# Patient Record
Sex: Male | Born: 1950 | Race: White | Hispanic: No | Marital: Married | Smoking: Former smoker
Health system: Southern US, Community
[De-identification: ages and names within clinical notes are randomized; demographics above are authoritative.]

## PROBLEM LIST (undated history)

## (undated) DIAGNOSIS — I219 Acute myocardial infarction, unspecified: Secondary | ICD-10-CM

## (undated) DIAGNOSIS — J40 Bronchitis, not specified as acute or chronic: Secondary | ICD-10-CM

## (undated) DIAGNOSIS — G8929 Other chronic pain: Secondary | ICD-10-CM

## (undated) DIAGNOSIS — D369 Benign neoplasm, unspecified site: Secondary | ICD-10-CM

## (undated) DIAGNOSIS — N201 Calculus of ureter: Secondary | ICD-10-CM

## (undated) DIAGNOSIS — M479 Spondylosis, unspecified: Secondary | ICD-10-CM

## (undated) DIAGNOSIS — M549 Dorsalgia, unspecified: Secondary | ICD-10-CM

## (undated) DIAGNOSIS — IMO0001 Reserved for inherently not codable concepts without codable children: Secondary | ICD-10-CM

## (undated) DIAGNOSIS — G459 Transient cerebral ischemic attack, unspecified: Secondary | ICD-10-CM

## (undated) DIAGNOSIS — K648 Other hemorrhoids: Secondary | ICD-10-CM

## (undated) DIAGNOSIS — I10 Essential (primary) hypertension: Secondary | ICD-10-CM

## (undated) DIAGNOSIS — I2510 Atherosclerotic heart disease of native coronary artery without angina pectoris: Secondary | ICD-10-CM

## (undated) DIAGNOSIS — K439 Ventral hernia without obstruction or gangrene: Secondary | ICD-10-CM

## (undated) DIAGNOSIS — E785 Hyperlipidemia, unspecified: Secondary | ICD-10-CM

## (undated) DIAGNOSIS — M8580 Other specified disorders of bone density and structure, unspecified site: Secondary | ICD-10-CM

## (undated) DIAGNOSIS — K219 Gastro-esophageal reflux disease without esophagitis: Secondary | ICD-10-CM

## (undated) HISTORY — DX: Hyperlipidemia, unspecified: E78.5

## (undated) HISTORY — PX: BACK SURGERY: SHX140

## (undated) HISTORY — DX: Other specified disorders of bone density and structure, unspecified site: M85.80

## (undated) HISTORY — DX: Essential (primary) hypertension: I10

## (undated) HISTORY — DX: Benign neoplasm, unspecified site: D36.9

## (undated) HISTORY — DX: Other hemorrhoids: K64.8

## (undated) HISTORY — DX: Gastro-esophageal reflux disease without esophagitis: K21.9

---

## 1998-08-17 ENCOUNTER — Ambulatory Visit (HOSPITAL_COMMUNITY): Admission: RE | Admit: 1998-08-17 | Discharge: 1998-08-17 | Payer: Self-pay | Admitting: *Deleted

## 2000-07-30 ENCOUNTER — Encounter: Payer: Self-pay | Admitting: Neurosurgery

## 2000-07-31 ENCOUNTER — Inpatient Hospital Stay (HOSPITAL_COMMUNITY): Admission: AD | Admit: 2000-07-31 | Discharge: 2000-08-01 | Payer: Self-pay | Admitting: Neurosurgery

## 2001-05-19 ENCOUNTER — Encounter: Payer: Self-pay | Admitting: Internal Medicine

## 2001-05-19 ENCOUNTER — Encounter: Admission: RE | Admit: 2001-05-19 | Discharge: 2001-05-19 | Payer: Self-pay | Admitting: Internal Medicine

## 2001-06-17 ENCOUNTER — Encounter: Payer: Self-pay | Admitting: Internal Medicine

## 2001-06-17 ENCOUNTER — Encounter: Admission: RE | Admit: 2001-06-17 | Discharge: 2001-06-17 | Payer: Self-pay | Admitting: Internal Medicine

## 2002-10-07 ENCOUNTER — Encounter: Payer: Self-pay | Admitting: Internal Medicine

## 2002-10-07 ENCOUNTER — Encounter: Admission: RE | Admit: 2002-10-07 | Discharge: 2002-10-07 | Payer: Self-pay | Admitting: Internal Medicine

## 2003-12-09 ENCOUNTER — Ambulatory Visit (HOSPITAL_COMMUNITY): Admission: RE | Admit: 2003-12-09 | Discharge: 2003-12-09 | Payer: Self-pay | Admitting: *Deleted

## 2003-12-09 ENCOUNTER — Encounter (INDEPENDENT_AMBULATORY_CARE_PROVIDER_SITE_OTHER): Payer: Self-pay | Admitting: *Deleted

## 2003-12-30 ENCOUNTER — Ambulatory Visit (HOSPITAL_COMMUNITY): Admission: RE | Admit: 2003-12-30 | Discharge: 2003-12-30 | Payer: Self-pay | Admitting: General Surgery

## 2004-01-19 ENCOUNTER — Emergency Department (HOSPITAL_COMMUNITY): Admission: EM | Admit: 2004-01-19 | Discharge: 2004-01-19 | Payer: Self-pay | Admitting: Emergency Medicine

## 2006-05-10 ENCOUNTER — Ambulatory Visit: Payer: Self-pay | Admitting: Cardiovascular Disease

## 2006-05-21 DIAGNOSIS — G459 Transient cerebral ischemic attack, unspecified: Secondary | ICD-10-CM

## 2006-05-21 HISTORY — DX: Transient cerebral ischemic attack, unspecified: G45.9

## 2006-07-08 ENCOUNTER — Ambulatory Visit: Payer: Self-pay | Admitting: Cardiovascular Disease

## 2006-09-19 ENCOUNTER — Ambulatory Visit: Payer: Self-pay | Admitting: Cardiovascular Disease

## 2007-02-01 ENCOUNTER — Encounter: Admission: RE | Admit: 2007-02-01 | Discharge: 2007-02-01 | Payer: Self-pay | Admitting: Internal Medicine

## 2007-05-01 ENCOUNTER — Observation Stay (HOSPITAL_COMMUNITY): Admission: EM | Admit: 2007-05-01 | Discharge: 2007-05-02 | Payer: Self-pay | Admitting: Emergency Medicine

## 2007-05-01 ENCOUNTER — Ambulatory Visit: Payer: Self-pay | Admitting: Vascular Surgery

## 2007-05-01 ENCOUNTER — Encounter (INDEPENDENT_AMBULATORY_CARE_PROVIDER_SITE_OTHER): Payer: Self-pay | Admitting: Internal Medicine

## 2007-07-16 ENCOUNTER — Ambulatory Visit: Payer: Self-pay | Admitting: Cardiovascular Disease

## 2007-07-24 ENCOUNTER — Ambulatory Visit: Payer: Self-pay | Admitting: Cardiovascular Disease

## 2007-07-24 ENCOUNTER — Ambulatory Visit (HOSPITAL_COMMUNITY): Admission: RE | Admit: 2007-07-24 | Discharge: 2007-07-24 | Payer: Self-pay | Admitting: Cardiovascular Disease

## 2008-07-13 ENCOUNTER — Inpatient Hospital Stay (HOSPITAL_COMMUNITY): Admission: RE | Admit: 2008-07-13 | Discharge: 2008-07-16 | Payer: Self-pay | Admitting: Neurosurgery

## 2008-07-29 ENCOUNTER — Ambulatory Visit: Payer: Self-pay | Admitting: Internal Medicine

## 2008-11-05 ENCOUNTER — Ambulatory Visit: Payer: Self-pay | Admitting: Internal Medicine

## 2008-11-30 DIAGNOSIS — E785 Hyperlipidemia, unspecified: Secondary | ICD-10-CM | POA: Insufficient documentation

## 2008-11-30 DIAGNOSIS — M1990 Unspecified osteoarthritis, unspecified site: Secondary | ICD-10-CM | POA: Insufficient documentation

## 2008-11-30 DIAGNOSIS — I10 Essential (primary) hypertension: Secondary | ICD-10-CM | POA: Insufficient documentation

## 2008-11-30 DIAGNOSIS — E7800 Pure hypercholesterolemia, unspecified: Secondary | ICD-10-CM | POA: Insufficient documentation

## 2008-12-01 ENCOUNTER — Ambulatory Visit: Payer: Self-pay | Admitting: Cardiovascular Disease

## 2008-12-02 ENCOUNTER — Encounter: Payer: Self-pay | Admitting: Cardiovascular Disease

## 2008-12-03 ENCOUNTER — Ambulatory Visit: Payer: Self-pay | Admitting: Internal Medicine

## 2009-02-07 ENCOUNTER — Telehealth: Payer: Self-pay | Admitting: Cardiovascular Disease

## 2009-03-04 ENCOUNTER — Encounter: Admission: RE | Admit: 2009-03-04 | Discharge: 2009-03-04 | Payer: Self-pay | Admitting: Internal Medicine

## 2009-06-14 ENCOUNTER — Ambulatory Visit: Payer: Self-pay | Admitting: Internal Medicine

## 2009-10-06 ENCOUNTER — Encounter (INDEPENDENT_AMBULATORY_CARE_PROVIDER_SITE_OTHER): Payer: Self-pay | Admitting: *Deleted

## 2010-01-11 ENCOUNTER — Ambulatory Visit: Payer: Self-pay | Admitting: Cardiovascular Disease

## 2010-01-11 DIAGNOSIS — R41840 Attention and concentration deficit: Secondary | ICD-10-CM | POA: Insufficient documentation

## 2010-06-18 LAB — CONVERTED CEMR LAB
ALT: 32 units/L (ref 0–53)
Albumin: 4.3 g/dL (ref 3.5–5.2)
Basophils Absolute: 0 10*3/uL (ref 0.0–0.1)
Basophils Relative: 0.5 % (ref 0.0–3.0)
Bilirubin, Direct: 0.2 mg/dL (ref 0.0–0.3)
CO2: 28 meq/L (ref 19–32)
Calcium: 9.5 mg/dL (ref 8.4–10.5)
Eosinophils Relative: 2.8 % (ref 0.0–5.0)
HCT: 40 % (ref 39.0–52.0)
HDL: 38.4 mg/dL — ABNORMAL LOW (ref >39.00)
LDL Cholesterol: 69 mg/dL (ref 0–99)
MCHC: 34 g/dL (ref 30.0–36.0)
Monocytes Relative: 12.1 % — ABNORMAL HIGH (ref 3.0–12.0)
Neutrophils Relative %: 48 % (ref 43.0–77.0)
PSA: 0.51 ng/mL (ref 0.10–4.00)
RBC: 4.07 M/uL — ABNORMAL LOW (ref 4.22–5.81)
Sodium: 140 meq/L (ref 135–145)
Total CHOL/HDL Ratio: 4
WBC: 6.2 10*3/uL (ref 4.5–10.5)

## 2010-06-20 NOTE — Letter (Signed)
Summary: Appointment - Reminder 2  [employer], Main Office  [address]   [place], [place] [address]   Phone: [phone]  Fax: [phone]     Oct 06, 2009 MRN: [id number]   Johnny [name] [address] [place], [place]  [address]   Dear [name],  Our records indicate that it is time to schedule a follow-up appointment with Dr. Eden Emms. It is very important that we reach you to schedule this appointment. We look forward to participating in your health care needs. Please contact us at the number listed above at your earliest convenience to schedule your appointment.  If you are unable to make an appointment at this time, give [place] a call so we can update our records.     Sincerely,  Migdalia Dk [facility] Scheduling Team

## 2010-06-20 NOTE — Assessment & Plan Note (Signed)
Summary: per check out/sf  Medications Added CELEXA 20 MG TABS (CITALOPRAM HYDROBROMIDE) 1 tab by mouth once daily TESTIM 1 % GEL (TESTOSTERONE) as directed MULTIVITAMINS   TABS (MULTIPLE VITAMIN) 1 tab by mouth once daily B-COMPLEX/B-12  LIQD (B COMPLEX VITAMINS) daliy ACTONEL 150 MG TABS (RISEDRONATE SODIUM) monthly      Allergies Added: NKDA  Primary Provider:  Sharlet Salina MD  CC:  check up.  History of Present Illness: Johnny [name] seen today in followup for hypertension and hypercholesterolemia.  Previous question of a TIA.  I did a transesophageal echocardiogram on him in March of 2009 there was no PFO.  He septally saw Dr. 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  The patient followed up with Dr. Channing Mutters and had his lower back surgery.  Seems to have helped him.  He has been compliant with his blood pressure and cholesterol pills.  He has not had any significant chest pain PND or orthopnea.  There is no documented coronary artery disease.  His weight has been stable.  His primary care physician is Dr. Lenord [name].  He requested lab work today since he is fasting.  We discussed a low carb diet.  He seems to think he has ADD.  We discussed Ritalin and I advised him to be formerly tested by a psychologist and if he needs meds a nonstimulant alternative would be better.  I congratulated him on his sons graduation from Medical laboratory scientific officer ac[employer].  Current Problems (verified): 1)  Degenerative Joint Disease  (ICD-715.90) 2)  Dyslipidemia  (ICD-272.4) 3)  Tia  (ICD-435.9) 4)  Hypercholesterolemia  (ICD-272.0) 5)  Hypertension  (ICD-401.9)  Current Medications (verified): 1)  Crestor 10 Mg Tabs (Rosuvastatin Calcium) .... Take One Tablet By Mouth Daily. 2)  Hyzaar 100-12.5 Mg Tabs (Losartan Potassium-Hctz) .Marland Kitchen.. 1 Tab By Mouth Once Daily 3)  Neurontin 600 Mg Tabs (Gabapentin) .... As Needed 4)  Aspirin Ec 325 Mg Tbec (Aspirin) .... Take One Tablet By Mouth Daily 5)  Percocet  10-325 Mg Tabs (Oxycodone-Acetaminophen) .... As Needed 6)  Flexeril 10 Mg Tabs (Cyclobenzaprine Hcl) .... As Needed 7)  Celexa 20 Mg Tabs (Citalopram Hydrobromide) .Marland Kitchen.. 1 Tab By Mouth Once Daily 8)  Testim 1 % Gel (Testosterone) .... As Directed 9)  Multivitamins   Tabs (Multiple Vitamin) .Marland Kitchen.. 1 Tab By Mouth Once Daily 10)  B-Complex/b-12  Liqd (B Complex Vitamins) .... Daliy 11)  Actonel 150 Mg Tabs (Risedronate Sodium) .... Monthly  Allergies (verified): No Known Drug Allergies  Past History:  Past Medical History: Last updated: 11/30/2008 Current Problems:  DEGENERATIVE JOINT DISEASE (ICD-715.90) DYSLIPIDEMIA (ICD-272.4) TIA (ICD-435.9) HYPERCHOLESTEROLEMIA (ICD-272.0) HYPERTENSION (ICD-401.9)  Past Surgical History: Last updated: 11/30/2008 fusion oflumbar spine   Family History: Last updated: 12/01/2008 non-contriibutory  Social History: Last updated: 11/30/2008  He lives with his wife and two children, a son and a   daughter.  He used to smoke but quit more than 30 years ago.  He drinks   alcohol occasionally.  He retired from the [employer] two weeks   ago.  Patient denies any history of recent traveling.   Review of Systems       Denies fever, malais, weight loss, blurry vision, decreased visual acuity, cough, sputum, SOB, hemoptysis, pleuritic pain, palpitaitons, heartburn, abdominal pain, melena, lower extremity edema, claudication, or rash.   Vital Signs:  Patient profile:   60 year old male Height:      72 inches Weight:  229 pounds BMI:     31.17 Pulse rate:   67 / minute Resp:     14 per minute BP sitting:   126 / 80  (left arm)  Vitals Entered By: Kem Parkinson (January 11, 2010 8:50 AM)  Physical Exam  General:  Affect appropriate Healthy:  appears stated age HEENT: normal Neck supple with no adenopathy JVP normal no bruits no thyromegaly Lungs clear with no wheezing and good diaphragmatic motion Heart:  S1/S2 no murmur,rub,  gallop or click PMI normal Abdomen: benighn, BS positve, no tenderness, no AAA no bruit.  No HSM or HJR Distal pulses intact with no bruits No edema Neuro non-focal Skin warm and dry    Impression & Recommendations:  Problem # 1:  DYSLIPIDEMIA (ICD-272.4)  Continue statin labs today His updated medication list for this problem includes:    Crestor 10 Mg Tabs (Rosuvastatin calcium) .Marland Kitchen... Take one tablet by mouth daily.  Orders: TLB-Lipid Panel (80061-LIPID)  His updated medication list for this problem includes:    Crestor 10 Mg Tabs (Rosuvastatin calcium) .Marland Kitchen... Take one tablet by mouth daily.  Problem # 2:  TIA (ICD-435.9) No recurrence ? complex migraine.  Continue ASA  Problem # 3:  HYPERTENSION (ICD-401.9)  Well controlled continue low sodium diet His updated medication list for this problem includes:    Hyzaar 100-12.5 Mg Tabs (Losartan potassium-hctz) .Marland Kitchen... 1 tab by mouth once daily    Aspirin Ec 325 Mg Tbec (Aspirin) .Marland Kitchen... Take one tablet by mouth daily  Orders: TLB-BMP (Basic Metabolic Panel-BMET) (80048-METABOL) TLB-CBC Platelet - w/Differential (85025-CBCD) TLB-Hepatic/Liver Function Pnl (80076-HEPATIC) TLB-A1C / Hgb A1C (Glycohemoglobin) (83036-A1C) TLB-PSA (Prostate Specific Antigen) (84153-PSA)  His updated medication list for this problem includes:    Hyzaar 100-12.5 Mg Tabs (Losartan potassium-hctz) .Marland Kitchen... 1 tab by mouth once daily    Aspirin Ec 325 Mg Tbec (Aspirin) .Marland Kitchen... Take one tablet by mouth daily  Problem # 4:  ATTENTION OR CONCENTRATION DEFICIT (ICD-799.51) Advised him to see his wife's psychologist. (She has MS)  Would not advise ritalin given Rx for BP  Patient Instructions: 1)  Your physician recommends that you schedule a follow-up appointment in: one year 2)  Your physician recommends that you return for lab work in:CBC,CMET,LFT'S,LIPIDS,PSA,HgbA1c   EKG Report  Procedure date:  01/11/2010  Findings:      NSR 67 Normal ECG

## 2010-09-05 LAB — DIFFERENTIAL
Basophils Absolute: 0.1 10*3/uL (ref 0.0–0.1)
Eosinophils Absolute: 0.1 10*3/uL (ref 0.0–0.7)
Lymphs Abs: 2.4 10*3/uL (ref 0.7–4.0)
Neutrophils Relative %: 55 % (ref 43–77)

## 2010-09-05 LAB — COMPREHENSIVE METABOLIC PANEL
ALT: 36 U/L (ref 0–53)
AST: 26 U/L (ref 0–37)
BUN: 9 mg/dL (ref 6–23)
Calcium: 10.4 mg/dL (ref 8.4–10.5)
Chloride: 107 mEq/L (ref 96–112)
GFR calc non Af Amer: >60 mL/min (ref >60)
Potassium: 4.2 mEq/L (ref 3.5–5.1)
Total Bilirubin: 1 mg/dL (ref 0.3–1.2)

## 2010-09-05 LAB — TYPE AND SCREEN
ABO/RH(D): B POS
Antibody Screen: NEGATIVE

## 2010-09-05 LAB — URINALYSIS, ROUTINE W REFLEX MICROSCOPIC
Glucose, UA: NEGATIVE mg/dL
Nitrite: NEGATIVE
Specific Gravity, Urine: 1.016 (ref 1.005–1.030)
pH: 7 (ref 5.0–8.0)

## 2010-09-05 LAB — PROTIME-INR
INR: 0.9 (ref 0.00–1.49)
Prothrombin Time: 12.6 seconds (ref 11.6–15.2)

## 2010-09-05 LAB — ABO/RH: ABO/RH(D): B POS

## 2010-09-05 LAB — CBC
HCT: 46 % (ref 39.0–52.0)
RBC: 4.8 MIL/uL (ref 4.22–5.81)
RDW: 13.1 % (ref 11.5–15.5)
WBC: 7.4 10*3/uL (ref 4.0–10.5)

## 2010-10-03 NOTE — Discharge Summary (Signed)
Johnny [name], Johnny [name]              ACCOUNT NO.:  [id number]   MEDICAL RECORD NO.:  [id number]          PATIENT TYPE:  INP   LOCATION:  3030                         FACILITY:  [facility]   PHYSICIAN:  Isidor Holts, M.D.  DATE OF BIRTH:  12-06-1950   DATE OF ADMISSION:  04/30/2007  DATE OF DISCHARGE:  05/02/2007                               DISCHARGE SUMMARY   PRIMARY CARE PHYSICIAN:  Luanna Cole. Lenord Fellers, MD.   DISCHARGE DIAGNOSES:  1. Transient ischemic attack.  2. Hypertension.  3. Dyslipidemia.  4. Degenerative joint disease, lumbar spine.   DISCHARGE MEDICATIONS:  1. Crestor 20 mg p.o. daily.  2. Aspirin 325 mg p.o. daily (Was on 81 mg p.o. daily).  3. Hyzaar (100/12.5) one p.o. daily.  4. Ibuprofen 800 mg p.o. p.r.n. q.6h.  5. Diazepam 5 to 10 mg p.o. p.r.n. q.h.s.   PROCEDURES:  1. Head CT scan dated April 30, 2007:  This showed no evidence of      acute intracranial abnormality.  There was mild generalized      cerebral and cerebellar atrophy.  2. Brain MRI dated May 01, 2007:  This showed no acute      intracranial abnormality.  3. Brain/neck MRA dated May 01, 2007:  This showed no variant MRA      circle of Willis, minimal atherosclerotic irregularity, the      proximal left internal carotid artery.  This had significant      stenosis.  Otherwise, unremarkable MRA of neck.  4. Carotid/vertebral duplex scan dated May 01, 2007:  This showed      no evidence of significant plaque bilaterally, high bifurcation,      vertebral arteries low antegrade bilaterally, no significant right      ICD stenosis noted, no significant left ICD stenosis noted.  5. Two-D echocardiogram dated May 01, 2007:  This showed overall      normal left ventricular systolic function, EF 55% to 60%, no left      ventricular regional wall motion abnormalities, doppler parameters      were consistent with normal left ventricular relaxation, aortic      valve was mildly increased,  there was no echocardiographic evidence      for intracardiac source of embolism.  6. EEG dated May 02, 2007:  This was a normal EEG in the waking      state. No evidence of ictal or interictal discharges were recorded.   CONSULTATIONS:  None.   ADMISSION HISTORY:  As in the H&P notes of April 30, 2007, dictated  by Dr. Ebony Cargo.  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  He was admitted for further evaluation, investigation, and  management.   CLINICAL COURSE:  1. TIA.  The patient presents with transient focal neurologic      phenomena as outlined above.  At the time of evaluation in the  emergency department, symptoms had resolved.  The patient was      admitted for TIA/CVA workup, which included head CT scan, brain      MRI/MRA, bilateral carotid/vertebral artery duplex scans, two-D      echocardiogram, lipid profile, homocystine levels.  ESR was normal      at 3 mm per hour.  For details of findings, refer to procedure list      above; however, essentially his tests were all negative.  The      patient did experience a further episode of transient dysphasia on      emerging from the MRI Department, however, in the past 24 hours      following this, no further episodes were recorded.  Pre-admission,      he was on 81 mg daily of Aspirin.  This has been increased to 325      mg daily.  EEG was done on May 02, 2007, although his symptoms      were inconsistent with seizure disorder.  EEG was negative.   1. Dyslipidemia.  The patient's lipid profile was as follows:  Total      cholesterol 156, triglycerides 162, HDL 36, LDL 88, i.e., excellent      lipid profile.  The patient has been reassured accordingly and has      been  recommended to continue his current dose of Statin.      Incidentally, the TSH was normal at 0.637.   1. Hypertension.  The patient remained normotensive throughout the      course of his hospitalization.  As a matter of fact, on May 02, 2007, BP was 128/73 mmHg.   1. DJD, lumbar spine.  The patient has reportedly been off Aspirin for      a few days, because of scheduled epidural injection, which was to      be done on May 01, 2007, by Dr. Trey Sailors.  Unfortunately, this      procedure had to be postponed secondary to current hospitalization      and the need for patient to be back on Aspirin.  Back pain,      however, did not prove problematic, during the course of this      hospitalization.  We anticipate that the patient will follow up      routinely with Dr. Channing Mutters, following discharge.   DISPOSITION:  The patient on May 02, 2007, considered clinically  stable for discharge and has been discharged accordingly.   DIET:  A heart healthy diet.   ACTIVITY:  As tolerated.   FOLLOWUP INSTRUCTIONS:  The patient is to recommended to follow up with  Dr. Luanna Cole. Baxley, primary MD, within one week of discharge.  He has  been instructed to call for an appointment.      Isidor Holts, M.D.  Electronically Signed     CO/MEDQ  D:  05/02/2007  T:  05/03/2007  Job:  [id number]   cc:   Luanna Cole. Lenord Fellers, M.D.  Johnny [name], M.D.

## 2010-10-03 NOTE — Assessment & Plan Note (Signed)
Johnny [name]                            CARDIOLOGY OFFICE NOTE   Johnny [name], Johnny [name]                     MRN:          [id number]  DATE:07/16/2007                            DOB:          [birth year]    Johnny [name] returns today for follow-up of hypertension and chest pain.  In  the past has also had hypercholesterolemia.   The patient has been through quite a lot since I last saw him in May.  He initially had disk problems in his back that he has seen Dr. Channing [name] for.  He had some steroid injections for this with some improvement.  About 10  days after second injection,  he had a fairly significant TIA like  symptoms.  Read through all his hospital notes from [facility].  He had  scintillations in his left eye.  There is a question of a complex  migraine.  He went to [facility] to see his daughter, who just had  a baby, and subsequently was transferred to [facility].  He had an extensive  workup and was seen by neurology.  His CT and MRA were essentially  unremarkable.  He had normal carotids.  He did not have a TEE or  transcranial Dopplers that I could see.  His blood work was normal with  a normal sed rate and he has a normal EEG   In talking to the patient, he had no other problems.  He seems to have  resolved back to normal.  There was no difficulty moving his arms and  legs.  His speech may have been off a little bit.  Total duration of the  episode was less than an hour.   He had been off his aspirin for 10 days prior to it.  He was discharged  without medication changes.  His last LDL cholesterol was 73 with diet  therapy.  He was on blood pressure medicine and was not sent home on  Aggrenox or Plavix.   His review of systems is remarkable for no recurrent TIA or CVAs and  improvement in his lower back pain.  Otherwise negative.   He is on Hyzaar 100/12.5 a day, aspirin a day, multivitamins.   EXAM:  Remarkable for an overweight but  healthy-appearing middle-aged  white male in no distress.  Weight is 235, blood pressure 135/84, pulse 89 regular, afebrile,  respiratory rate 14.  HEENT:  Unremarkable.  Carotids are without bruit, no lymphadenopathy, thyromegaly JVP  elevation.  Pupils were equal, round, reactive to light and accommodation.  There is  no drift no nystagmus.  Neck is supple.  No lymphadenopathy, thyromegaly JVP elevation.  LUNGS:  Clear diaphragmatic motion.  No wheezing.  S1-S2 normal heart sounds PMI normal.  ABDOMEN:  Benign.  Bowel sounds positive.  No bruit, no AAA.  No  hepatosplenomegaly or hepatojugular reflux.  Distal pulse intact, no edema.  NEURO:  Nonfocal.  SKIN:  Warm and dry.   EKG is normal sinus rhythm with isolated Q wave in lead III.   IMPRESSION:  1. Question TIA versus complex migraine the patient should have  a TE      to rule out PFO right-to-left shunt.  We will arrange this as an      outpatient.  Apparently Dr. Pearlean [name] did not think Aggrenox was in      order.  The patient did not have any palpitations or irregular      heartbeats noticed during the time of this and did not do not think      that paroxysmal atrial fibrillation had a role here.  Do not see      any indication for Coumadin.  2. Hypertension currently well controlled.  Continue low-salt diet.      Hyzaar current dose seems to well-controlled.  3. Previous hypercholesterolemia seems improved with diet therapy.  No      indication for statin drug.  May be worthwhile to check a C-      reactive protein and particle sizes with lipomet in the future to      make sure he does not need lipid therapy in terms of a possible      vascular disease with a recent TIA.  Further recommendations based      on results of his TEE.  4. Next back problems.  Follow-up with Dr. Channing [name].  Temporal relationship      between steroids actions and eye findings.  May need to be careful      with this in regards to future treatment.      Johnny Pick. Eden Emms, MD, [facility]  Electronically Signed    PCN/MedQ  DD: 07/16/2007  DT: 07/17/2007  Job #: [id number]

## 2010-10-03 NOTE — Op Note (Signed)
NAME:  Johnny [name], Johnny [name]              ACCOUNT NO.:  [id number]   MEDICAL RECORD NO.:  [id number]          PATIENT TYPE:  AMB   LOCATION:  ENDO                         FACILITY:  [facility]   PHYSICIAN:  Peter C. Eden Emms, MD, FACCDATE OF BIRTH:  1950/10/05   DATE OF PROCEDURE:  07/24/2007  DATE OF DISCHARGE:                               OPERATIVE REPORT   TRANSESOPHAGEAL ECHO:   INDICATION:  TIA, question complex migraine.   The patient received 100 mcg of fentanyl and 8 mg of Versed.  Using  digital technique, an Omniplane probe was advanced in the distal  esophagus without incident.  Transgastric imaging revealed normal LV  function.  The EF was 70%.  There was no regional wall motion  abnormality, no apical thrombus.  Mitral, aortic and tricuspid valves  are structurally normal.  There was trivial mitral insufficiency.  There  was no ASD or VSD.  Left atrium and right-sided cardiac chambers were  normal.  There was no left atrial appendage clot.  Bubble study was  negative for right-to-left shunt.  There was no left atrial appendage  thrombus and the aorta showed no significant debris.   IMPRESSION:  1. No source of embolus.  2. Negative bubble study.  3. No left atrial appendage thrombus.  4. No atrial septal defect or ventricular septal defect.  5. Normal left ventricular function, ejection fraction 70%.  6. Normal cardiac valves except for trivial mitral regurgitation.  7. No aortic debris.   The patient tolerated the procedure well.      [name], MD, [facility]  Electronically Signed     PCN/MEDQ  D:  07/24/2007  T:  07/24/2007  Job:  [id number]   cc:   Pramod P. Pearlean Brownie, MD

## 2010-10-03 NOTE — Discharge Summary (Signed)
NAMETRAJAN, GROVE NO.:  [id number]   MEDICAL RECORD NO.:  [id number]          PATIENT TYPE:  INP   LOCATION:  3033                         FACILITY:  [facility]   PHYSICIAN:  Payton Doughty, M.D.      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   PROCEDURES:  L4-L5 laminectomy, diskectomy, partial lumbar interbody  fusion, nonsegmental pedicle screws and posterolateral arthrodesis.   DICTATING DOCTOR:  Payton Doughty, MD., Neurosurgery.   COMPLICATIONS:  None.   DISCHARGE STATUS:  Alive and well.   BODY OF THE TEXT:  This is a 60 year old right-handed white gentleman  whose history and physical is recounted in the chart, had L4-L5 disk  disease for sometime and increasing pain, positive discography.  He was  admitted for fusion.   MEDICAL HISTORY:  Benign.  He has hypertension and hyperlipidemia.   ALLERGIES:  None.   PHYSICAL EXAMINATION:  GENERAL:  Intact.  NEUROLOGIC:  Intact.   He was admitted after ascertaining normal laboratory values and  underwent L4-L5 fusion.  Postoperatively, he has done well.  Foley was  removed on second postop day.  PCA was stopped on third postop day and  is being discharged home now.  He participated in physical therapy, to  good avail and is up and about.  His incisions are dry and well healing.  He is being discharged home in the care of his family.  Followup will in  the [facility] in about 10 days for suture removal.  He is being  discharged home on ciprofloxacin and Percocet.           ______________________________  Payton Doughty, M.D.     MWR/MEDQ  D:  07/16/2008  T:  07/17/2008  Job:  [id number]

## 2010-10-03 NOTE — Op Note (Signed)
NAMESTEPHON, Johnny [name] NO.:  [id number]   MEDICAL RECORD NO.:  [id number]          PATIENT TYPE:  INP   LOCATION:  3033                         FACILITY:  [facility]   PHYSICIAN:  Payton Doughty, M.D.      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   POSTOPERATIVE DIAGNOSIS:  Spondylosis at L4-5.   OPERATIVE PROCEDURES:  L4-5 laminectomy, diskectomy, posterior lumbar  antibody fusion with PEEK cages, nonsegmental pedicle screw fixation at  L4-5, and posterolateral arthrodesis L4-5.   SURGEON:  Payton Doughty, MD, Neurosurgery.   ANESTHESIA:  General endotracheal.   PREPARATION:  Prepped and draped with alcohol wipe.   COMPLICATIONS:  None.   NURSE ASSISTANT:  Kasik.   DOCTOR ASSISTANT:  Stefani Dama, MD.   BODY OF TEXT:  This is a 60 year old gentleman with probable recurrent  herniated disk in the lateral position at L4-5 on the right side.  Taken  to the operating room, smoothly anesthetized and intubated, placed prone  on the operating table following shave, prep, and drape in the usual  sterile fashion.  Skin was infiltrated with 1% lidocaine with 1:400,000  epinephrine and the skin was incised from the bottom of L3 to the top of  L5 and the lamina of L4 and the transverse process of L4 and L5 were  exposed bilaterally in subperiosteal plane.  Intraoperative x-ray  confirmed correctness of level.  The pars reticularis remaining lamina  inferior facet of L4 and superior facet of L5 was removed using the high-  speed drill and [place].  This allowed decompression of lateral  recesses, removal of ligamentum flavum, decompression of the neural  foramen, and removal of the disk at L4-5.  On the right side, he was  found to have a herniated disk in the foraminal position compressing the  right L4 root.  Following diskectomy and  complete decompression of the  nerve roots, PEEK cages 12-mm tall were packed with bone graft harvested  from the facet joints and after end-plate preparation were tapped into  place.  Pedicle screws were then placed using the standard landmarks.  Intraoperative x-ray showed good position of cages and pedicle screws  that were connected with the rod and the locking cap was applied and  tightened.  The transverse process of L4 and L5 were decorticated  with a high-speed drill and packed with BMP on the extender matrix.  Final x-ray showed good placement of all hardware.  Successive layers of  0 Vicryl, 2-0 Vicryl, 3-0 nylon were used to close.  Betadine and Telfa  dressing was applied, made occlusive with OpSite, and the patient  returned to the recovery room in good condition.           ______________________________  Payton Doughty, M.D.     MWR/MEDQ  D:  07/13/2008  T:  07/14/2008  Job:  [id number]

## 2010-10-03 NOTE — H&P (Signed)
NAME:  Johnny [name], Johnny [name]              ACCOUNT NO.:  [id number]   MEDICAL RECORD NO.:  [id number]          PATIENT TYPE:  EMS   LOCATION:  MAJO                         FACILITY:  [facility]   PHYSICIAN:  Hind I Eda Paschal, MD      DATE OF BIRTH:  June 05, 1950   DATE OF ADMISSION:  04/30/2007  DATE OF DISCHARGE:                              HISTORY & PHYSICAL   PRIMARY CARE PHYSICIAN:  Luanna Cole. Baxley, M.D.   CHIEF COMPLAINT:  Slurred speech for 45 minutes and headache.   HISTORY OF PRESENT ILLNESS:  This is a 60 year old gentleman with a  history of hypertension, hypercholesterolemia.  Patient is compliant  with his medications.  The patient admitted today with a history of  slurred speech.  History mainly taken from the patient.  According to  the patient, he was driving his Zenaida Niece from [employer] to [place].  At that time, he felt some zigzag line, mainly on his left eye,  associated with lightheadedness, which is strange for the patient.  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  At  that time, he was talking to his wife.  His wife noticed some slurred  speech.  The patient could not comprehend what the wife was asking.  Patient had to lie down on a sofa.  At that time, EMS arrived.  Patient's vital signs were completely normal.  According to the patient,  those symptoms lasted about 45 to one hour.   At this time, patient denies any slurred speech, and he was  comprehendible but still complaining of headache, mainly at both temples  and at the occipital area.  Patient denies any blurring of vision.  Denies any numbness or weakness of his extremities.  Although the  patient has noticed some numbness of his left little finger, which  lasted about a half hour, which is completely resolved at this time.  The patient denies any fever.  He denies any cough.  He denies any neck  stiffness.  He denies any seizure activity.  He denies any drooping of  saliva or food  bucketing on his cheek.  Denies any urine incontinence.  The patient denies any shortness of breath or chest pain.  He denies any  abdominal pain.  The patient had an episode of nausea after receiving  Dilaudid with one episode of vomiting of clear liquid fluid.   PAST MEDICAL HISTORY:  1. Hypertension.  2. Hyperlipidemia.  3. History of back surgery.  Patient is supposed to undergo a steroid      injection tomorrow, Dr. Trey Sailors, for herniated disk at L4-5.  For      that, aspirin was being stopped.   CURRENT MEDICATIONS:  1. Hyzaar.  2. Crestor 20 mg p.o. nightly.  3. Demerol p.r.n.  4. Aspirin, which was stopped by Dr. Channing Mutters for evaluation of a steroid      injection on his back.   ALLERGIES:  No known drug allergies.   PAST SURGICAL HISTORY:  1. Status post laser destruction of anal condylomata.  2. L4-5 laminectomy and discectomy.  3. Status post hand surgery.   FAMILY HISTORY:  Mother died of heart attack at age 69.  Father died  with COPD at age 21.   SOCIAL HISTORY:  He lives with his wife and two children, a son and a  daughter.  He used to smoke but quit more than 30 years ago.  He drinks  alcohol occasionally.  He retired from the [employer] two weeks  ago.  Patient denies any history of recent traveling.   PHYSICAL EXAMINATION:  VITAL SIGNS:  Temperature 98.3, blood pressure  174/91, pulse rate 80, respiratory rate 18, saturating 98% on room air.  HEENT:  Looks suitable for his age.  Not in respiratory distress or  shortness of breath.  During conversation, the patient had an episode of  nausea and one episode of vomiting clear liquid fluid.  His pupils are  equal and reactive to light and accommodation.  Extraocular muscle  movement within normal range.  Mucosa is moist.  NECK:  No lymphadenopathy.  No thyromegaly.  I could not appreciate any  carotid bruit.  No JVD.  HEART:  S1 and S2 with no added sound.  No murmurs or gallops.  LUNGS:  Normal fascicular  breathing.  Equal air entry.  ABDOMEN:  Distended.  Bowel sounds positive.  Nontender.  No  organomegaly.  EXTREMITIES:  Peripheral pulses are intact.  No edema.  No cyanosis.  There is a birthmark at the left lower extremities.  BACK:  No area of tenderness.  Surgical scar well healed at the  lumbosacral area.  CNS:  Cranial nerves from II-XII seem intact.  Power is 5/5 on both  upper and lower extremities.  Sensation is intact.  Reflexes are brisk  bilaterally.  Cerebellar sign was negative.  Gait was normal.   LABS:  CT of the head was negative for any acute intracranial pulses.   WBC 10, hemoglobin 15.2, hematocrit 45.  Sodium 138, potassium 4.1,  chloride 107, glucose 93, BUN 12, creatinine 1.2.   ASSESSMENT/PLAN:  This is a 60 year old male with a history of  hypertension, hyperlipidemia, which has seemed well controlled, admitted  to the hospital today with a chief complaint of an episode of zigzag  lines on his left eyes, associated with slurred speech and a period of  unable to comprehend people.  Differential diagnosis:  Transient  ischemic attack versus temporal arteritis.  The patient will be admitted  to neurology floor, get ESR, C-reactive protein, MRI of the brain, MRA  of brain and neck, carotid duplex, aspirin to be restarted, and will  consider consulting neurology in the morning.  Will continue with  Crestor for hyperlipidemia and get lipid profile.  Also will get  homocysteine level and stroke panel.  For blood pressure, will hold  Hyzaar today.  Most probably the patient has an episode of nausea and  vomiting, which seems complications of pain medicines received in the  emergency room.  DVT and GI prophylaxis.      Hind Bosie Helper, MD  Electronically Signed     HIE/MEDQ  D:  05/01/2007  T:  05/01/2007  Job:  [id number]

## 2010-10-03 NOTE — H&P (Signed)
Johnny [name], AMER NO.:  [id number]   MEDICAL RECORD NO.:  [id number]          PATIENT TYPE:  INP   LOCATION:  3033                         FACILITY:  [facility]   PHYSICIAN:  Payton Doughty, M.D.      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   BODY TEXT:  A very nice 60 year old right-handed white gentleman in 2002  had a L4-L5 disk, did well starting about 2 years ago had pain in his  right groin.  He had disks at L4-5.  He got some epidural steroid and  got a fair amount better.  Returned to my office and received another  steroid injection, but he still has pain in his back and down his right  leg.  He has been trying to return to work with this and is now admitted  for an L4-5 fusion.   MEDICAL HISTORY:  Very benign.  He has been treated for hypertension and  hyperlipidemia.   MEDICATIONS:  Hyzaar, Crestor, valium, Neurontin, and Flexeril.   ALLERGIES:  None.   SURGICAL HISTORY:  Diskectomy.   SOCIAL HISTORY:  He does not smoke, drinks on a rare social basis, and  is a IT sales professional.   FAMILY HISTORY:  Mother died at 80 with an MI and father died at 22 with  COPD.   REVIEW OF SYSTEMS:  Remarkable for hypertension, hypercholesterolemia,  leg pain, leg weakness, difficulty in urination, ingestion, back pain,  leg pain, arthritis, and neck pain.   PHYSICAL EXAMINATION:  HEENT:  Within normal limits.  NECK:  He has good range of motion of his neck.  CHEST:  Clear.  CARDIAC:  Regular rate and rhythm.  ABDOMEN:  Nontender.  No hepatosplenomegaly.  EXTREMITIES:  Without clubbing or cyanosis.  Feet exam is deferred.  Peripheral pulses are good.  NEUROLOGIC:  He is awake, alert, and oriented.  Cranial nerves are  intact.  Motor exam shows 5/5 strength throughout the upper and lower  extremities with give-away  weakness, hip flexors on the right side.  Sensory dysesthesias described in the right L4 distribution.  Reflexes  are 2 at the knees, 2 at the ankles.  Straight leg raise is negative.   MRI shows foraminal disk lateral to where his old disk was and  __________ from the disk at L5-1.   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   PLAN:  A 4-5 fusion.  Risks and benefits have been discussed with him,  and he wishes to proceed.           ______________________________  Payton Doughty, M.D.     MWR/MEDQ  D:  07/13/2008  T:  07/14/2008  Job:  [id number]

## 2010-10-03 NOTE — Procedures (Signed)
EEG NUMBER:  [id number].   HISTORY:  This is a 60 year old patient who was admitted with a TIA type  of event with slurred speech, visual disturbance.  The patient is being  evaluated for the above event.  This is a routine EEG.  No skull defects  were noted.  Medications include Phenergan, Lovenox, aspirin, Protonix,  Lipitor, Dilaudid, Tylenol and oxycodone.   EEG CLASSIFICATION:  Normal awake.   DESCRIPTION OF RECORDING:  Background rhythms of this recording consists  of fairly well modulated medium amplitude alpha rhythm of 9 Hz that is  reactive to eye being closed.  As the record progresses, the patient  appears to remain in a waking state throughout the recording.  Photic  stimulation is performed resulting in a bilateral and symmetric photic  drive response.  Hyperventilation is also performed and there is minimal  buildup of background rhythm activity with hyperventilation.  At no time  during the recording there did appear to be evidence of spike, spike  wave discharges or evidence of focal slowing.  EKG monitor shows no  evidence of cardiac rhythm abnormalities and a heart rate of 78.   IMPRESSION:  This is a normal EEG recording in a waking state.  No  evidence of ictal or interictal discharges were seen.      Marlan Palau, M.D.  Electronically Signed     YNW:GNFA  D:  05/02/2007 17:49:06  T:  05/04/2007 14:12:34  Job #:  [id number]

## 2010-10-06 NOTE — H&P (Signed)
[facility]  Patient:    Johnny [name], Johnny [name]                    MRN: [id number] Adm. Date:  07/30/00 Attending:  Payton Doughty, M.D.                         History and Physical  ADMISSION DIAGNOSIS: Herniated disk on the right side at L4-5.  HISTORY OF PRESENT ILLNESS: This patient is a 60 year old right-handed white male whose back has been troubling him since lifting a lady who had had a stroke on June 25, 2000.  Starting last week he began experiencing a lot of pain in his right groin going down his right leg.  Anti-inflammatories and parenteral pain medications did not help him much.  An MRI was obtained and showed a large herniated disk at L4-5 eccentric to the right side.  His bladder has not been effected.  PAST SURGICAL HISTORY: None.  CURRENT MEDICATIONS:  1. Norvasc 10 mg q.d. for hypertension.  2. Lipitor 10 mg q.d. for cholesterol.  3. Hydrochlorothiazide 12.5 mg q.d. for fluid retention.  4. Flexeril 10 mg t.i.d.  5. Demerol and Phenergan q.i.d. for leg pain.  ALLERGIES: No known drug allergies.  SOCIAL HISTORY: The patient does not smoke or drink.  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  FAMILY HISTORY: Mother deceased at the age of 44 of myocardial infarction. Father deceased at the age of 13 with COPD.  REVIEW OF SYSTEMS: Remarkable for sinus problems, sinus headaches, hypertension, hypercholesterolemia, back pain, and leg pain.  PHYSICAL EXAMINATION:  HEENT: Within normal limits.  NECK: Good range of motion.  CHEST: Clear.  CARDIAC: Regular rate and rhythm.  ABDOMEN: Nontender, no hepatosplenomegaly.  EXTREMITIES: Without clubbing or cyanosis.  Peripheral pulses good.  GU: Examination deferred.  NEUROLOGIC: He is awake and alert and oriented.  Cranial nerves intact.  Motor examination shows 5/5 strength throughout the upper and lower extremities save for dorsiflexion on the right and extensors on  the right.  Sensory deficit at the right L4-5 distribution.  Deep tendon reflexes are 2 at the knees, 2 at the ankles.  Toes downgoing bilaterally.  Straight leg raising positive on the right side, reverse straight leg raising not positive.  LABORATORY DATA: MRI brought with the patient shows a large herniated disk at L4-5 with superior migrated fragment.  CLINICAL IMPRESSION: Right L4-5 radiculopathy secondary to large herniated disk.  PLAN: The plan is for lumbar laminectomy and diskectomy.  The risks and benefits of this approach have been discussed with him and he wishes to proceed. DD:  07/30/00 TD:  07/31/00 Job: [id number]

## 2010-10-06 NOTE — Assessment & Plan Note (Signed)
[name]                            CARDIOLOGY OFFICE NOTE   [name], [name]                     MRN:          [id number]  DATE:07/08/2006                            DOB:          [birth year]    Johnny [name] returns today for followup.  I am seeing him for  hypercholesterolemia and hypertension.   The patient has had intolerances to Lipitor and Caduet.  The Lipitor  caused myalgias.  Caduet has caused left lower extremity, most likely  from the Norvasc component.   We did a treadmill test on the patient the last time he was here.  He  had a hypertensive response but no evidence of ischemia.   He is currently taking Hyzaar 100/12.5.  He seems to have reasonable  blood pressures at home with systolics under 140 and diastolics under  85.   His review of systems is remarkable for less myalgias off Lipitor.  He  has had not had any significant headaches, PND, orthopnea, or chest  pain.   The patient asked me some questions about Actonel.  I told him he really  did not fit the demographic pattern for someone who should have low bone  density.  I encouraged him to possibly have the study repeated.  He is a  very stocky middle-aged male who actually works out with weights and  just does not seem he should have low bone density.   His medications include an aspirin a day and Hyzaar 100/12.5.   On exam, blood pressure today is 125/85.  HEENT is normal.  There are no  carotid bruits, no lymphadenopathy, no thyromegaly.  Lungs are clear.  There is an S1, S2 with normal heart sounds.  Abdomen is benign.  Lower  extremities with intact pulses, no edema.   IMPRESSION:  Hypertension, reasonably controlled on Hyzaar 100/12.5.  The patient has had resolution of his lower extremity edema off the  Norvasc.   We will continue his Hyzaar for the time being.   In regards to his cholesterol, we will start him on Crestor 5 mg a day  and see if his myalgias  return.  If they do not, we can try to escalate  the dose to 10 mg.   He will continue a baby aspirin a day for stroke prophylaxis and  vascular disease.   Once we get his medicines squared away I will probably get him back on a  treadmill to see what his blood pressure response to exercise is.   He will talk to Dr. Lenord Fellers again but it would seem highly unusual for a  patient of this stature to need Actonel for bone density.   I will see him back in 3 months unless he has any problems with his  Crestor.     [name], MD, [facility]  Electronically Signed    PCN/MedQ  DD: 07/08/2006  DT: 07/08/2006  Job #: [id number]

## 2010-10-06 NOTE — Op Note (Signed)
NAME:  ISIAC, BREIGHNER                        ACCOUNT NO.:  [id number]   MEDICAL RECORD NO.:  [id number]                   PATIENT TYPE:  AMB   LOCATION:  DAY                                  FACILITY:  [facility]   PHYSICIAN:  Timothy E. Earlene Plater, M.D.              DATE OF BIRTH:  12-15-1950   DATE OF PROCEDURE:  12/30/2003  DATE OF DISCHARGE:                                 OPERATIVE REPORT   PREOPERATIVE DIAGNOSIS:  Anal condylomata.   POSTOPERATIVE DIAGNOSIS:  Anal condylomata.   PROCEDURE:  CO2 laser destruction of anal condylomata.   SURGEON:  Kendrick Ranch, M.D.   ANESTHESIA:  General.   INDICATIONS FOR PROCEDURE:  Mr. Meulemans is otherwise healthy and has no risk  factors but has developed persistent anal condylomata and after careful  discussion, he wishes to proceed with CO2 laser destruction.  He was  identified and the permit signed.   He was taken to the operating room, placed supine, LMA anesthesia provided.  He was placed in lithotomy.  The perianal area was draped off in a sterile  field.  Careful inspection revealed multiple condylomata about the perianal  skin into the anoderm.  Using magnification and various wattage settings on  the CO2 laser from 10 to 2, each of these condylomata were individually  destroyed.  Careful reappraisal of the area assured that all were treated,  including the intra-anal lesions.  Anoscopy was negative.  The procedure was  complete.  Dibucaine ointment and dry sterile dressing applied.   He will be given written and verbal instructions including Percocet for pain  and followed in the office.                                               Timothy E. Earlene Plater, M.D.    TED/MEDQ  D:  12/30/2003  T:  12/30/2003  Job:  [id number]   cc:   Luanna Cole. Lenord Fellers, M.D.  [address], [place]  [place]  [place] [address]  Fax: [phone]

## 2010-10-06 NOTE — Assessment & Plan Note (Signed)
[facility]                            CARDIOLOGY OFFICE NOTE   THUNDER, BRIDGEWATER                     MRN:          [id number]  DATE:05/10/2006                            DOB:          [birth year]    Mr. Pentecost is a 60 year old patient seen as a new consult.  I actually  saw the patient way back in 2001.  He is a TEFL teacher.  He is  currently at station #[address] off [address]  He has a history of  hyperlipidemia and hypertension.  The patient has been having chest pain  and palpitations.  He had particularly bad episode about two weeks ago.  His chest pain is somewhat atypical.  He tends to get it while lifting  weights.  It seems to be associated with some reflux and GERD.  He takes  some over-the-counter Pepcid but he is not on a regular proton pump  inhibitor.  The patient tends to walk a mile and a half during his shift  and does not get a lot of chest pain.  In regards to his palpitations,  they seem benign.  He does not have any rapid heart beats.  He has never  had syncope.  He does have some mild lower extremity edema on review of  systems.  He was supposed to have a diuretic but he has not been taking  it.  There is a possibility that his Norvasc is contributing to this.   The patient is a nonsmoker, non-diabetic.   I do not have a recent lipid profile on him.   He has not had previous surgeries.   HE DENIES ANY SIGNIFICANT ALLERGIES.   MEDICATIONS:  1. Caduet 40/10.  2. An aspirin a day.  3. He is supposed to be on some diuretic which he has not been taking.   FAMILY HISTORY:  Remarkable for coronary disease in his father.   The patient has 4 children, ranging in age from, I believe, 40 to 57.  This is his second marriage.  He describes normal stress from everyday  life but does not think that he has been having panic attacks.   He does lift weights and walk on a regular basis.  He does not drink  excessively and he  does not smoke.   His last LDL cholesterol, from April 22, 2006, was faxed to [place] during  my dictation.  It was 74.  His LFTs were normal.  His TSH was 0.335  which is mildly suppressed.   PHYSICAL EXAMINATION:  GENERAL:  The patient does not have stigmata of  hyperthyroidism.  HEENT:  Normal.  NECK:  There are no carotid bruits.  There is no JVP elevation.  LUNGS:  Clear.  HEART:  There is an S1 S2 with normal heart sounds.  On the left lateral  decubitus position, there is a faint murmur heard which does not sound  pathologic.  ABDOMEN:  Benign.  There is no hepatosplenomegaly.  EXTREMITIES:  Distal pulses are intact with no edema.  He does talk  about having edema at the end  of the day.   His baseline EKG is normal.   IMPRESSION:  1. Chest pain and palpitations which sound benign.  2. Multiple coronary risk factors.   In regards to his hypertension, he does have complaints of lower  extremity edema.  It may be worthwhile to stop his Norvasc or Caduet and  place him on an ARB or ACE inhibitor.  I will leave this up to Dr.  Lenord Fellers.   Similarly, he complains of some elbow and shoulder pains.  Apparently he  was on a previous Statin which had to be stopped for myalgias.  It may  be worthwhile to stop his Lipitor and see if these pains in his elbows  get worse.  I suspect it has more to do with arthritis at his age with  free weight lifting.  However, my own bias on this patient would be to  have him on Cozaar at 100 mg a day if stopping the Norvasc does not help  his lower extremity edema, this can be switched to Hyzaar a single a  pill.  If his myalgias get better off the Lipitor, he can be switched to  Crestor 5 mg a day.   In regards to his palpitations, we will do a treadmill test on him  today.  I do not think he needs a nuclear study so long as his treadmill  test is normal.  Further recommendations will be based on this.  It was  actually nice to see the patient after  about 6 years.  He is extremely  pleasant and affable fellow.     [name], MD, [facility]  Electronically Signed    PCN/MedQ  DD: 05/10/2006  DT: 05/11/2006  Job #: [id number]   cc:   Luanna Cole. Lenord Fellers, M.D.

## 2010-10-06 NOTE — Assessment & Plan Note (Signed)
[facility]                            CARDIOLOGY OFFICE NOTE   DAKIN, MADANI                     MRN:          [id number]  DATE:09/19/2006                            DOB:          [birth year]    Johnny [name] returns today for followup.  I have been following him for  hypertension, hypercholesterolemia.   ALLERGIES:  The patient has been intolerant to LIPITOR and CADUET in the  past.   He has been doing fairly well on Crestor.   He does not have any significant myalgias or lower extremity edema.   He has been compliant with his meds.  He has been trying to take a low  salt diet.   His activity level is still only moderate.   The patient has Neurontin 300 b.i.d.   REVIEW OF SYSTEMS:  The patient has been having some neck pain.  He  seems to think his wife's Neurontin helps.  There has been no neuropathy  down the arms.  He seems to have had no neck injury.  He has tried  Flexeril in the past, but this has made him too sleepy.   CURRENT MEDICATIONS:  1. An aspirin a day.  2. Crestor 10 a day.  3. Hyzaar 100/12.5.   PHYSICAL EXAMINATION:  VITAL SIGNS:  Blood pressure 125/78 in each arm,  pulse 70 and regular, respirations 12.  The patient's weight is still up  but stable at 240.  GENERAL:  He looks healthy.  HEENT:  Normal.  NECK:  Has no JVP distention.  No thyromegaly.  LUNGS:  He is breathing comfortably with no intercostal retractions.  Auscultation is clear.  HEART:  His heart sounds are normal with an S1, S2.  PMI is normal and  not displaced or enlarged.  There are no carotid bruits.  GI:  Remarkable for no significant masses, no renal bruits.  There is no  hepatosplenomegaly, no hepatojugular reflux.  MUSCULAR:  Normal.  He has no significant weakness.  Distal pulses are  intact with no edema.  SKIN:  Warm and dry.  NEUROLOGICAL:  Nonfocal.   IMPRESSION:  The patient is doing fairly well.  His blood pressure is  well-controlled on Hyzaar.  He had a question regarding ED, and I told  him the Hyzaar should not be causing this.  He is not on any beta  blockers.  He will consult with the urologist if this becomes an issue.  In regards to his cholesterol he needs to have repeat liver and liver  checked on Crestor.  Fortunately he does not seem to be having any edema  or myalgias with it.  So long as his lipid and liver profile are good,  we will keep him on the current dose.   In regards to his neck pain he was given a prescription for Neurontin.  I explained to him it usually takes awhile to work, and  most people  take it three times a day.  However, he seems to have benefit in terms  of ease of night time pain with a single 300 mg dose.  He had been using  his wife's.  I gave him a p.r.n. prescription for 300 b.i.d.   I will see him back in six months unless his LFT's are abnormal.     Theron Arista C. Eden Emms, MD, [facility]  Electronically Signed    PCN/MedQ  DD: 09/19/2006  DT: 09/19/2006  Job #: [id number]

## 2010-10-06 NOTE — Op Note (Signed)
[place]  Patient:    Johnny [name], Johnny [name]                     MRN: [id number] Proc. Date: 07/30/00 Adm. Date:  [id number] Attending:  Emeterio Reeve                           Operative Report  PREOPERATIVE DIAGNOSIS:  Herniated disc L4-5 on the right.  POSTOPERATIVE DIAGNOSIS:  Herniated disc L4-5 on the right.  OPERATION:  L4-5 laminectomy and diskectomy.  SURGEON:  Payton Doughty, M.D.  ASSISTANT:  Mena Goes. Franky Macho, M.D.  ANESTHESIA:  General endotracheal.  PREP:  Sterile Betadine prep and scrubbed with alcohol wipe.  COMPLICATIONS:  None.  INDICATIONS:  This a 60 year old right handed white gentleman with severe right radicular pain secondary to herniated disc at L4-5 and free fragment.  DESCRIPTION OF PROCEDURE:  He taken to the operating room, anesthetized and intubated and placed prone on the operating room table.  Following shave, prep and drape in the usual sterile, the skin was infiltrated with 1% Lidocaine with 1:400,000 epinephrine.  Skin was incised from L3 down to L4.  The lamina isolated initially on x-rays proved to be L3.  Attention was then turned to next inferior lamina which was L4.  The self retaining McCullough retractor was placed.  Hemisemilaminectomy of L4 was carried out to the top of the ligamentum flavum which was removed in a retrograde fashion.  The dura was identified and lateral dissection revealed the lateral aspect of the dural sac and this was retracted medially.  In the anterior epidural space just underneath the posterior longitudinal ligament was a large fragment of disc that was grasped and removed without difficulty.  The annulus of the L4-5 disc was carefully inspected and a defect was not demonstrated.  Therefore the disc space was not explored.  The anterior epidural space was carefully explored and found to be completely devoid of more disc fragments.  The wound was irrigated.  Hemostasis was  assured and the laminectomy defect filled with Depo-Medrol soaked fat.  The fascia was reapproximated with 0 Vicryl in interrupted fashion.  Subcutaneous tissue was reapproximated with 0 Vicryl in interrupted fashion.  Subcuticular tissues were reapproximated with 3-0 Vicryl in interrupted fashion.  The skin was closed with 3-0 Nylon in a running lock fashion.  Betadine Telfa dressing was applied and made occlusive with Op-Site. The patient had returned to the recovery room in good condition. DD:  07/30/00 TD:  07/31/00 Job: [id number]

## 2010-10-06 NOTE — Procedures (Signed)
[facility]                              EXERCISE TREADMILL   EDWARDS, MCKELVIE                     MRN:          [id number]  DATE:05/10/2006                            DOB:          Feb 08, 1951    INDICATION:  Chest pain, palpitations, multiple coronary risk factors.   The patient exercised 12 minutes on a standard Bruce protocol. Exercise  was stopped due to dyspnea and fatigue. Maximum heart rate was 169. Peak  blood pressure was 184/89. Resting EKG was normal with stressors. No  significant arrhythmias and no ischemia.   IMPRESSION:  Negative exercise stress-test to good work load.   Note should be made that the patient had been on Caduet for blood  pressure and hyperlipidemia. He has been having some lower extremity  edema and myalgias. We will stop his Caduet. I think this is  particularly important since he tends towards high blood pressure and  has been taking his Caduet at night due to the Statin drug. Will give  him samples of Cozaar 50 mg a day.  He will follow up with Dr. Lenord Fellers.  As long as his blood pressure is controlled well she can then add a  substitute Statin drug if it is mild and he is improved.   The patient had a good stress-test with low likelihood of coronary  disease.     [name], MD, [facility]  Electronically Signed    PCN/MedQ  DD: 05/10/2006  DT: 05/11/2006  Job #: [id number]

## 2010-12-23 ENCOUNTER — Other Ambulatory Visit: Payer: Self-pay | Admitting: Internal Medicine

## 2011-01-03 ENCOUNTER — Encounter: Payer: Self-pay | Admitting: Internal Medicine

## 2011-01-05 ENCOUNTER — Encounter: Payer: Self-pay | Admitting: Internal Medicine

## 2011-01-05 ENCOUNTER — Ambulatory Visit (INDEPENDENT_AMBULATORY_CARE_PROVIDER_SITE_OTHER): Payer: 59 | Admitting: Internal Medicine

## 2011-01-05 VITALS — BP 110/86 | HR 80 | Temp 99.0°F | Ht 72.0 in | Wt 232.0 lb

## 2011-01-05 DIAGNOSIS — M549 Dorsalgia, unspecified: Secondary | ICD-10-CM

## 2011-01-05 DIAGNOSIS — G8929 Other chronic pain: Secondary | ICD-10-CM

## 2011-01-05 DIAGNOSIS — Z8659 Personal history of other mental and behavioral disorders: Secondary | ICD-10-CM

## 2011-01-07 ENCOUNTER — Encounter: Payer: Self-pay | Admitting: Internal Medicine

## 2011-01-07 NOTE — Progress Notes (Signed)
  Subjective:    Patient ID: Johnny [name], male    DOB: 10-19-1950, 60 y.o.   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 Patient was to come off of Celexa. Has been taking half tablet daily now for several days. His wife has a history of multiple sclerosis and this is somewhat stressful but he says he is dealing with this okay. Has a history of chronic back and neck pain for which he sees Dr. Trey Sailors who prescribes oxycodone/APAP for him. He takes one to 2 a day and sometimes up to 4 a day. I have warned him about taking 4 of these a day which I think is too much.    Review of Systems     Objective:   Physical Exam thought process and judgment are within normal limits.        Assessment & Plan:  History of depression-improved plan explained to patient to taper Celexa by taking half tablet for a total of 2 weeks and then half tablet every other day for 2 weeks before stopping it entirely. Needs to have physical exam in the near future with fasting lab work. Advised patient not to take more than 2 oxycodone/APAP tabs per day

## 2011-01-11 ENCOUNTER — Other Ambulatory Visit: Payer: 59 | Admitting: Internal Medicine

## 2011-01-11 DIAGNOSIS — E785 Hyperlipidemia, unspecified: Secondary | ICD-10-CM

## 2011-01-11 DIAGNOSIS — Z0000 Encounter for general adult medical examination without abnormal findings: Secondary | ICD-10-CM

## 2011-01-11 LAB — PSA: PSA: 1.14 ng/mL (ref <=4.00)

## 2011-01-11 LAB — CBC WITH DIFFERENTIAL/PLATELET
Basophils Absolute: 0 10*3/uL (ref 0.0–0.1)
Basophils Relative: 1 % (ref 0–1)
Eosinophils Absolute: 0.1 10*3/uL (ref 0.0–0.7)
Eosinophils Relative: 2 % (ref 0–5)
HCT: 45.9 % (ref 39.0–52.0)
Hemoglobin: 15.6 g/dL (ref 13.0–17.0)
Lymphocytes Relative: 36 % (ref 12–46)
Lymphs Abs: 3.2 10*3/uL (ref 0.7–4.0)
MCH: 32.6 pg (ref 26.0–34.0)
MCHC: 34 g/dL (ref 30.0–36.0)
MCV: 96 fL (ref 78.0–100.0)
Monocytes Absolute: 0.6 10*3/uL (ref 0.1–1.0)
Monocytes Relative: 7 % (ref 3–12)
Neutro Abs: 4.8 10*3/uL (ref 1.7–7.7)
Neutrophils Relative %: 55 % (ref 43–77)
Platelets: 230 10*3/uL (ref 150–400)
RBC: 4.78 MIL/uL (ref 4.22–5.81)
RDW: 13.1 % (ref 11.5–15.5)
WBC: 8.8 10*3/uL (ref 4.0–10.5)

## 2011-01-11 LAB — COMPREHENSIVE METABOLIC PANEL
Alkaline Phosphatase: 41 U/L (ref 39–117)
BUN: 13 mg/dL (ref 6–23)
Glucose, Bld: 79 mg/dL (ref 70–99)
Sodium: 135 mEq/L (ref 135–145)
Total Bilirubin: 0.8 mg/dL (ref 0.3–1.2)
Total Protein: 7.1 g/dL (ref 6.0–8.3)

## 2011-01-11 LAB — LIPID PANEL
HDL: 42 mg/dL (ref >39)
LDL Cholesterol: 58 mg/dL (ref 0–99)
Triglycerides: 323 mg/dL — ABNORMAL HIGH (ref <150)
VLDL: 65 mg/dL — ABNORMAL HIGH (ref 0–40)

## 2011-01-15 ENCOUNTER — Encounter: Payer: Self-pay | Admitting: Internal Medicine

## 2011-01-18 ENCOUNTER — Ambulatory Visit: Payer: 59 | Admitting: Internal Medicine

## 2011-01-25 ENCOUNTER — Ambulatory Visit: Payer: 59 | Admitting: Internal Medicine

## 2011-01-26 ENCOUNTER — Other Ambulatory Visit: Payer: Self-pay

## 2011-01-26 MED ORDER — FENOFIBRATE 145 MG PO TABS: 145.0000 mg | ORAL_TABLET | Freq: Every day | ORAL | Status: DC

## 2011-02-01 ENCOUNTER — Ambulatory Visit (INDEPENDENT_AMBULATORY_CARE_PROVIDER_SITE_OTHER): Payer: 59 | Admitting: Internal Medicine

## 2011-02-01 ENCOUNTER — Encounter: Payer: Self-pay | Admitting: Internal Medicine

## 2011-02-01 VITALS — BP 110/62 | Temp 97.9°F | Wt 232.0 lb

## 2011-02-01 DIAGNOSIS — F329 Major depressive disorder, single episode, unspecified: Secondary | ICD-10-CM

## 2011-02-01 DIAGNOSIS — F32A Depression, unspecified: Secondary | ICD-10-CM | POA: Insufficient documentation

## 2011-02-01 NOTE — Progress Notes (Signed)
  Subjective:    Patient ID: Johnny [name], male    DOB: 1951-04-28, 60 y.o.   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 History of anxiety depression treated with Celexa 20 mg daily. At last visit, he told me he had tapered himself off of Celexa. Now says he feels like he was much better when he was taking Celexa. He also took one of his sons attention deficit disorder medication and got a lot of chores done around the house and felt like he focused better wants to be treated for attention deficit disorder. Explained to him he has a history of hypertension and I am not comfortable treating him with attention deficit disorder medication 60 y.o. 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 I am happy to restart Celexa 20 mg daily. With regard to hyperlipidemia he is now back on TriCor and we plan to followup in 3-6 months with office visit and lipid panel.    Review of Systems     Objective:   Physical Exam brief psychiatric exam: No suicidal ideations. Some stress dealing with wife who has multiple sclerosis. Has had some issues with his daughter with behavior problems.        Assessment & Plan:  Depression  Hypertension  Hyperlipidemia  Chronic neck and back pain  Osteopenia  Refill Celexa generic 20 mg with when necessary 1 year refill

## 2011-02-26 LAB — I-STAT 8, (EC8 V) (CONVERTED LAB)
BUN: 12
Bicarbonate: 25 — ABNORMAL HIGH
Chloride: 107
Glucose, Bld: 93
HCT: 47
Hemoglobin: 16
Operator id: 234501
Potassium: 4.1
Sodium: 138
TCO2: 26
pCO2, Ven: 41.4 — ABNORMAL LOW
pH, Ven: 7.389 — ABNORMAL HIGH

## 2011-02-26 LAB — LIPID PANEL
HDL: 36 — ABNORMAL LOW
Total CHOL/HDL Ratio: 4.3
Triglycerides: 162 — ABNORMAL HIGH
VLDL: 32

## 2011-02-26 LAB — COMPREHENSIVE METABOLIC PANEL
BUN: 9
Calcium: 9.6
Glucose, Bld: 104 — ABNORMAL HIGH
Total Protein: 6.5

## 2011-02-26 LAB — POCT I-STAT CREATININE: Creatinine, Ser: 1.2

## 2011-02-26 LAB — CBC
HCT: 41.4
Hemoglobin: 13.9
MCHC: 33.7
Platelets: 265
Platelets: 299
RDW: 12.9
RDW: 13.3

## 2011-02-26 LAB — HOMOCYSTEINE: Homocysteine: 12.7

## 2011-02-26 LAB — URINE CULTURE

## 2011-02-26 LAB — SEDIMENTATION RATE: Sed Rate: 3

## 2011-02-26 LAB — URINALYSIS, ROUTINE W REFLEX MICROSCOPIC
Glucose, UA: NEGATIVE
Protein, ur: NEGATIVE
pH: 7.5

## 2011-02-26 LAB — CK TOTAL AND CKMB (NOT AT ARMC)
CK, MB: 1
Relative Index: 0.9
Relative Index: INVALID
Total CK: 57

## 2011-02-26 LAB — HIGH SENSITIVITY CRP: CRP, High Sensitivity: 0.3

## 2011-02-26 LAB — HEMOGLOBIN A1C
Hgb A1c MFr Bld: 5.6
Mean Plasma Glucose: 122

## 2011-02-26 LAB — PROTIME-INR: Prothrombin Time: 12.4

## 2011-03-21 ENCOUNTER — Other Ambulatory Visit: Payer: Self-pay | Admitting: Cardiovascular Disease

## 2011-03-31 ENCOUNTER — Other Ambulatory Visit: Payer: Self-pay | Admitting: Internal Medicine

## 2011-04-02 ENCOUNTER — Other Ambulatory Visit: Payer: Self-pay

## 2011-04-02 MED ORDER — FENOFIBRATE 145 MG PO TABS: 145.0000 mg | ORAL_TABLET | Freq: Every day | ORAL | Status: DC

## 2011-05-27 ENCOUNTER — Other Ambulatory Visit: Payer: Self-pay | Admitting: Cardiovascular Disease

## 2011-05-30 ENCOUNTER — Encounter: Payer: Self-pay | Admitting: Cardiovascular Disease

## 2011-05-30 ENCOUNTER — Ambulatory Visit: Payer: 59 | Admitting: Cardiovascular Disease

## 2011-05-30 ENCOUNTER — Ambulatory Visit (INDEPENDENT_AMBULATORY_CARE_PROVIDER_SITE_OTHER): Payer: 59 | Admitting: Cardiovascular Disease

## 2011-05-30 VITALS — BP 116/60 | HR 74 | Ht 72.0 in | Wt 238.0 lb

## 2011-05-30 DIAGNOSIS — R41840 Attention and concentration deficit: Secondary | ICD-10-CM

## 2011-05-30 DIAGNOSIS — I10 Essential (primary) hypertension: Secondary | ICD-10-CM

## 2011-05-30 DIAGNOSIS — G459 Transient cerebral ischemic attack, unspecified: Secondary | ICD-10-CM

## 2011-05-30 NOTE — Assessment & Plan Note (Signed)
Cholesterol is at goal.  Continue current dose of statin and diet Rx.  No myalgias or side effects.  F/U  LFT's in 6 months. Lab Results  Component Value Date   LDLCALC 58 01/11/2011

## 2011-05-30 NOTE — Assessment & Plan Note (Signed)
nonrecurrent No PFO on TEE  ASA

## 2011-05-30 NOTE — Assessment & Plan Note (Signed)
Told him not to take his families Ritalin.  F/U [facility] to discuss need for Rx or Blase Mess

## 2011-05-30 NOTE — Progress Notes (Signed)
Johnny [name] seen today in followup for hypertension and hypercholesterolemia. Previous question of a TIA. I did a transesophageal echocardiogram on him in March of 2009 there was no PFO. He septally saw Dr. 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 The patient followed up with Dr. Channing Mutters and had his lower back surgery. Seems to have helped him. He has been compliant with his blood pressure and cholesterol pills. He has not had any significant chest pain PND or orthopnea. There is no documented coronary artery disease. His weight has been stable. His primary care physician is Dr. Lenord Fellers. He requested lab work today since he is fasting. We discussed a low carb diet. He seems to think he has ADD. We discussed Ritalin and I advised him to be formerly tested by a psychologist and if he needs meds a nonstimulant alternative would be better.  I congratulated him on his sons graduation from Medical laboratory scientific officer acadamy.  Stopped tricor due to nausea discussed low fat diet  Dont think intermitant gabepentin is a great pain reliever.  Asked him to discuss this with Dr Channing Mutters  ROS: Denies fever, malais, weight loss, blurry vision, decreased visual acuity, cough, sputum, SOB, hemoptysis, pleuritic pain, palpitaitons, heartburn, abdominal pain, melena, lower extremity edema, claudication, or rash.  All other systems reviewed and negative  General: Affect appropriate Healthy:  appears stated age HEENT: normal Neck supple with no adenopathy JVP normal no bruits no thyromegaly Lungs clear with no wheezing and good diaphragmatic motion Heart:  S1/S2 no murmur,rub, gallop or click PMI normal Abdomen: benighn, BS positve, no tenderness, no AAA no bruit.  No HSM or HJR Distal pulses intact with no bruits No edema Neuro non-focal Skin warm and dry No muscular weakness   Current Outpatient Prescriptions  Medication Sig Dispense Refill  . aspirin 325 MG tablet Take 325 mg by mouth daily.        .  citalopram (CELEXA) 20 MG tablet TAKE 1 TABLET BY MOUTH EVERY DAY  30 tablet  0  . CRESTOR 10 MG tablet TAKE 1 TABLET DAILY  90 tablet  0  . gabapentin (NEURONTIN) 600 MG tablet Take 600 mg by mouth as needed.       Marland Kitchen losartan-hydrochlorothiazide (HYZAAR) 100-12.5 MG per tablet TAKE 1 TABLET ONCE A DAY  90 tablet  0  . omeprazole (PRILOSEC) 20 MG capsule Take 20 mg by mouth daily.        Marland Kitchen oxyCODONE-acetaminophen (PERCOCET) 10-325 MG per tablet Take 1 tablet by mouth every 4 (four) hours as needed.        . testosterone (ANDRODERM) 2.5 MG/24HR Place 1 patch onto the skin daily.          Allergies  Hydrocodone  Electrocardiogram:  NSR rate 74  Normal ECG  Assessment and Plan

## 2011-05-30 NOTE — Assessment & Plan Note (Signed)
Well controlled.  Continue current medications and low sodium Dash type diet.    

## 2011-05-30 NOTE — Patient Instructions (Signed)
Your physician wants you to follow-up in: YEAR WITH DR NISHAN  You will receive a reminder letter in the mail two months in advance. If you don't receive a letter, please call our office to schedule the follow-up appointment.  Your physician recommends that you continue on your current medications as directed. Please refer to the Current Medication list given to you today. 

## 2011-07-03 ENCOUNTER — Other Ambulatory Visit: Payer: 59 | Admitting: Internal Medicine

## 2011-07-03 DIAGNOSIS — Z79899 Other long term (current) drug therapy: Secondary | ICD-10-CM

## 2011-07-03 DIAGNOSIS — E785 Hyperlipidemia, unspecified: Secondary | ICD-10-CM

## 2011-07-03 LAB — HEPATIC FUNCTION PANEL
ALT: 25 U/L (ref 0–53)
AST: 25 U/L (ref 0–37)
Albumin: 4.5 g/dL (ref 3.5–5.2)
Alkaline Phosphatase: 46 U/L (ref 39–117)
Total Protein: 7 g/dL (ref 6.0–8.3)

## 2011-07-03 LAB — LIPID PANEL
HDL: 37 mg/dL — ABNORMAL LOW (ref >39)
LDL Cholesterol: 78 mg/dL (ref 0–99)
Triglycerides: 270 mg/dL — ABNORMAL HIGH (ref <150)

## 2011-07-05 ENCOUNTER — Ambulatory Visit: Payer: 59 | Admitting: Internal Medicine

## 2011-07-09 ENCOUNTER — Ambulatory Visit (INDEPENDENT_AMBULATORY_CARE_PROVIDER_SITE_OTHER): Payer: 59 | Admitting: Internal Medicine

## 2011-07-09 ENCOUNTER — Encounter: Payer: Self-pay | Admitting: Internal Medicine

## 2011-07-09 VITALS — BP 126/78 | HR 80 | Temp 97.1°F | Wt 240.0 lb

## 2011-07-09 DIAGNOSIS — E785 Hyperlipidemia, unspecified: Secondary | ICD-10-CM

## 2011-07-09 DIAGNOSIS — I10 Essential (primary) hypertension: Secondary | ICD-10-CM

## 2011-07-09 DIAGNOSIS — J069 Acute upper respiratory infection, unspecified: Secondary | ICD-10-CM

## 2011-07-30 ENCOUNTER — Other Ambulatory Visit: Payer: Self-pay | Admitting: Cardiovascular Disease

## 2011-07-30 MED ORDER — LOSARTAN POTASSIUM-HCTZ 100-12.5 MG PO TABS: 1.0000 | ORAL_TABLET | Freq: Every day | ORAL | Status: DC

## 2011-07-30 MED ORDER — ROSUVASTATIN CALCIUM 10 MG PO TABS: 10.0000 mg | ORAL_TABLET | Freq: Every day | ORAL | Status: DC

## 2011-07-30 NOTE — Telephone Encounter (Signed)
Refill   Verified preferred pharmacy as [facility], patient can be reached at hm# [phone] for any additional questions. [phone]

## 2011-08-18 NOTE — Progress Notes (Signed)
  Subjective:    Patient ID: Johnny [name], male    DOB: 1951-04-18, 61 y.o.   MRN: [id number]  HPI 61 year old white male in today for followup on hypertension, hyperlipidemia, also has acute upper expiratory infection. Is followed by Dr. Eden Emms, cardiologist. Recent fasting lipid panel liver functions drawn. Patient has cough and congestion and maxillary sinus tenderness. Slight sore throat. No fever or shaking chills.    Review of Systems     Objective:   Physical Exam HEENT exam: Slightly injected pharynx. TMs slightly full. Neck supple without significant adenopathy. Chest clear. He sounds nasally congested. Cardiac exam regular rate and rhythm.        Assessment & Plan:  Hypertension  Hyperlipidemia  Acute upper respiratory infection  Plan: Zithromax Z-Pak take 2 tablets by mouth day one followed by 1 by mouth days 2 through 5. Patient is to return in 6 months for physical exam.

## 2011-08-19 ENCOUNTER — Encounter: Payer: Self-pay | Admitting: Internal Medicine

## 2011-11-02 ENCOUNTER — Other Ambulatory Visit: Payer: Self-pay | Admitting: Cardiovascular Disease

## 2011-11-08 ENCOUNTER — Other Ambulatory Visit: Payer: Self-pay

## 2011-11-08 MED ORDER — CITALOPRAM HYDROBROMIDE 20 MG PO TABS: 20.0000 mg | ORAL_TABLET | Freq: Every day | ORAL | Status: DC

## 2011-12-27 ENCOUNTER — Ambulatory Visit (INDEPENDENT_AMBULATORY_CARE_PROVIDER_SITE_OTHER): Payer: 59 | Admitting: Internal Medicine

## 2011-12-27 VITALS — BP 148/88 | HR 80 | Temp 98.2°F | Ht 72.0 in | Wt 244.0 lb

## 2011-12-27 DIAGNOSIS — Z0000 Encounter for general adult medical examination without abnormal findings: Secondary | ICD-10-CM

## 2011-12-27 LAB — POCT URINALYSIS DIPSTICK
Bilirubin, UA: NEGATIVE
Ketones, UA: NEGATIVE
Leukocytes, UA: NEGATIVE
Nitrite, UA: NEGATIVE
Protein, UA: NEGATIVE
pH, UA: 6

## 2011-12-28 ENCOUNTER — Telehealth: Payer: Self-pay | Admitting: Cardiovascular Disease

## 2011-12-28 ENCOUNTER — Ambulatory Visit (INDEPENDENT_AMBULATORY_CARE_PROVIDER_SITE_OTHER): Payer: 59 | Admitting: Internal Medicine

## 2011-12-28 VITALS — BP 152/94

## 2011-12-28 DIAGNOSIS — I10 Essential (primary) hypertension: Secondary | ICD-10-CM

## 2011-12-28 MED ORDER — AMLODIPINE BESYLATE 5 MG PO TABS: 5.0000 mg | ORAL_TABLET | Freq: Every day | ORAL | Status: DC

## 2011-12-28 NOTE — Telephone Encounter (Signed)
Patient states has been having high BP reading for the last 5 to 6 days. BP today 156/90 to 152/102. Patient has been taken Hyzaar 100-12.5 Mg once a daily. Patient thinks  this medication stop working.

## 2011-12-28 NOTE — Telephone Encounter (Signed)
New msg Pt said hyzaar is not working. His BP 156/90 to 152/102 Please call

## 2011-12-28 NOTE — Telephone Encounter (Signed)
Pt was seen by  PCP today and his BP was 140/90 in their office Johnny Newcomer PA recommended for pt to start Norvasc 5 mg one tablet by mouth daily in addition to Hyzaar medication. [facility] , patient aware.

## 2011-12-31 ENCOUNTER — Ambulatory Visit (INDEPENDENT_AMBULATORY_CARE_PROVIDER_SITE_OTHER): Payer: 59 | Admitting: Internal Medicine

## 2011-12-31 ENCOUNTER — Encounter: Payer: Self-pay | Admitting: Internal Medicine

## 2011-12-31 VITALS — BP 130/80

## 2011-12-31 DIAGNOSIS — I10 Essential (primary) hypertension: Secondary | ICD-10-CM

## 2011-12-31 NOTE — Progress Notes (Signed)
  Subjective:    Patient ID: Johnny [name], male    DOB: July 17, 1950, 62 y.o.   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 He  returned on Friday, August 9 and blood pressure still was not well controlled the past DOT exam. So he was placed on amlodipine 5 mg daily.    Review of Systems     Objective:   Physical Exam chest clear to auscultation, cardiac exam regular rate and rhythm, extremities without edema. Blood pressure taken with large cuff in left arm is 130/80 which is acceptable in good enough the past DOT physical exam.        Assessment & Plan:     Hypertension-controlled now after adding  amlodipine 5 mg daily to current regimen  Plan: DOT form signed and card completed for 2 years it did get. He is to return in a couple of weeks for followup and fasting lab studies.

## 2012-01-01 ENCOUNTER — Ambulatory Visit: Payer: 59 | Admitting: Internal Medicine

## 2012-01-01 ENCOUNTER — Encounter: Payer: Self-pay | Admitting: Internal Medicine

## 2012-01-10 ENCOUNTER — Other Ambulatory Visit: Payer: 59 | Admitting: Internal Medicine

## 2012-01-10 DIAGNOSIS — E785 Hyperlipidemia, unspecified: Secondary | ICD-10-CM

## 2012-01-10 DIAGNOSIS — I10 Essential (primary) hypertension: Secondary | ICD-10-CM

## 2012-01-10 DIAGNOSIS — Z125 Encounter for screening for malignant neoplasm of prostate: Secondary | ICD-10-CM

## 2012-01-10 LAB — COMPREHENSIVE METABOLIC PANEL
ALT: 23 U/L (ref 0–53)
AST: 20 U/L (ref 0–37)
Albumin: 4.4 g/dL (ref 3.5–5.2)
Alkaline Phosphatase: 44 U/L (ref 39–117)
Chloride: 101 mEq/L (ref 96–112)
Potassium: 4.3 mEq/L (ref 3.5–5.3)
Sodium: 137 mEq/L (ref 135–145)
Total Protein: 7.2 g/dL (ref 6.0–8.3)

## 2012-01-10 LAB — PSA: PSA: 1.12 ng/mL (ref <=4.00)

## 2012-01-10 LAB — LIPID PANEL
HDL: 37 mg/dL — ABNORMAL LOW (ref >39)
LDL Cholesterol: 80 mg/dL (ref 0–99)

## 2012-01-10 LAB — CBC WITH DIFFERENTIAL/PLATELET
Basophils Absolute: 0 10*3/uL (ref 0.0–0.1)
Basophils Relative: 0 % (ref 0–1)
Lymphocytes Relative: 28 % (ref 12–46)
MCHC: 35 g/dL (ref 30.0–36.0)
Neutro Abs: 5.8 10*3/uL (ref 1.7–7.7)
Neutrophils Relative %: 60 % (ref 43–77)
RDW: 14.1 % (ref 11.5–15.5)
WBC: 9.7 10*3/uL (ref 4.0–10.5)

## 2012-01-11 ENCOUNTER — Ambulatory Visit (INDEPENDENT_AMBULATORY_CARE_PROVIDER_SITE_OTHER): Payer: 59 | Admitting: Internal Medicine

## 2012-01-11 ENCOUNTER — Encounter: Payer: 59 | Admitting: Internal Medicine

## 2012-01-11 ENCOUNTER — Encounter: Payer: Self-pay | Admitting: Internal Medicine

## 2012-01-11 VITALS — BP 130/80 | HR 88 | Temp 98.4°F | Wt 243.0 lb

## 2012-01-11 DIAGNOSIS — E291 Testicular hypofunction: Secondary | ICD-10-CM

## 2012-01-11 DIAGNOSIS — Z860100 Personal history of colon polyps, unspecified: Secondary | ICD-10-CM

## 2012-01-11 DIAGNOSIS — E785 Hyperlipidemia, unspecified: Secondary | ICD-10-CM

## 2012-01-11 DIAGNOSIS — N529 Male erectile dysfunction, unspecified: Secondary | ICD-10-CM

## 2012-01-11 DIAGNOSIS — M8580 Other specified disorders of bone density and structure, unspecified site: Secondary | ICD-10-CM

## 2012-01-11 DIAGNOSIS — K219 Gastro-esophageal reflux disease without esophagitis: Secondary | ICD-10-CM

## 2012-01-11 DIAGNOSIS — R7309 Other abnormal glucose: Secondary | ICD-10-CM

## 2012-01-11 DIAGNOSIS — R7302 Impaired glucose tolerance (oral): Secondary | ICD-10-CM

## 2012-01-11 DIAGNOSIS — E559 Vitamin D deficiency, unspecified: Secondary | ICD-10-CM

## 2012-01-11 DIAGNOSIS — R7989 Other specified abnormal findings of blood chemistry: Secondary | ICD-10-CM

## 2012-01-11 DIAGNOSIS — M899 Disorder of bone, unspecified: Secondary | ICD-10-CM

## 2012-01-11 DIAGNOSIS — G8929 Other chronic pain: Secondary | ICD-10-CM

## 2012-01-11 DIAGNOSIS — I10 Essential (primary) hypertension: Secondary | ICD-10-CM

## 2012-01-11 DIAGNOSIS — M549 Dorsalgia, unspecified: Secondary | ICD-10-CM

## 2012-01-19 ENCOUNTER — Encounter: Payer: Self-pay | Admitting: Internal Medicine

## 2012-01-19 NOTE — Progress Notes (Signed)
  Subjective:    Patient ID: Johnny [name], male    DOB: Jan 15, 1951, 61 y.o.   MRN: [id number]  HPI Yesterday patient was here for DOT physical exam and form completion. His blood pressure was elevated. He could not remember if he had taken Hyzaar 100/12.5 prior to coming to the office. We had him return today and would not sign the DOT form until he had blood pressure recheck. Blood pressure is still elevated today he did take Hyzaar this morning before coming to the office.    Review of Systems     Objective:   Physical Exam chest clear to auscultation, cardiac exam regular rate and rhythm, extremities without edema         Assessment & Plan:  Hypertension-currently not well controlled  Plan: Start amlodipine 5 mg daily in addition to Hyzaar 100/12.5 mg. Return Monday, August 12 for blood pressure recheck.

## 2012-01-19 NOTE — Patient Instructions (Addendum)
Start amlodipine 5 mg daily in addition to Hyzaar 100/12.5 mg daily return on August 12

## 2012-01-20 DIAGNOSIS — G8929 Other chronic pain: Secondary | ICD-10-CM | POA: Insufficient documentation

## 2012-01-20 DIAGNOSIS — N529 Male erectile dysfunction, unspecified: Secondary | ICD-10-CM | POA: Insufficient documentation

## 2012-01-20 DIAGNOSIS — M8580 Other specified disorders of bone density and structure, unspecified site: Secondary | ICD-10-CM | POA: Insufficient documentation

## 2012-01-20 DIAGNOSIS — R7989 Other specified abnormal findings of blood chemistry: Secondary | ICD-10-CM | POA: Insufficient documentation

## 2012-01-20 DIAGNOSIS — E559 Vitamin D deficiency, unspecified: Secondary | ICD-10-CM | POA: Insufficient documentation

## 2012-01-20 DIAGNOSIS — K219 Gastro-esophageal reflux disease without esophagitis: Secondary | ICD-10-CM | POA: Insufficient documentation

## 2012-01-20 DIAGNOSIS — R7302 Impaired glucose tolerance (oral): Secondary | ICD-10-CM | POA: Insufficient documentation

## 2012-01-20 DIAGNOSIS — Z86010 Personal history of colonic polyps: Secondary | ICD-10-CM | POA: Insufficient documentation

## 2012-01-20 NOTE — Patient Instructions (Addendum)
Try the diet exercise and lose weight. Your triglycerides remain markedly elevated. Make sure you're taking Crestor 20 mg daily. Consider adding TriCor to Crestor. Return in 6 months for fasting lipid panel liver functions hemoglobin A1c with office visit blood pressure check.

## 2012-01-20 NOTE — Progress Notes (Signed)
  Subjective:    Patient ID: Johnny [name], male    DOB: 08/30/1950, 61 y.o.   MRN: [id number]  HPI  61 year old white male had DOT physical earlier in the month of blood pressure was not well-controlled. He was started on amlodipine in addition to his other antihypertensive medication and blood pressure improved within a few days to allow him to qualify for DOT certificate. He is here today for recheck on blood pressure. No side effects with amlodipine. Has had recent lab work and has persistently elevated triglycerides of 276 and were 276 months ago. Current dose of Crestor is listed as 10 mg daily. Would benefit from increasing Crestor to 20 mg daily. Total cholesterol and LDL cholesterol are normal. PSA is normal. C. met is normal. CBC is normal. Fasting creatinine is 1.19. Fasting glucose is normal. History of impaired glucose tolerance.    Review of Systems     Objective:   Physical Exam neck is supple without JVD thyromegaly or carotid bruits. Chest clear to auscultation. Cardiac exam regular rate and rhythm normal S1 and S2. Extremities without edema.        Assessment & Plan:  Hypertension  History of hyperlipidemia-recent lab work reviewed. Suggest increasing Crestor to 20 mg daily  History of impaired glucose tolerance-at next visit need to check hemoglobin A1c. Fasting glucose is normal.  Obesity-needs to diet, exercise and lose weight  History of vitamin D deficiency-to be taking vitamin D 2000 units daily  History of osteopenia  History of adenomatous colon polyps status post colonoscopy 2010

## 2012-02-04 ENCOUNTER — Other Ambulatory Visit: Payer: Self-pay | Admitting: Cardiovascular Disease

## 2012-02-04 MED ORDER — ROSUVASTATIN CALCIUM 10 MG PO TABS: ORAL_TABLET | ORAL | Status: DC

## 2012-02-04 NOTE — Telephone Encounter (Signed)
Pt needs 90 day supply to [facility]

## 2012-03-07 ENCOUNTER — Encounter: Payer: Self-pay | Admitting: Internal Medicine

## 2012-03-07 ENCOUNTER — Ambulatory Visit (INDEPENDENT_AMBULATORY_CARE_PROVIDER_SITE_OTHER): Payer: 59 | Admitting: Internal Medicine

## 2012-03-07 VITALS — BP 110/74 | HR 80 | Temp 98.1°F | Ht 72.0 in | Wt 241.0 lb

## 2012-03-07 DIAGNOSIS — E781 Pure hyperglyceridemia: Secondary | ICD-10-CM

## 2012-03-07 DIAGNOSIS — Z23 Encounter for immunization: Secondary | ICD-10-CM

## 2012-03-07 DIAGNOSIS — I10 Essential (primary) hypertension: Secondary | ICD-10-CM

## 2012-03-07 DIAGNOSIS — Z8739 Personal history of other diseases of the musculoskeletal system and connective tissue: Secondary | ICD-10-CM

## 2012-03-07 MED ORDER — TETANUS-DIPHTH-ACELL PERTUSSIS 5-2.5-18.5 LF-MCG/0.5 IM SUSP: 0.5000 mL | Freq: Once | INTRAMUSCULAR | Status: DC

## 2012-03-07 NOTE — Progress Notes (Signed)
  Subjective:    Patient ID: Johnny [name], male    DOB: [birth year], 61 y.o.   MRN: [id number]  HPI 61 year old White male with hypertension, osteopenia, hyperlipidemia. Remains on Crestor 10 mg daily. Unable to tolerate Lipitor because of myalgias. In today for followup of hypertension. Elevated blood pressure noted on DOT exam which improved after starting amlodipine. Feels well on current regimen. However fasting lipid panel at last visit showed significantly elevated triglycerides on Crestor 10 mg daily. Has not been taking vitamin D supplement as prescribed. Reminded to do so.  History of low testosterone level, erectile dysfunction, GE reflux, depression, attention deficit disorder.    Review of Systems     Objective:   Physical Exam Neck supple no thyromegaly or carotid bruits. Chest: clear to auscultation; Cardiac exam: regular rate and rhythm normal S1 and S2; Extremities without edema        Assessment & Plan:  Hypertension  Hypertriglyceridemia  History of osteopenia  Plan: Increase Crestor 20 mg daily and recheck in 3 months with fasting lipid panel, liver functions, and office visit.

## 2012-03-08 MED ORDER — ROSUVASTATIN CALCIUM 10 MG PO TABS: 20.0000 mg | ORAL_TABLET | Freq: Every day | ORAL | Status: DC

## 2012-03-08 MED ORDER — ROSUVASTATIN CALCIUM 20 MG PO TABS: 20.0000 mg | ORAL_TABLET | Freq: Every day | ORAL | Status: DC

## 2012-03-08 NOTE — Patient Instructions (Addendum)
Increase Crestor to 20 mg daily and return in 3 months

## 2012-03-11 ENCOUNTER — Other Ambulatory Visit: Payer: 59 | Admitting: Internal Medicine

## 2012-03-13 ENCOUNTER — Other Ambulatory Visit: Payer: 59 | Admitting: Internal Medicine

## 2012-03-13 DIAGNOSIS — E785 Hyperlipidemia, unspecified: Secondary | ICD-10-CM

## 2012-03-13 DIAGNOSIS — R7301 Impaired fasting glucose: Secondary | ICD-10-CM

## 2012-03-13 LAB — LIPID PANEL
HDL: 38 mg/dL — ABNORMAL LOW (ref >39)
LDL Cholesterol: 107 mg/dL — ABNORMAL HIGH (ref 0–99)
Triglycerides: 204 mg/dL — ABNORMAL HIGH (ref <150)
VLDL: 41 mg/dL — ABNORMAL HIGH (ref 0–40)

## 2012-05-06 ENCOUNTER — Other Ambulatory Visit: Payer: Self-pay | Admitting: Cardiovascular Disease

## 2012-05-06 NOTE — Telephone Encounter (Signed)
Patient called and requested refilled a script for Hyzaar 100-12.5, [facility]

## 2012-05-08 MED ORDER — LOSARTAN POTASSIUM-HCTZ 100-12.5 MG PO TABS: ORAL_TABLET | ORAL | Status: DC

## 2012-05-08 NOTE — Telephone Encounter (Signed)
Follow-up:    Patient's wife called in needing a refill of his losartan-hydrochlorothiazide (HYZAAR) 100-12.5 MG per tablet [facility] [place] [facility] profile.

## 2012-07-13 NOTE — Patient Instructions (Addendum)
Take Hyzaar in the morning and return for blood pressure check tomorrow

## 2012-07-13 NOTE — Progress Notes (Signed)
Subjective:    Patient ID: Johnny [name], male    DOB: 1951-03-01, 62 y.o.   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 Also needs DOT exam. He has a history of osteopenia, hypertension, hyperlipidemia, internal hemorrhoids, adenomatous colon polyps, low testosterone, GE reflux, vitamin D deficiency and prediabetes. Had anal warts removed in June 2005. Had back surgery by Dr. Channing Mutters February 2010. 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  He also had upper endoscopy done by Dr. school her in June 2010 showing suspected aspirin-induced abnormality in the duodenum, hiatal hernia, and reflux esophagitis. History of adenomatous colon polyps on colonoscopy in July 2005. Repeat colonoscopy June 2010 was normal.  He has seen Dr. Winn Jock in the past for cardiology evaluation. He had a treadmill test in 2007 which was normal.  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 Wife has multiple sclerosis.  He does not smoke. Very occasional alcohol consumption. 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  Family history: Father died at 33 due to complications of COPD. Mother died at age 10 with an MI with history of CVA. Maternal aunt with hypertension. One brother and one sister in good health. 3 children.    Review of Systems  Constitutional: Positive for fatigue.  Eyes: Negative.   Respiratory: Negative.   Cardiovascular:       Hypertension and hyperlipidemia  Gastrointestinal:       History of GE reflux  Endocrine: Negative.   Genitourinary:       History of low testosterone level  Musculoskeletal: Positive for back pain.  Neurological: Negative.   Hematological: Negative.   Psychiatric/Behavioral: Negative.        Objective:   Physical Exam  Vitals reviewed. Constitutional: He is oriented to person, place, and time. He  appears well-developed and well-nourished. No distress.  HENT:  Head: Normocephalic and atraumatic.  Right Ear: External ear normal.  Left Ear: External ear normal.  Mouth/Throat: Oropharynx is clear and moist.  Eyes: Conjunctivae and EOM are normal. Pupils are equal, round, and reactive to light.  Neck: Neck supple. No JVD present. No thyromegaly present.  Cardiovascular: Normal rate, regular rhythm, normal heart sounds and intact distal pulses.   No murmur heard. Pulmonary/Chest: Effort normal and breath sounds normal. No respiratory distress. He has no wheezes. He has no rales. He exhibits no tenderness.  Abdominal: Soft. Bowel sounds are normal. He exhibits no distension and no mass. There is no tenderness. There is no rebound and no guarding.  Genitourinary: Prostate normal.  Musculoskeletal: Normal range of motion. He exhibits no edema.  Lymphadenopathy:    He has no cervical adenopathy.  Neurological: He is alert and oriented to person, place, and time. He has normal reflexes. No cranial nerve deficit. Coordination normal.  Skin: Skin is warm and dry. He is not diaphoretic.  Psychiatric: He has a normal mood and affect. His behavior is normal. Judgment and thought content normal.          Assessment & Plan:  Hypertension-cannot remember if he took Hyzaar this morning. Blood pressure is not acceptable to pass DAT exam  Hyperlipidemia  History of back pain status post lumbar back surgery 2010  Osteopenia  History of adenomatous colon polyps status post colonoscopy 2010  History of low serum testosterone  GE reflux and esophagitis status post endoscopy  2010  History of prediabetes  History of vitamin D deficiency  History of condyloma anal area 2005-removed  Plan: Take Hyzaar in the morning and return for blood pressure check. We may need to adjust blood pressure medication regimen

## 2012-08-22 ENCOUNTER — Encounter (INDEPENDENT_AMBULATORY_CARE_PROVIDER_SITE_OTHER): Payer: Self-pay | Admitting: General Surgery

## 2012-08-22 ENCOUNTER — Ambulatory Visit (INDEPENDENT_AMBULATORY_CARE_PROVIDER_SITE_OTHER): Payer: Commercial Managed Care - PPO | Admitting: General Surgery

## 2012-08-22 VITALS — BP 136/84 | HR 72 | Temp 97.6°F | Resp 16 | Ht 72.0 in | Wt 242.0 lb

## 2012-08-22 DIAGNOSIS — A630 Anogenital (venereal) warts: Secondary | ICD-10-CM

## 2012-08-22 NOTE — Patient Instructions (Signed)
Follow up with Dr. Thomas

## 2012-08-25 ENCOUNTER — Encounter (INDEPENDENT_AMBULATORY_CARE_PROVIDER_SITE_OTHER): Payer: Self-pay | Admitting: General Surgery

## 2012-08-25 NOTE — Progress Notes (Signed)
Subjective:     Patient ID: Johnny [name], male   DOB: [birth year], 62 y.o.   MRN: [id number]  HPI We are asked to see the patient in consultation by Dr. Lenord Fellers to evaluate him for anal condyloma. The patient is a 62 year old white male who we saw several years ago with an area of anal condyloma. Recently he has developed some itching and felt to small bowel near his rectum. He denies any bleeding. He denies any problems with constipation or diarrhea.  Review of Systems  Constitutional: Negative.   HENT: Negative.   Eyes: Negative.   Respiratory: Negative.   Cardiovascular: Negative.   Gastrointestinal: Negative.   Endocrine: Negative.   Genitourinary: Negative.   Musculoskeletal: Negative.   Skin: Negative.   Allergic/Immunologic: Negative.   Neurological: Negative.   Hematological: Negative.   Psychiatric/Behavioral: Negative.        Objective:   Physical Exam  Constitutional: He is oriented to person, place, and time. He appears well-developed and well-nourished.  HENT:  Head: Normocephalic and atraumatic.  Eyes: Conjunctivae and EOM are normal. Pupils are equal, round, and reactive to light.  Neck: Normal range of motion. Neck supple.  Cardiovascular: Normal rate, regular rhythm and normal heart sounds.   Pulmonary/Chest: Effort normal and breath sounds normal.  Abdominal: Soft. Bowel sounds are normal.  Genitourinary:  His perirectal skin overall looks normal. Posteriorly he has one small area of what appears to be condyloma about a centimeter or 2 from the anal verge. This was treated with podophylin and covered with a cotton ball  Musculoskeletal: Normal range of motion.  Neurological: He is alert and oriented to person, place, and time.  Skin: Skin is warm and dry.  Psychiatric: He has a normal mood and affect. His behavior is normal.       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 This was treated  with podophylin.     Plan:     At this point I recommended that he followup with our colorectal specialist Dr. Maisie Fus for possible high-resolution anoscopy. I will plan to see him back on a when necessary basis

## 2012-10-01 ENCOUNTER — Ambulatory Visit (INDEPENDENT_AMBULATORY_CARE_PROVIDER_SITE_OTHER): Payer: 59 | Admitting: General Surgery

## 2012-10-01 ENCOUNTER — Encounter (INDEPENDENT_AMBULATORY_CARE_PROVIDER_SITE_OTHER): Payer: Self-pay | Admitting: General Surgery

## 2012-10-01 VITALS — BP 142/78 | HR 94 | Resp 14 | Ht 72.0 in | Wt 238.4 lb

## 2012-10-01 DIAGNOSIS — A630 Anogenital (venereal) warts: Secondary | ICD-10-CM

## 2012-10-01 NOTE — Progress Notes (Signed)
No chief complaint on file.   HISTORY: Johnny [name] is a 62 y.o. male who presents to the office with anal itching.  Other symptoms include bleeding.  This had been occurring for several months.  He has tried ablation and podophyllin in the past with some success.   Past Medical History  Diagnosis Date  . Hyperlipidemia   . Hypertension   . GERD (gastroesophageal reflux disease)   . Osteopenia   . Internal hemorrhoids   . Adenomatous polyps   . Low testosterone   . Vitamin D deficiency       No past surgical history on file.     S/p condyloma ablation by Dr Zachery Dakins  Current Outpatient Prescriptions  Medication Sig Dispense Refill  . aspirin 325 MG tablet Take 325 mg by mouth daily.        Marland Kitchen CIALIS 5 MG tablet       . citalopram (CELEXA) 20 MG tablet Take 1 tablet (20 mg total) by mouth daily.  90 tablet  3  . cyclobenzaprine (FLEXERIL) 10 MG tablet       . gabapentin (NEURONTIN) 600 MG tablet Take 600 mg by mouth as needed.       Marland Kitchen losartan-hydrochlorothiazide (HYZAAR) 100-12.5 MG per tablet TAKE 1 TABLET DAILY (NEED APPOINTMENT FOR FURTHER REFILLS)  90 tablet  3  . omeprazole (PRILOSEC) 20 MG capsule Take 20 mg by mouth daily.        Marland Kitchen oxyCODONE-acetaminophen (PERCOCET) 10-325 MG per tablet Take 1 tablet by mouth every 4 (four) hours as needed.        . rosuvastatin (CRESTOR) 20 MG tablet Take 1 tablet (20 mg total) by mouth daily.  90 tablet  3  . testosterone (ANDRODERM) 2.5 MG/24HR Place 1 patch onto the skin daily.         No current facility-administered medications for this visit.      Allergies  Allergen Reactions  . Hydrocodone Nausea Only      Family History  Problem Relation Age of Onset  . Heart disease Mother   . Stroke Mother     History   Social History  . Marital Status: Married    Spouse Name: N/A    Number of Children: N/A  . Years of Education: N/A   Social History Main Topics  . Smoking status: Former Smoker -- 10 years    Types:  Cigarettes    Quit date: 01/04/1977  . Smokeless tobacco: Never Used  . Alcohol Use: Yes     Comment: rarely  . Drug Use: No  . Sexually Active: None   Other Topics Concern  . None   Social History Narrative  . None      REVIEW OF SYSTEMS - PERTINENT POSITIVES ONLY: Review of Systems - General ROS: negative for - chills, fever or weight loss Hematological and Lymphatic ROS: negative for - bleeding problems, blood clots or bruising Respiratory ROS: no cough, shortness of breath, or wheezing Cardiovascular ROS: no chest pain or dyspnea on exertion Gastrointestinal ROS: no abdominal pain, change in bowel habits, or black or bloody stools Genito-Urinary ROS: no dysuria, trouble voiding, or hematuria  EXAM: Filed Vitals:   10/01/12 1505  BP: 142/78  Pulse: 94  Resp: 14    General appearance: alert and cooperative Resp: clear to auscultation bilaterally Cardio: regular rate and rhythm GI: soft, non-tender; bowel sounds normal; no masses,  no organomegaly   Procedure: Anoscopy Surgeon: Maisie Fus Diagnosis: anal itching  Assistant:  Morris After the risks and benefits were explained, verbal consent was obtained for above procedure  Anesthesia: none Findings: anal condyloma, post perianal, L lateral mass palpated    ASSESSMENT AND PLAN: Johnny [name] is a 62 y.o. M with anal condyloma.  I think he would benefit from a HRA and laser ablation.  He is agreeable to this.  We discussed risks such as pain, bleeding and recurrence.      Vanita Panda, MD Colon and Rectal Surgery / General Surgery [facility]      Visit Diagnoses: 1. Anal condyloma     Primary Care Physician: Margaree Mackintosh, MD

## 2012-10-01 NOTE — Patient Instructions (Signed)
Anal Warts  What are anal warts? Anal warts (also called "condyloma acuminata") are a condition that affects the area around and inside the anus. They may also affect the skin of the genital area. They first appear as tiny spots or growths, perhaps as small as the head of a pin, and may grow quite large and cover the entire anal area. Usually, they do not cause pain or discomfort to afflicted individuals and patients may be unaware that the warts are present. Some patients will experience symptoms, such as itching, bleeding, mucus discharge and/or a feeling of a lump or mass in the anal area.  What causes anal warts? They are caused by the human papilloma virus (HPV), which is transmitted from person to person by direct contact. HPV is considered a sexually transmitted disease (STD). You do not have to have anal intercourse to develop anal warts. Do anal warts always need to be removed? Yes. If they are not removed, the warts usually grow larger and multiply. Left untreated, the warts may lead to an increased risk of cancer in the affected area. What treatments are available? If warts are very small and are located only on the skin around the anus, they may be treated with a topical medication. They may also be treated by freezing the warts with liquid nitrogen or removed surgically. Surgery typically involves cutting or burning the warts off. While this provides immediate results, it must be performed using either a local anesthetic - such as novocaine - or a general or spinal anesthetic, depending on the number and exact location of warts being treated. It is important that an internal anal examination with an instrument called an anoscope be done by your treating physician to ensure you do not have any inside the anal canal (internal anal warts). Internal anal warts may not be as suitable for treatment by topical medications, and may need to be treated surgically. Additionally, your physician may wish to  examine the entire pelvic region to include the vaginal or penile area to look for other warts that may require treatment. Must I be hospitalized for surgical treatment? Surgical treatment of anal warts is usually performed as outpatient surgery. How much time will I lose from work after surgical treatment? Most people are moderately uncomfortable for a few days after treatment and pain medication may be prescribed. Depending on the extent of the disease, some people return to work the next day, while others may remain out of work for several days to weeks. Will a single treatment cure the problem? When warts are extensive, your surgeon may wish to perform the surgery in stages. Additionally, recurrent warts are common. The virus that causes the warts can live concealed in tissues that appear normal for several months before another wart develops. As new warts develop, they usually can be treated in the physician's office. Sometimes new warts develop so rapidly that office treatment would be quite uncomfortable. In these situations, a second and, occasionally, third outpatient surgical visit may be recommended. How long is treatment usually continued? Follow-up visits are necessary at frequent intervals for several months after all warts appear to be gone, to be certain that no new warts occur. What can be done to avoid getting these warts again? In some cases, warts may recur repeatedly after successful removal, since the virus that causes the warts often persists in a dormant state in body tissues. Discuss with your physician how often you should be evaluated for recurrent warts. Abstain from sexual   contact with individuals who have anal (or genital) warts. Since many individuals may be unaware that they suffer from this condition, sexual abstinence, condom protection or limiting sexual contact to single partner will reduce your potential exposure to the contagious virus that causes these warts. As a  precaution, sexual partners ought to be checked for warts and other sexual transmitted diseases, even if they have no symptoms. What is a colon and rectal surgeon? Colon and rectal surgeons are experts in the surgical and non-surgical treatment of diseases of the colon, rectum and anus. They have completed advanced surgical training in the treatment of these diseases as well as full general surgical training. Board-certified colon and rectal surgeons complete residencies in general surgery and colon and rectal surgery, and pass intensive examinations conducted by the American Board of Surgery and the American Board of Colon and Rectal Surgery. They are well-versed in the treatment of both benign and malignant diseases of the colon, rectum and anus and are able to perform routine screening examinations and surgically treat conditions if indicated to do so. author: Jennifer Lowney, MD, FASCRS, on behalf of the ASCRS Public Relations Committee  2012 American Society of Colon & Rectal Surgeons   

## 2012-11-26 ENCOUNTER — Other Ambulatory Visit: Payer: Self-pay | Admitting: Internal Medicine

## 2013-01-14 ENCOUNTER — Other Ambulatory Visit: Payer: Self-pay | Admitting: Internal Medicine

## 2013-04-19 ENCOUNTER — Other Ambulatory Visit: Payer: Self-pay | Admitting: Cardiovascular Disease

## 2013-07-01 ENCOUNTER — Other Ambulatory Visit: Payer: Self-pay | Admitting: Internal Medicine

## 2013-07-02 NOTE — Telephone Encounter (Signed)
Please check last visit before refilling.

## 2013-07-27 ENCOUNTER — Other Ambulatory Visit: Payer: Self-pay | Admitting: Cardiovascular Disease

## 2013-07-28 ENCOUNTER — Other Ambulatory Visit: Payer: Self-pay

## 2013-07-28 MED ORDER — LOSARTAN POTASSIUM-HCTZ 100-12.5 MG PO TABS: ORAL_TABLET | ORAL | Status: DC

## 2013-08-13 ENCOUNTER — Other Ambulatory Visit: Payer: 59 | Admitting: Internal Medicine

## 2013-08-13 DIAGNOSIS — R7301 Impaired fasting glucose: Secondary | ICD-10-CM

## 2013-08-13 DIAGNOSIS — E785 Hyperlipidemia, unspecified: Secondary | ICD-10-CM

## 2013-08-13 LAB — LIPID PANEL
CHOLESTEROL: 161 mg/dL (ref 0–200)
HDL: 35 mg/dL — ABNORMAL LOW (ref >39)
LDL CALC: 82 mg/dL (ref 0–99)
TRIGLYCERIDES: 220 mg/dL — AB (ref <150)
Total CHOL/HDL Ratio: 4.6 Ratio
VLDL: 44 mg/dL — ABNORMAL HIGH (ref 0–40)

## 2013-08-13 LAB — HEMOGLOBIN A1C
Hgb A1c MFr Bld: 5.6 % (ref <5.7)
MEAN PLASMA GLUCOSE: 114 mg/dL (ref <117)

## 2013-08-18 ENCOUNTER — Ambulatory Visit: Payer: 59 | Admitting: Internal Medicine

## 2013-08-19 ENCOUNTER — Ambulatory Visit: Payer: 59 | Admitting: Cardiovascular Disease

## 2013-08-25 ENCOUNTER — Ambulatory Visit (INDEPENDENT_AMBULATORY_CARE_PROVIDER_SITE_OTHER): Payer: 59 | Admitting: Internal Medicine

## 2013-08-25 ENCOUNTER — Encounter: Payer: Self-pay | Admitting: Internal Medicine

## 2013-08-25 VITALS — BP 136/100 | HR 80 | Ht 72.0 in | Wt 243.0 lb

## 2013-08-25 DIAGNOSIS — M949 Disorder of cartilage, unspecified: Secondary | ICD-10-CM

## 2013-08-25 DIAGNOSIS — Z8639 Personal history of other endocrine, nutritional and metabolic disease: Secondary | ICD-10-CM

## 2013-08-25 DIAGNOSIS — E785 Hyperlipidemia, unspecified: Secondary | ICD-10-CM

## 2013-08-25 DIAGNOSIS — A630 Anogenital (venereal) warts: Secondary | ICD-10-CM

## 2013-08-25 DIAGNOSIS — M8580 Other specified disorders of bone density and structure, unspecified site: Secondary | ICD-10-CM

## 2013-08-25 DIAGNOSIS — Z862 Personal history of diseases of the blood and blood-forming organs and certain disorders involving the immune mechanism: Secondary | ICD-10-CM

## 2013-08-25 DIAGNOSIS — I10 Essential (primary) hypertension: Secondary | ICD-10-CM

## 2013-08-25 DIAGNOSIS — E669 Obesity, unspecified: Secondary | ICD-10-CM

## 2013-08-25 DIAGNOSIS — Z8739 Personal history of other diseases of the musculoskeletal system and connective tissue: Secondary | ICD-10-CM

## 2013-08-25 DIAGNOSIS — Z87898 Personal history of other specified conditions: Secondary | ICD-10-CM

## 2013-08-25 DIAGNOSIS — K219 Gastro-esophageal reflux disease without esophagitis: Secondary | ICD-10-CM

## 2013-08-25 DIAGNOSIS — M899 Disorder of bone, unspecified: Secondary | ICD-10-CM

## 2013-08-26 MED ORDER — ROSUVASTATIN CALCIUM 20 MG PO TABS: 20.0000 mg | ORAL_TABLET | Freq: Every day | ORAL | Status: DC

## 2013-08-27 ENCOUNTER — Other Ambulatory Visit: Payer: Self-pay

## 2013-08-27 MED ORDER — LOSARTAN POTASSIUM-HCTZ 100-12.5 MG PO TABS: ORAL_TABLET | ORAL | Status: DC

## 2013-09-10 ENCOUNTER — Ambulatory Visit (INDEPENDENT_AMBULATORY_CARE_PROVIDER_SITE_OTHER): Payer: 59 | Admitting: Cardiovascular Disease

## 2013-09-10 ENCOUNTER — Encounter: Payer: Self-pay | Admitting: Cardiovascular Disease

## 2013-09-10 VITALS — BP 120/90 | HR 90 | Ht 72.0 in | Wt 242.0 lb

## 2013-09-10 DIAGNOSIS — I10 Essential (primary) hypertension: Secondary | ICD-10-CM

## 2013-09-10 DIAGNOSIS — K219 Gastro-esophageal reflux disease without esophagitis: Secondary | ICD-10-CM

## 2013-09-10 DIAGNOSIS — E7800 Pure hypercholesterolemia, unspecified: Secondary | ICD-10-CM

## 2013-09-10 MED ORDER — LOSARTAN POTASSIUM-HCTZ 100-12.5 MG PO TABS: 1.0000 | ORAL_TABLET | Freq: Every day | ORAL | Status: DC

## 2013-09-10 MED ORDER — ROSUVASTATIN CALCIUM 10 MG PO TABS: 10.0000 mg | ORAL_TABLET | Freq: Every day | ORAL | Status: DC

## 2013-09-10 NOTE — Addendum Note (Signed)
Addended by: Joelyn Oms F on: 09/10/2013 04:17 PM   Modules accepted: Orders

## 2013-09-10 NOTE — Assessment & Plan Note (Signed)
Well controlled.  Continue current medications and low sodium Dash type diet.    

## 2013-09-10 NOTE — Assessment & Plan Note (Signed)
Cholesterol is at goal.  Continue current dose of statin and diet Rx.  No myalgias or side effects.  F/U  LFT's in 6 months. Lab Results  Component Value Date   LDLCALC 82 08/13/2013

## 2013-09-10 NOTE — Patient Instructions (Signed)
Your physician recommends that you continue on your current medications as directed. Please refer to the Current Medication list given to you today.  Your physician wants you to follow-up in: 1 year with Dr. Nishan. You will receive a reminder letter in the mail two months in advance. If you don't receive a letter, please call our office to schedule the follow-up appointment.  

## 2013-09-10 NOTE — Assessment & Plan Note (Signed)
Related to weight and diet  PRN OTC antacid

## 2013-09-10 NOTE — Progress Notes (Signed)
Patient ID: Johnny [name], male   DOB: 1950/11/28, 63 y.o.   MRN: [id number] Corey seen today in followup for hypertension and hypercholesterolemia. Previous question of a TIA. I did a transesophageal echocardiogram on him in March of 2009 there was no PFO. He septally saw Dr. 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 The patient followed up with Dr. Carloyn Manner and had his lower back surgery. Seems to have helped him. He has been compliant with his blood pressure and cholesterol pills. He has not had any significant chest pain PND or orthopnea. There is no documented coronary artery disease. His weight has been stable. His primary care physician is Dr. Renold Genta. She increased his crestor and indicates that cholesterol is good   3/26  LDL was 88   No complications with anal condyloma surgery with Dr Marlou Starks     ROS: Denies fever, malais, weight loss, blurry vision, decreased visual acuity, cough, sputum, SOB, hemoptysis, pleuritic pain, palpitaitons, heartburn, abdominal pain, melena, lower extremity edema, claudication, or rash.  All other systems reviewed and negative  General: Affect appropriate Healthy:  appears stated age 66: normal Neck supple with no adenopathy JVP normal no bruits no thyromegaly Lungs clear with no wheezing and good diaphragmatic motion Heart:  S1/S2 no murmur, no rub, gallop or click PMI normal Abdomen: benighn, BS positve, no tenderness, no AAA no bruit.  No HSM or HJR Distal pulses intact with no bruits No edema Neuro non-focal Skin warm and dry No muscular weakness   Current Outpatient Prescriptions  Medication Sig Dispense Refill  . aspirin 325 MG tablet Take 325 mg by mouth daily.        Marland Kitchen buPROPion (WELLBUTRIN XL) 300 MG 24 hr tablet       . CIALIS 5 MG tablet       . citalopram (CELEXA) 20 MG tablet TAKE 1 TABLET BY MOUTH EVERY DAY  90 tablet  2  . gabapentin (NEURONTIN) 600 MG tablet Take 600 mg by mouth as needed.       Marland Kitchen  losartan-hydrochlorothiazide (HYZAAR) 100-12.5 MG per tablet TAKE 1 TABLET DAILY *(NEED APPOINTMENT FOR FURTHER REFILLS)  30 tablet  0  . omeprazole (PRILOSEC) 20 MG capsule Take 20 mg by mouth daily.        Marland Kitchen oxyCODONE-acetaminophen (PERCOCET) 10-325 MG per tablet Take 1 tablet by mouth every 4 (four) hours as needed.        . rosuvastatin (CRESTOR) 10 MG tablet Take 10 mg by mouth daily.      Marland Kitchen testosterone (ANDRODERM) 2.5 MG/24HR Place 1 patch onto the skin daily.        Marland Kitchen VYVANSE 40 MG capsule        No current facility-administered medications for this visit.    Allergies  Hydrocodone  Electrocardiogram:  SR rate 90 normal   Assessment and Plan

## 2013-09-19 NOTE — Patient Instructions (Signed)
Encouraged diet exercise and weight loss. Return in 6 months for physical exam.

## 2013-09-19 NOTE — Progress Notes (Signed)
   Subjective:    Patient ID: Johnny [name], male    DOB: 07-24-1950, 63 y.o.   MRN: [id number]  HPI Patient has not been seen here since 2013.  In 2014 he was seen by general surgeons for evaluation of anal condyloma. He actually had anoscopy and was found to have condyloma on endoscopy. He did have some treatment with adopted one but that was not entirely effective. Laser surgery has been recommended but patient has not had this done yet.  He has a history of impaired glucose tolerance but recent labs show hemoglobin A1c to be within normal limits. History of hyperlipidemia and hypertension. History of decreased serum testosterone and erectile dysfunction. History of depression. History of osteopenia and vitamin D deficiency. History of low back pain. Recent lab show total and LDL cholesterol levels to be normal but triglycerides are elevated to 20.  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  Review of Systems     Objective:   Physical Exam He is a bit overweight. Skin warm and dry. Nodes none. No JVD thyromegaly or carotid bruits. Chest clear to auscultation. Cardiac exam regular rate and rhythm. Extremities without edema.       Assessment & Plan:  Hyperlipidemia-elevated triglycerides at 220  Hypertension-stable  History of impaired glucose tolerance but now has normal hemoglobin A1c  History of decreased serum testosterone  History of erectile dysfunction  History of depression  Osteopenia  History of vitamin D deficiency  GERD  History of anal condyloma  History of back pain  Plan: Patient needs to have complete physical exam in the next 6 months. Talked with him about diet and exercise. Needs to lower triglyceride level.

## 2013-12-16 ENCOUNTER — Ambulatory Visit (INDEPENDENT_AMBULATORY_CARE_PROVIDER_SITE_OTHER): Payer: Self-pay | Admitting: Emergency Medicine

## 2013-12-16 VITALS — BP 132/82 | HR 83 | Temp 97.7°F | Resp 16 | Ht 71.0 in | Wt 237.6 lb

## 2013-12-16 DIAGNOSIS — Z0289 Encounter for other administrative examinations: Secondary | ICD-10-CM

## 2013-12-16 NOTE — Progress Notes (Signed)
Subjective:  This chart was scribed for Arlyss Queen, MD by Mercy Moore, Medial Scribe. This patient was seen in room 1 and the patient's care was started at 10:16 AM.    Patient ID: Johnny [name], male    DOB: May 24, 1950, 63 y.o.   MRN: [id number]  HPI HPI Comments: Johnny [name] is a 63 y.o. male who presents to the [facility] requesting DOT physical exam. Patient denies current medical complaints. Patient shares history of back surgery, but states that he will not be required to perform heavy lifting with his current job.   Patient Active Problem List   Diagnosis Date Noted  . 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 Osteopenia 01/20/2012  . Hx of adenomatous colonic polyps 01/20/2012  . GE reflux 01/20/2012  . Vitamin d deficiency 01/20/2012  . Low serum testosterone level 01/20/2012  . Erectile dysfunction 01/20/2012  . Impaired glucose tolerance 01/20/2012  . Chronic back pain 01/20/2012  . Depression 02/01/2011  . ATTENTION OR CONCENTRATION DEFICIT 01/11/2010  . HYPERCHOLESTEROLEMIA 11/30/2008  . HYPERTENSION 11/30/2008  . TIA 11/30/2008  . DEGENERATIVE JOINT DISEASE 11/30/2008   Past Medical History  Diagnosis Date  . Hyperlipidemia   . Hypertension   . GERD (gastroesophageal reflux disease)   . Osteopenia   . Internal hemorrhoids   . Adenomatous polyps   . Low testosterone   . Vitamin D deficiency    History reviewed. No pertinent past surgical history. Allergies  Allergen Reactions  . Hydrocodone Nausea Only   Prior to Admission medications   Medication Sig Start Date End Date Taking? Authorizing Provider  aspirin 325 MG tablet Take 325 mg by mouth daily.     Yes Historical Provider, MD  buPROPion (WELLBUTRIN XL) 300 MG 24 hr tablet  09/01/13  Yes Historical Provider, MD  citalopram (CELEXA) 20 MG tablet TAKE 1 TABLET BY MOUTH EVERY DAY 11/26/12  Yes [name], MD  gabapentin (NEURONTIN) 600 MG tablet Take 600 mg by mouth as needed.    Yes  Historical Provider, MD  losartan-hydrochlorothiazide (HYZAAR) 100-12.5 MG per tablet Take 1 tablet by mouth daily. 09/10/13  Yes [name], MD  oxyCODONE-acetaminophen (PERCOCET) 10-325 MG per tablet Take 1 tablet by mouth every 4 (four) hours as needed.     Yes Historical Provider, MD  rosuvastatin (CRESTOR) 10 MG tablet Take 1 tablet (10 mg total) by mouth daily. 09/10/13  Yes [name], MD  testosterone (ANDROGEL) 50 MG/5GM (1%) GEL Place 5 g onto the skin daily.   Yes Historical Provider, MD  VYVANSE 40 MG capsule  08/10/13  Yes Historical Provider, MD  CIALIS 5 MG tablet  12/27/11   Historical Provider, MD  omeprazole (PRILOSEC) 20 MG capsule Take 20 mg by mouth daily.      Historical Provider, MD   History   Social History  . Marital Status: Married    Spouse Name: N/A    Number of Children: N/A  . Years of Education: N/A   Occupational History  . Not on file.   Social History Main Topics  . Smoking status: Former Smoker -- 10 years    Types: Cigarettes    Quit date: 01/04/1977  . Smokeless tobacco: Never Used  . Alcohol Use: Yes     Comment: rarely  . Drug Use: No  . Sexual Activity: Not on file   Other Topics Concern  . Not on file   Social History Narrative  . No  narrative on file      Review of Systems     Objective:   Physical Exam  CONSTITUTIONAL: Well developed/well nourished HEAD: Normocephalic/atraumatic EYES: EOMI/PERRL ENMT: Mucous membranes moist NECK: supple no meningeal signs SPINE: entire spine nontender; scar over lumbar spine CV: S1/S2 noted, no murmurs/rubs/gallops noted LUNGS: Lungs are clear to auscultation bilaterally, no apparent distress ABDOMEN: soft, nontender, no rebound or guarding GU: no cva tenderness NEURO: Pt is awake/alert, moves all extremitiesx4 EXTREMITIES: pulses normal, full ROM SKIN: warm, color normal PSYCH: no abnormalities of mood noted  Filed Vitals:   12/16/13 0953  BP: 132/82  Pulse: 83  Temp: 97.7  F (36.5 C)  TempSrc: Oral  Resp: 16  Height: 5\' 11"  (1.803 m)  Weight: 237 lb 9.6 oz (107.775 kg)  SpO2: 98%        Assessment & Plan:  Patient 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  I personally performed the services described in this documentation, which was scribed in my presence. The recorded information has been reviewed and is accurate.

## 2014-02-26 ENCOUNTER — Other Ambulatory Visit: Payer: 59 | Admitting: Internal Medicine

## 2014-03-04 ENCOUNTER — Other Ambulatory Visit: Payer: 59 | Admitting: Internal Medicine

## 2014-03-05 ENCOUNTER — Encounter: Payer: 59 | Admitting: Internal Medicine

## 2014-04-29 ENCOUNTER — Encounter: Payer: Self-pay | Admitting: Internal Medicine

## 2014-04-29 ENCOUNTER — Other Ambulatory Visit: Payer: 59 | Admitting: Internal Medicine

## 2014-04-30 ENCOUNTER — Encounter: Payer: 59 | Admitting: Internal Medicine

## 2014-04-30 ENCOUNTER — Encounter: Payer: Self-pay | Admitting: Internal Medicine

## 2014-05-17 ENCOUNTER — Encounter: Payer: Self-pay | Admitting: Internal Medicine

## 2014-06-17 ENCOUNTER — Other Ambulatory Visit: Payer: 59 | Admitting: Internal Medicine

## 2014-06-17 DIAGNOSIS — Z125 Encounter for screening for malignant neoplasm of prostate: Secondary | ICD-10-CM

## 2014-06-17 DIAGNOSIS — Z13220 Encounter for screening for lipoid disorders: Secondary | ICD-10-CM

## 2014-06-17 DIAGNOSIS — Z0000 Encounter for general adult medical examination without abnormal findings: Secondary | ICD-10-CM

## 2014-06-17 LAB — CBC WITH DIFFERENTIAL/PLATELET
BASOS PCT: 0 % (ref 0–1)
Basophils Absolute: 0 10*3/uL (ref 0.0–0.1)
Eosinophils Absolute: 0.1 10*3/uL (ref 0.0–0.7)
Eosinophils Relative: 1 % (ref 0–5)
HEMATOCRIT: 44.1 % (ref 39.0–52.0)
Hemoglobin: 15.2 g/dL (ref 13.0–17.0)
LYMPHS ABS: 2.4 10*3/uL (ref 0.7–4.0)
LYMPHS PCT: 29 % (ref 12–46)
MCH: 32 pg (ref 26.0–34.0)
MCHC: 34.5 g/dL (ref 30.0–36.0)
MCV: 92.8 fL (ref 78.0–100.0)
MONO ABS: 0.9 10*3/uL (ref 0.1–1.0)
MONOS PCT: 11 % (ref 3–12)
MPV: 9.2 fL (ref 8.6–12.4)
NEUTROS ABS: 4.8 10*3/uL (ref 1.7–7.7)
Neutrophils Relative %: 59 % (ref 43–77)
Platelets: 242 10*3/uL (ref 150–400)
RBC: 4.75 MIL/uL (ref 4.22–5.81)
RDW: 14.5 % (ref 11.5–15.5)
WBC: 8.2 10*3/uL (ref 4.0–10.5)

## 2014-06-17 LAB — COMPREHENSIVE METABOLIC PANEL
ALBUMIN: 4.3 g/dL (ref 3.5–5.2)
ALT: 26 U/L (ref 0–53)
AST: 19 U/L (ref 0–37)
Alkaline Phosphatase: 44 U/L (ref 39–117)
BUN: 14 mg/dL (ref 6–23)
CO2: 26 mEq/L (ref 19–32)
CREATININE: 1.02 mg/dL (ref 0.50–1.35)
Calcium: 9.9 mg/dL (ref 8.4–10.5)
Chloride: 101 mEq/L (ref 96–112)
GLUCOSE: 86 mg/dL (ref 70–99)
POTASSIUM: 3.9 meq/L (ref 3.5–5.3)
SODIUM: 137 meq/L (ref 135–145)
TOTAL PROTEIN: 6.9 g/dL (ref 6.0–8.3)
Total Bilirubin: 1 mg/dL (ref 0.2–1.2)

## 2014-06-17 LAB — LIPID PANEL
CHOL/HDL RATIO: 3.8 ratio
Cholesterol: 162 mg/dL (ref 0–200)
HDL: 43 mg/dL (ref >39)
LDL CALC: 83 mg/dL (ref 0–99)
Triglycerides: 182 mg/dL — ABNORMAL HIGH (ref <150)
VLDL: 36 mg/dL (ref 0–40)

## 2014-06-18 ENCOUNTER — Ambulatory Visit (INDEPENDENT_AMBULATORY_CARE_PROVIDER_SITE_OTHER): Payer: 59 | Admitting: Internal Medicine

## 2014-06-18 ENCOUNTER — Encounter: Payer: Self-pay | Admitting: Internal Medicine

## 2014-06-18 VITALS — BP 120/72 | HR 95 | Temp 98.3°F | Wt 241.0 lb

## 2014-06-18 DIAGNOSIS — Z8639 Personal history of other endocrine, nutritional and metabolic disease: Secondary | ICD-10-CM

## 2014-06-18 DIAGNOSIS — R7302 Impaired glucose tolerance (oral): Secondary | ICD-10-CM

## 2014-06-18 DIAGNOSIS — M549 Dorsalgia, unspecified: Secondary | ICD-10-CM

## 2014-06-18 DIAGNOSIS — Z0000 Encounter for general adult medical examination without abnormal findings: Secondary | ICD-10-CM

## 2014-06-18 DIAGNOSIS — E785 Hyperlipidemia, unspecified: Secondary | ICD-10-CM

## 2014-06-18 DIAGNOSIS — G8929 Other chronic pain: Secondary | ICD-10-CM

## 2014-06-18 DIAGNOSIS — F988 Other specified behavioral and emotional disorders with onset usually occurring in childhood and adolescence: Secondary | ICD-10-CM

## 2014-06-18 DIAGNOSIS — I10 Essential (primary) hypertension: Secondary | ICD-10-CM

## 2014-06-18 DIAGNOSIS — K648 Other hemorrhoids: Secondary | ICD-10-CM

## 2014-06-18 DIAGNOSIS — Z86010 Personal history of colonic polyps: Secondary | ICD-10-CM

## 2014-06-18 DIAGNOSIS — M8580 Other specified disorders of bone density and structure, unspecified site: Secondary | ICD-10-CM

## 2014-06-18 DIAGNOSIS — R7989 Other specified abnormal findings of blood chemistry: Secondary | ICD-10-CM

## 2014-06-18 DIAGNOSIS — E291 Testicular hypofunction: Secondary | ICD-10-CM

## 2014-06-18 DIAGNOSIS — K219 Gastro-esophageal reflux disease without esophagitis: Secondary | ICD-10-CM

## 2014-06-18 DIAGNOSIS — N529 Male erectile dysfunction, unspecified: Secondary | ICD-10-CM

## 2014-06-18 DIAGNOSIS — E8881 Metabolic syndrome: Secondary | ICD-10-CM

## 2014-06-18 DIAGNOSIS — F909 Attention-deficit hyperactivity disorder, unspecified type: Secondary | ICD-10-CM

## 2014-06-18 DIAGNOSIS — E669 Obesity, unspecified: Secondary | ICD-10-CM

## 2014-06-18 LAB — PSA: PSA: 0.86 ng/mL (ref <=4.00)

## 2014-06-18 LAB — POCT URINALYSIS DIPSTICK
BILIRUBIN UA: NEGATIVE
Glucose, UA: NEGATIVE
KETONES UA: NEGATIVE
LEUKOCYTES UA: NEGATIVE
Nitrite, UA: NEGATIVE
PROTEIN UA: NEGATIVE
RBC UA: NEGATIVE
SPEC GRAV UA: 1.015
Urobilinogen, UA: NEGATIVE
pH, UA: 6

## 2014-06-20 NOTE — Progress Notes (Signed)
   Subjective:    Patient ID: Johnny [name], male    DOB: Dec 10, 1950, 64 y.o.   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 He has a history of osteopenia, hypertension, hyperlipidemia, internal hemorrhoids, adenomatous colon polyps, low testosterone, GE reflux, vitamin D deficiency and prediabetes.  He had anal warts removed in June 2005. He had back surgery by Dr. Carloyn Manner February 2010. 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 He had an upper endoscopy done by Dr. 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 History of adenomatous colon polyps on colonoscopy in July 2005. Repeat study June 2010 was normal.  He has seen Dr. Verda Cumins in the past for cardiology evaluation. Had treadmill test in 2007 which was normal.  Social history: He is married. Wife has multiple sclerosis. He does not smoke. Very occasional alcohol consumption. 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 3 adult children.  Family history: Father died at 25 due to convocation's of COPD. Mother died at age 56 with an MI with history of CVA. Maternal aunt with hypertension. One brother and one sister in good health.    Review of Systems  Constitutional: Negative.   All other systems reviewed and are negative.      Objective:   Physical Exam  Constitutional: He is oriented to person, place, and time. He appears well-developed and well-nourished. No distress.  HENT:  Head: Normocephalic and atraumatic.  Right Ear: External ear normal.  Left Ear: External ear normal.  Mouth/Throat: No oropharyngeal exudate.  Eyes: Conjunctivae and EOM are normal. Pupils are equal, round, and reactive to light. Left eye exhibits no discharge.  Neck: Neck supple. No JVD present. No thyromegaly present.  Cardiovascular: Normal  rate, regular rhythm, normal heart sounds and intact distal pulses.   No murmur heard. Pulmonary/Chest: Effort normal and breath sounds normal. No respiratory distress. He has no wheezes. He has no rales.  Abdominal: Soft. Bowel sounds are normal. He exhibits no distension and no mass. There is no tenderness. There is no rebound and no guarding.  Genitourinary:  Deferred at his request to urologist  Musculoskeletal: Normal range of motion. He exhibits no edema.  Lymphadenopathy:    He has no cervical adenopathy.  Neurological: He is alert and oriented to person, place, and time. He has normal reflexes. No cranial nerve deficit. Coordination normal.  Skin: Skin is warm and dry. No rash noted. He is not diaphoretic.  Psychiatric: He has a normal mood and affect. His behavior is normal. Judgment and thought content normal.  Vitals reviewed.         Assessment & Plan:  Hypertension-stable  Hyperlipidemia-stable. Triglycerides elevated at 182. Needs to diet exercise and lose weight  Obesity-needs to lose at least 20 pounds and last hemoglobin A1c March 2015 was stable  Osteopenia  Metabolic syndrome  History of vitamin D deficiency  GE reflux  History of low testosterone followed by urology  Internal hemorrhoids  Adenomatous colon polyps  History of chronic back pain status post lumbar back surgery 2010. Takes chronic narcotic pain medication  Attention deficit -is taking Vyvanse  Plan: Return in one year or as needed. Continue same medications.

## 2014-06-20 NOTE — Patient Instructions (Signed)
Continue same medications and return in one year. Diet exercise and weight loss encouraged.

## 2014-07-01 ENCOUNTER — Emergency Department (HOSPITAL_COMMUNITY): Payer: 59

## 2014-07-01 ENCOUNTER — Observation Stay (HOSPITAL_COMMUNITY)
Admission: EM | Admit: 2014-07-01 | Discharge: 2014-07-02 | Disposition: A | Discharge status: Home / Self Care | Payer: 59 | Attending: Internal Medicine | Admitting: Internal Medicine

## 2014-07-01 ENCOUNTER — Observation Stay (HOSPITAL_COMMUNITY): Payer: 59

## 2014-07-01 ENCOUNTER — Telehealth: Payer: Self-pay | Admitting: Internal Medicine

## 2014-07-01 ENCOUNTER — Encounter (HOSPITAL_COMMUNITY): Payer: Self-pay | Admitting: Emergency Medicine

## 2014-07-01 DIAGNOSIS — E785 Hyperlipidemia, unspecified: Secondary | ICD-10-CM | POA: Insufficient documentation

## 2014-07-01 DIAGNOSIS — Z87891 Personal history of nicotine dependence: Secondary | ICD-10-CM | POA: Insufficient documentation

## 2014-07-01 DIAGNOSIS — R7302 Impaired glucose tolerance (oral): Secondary | ICD-10-CM

## 2014-07-01 DIAGNOSIS — E559 Vitamin D deficiency, unspecified: Secondary | ICD-10-CM | POA: Diagnosis not present

## 2014-07-01 DIAGNOSIS — E7800 Pure hypercholesterolemia, unspecified: Secondary | ICD-10-CM | POA: Diagnosis present

## 2014-07-01 DIAGNOSIS — Z79899 Other long term (current) drug therapy: Secondary | ICD-10-CM | POA: Insufficient documentation

## 2014-07-01 DIAGNOSIS — G8929 Other chronic pain: Secondary | ICD-10-CM

## 2014-07-01 DIAGNOSIS — F32A Depression, unspecified: Secondary | ICD-10-CM

## 2014-07-01 DIAGNOSIS — M8580 Other specified disorders of bone density and structure, unspecified site: Secondary | ICD-10-CM | POA: Diagnosis not present

## 2014-07-01 DIAGNOSIS — K219 Gastro-esophageal reflux disease without esophagitis: Secondary | ICD-10-CM | POA: Insufficient documentation

## 2014-07-01 DIAGNOSIS — R41840 Attention and concentration deficit: Secondary | ICD-10-CM

## 2014-07-01 DIAGNOSIS — Z8673 Personal history of transient ischemic attack (TIA), and cerebral infarction without residual deficits: Secondary | ICD-10-CM | POA: Diagnosis not present

## 2014-07-01 DIAGNOSIS — F329 Major depressive disorder, single episode, unspecified: Secondary | ICD-10-CM

## 2014-07-01 DIAGNOSIS — Z86010 Personal history of colonic polyps: Secondary | ICD-10-CM

## 2014-07-01 DIAGNOSIS — G459 Transient cerebral ischemic attack, unspecified: Secondary | ICD-10-CM | POA: Diagnosis not present

## 2014-07-01 DIAGNOSIS — Z7982 Long term (current) use of aspirin: Secondary | ICD-10-CM | POA: Diagnosis not present

## 2014-07-01 DIAGNOSIS — H538 Other visual disturbances: Secondary | ICD-10-CM | POA: Diagnosis present

## 2014-07-01 DIAGNOSIS — I10 Essential (primary) hypertension: Secondary | ICD-10-CM | POA: Insufficient documentation

## 2014-07-01 DIAGNOSIS — A630 Anogenital (venereal) warts: Secondary | ICD-10-CM

## 2014-07-01 DIAGNOSIS — N529 Male erectile dysfunction, unspecified: Secondary | ICD-10-CM

## 2014-07-01 DIAGNOSIS — M549 Dorsalgia, unspecified: Secondary | ICD-10-CM

## 2014-07-01 DIAGNOSIS — R7989 Other specified abnormal findings of blood chemistry: Secondary | ICD-10-CM

## 2014-07-01 LAB — CBC
HCT: 43.9 % (ref 39.0–52.0)
HEMOGLOBIN: 15.1 g/dL (ref 13.0–17.0)
MCH: 32.2 pg (ref 26.0–34.0)
MCHC: 34.4 g/dL (ref 30.0–36.0)
MCV: 93.6 fL (ref 78.0–100.0)
PLATELETS: 235 10*3/uL (ref 150–400)
RBC: 4.69 MIL/uL (ref 4.22–5.81)
RDW: 12.8 % (ref 11.5–15.5)
WBC: 7 10*3/uL (ref 4.0–10.5)

## 2014-07-01 LAB — CBG MONITORING, ED: Glucose-Capillary: 88 mg/dL (ref 70–99)

## 2014-07-01 LAB — COMPREHENSIVE METABOLIC PANEL
ALBUMIN: 4.2 g/dL (ref 3.5–5.2)
ALT: 33 U/L (ref 0–53)
ANION GAP: 7 (ref 5–15)
AST: 29 U/L (ref 0–37)
Alkaline Phosphatase: 55 U/L (ref 39–117)
BUN: 13 mg/dL (ref 6–23)
CHLORIDE: 102 mmol/L (ref 96–112)
CO2: 27 mmol/L (ref 19–32)
CREATININE: 1.17 mg/dL (ref 0.50–1.35)
Calcium: 10 mg/dL (ref 8.4–10.5)
GFR calc Af Amer: 76 mL/min — ABNORMAL LOW (ref >90)
GFR, EST NON AFRICAN AMERICAN: 66 mL/min — AB (ref >90)
Glucose, Bld: 127 mg/dL — ABNORMAL HIGH (ref 70–99)
Potassium: 4.1 mmol/L (ref 3.5–5.1)
Sodium: 136 mmol/L (ref 135–145)
Total Bilirubin: 0.8 mg/dL (ref 0.3–1.2)
Total Protein: 6.6 g/dL (ref 6.0–8.3)

## 2014-07-01 LAB — DIFFERENTIAL
Basophils Absolute: 0 10*3/uL (ref 0.0–0.1)
Basophils Relative: 1 % (ref 0–1)
Eosinophils Absolute: 0.1 10*3/uL (ref 0.0–0.7)
Eosinophils Relative: 2 % (ref 0–5)
Lymphocytes Relative: 32 % (ref 12–46)
Lymphs Abs: 2.3 10*3/uL (ref 0.7–4.0)
Monocytes Absolute: 0.6 10*3/uL (ref 0.1–1.0)
Monocytes Relative: 9 % (ref 3–12)
Neutro Abs: 4 10*3/uL (ref 1.7–7.7)
Neutrophils Relative %: 56 % (ref 43–77)

## 2014-07-01 LAB — PROTIME-INR
INR: 0.94 (ref 0.00–1.49)
Prothrombin Time: 12.6 seconds (ref 11.6–15.2)

## 2014-07-01 LAB — I-STAT TROPONIN, ED: TROPONIN I, POC: 0.01 ng/mL (ref 0.00–0.08)

## 2014-07-01 LAB — APTT: aPTT: 31 seconds (ref 24–37)

## 2014-07-01 MED ORDER — LOSARTAN POTASSIUM 50 MG PO TABS: 100.0000 mg | ORAL_TABLET | Freq: Every day | ORAL | Status: DC

## 2014-07-01 MED ORDER — ASPIRIN 325 MG PO TABS
325.0000 mg | ORAL_TABLET | Freq: Every day | ORAL | Status: DC
  Administered 2014-07-02: 325 mg via ORAL
  Filled 2014-07-01: qty 1

## 2014-07-01 MED ORDER — GABAPENTIN 300 MG PO CAPS
300.0000 mg | ORAL_CAPSULE | Freq: Three times a day (TID) | ORAL | Status: DC
  Administered 2014-07-01 – 2014-07-02 (×3): 300 mg via ORAL
  Filled 2014-07-01 (×3): qty 1

## 2014-07-01 MED ORDER — LISDEXAMFETAMINE DIMESYLATE 30 MG PO CAPS
60.0000 mg | ORAL_CAPSULE | Freq: Every day | ORAL | Status: DC
  Administered 2014-07-02: 60 mg via ORAL
  Filled 2014-07-01: qty 2

## 2014-07-01 MED ORDER — ROSUVASTATIN CALCIUM 10 MG PO TABS
10.0000 mg | ORAL_TABLET | Freq: Every day | ORAL | Status: DC
  Administered 2014-07-02: 10 mg via ORAL
  Filled 2014-07-01 (×3): qty 1

## 2014-07-01 MED ORDER — STROKE: EARLY STAGES OF RECOVERY BOOK
Freq: Once | Status: AC
  Administered 2014-07-02: 01:00:00

## 2014-07-01 MED ORDER — LOSARTAN POTASSIUM-HCTZ 100-12.5 MG PO TABS: 1.0000 | ORAL_TABLET | Freq: Every day | ORAL | Status: DC

## 2014-07-01 MED ORDER — CITALOPRAM HYDROBROMIDE 10 MG PO TABS
20.0000 mg | ORAL_TABLET | Freq: Every day | ORAL | Status: DC
  Administered 2014-07-02: 20 mg via ORAL
  Filled 2014-07-01: qty 2

## 2014-07-01 MED ORDER — SODIUM CHLORIDE 0.9 % IV SOLN
INTRAVENOUS | Status: DC
  Administered 2014-07-02: via INTRAVENOUS

## 2014-07-01 MED ORDER — PANTOPRAZOLE SODIUM 40 MG PO TBEC
40.0000 mg | DELAYED_RELEASE_TABLET | Freq: Every day | ORAL | Status: DC
  Administered 2014-07-02: 40 mg via ORAL
  Filled 2014-07-01: qty 1

## 2014-07-01 MED ORDER — HYDROCHLOROTHIAZIDE 12.5 MG PO CAPS
12.5000 mg | ORAL_CAPSULE | Freq: Every day | ORAL | Status: DC
  Administered 2014-07-02: 12.5 mg via ORAL
  Filled 2014-07-01: qty 1

## 2014-07-01 MED ORDER — HEPARIN SODIUM (PORCINE) 5000 UNIT/ML IJ SOLN
5000.0000 [IU] | Freq: Three times a day (TID) | INTRAMUSCULAR | Status: DC
Start: 2014-07-01 — End: 2014-07-02
  Administered 2014-07-01 – 2014-07-02 (×3): 5000 [IU] via SUBCUTANEOUS
  Filled 2014-07-01 (×3): qty 1

## 2014-07-01 MED ORDER — CYCLOBENZAPRINE HCL 10 MG PO TABS: 10.0000 mg | ORAL_TABLET | Freq: Three times a day (TID) | ORAL | Status: DC

## 2014-07-01 NOTE — ED Notes (Signed)
Patient transported to MRI 

## 2014-07-01 NOTE — Telephone Encounter (Signed)
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  Patient then got on the phone.  States he pulled to the side of the road as quickly as he could.  He did not experience any difficulty with speech, no numbness or tingling of face, no trouble with walking.  His wife came and picked him up from work.  They then called the office.    I spoke with Dr. Renold Genta and she advised for patient to go to the ED.    Called patient back and advised him to go to ED and they will called the Neurologist Hospitalist on-call in to see him and r/o CVA or TIA.  Patient verbalized understanding of these instructions.

## 2014-07-01 NOTE — ED Notes (Signed)
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 Pt also reports generalized weakness and and feels like his tought process is slower than normal. No facial droop, no drift. Pt c.o headache.

## 2014-07-01 NOTE — H&P (Signed)
[facility] History and Physical  Johnny [name]:[id number] DOB: 1951-04-15 DOA: 07/01/2014  Referring physician: Comer Locket, PA PCP: [name], MD   Chief Complaint: Blurred Vision  HPI: Johnny [name] is a 64 y.o. male presents with blurred vision.  Patient states that he was driving on the road and he states that he had a sudden blurring of his vision and he also felt like everything around him was spinning. He states that he has developed a headache now and felt that it was coming on earlier. He has a prior history of migraines in the past. He states that he had a TIA in 2008. At that time he felt as though he was going blind in one eye and it was short lived. He states there is no chest pain. He has no LOC noted. He states that he has had no \\fevers  or chills. He has no history of seizures. He states that he has had a couple of episodes of migraines in the past. He does have a history of falling out of a pick up truck long time ago.    Review of Systems:  Constitutional:  No weight loss, night sweats, Fevers, chills, fatigue.  HEENT:  +headaches, Difficulty swallowing,Tooth/dental problems,Sore throat,  No sneezing Cardio-vascular:  No chest pain, Orthopnea, PND, swelling in lower extremities, anasarca, ++dizziness, ++palpitations  GI:  No heartburn, indigestion, abdominal pain, nausea, vomiting, diarrhea, change in bowel habits Resp:  ++shortness of breath with exertion. No excess mucus, no productive cough, No non-productive cough, No coughing up of blood.No change in color of mucus Skin:  no rash or lesions GU:  no dysuria, change in color of urine, no urgency or frequency Musculoskeletal:  No joint pain or swelling. No decreased range of motion Psych:  No change in mood or affect. No depression or anxiety. ++ memory loss.   Past Medical History  Diagnosis Date  . Hyperlipidemia   . Hypertension   . GERD (gastroesophageal reflux disease)   .  Osteopenia   . Internal hemorrhoids   . Adenomatous polyps   . Low testosterone   . Vitamin D deficiency    History reviewed. No pertinent past surgical history. Social History:  reports that he quit smoking about 37 years ago. His smoking use included Cigarettes. He quit after 10 years of use. He has never used smokeless tobacco. He reports that he drinks alcohol. He reports that he does not use illicit drugs.  Allergies  Allergen Reactions  . Hydrocodone Nausea Only    Family History  Problem Relation Age of Onset  . Heart disease Mother   . Stroke Mother      Prior to Admission medications   Medication Sig Start Date End Date Taking? Authorizing Provider  aspirin 325 MG tablet Take 325 mg by mouth daily.     Yes Historical Provider, MD  buPROPion (WELLBUTRIN XL) 300 MG 24 hr tablet  09/01/13  Yes Historical Provider, MD  citalopram (CELEXA) 20 MG tablet TAKE 1 TABLET BY MOUTH EVERY DAY 11/26/12  Yes [name], MD  cyclobenzaprine (FLEXERIL) 10 MG tablet Take 10 mg by mouth every 8 (eight) hours. 05/25/14  Yes Historical Provider, MD  gabapentin (NEURONTIN) 300 MG capsule Take 300 mg by mouth 3 (three) times daily. 05/09/14  Yes Historical Provider, MD  losartan-hydrochlorothiazide (HYZAAR) 100-12.5 MG per tablet Take 1 tablet by mouth daily. 09/10/13  Yes [name], MD  omeprazole (PRILOSEC) 20 MG capsule Take 20 mg by  mouth daily.     Yes Historical Provider, MD  oxyCODONE-acetaminophen (PERCOCET) 10-325 MG per tablet Take 1 tablet by mouth every 4 (four) hours as needed.     Yes Historical Provider, MD  rosuvastatin (CRESTOR) 10 MG tablet Take 1 tablet (10 mg total) by mouth daily. 09/10/13  Yes [name], MD  testosterone (ANDROGEL) 50 MG/5GM (1%) GEL Place 5 g onto the skin daily.   Yes Historical Provider, MD  VYVANSE 60 MG capsule Take 60 mg by mouth daily. 06/09/14  Yes Historical Provider, MD  CIALIS 5 MG tablet  12/27/11   Historical Provider, MD  VYVANSE 40 MG  capsule  08/10/13   Historical Provider, MD   Physical Exam: Filed Vitals:   07/01/14 1930 07/01/14 2000 07/01/14 2014 07/01/14 2030  BP: 127/76 124/92  110/77  Pulse: 73 72  73  Temp:   97.8 F (36.6 C)   TempSrc:      Resp: 14 16  17   Height:      Weight:      SpO2: 93% 94%  96%    Wt Readings from Last 3 Encounters:  07/01/14 107.049 kg (236 lb)  06/18/14 109.317 kg (241 lb)  12/16/13 107.775 kg (237 lb 9.6 oz)    General:  Appears calm and comfortable Eyes: PERRL, normal lids, irises & conjunctiva ENT: grossly normal hearing, lips & tongue Neck: no LAD, masses or thyromegaly Cardiovascular: RRR, no m/r/g. No LE edema. Respiratory: CTA bilaterally, no w/r/r. Normal respiratory effort. Abdomen: soft, ntnd Skin: no rash or induration seen on limited exam Musculoskeletal: grossly normal tone BUE/BLE Psychiatric: grossly normal mood and affect, speech fluent and appropriate Neurologic: grossly non-focal.          Labs on Admission:  Basic Metabolic Panel:  Recent Labs Lab 07/01/14 1659  NA 136  K 4.1  CL 102  CO2 27  GLUCOSE 127*  BUN 13  CREATININE 1.17  CALCIUM 10.0   Liver Function Tests:  Recent Labs Lab 07/01/14 1659  AST 29  ALT 33  ALKPHOS 55  BILITOT 0.8  PROT 6.6  ALBUMIN 4.2   No results for input(s): LIPASE, AMYLASE in the last 168 hours. No results for input(s): AMMONIA in the last 168 hours. CBC:  Recent Labs Lab 07/01/14 1659  WBC 7.0  NEUTROABS 4.0  HGB 15.1  HCT 43.9  MCV 93.6  PLT 235   Cardiac Enzymes: No results for input(s): CKTOTAL, CKMB, CKMBINDEX, TROPONINI in the last 168 hours.  BNP (last 3 results) No results for input(s): BNP in the last 8760 hours.  ProBNP (last 3 results) No results for input(s): PROBNP in the last 8760 hours.  CBG:  Recent Labs Lab 07/01/14 1924  GLUCAP 88    Radiological Exams on Admission: Ct Head (brain) Wo Contrast  07/01/2014   CLINICAL DATA:  Sudden onset of vision  changes lasting approximately 15 min. This happened while driving at 11 a.m. Symptoms gradually went away. Also generalized weakness and slow thought process. Headache.  EXAM: CT HEAD WITHOUT CONTRAST  TECHNIQUE: Contiguous axial images were obtained from the base of the skull through the vertex without intravenous contrast.  COMPARISON:  CT head 04/30/2007.  MRI brain 05/01/2007.  FINDINGS: Mild diffuse cerebral atrophy. Patchy low-attenuation changes in the deep white matter consistent with small vessel ischemia. No mass effect or midline shift. No abnormal extra-axial fluid collections. Gray-white matter junctions are distinct. Basal cisterns are not effaced. No evidence of acute intracranial hemorrhage.  No depressed skull fractures. Visualized paranasal sinuses and mastoid air cells are not opacified.  IMPRESSION: No acute intracranial abnormalities. Mild diffuse atrophy and small vessel ischemic changes.   Electronically Signed   By: Lucienne Capers M.D.   On: 07/01/2014 17:48     Assessment/Plan Active Problems:   HYPERCHOLESTEROLEMIA   Essential hypertension   TIA (transient ischemic attack)   1. TIA -now resolved -will check carotid doppler/echo -check MRI -will get A1C -ASA 325mg   2. HTN -pressure is controlled -monitor pressures  3. Hypercholesterolemia -continue with statins    Code Status: Full Code (must indicate code status--if unknown or must be presumed, indicate so) DVT Prophylaxis:Heparin Family Communication: Wife (indicate person spoken with, if applicable, with phone number if by telephone) Disposition Plan: Home(indicate anticipated LOS)  Time spent: 68min  KHAN,SAADAT A [facility] Pager [phone]

## 2014-07-01 NOTE — ED Provider Notes (Signed)
CSN: [id number]     Arrival date & time 07/01/14  1627 History   First MD Initiated Contact with Patient 07/01/14 1827     Chief Complaint  Patient presents with  . Blurred Vision     (Consider location/radiation/quality/duration/timing/severity/associated sxs/prior Treatment) HPI Johnny [name] is a 64 y.o. male with history of TIA in 2008, hypertension, hyperlipidemia comes in for evaluation of blurred vision. Patient states he was driving in the car this morning and at approximately 11 AM he began to feel lightheaded, experienced bilateral vision loss and then blurred vision. He reports the vision loss was fleeting and at the blurred vision was more a spinning sensation where things appeared tilted or rotating on their side. This episode lasted for approximately 15 minutes.  He reports for the rest of the day anytime he would stand up he would become dizzy. He also reports he felt like his cognitive function was decreased during this episode, but quickly resolved. He denies any difficulties speaking, facial droop. Family in the room reports that he is acting at his baseline and they deny any difficulties with speech earlier in the day. He denies headache, tinnitus, chest pain, shortness of breath, numbness or weakness, syncope. Past Medical History  Diagnosis Date  . Hyperlipidemia   . Hypertension   . GERD (gastroesophageal reflux disease)   . Osteopenia   . Internal hemorrhoids   . Adenomatous polyps   . Low testosterone   . Vitamin D deficiency    History reviewed. No pertinent past surgical history. Family History  Problem Relation Age of Onset  . Heart disease Mother   . Stroke Mother    History  Substance Use Topics  . Smoking status: Former Smoker -- 10 years    Types: Cigarettes    Quit date: 01/04/1977  . Smokeless tobacco: Never Used  . Alcohol Use: Yes     Comment: rarely    Review of Systems  All other systems reviewed and are negative.     Allergies   Hydrocodone  Home Medications   Prior to Admission medications   Medication Sig Start Date End Date Taking? Authorizing Provider  aspirin 325 MG tablet Take 325 mg by mouth daily.      Historical Provider, MD  buPROPion (WELLBUTRIN XL) 300 MG 24 hr tablet  09/01/13   Historical Provider, MD  CIALIS 5 MG tablet  12/27/11   Historical Provider, MD  citalopram (CELEXA) 20 MG tablet TAKE 1 TABLET BY MOUTH EVERY DAY 11/26/12   [name], MD  cyclobenzaprine (FLEXERIL) 10 MG tablet Take 10 mg by mouth every 8 (eight) hours. 05/25/14   Historical Provider, MD  gabapentin (NEURONTIN) 300 MG capsule Take 300 mg by mouth 3 (three) times daily. 05/09/14   Historical Provider, MD  gabapentin (NEURONTIN) 600 MG tablet Take 600 mg by mouth as needed.     Historical Provider, MD  losartan-hydrochlorothiazide (HYZAAR) 100-12.5 MG per tablet Take 1 tablet by mouth daily. 09/10/13   [name], MD  omeprazole (PRILOSEC) 20 MG capsule Take 20 mg by mouth daily.      Historical Provider, MD  oxyCODONE-acetaminophen (PERCOCET) 10-325 MG per tablet Take 1 tablet by mouth every 4 (four) hours as needed.      Historical Provider, MD  rosuvastatin (CRESTOR) 10 MG tablet Take 1 tablet (10 mg total) by mouth daily. 09/10/13   [name], MD  testosterone (ANDROGEL) 50 MG/5GM (1%) GEL Place 5 g onto the skin daily.  Historical Provider, MD  VYVANSE 40 MG capsule  08/10/13   Historical Provider, MD  VYVANSE 60 MG capsule Take 60 mg by mouth daily. 06/09/14   Historical Provider, MD   BP 135/82 mmHg  Pulse 78  Temp(Src) 98.1 F (36.7 C) (Oral)  Resp 20  Ht 6' (1.829 m)  Wt 236 lb (107.049 kg)  BMI 32.00 kg/m2  SpO2 97% Physical Exam  Constitutional: He is oriented to person, place, and time. He appears well-developed and well-nourished.  HENT:  Head: Normocephalic and atraumatic.  Mouth/Throat: Oropharynx is clear and moist.  Eyes: Conjunctivae are normal. Pupils are equal, round, and reactive to light.  Right eye exhibits no discharge. Left eye exhibits no discharge. No scleral icterus.  Neck: Neck supple.  Cardiovascular: Normal rate, regular rhythm and normal heart sounds.   Pulmonary/Chest: Effort normal and breath sounds normal. No respiratory distress. He has no wheezes. He has no rales.  Abdominal: Soft. There is no tenderness.  Musculoskeletal: He exhibits no tenderness.  Neurological: He is alert and oriented to person, place, and time.  Cranial Nerves II-XII grossly intact. Motor and sensation 5/5 in all 4 extremities. Extraocular movements intact without nystatin is. Completes finger to nose and Heel-Shin coordination movements without difficulty. Visual fields appear to be intact.   Skin: Skin is warm and dry. No rash noted.  Psychiatric: He has a normal mood and affect.  Nursing note and vitals reviewed.   ED Course  Procedures (including critical care time) Labs Review Labs Reviewed  COMPREHENSIVE METABOLIC PANEL - Abnormal; Notable for the following:    Glucose, Bld 127 (*)    GFR calc non Af Amer 66 (*)    GFR calc Af Amer 76 (*)    All other components within normal limits  PROTIME-INR  APTT  CBC  DIFFERENTIAL  I-STAT CHEM 8, ED  I-STAT TROPOININ, ED  CBG MONITORING, ED    Imaging Review Ct Head (brain) Wo Contrast  07/01/2014   CLINICAL DATA:  Sudden onset of vision changes lasting approximately 15 min. This happened while driving at 11 a.m. Symptoms gradually went away. Also generalized weakness and slow thought process. Headache.  EXAM: CT HEAD WITHOUT CONTRAST  TECHNIQUE: Contiguous axial images were obtained from the base of the skull through the vertex without intravenous contrast.  COMPARISON:  CT head 04/30/2007.  MRI brain 05/01/2007.  FINDINGS: Mild diffuse cerebral atrophy. Patchy low-attenuation changes in the deep white matter consistent with small vessel ischemia. No mass effect or midline shift. No abnormal extra-axial fluid collections. Gray-white  matter junctions are distinct. Basal cisterns are not effaced. No evidence of acute intracranial hemorrhage. No depressed skull fractures. Visualized paranasal sinuses and mastoid air cells are not opacified.  IMPRESSION: No acute intracranial abnormalities. Mild diffuse atrophy and small vessel ischemic changes.   Electronically Signed   By: Lucienne Capers M.D.   On: 07/01/2014 17:48     EKG Interpretation None     Meds given in ED:  Medications - No data to display  New Prescriptions   No medications on file   Filed Vitals:   07/01/14 1648 07/01/14 1823  BP: 153/95 135/82  Pulse: 81 78  Temp: 98.1 F (36.7 C)   TempSrc: Oral   Resp: 18 20  Height: 6' (1.829 m)   Weight: 236 lb (107.049 kg)   SpO2: 95% 97%    MDM  Vitals stable - WNL -afebrile Pt resting comfortably in ED. reports he is asymptomatic at this  time. PE--no focal neurodeficits. Labwork noncontributory Imaging-CT head shows no acute intracranial pathology. Mild diffuse atrophy with chronic changes. Pending MR brain.  Due to patient's history of TIA 2008, hypertension, hyperlipidemia and symptoms from today, we will consult internal medicine for admission and TIA workup. Prior to admission, I discussed and reviewed this case with attending, Dr. Canary Brim.  Dr. Humphrey Rolls to see in ED.   Final diagnoses:  TIA (transient ischemic attack)       Verl Dicker, PA-C 07/02/14 [place], MD 07/03/14 [phone]

## 2014-07-01 NOTE — ED Notes (Signed)
PA at bedside.

## 2014-07-01 NOTE — ED Notes (Signed)
CBG 88 

## 2014-07-02 ENCOUNTER — Observation Stay (HOSPITAL_COMMUNITY): Payer: 59

## 2014-07-02 DIAGNOSIS — G459 Transient cerebral ischemic attack, unspecified: Secondary | ICD-10-CM

## 2014-07-02 DIAGNOSIS — E785 Hyperlipidemia, unspecified: Secondary | ICD-10-CM | POA: Diagnosis not present

## 2014-07-02 DIAGNOSIS — E780 Pure hypercholesterolemia: Secondary | ICD-10-CM | POA: Diagnosis not present

## 2014-07-02 DIAGNOSIS — I10 Essential (primary) hypertension: Secondary | ICD-10-CM | POA: Diagnosis not present

## 2014-07-02 LAB — RAPID URINE DRUG SCREEN, HOSP PERFORMED
Amphetamines: POSITIVE — AB
BARBITURATES: NOT DETECTED
Benzodiazepines: NOT DETECTED
Cocaine: NOT DETECTED
Opiates: POSITIVE — AB
TETRAHYDROCANNABINOL: NOT DETECTED

## 2014-07-02 LAB — GLUCOSE, CAPILLARY
GLUCOSE-CAPILLARY: 112 mg/dL — AB (ref 70–99)
Glucose-Capillary: 81 mg/dL (ref 70–99)
Glucose-Capillary: 99 mg/dL (ref 70–99)

## 2014-07-02 LAB — LIPID PANEL
CHOL/HDL RATIO: 5.1 ratio
Cholesterol: 185 mg/dL (ref 0–200)
HDL: 36 mg/dL — ABNORMAL LOW (ref >39)
LDL CALC: 75 mg/dL (ref 0–99)
Triglycerides: 372 mg/dL — ABNORMAL HIGH (ref <150)
VLDL: 74 mg/dL — AB (ref 0–40)

## 2014-07-02 MED ORDER — ACETAMINOPHEN 325 MG PO TABS
650.0000 mg | ORAL_TABLET | ORAL | Status: DC | PRN
  Administered 2014-07-02: 650 mg via ORAL
  Filled 2014-07-02: qty 2

## 2014-07-02 MED ORDER — OXYCODONE-ACETAMINOPHEN 5-325 MG PO TABS
2.0000 | ORAL_TABLET | Freq: Once | ORAL | Status: AC
  Administered 2014-07-02: 2 via ORAL
  Filled 2014-07-02: qty 2

## 2014-07-02 MED ORDER — CLOPIDOGREL BISULFATE 75 MG PO TABS: 75.0000 mg | ORAL_TABLET | Freq: Every day | ORAL | Status: DC

## 2014-07-02 MED ORDER — CLOPIDOGREL BISULFATE 75 MG PO TABS
75.0000 mg | ORAL_TABLET | Freq: Every day | ORAL | Status: DC
  Administered 2014-07-02: 75 mg via ORAL
  Filled 2014-07-02: qty 1

## 2014-07-02 NOTE — Progress Notes (Signed)
Patient came in from ED via stretcher. A&Ox4, VSS. Oriented to call bell system & room information. Patient received educational materials, given opportunity to ask questions. Will continue to monitor.

## 2014-07-02 NOTE — Progress Notes (Signed)
UR completed 

## 2014-07-02 NOTE — Evaluation (Signed)
Physical Therapy Evaluation/Discharge Patient Details Name: Johnny [name] MRN: [id number] DOB: 1950-10-27 Today's Date: 07/02/2014   History of Present Illness  64 y.o. male admitted to [facility] on 07/01/14 for sudden onset of dizziness.  Dx with TIA, stroke and cardiac workup in progress. MRI negative.    Clinical Impression  Pt is independent with all mobility. Balance is WNL.  Basic vestibular screen preformed, also WNL.  Pt no longer having symptoms.  PT to sign off.  Pt safe to d/c home with wife with no further acute or f/u PT needs at this time.      Follow Up Recommendations No PT follow up    Equipment Recommendations  None recommended by PT    Recommendations for Other Services   NA    Precautions / Restrictions   none     Mobility  Bed Mobility Overal bed mobility: Independent                Transfers Overall transfer level: Independent                  Ambulation/Gait Ambulation/Gait assistance: Independent Ambulation Distance (Feet): 510 Feet Assistive device: None Gait Pattern/deviations: WFL(Within Functional Limits)   Gait velocity interpretation: at or above normal speed for age/gender    Stairs Stairs: Yes Stairs assistance: Independent Stair Management: No rails Number of Stairs: 4 General stair comments: reciprocal, pt was practically running.  Limited stair # due to IV line   Modified Rankin (Stroke Patients Only) Modified Rankin (Stroke Patients Only) Pre-Morbid Rankin Score: No symptoms Modified Rankin: No symptoms     Balance Overall balance assessment: Independent                               Standardized Balance Assessment Standardized Balance Assessment : Dynamic Gait Index   Dynamic Gait Index Level Surface: Normal Change in Gait Speed: Normal Gait with Horizontal Head Turns: Normal Gait with Vertical Head Turns: Normal Gait and Pivot Turn: Normal Step Over Obstacle: Normal Step Around Obstacles:  Normal Steps: Normal Total Score: 24       Pertinent Vitals/Pain Pain Assessment: 0-10 Pain Score: 4  Pain Location: chronic low back pain Pain Descriptors / Indicators: Aching Pain Intervention(s): Limited activity within patient's tolerance;Monitored during session;Repositioned    Home Living Family/patient expects to be discharged to:: Private residence Living Arrangements: Spouse/significant other Available Help at Discharge: Family;Available PRN/intermittently Type of Home: House Home Access: Stairs to enter Entrance Stairs-Rails: Psychiatric nurse of Steps: 5 Home Layout: Two level   Additional Comments: wears reading glasses     Prior Function Level of Independence: Independent         Comments: pt is retired IT trainer, drives now for [place]: Right    Extremity/Trunk Assessment   Upper Extremity Assessment: Overall WFL for tasks assessed           Lower Extremity Assessment: Overall WFL for tasks assessed      Cervical / Trunk Assessment: Other exceptions  Communication   Communication: No difficulties  Cognition Arousal/Alertness: Awake/alert Behavior During Therapy: WFL for tasks assessed/performed Overall Cognitive Status: Within Functional Limits for tasks assessed                      General Comments General comments (skin integrity, edema, etc.): Basic Vestibular assessment preformed.  All test WNL. Stroke  education completed re: signs and symptoms and importance of coming to ED quickly.           Assessment/Plan    PT Assessment Patent does not need any further PT services  PT Diagnosis Difficulty walking;Abnormality of gait;Other (comment) (dizziness)         PT Goals (Current goals can be found in the Care Plan section) Acute Rehab PT Goals PT Goal Formulation: All assessment and education complete, DC therapy      Functional Assessment Tool Used: assist  level Functional Limitation: Mobility: Walking and moving around Mobility: Walking and Moving Around Current Status [phone]): 0 percent impaired, limited or restricted Mobility: Walking and Moving Around Goal Status [phone]): 0 percent impaired, limited or restricted Mobility: Walking and Moving Around Discharge Status [phone]): 0 percent impaired, limited or restricted    Time: 3810-1751 PT Time Calculation (min) (ACUTE ONLY): 19 min   Charges:   PT Evaluation $Initial PT Evaluation Tier I: 1 Procedure     PT G Codes:   PT G-Codes **NOT FOR INPATIENT CLASS** Functional Assessment Tool Used: assist level Functional Limitation: Mobility: Walking and moving around Mobility: Walking and Moving Around Current Status (W2585): 0 percent impaired, limited or restricted Mobility: Walking and Moving Around Goal Status (I7782): 0 percent impaired, limited or restricted Mobility: Walking and Moving Around Discharge Status (U2353): 0 percent impaired, limited or restricted    Rutilio Yellowhair B. Purdin, [place], DPT [phone]   07/02/2014, 5:12 PM

## 2014-07-02 NOTE — Care Management Note (Signed)
    Page 1 of 1   07/02/2014     11:17:10 AM CARE MANAGEMENT NOTE 07/02/2014  Patient:  Halvorson,Xaine E   Account Number:  [id number]  Date Initiated:  07/02/2014  Documentation initiated by:  ,  Subjective/Objective Assessment:   Patient was admitted for TIA. Lives at home with spouse.     Action/Plan:   Will follow for discharge needs pending PT/OT evals and physician orders.   Anticipated DC Date:  07/02/2014   Anticipated DC Plan:  HOME/SELF CARE         Choice offered to / List presented to:             Status of service:  Completed, signed off Medicare Important Message given?   (If response is "NO", the following Medicare IM given date fields will be blank) Date Medicare IM given:   Medicare IM given by:   Date Additional Medicare IM given:   Additional Medicare IM given by:    Discharge Disposition:    Per UR Regulation:  Reviewed for med. necessity/level of care/duration of stay  If discussed at Long Length of Stay Meetings, dates discussed:    Comments:  07/02/14 1100   RN, MSN, CM- Met with patient, who was listed as self pay.  Patient has insurance. CM copied the insurance card and promptly returned it to patient's wife.  Photocopy was then given to ED admitting and was entered into the systme.  Patient denies any additional needs.   

## 2014-07-02 NOTE — Progress Notes (Signed)
Discharge orders received. Pt for discharge home today. IV d/c'd. Pt given discharge instructions and prescriptions with verbalized understanding. Family in room to assist with discharge. Staff brought pt downstairs via wheelchair.   

## 2014-07-02 NOTE — Progress Notes (Signed)
Patient ID: Johnny [name]  male  EHM:[id number]    DOB: February 28, 1951    DOA: 07/01/2014  PCP: [name], MD  Brief history of present illness  Patient is a 64 year old male with prior history of TIA, hypertension, hyperlipidemia presented with blurred vision. Patient reported that he was driving on the road when he had sudden blurring of his vision, felt everything around him was spinning. He developed headache although he has prior history of migraines. He had a TIA in 2008. He was at that time placed on aspirin 325 mg daily. Patient had no loss of consciousness. Patient was admitted for TIA workup.  Assessment/Plan: Principal Problem:   TIA (transient ischemic attack) symptoms have significantly resolved - MRI of the brain showed no acute intracranial process, no acute ischemia -2-D echo, carotid Dopplers still pending -  Lipid panel showed LDL 75, continue Crestor  - Hemoglobin A1c pending  - Discussed in detail with neurology, Dr. Janann Colonel given patient has prior history of TIA and was on aspirin 325 mg daily PTA recommended upgrade to Plavix 75 mg daily and outpatient follow-up with neurology.   - PTOT evaluation pending   Active Problems:   HYPERCHOLESTEROLEMIA - LDL 75, continue Crestor     Essential hypertension BP stable   DVT Prophylaxis: heparin subcutaneous   Code Status: observation   Family Communication: discussed in detail with patient's wife at the bedside   Disposition: pending 2-D echo, carotid Dopplers and PT eval   Consultants:  neuro  Procedures: MRI brain  Antibiotics  NoNE    Subjective: Patient seen and examined, symptoms have improved, denies any specific complaints headache has resolved  Objective : Weight change:   Intake/Output Summary (Last 24 hours) at 07/02/14 1539 Last data filed at 07/02/14 0500  Gross per 24 hour  Intake      0 ml  Output    175 ml  Net   -175 ml   Blood pressure 123/78, pulse 71, temperature 98.1 F  (36.7 C), temperature source Oral, resp. rate 20, height 6' (1.829 m), weight 107.049 kg (236 lb), SpO2 99 %.  Physical Exam: General: Alert and awake, oriented x3, not in any acute distress. CVS: S1-S2 clear, no murmur rubs or gallops Chest: clear to auscultation bilaterally, no wheezing, rales or rhonchi Abdomen: soft nontender, nondistended, normal bowel sounds  Extremities: no cyanosis, clubbing or edema noted bilaterally Neuro: Cranial nerves II-XII intact, no focal neurological deficits  Lab Results: Basic Metabolic Panel:  Recent Labs Lab 07/01/14 1659  NA 136  K 4.1  CL 102  CO2 27  GLUCOSE 127*  BUN 13  CREATININE 1.17  CALCIUM 10.0   Liver Function Tests:  Recent Labs Lab 07/01/14 1659  AST 29  ALT 33  ALKPHOS 55  BILITOT 0.8  PROT 6.6  ALBUMIN 4.2   No results for input(s): LIPASE, AMYLASE in the last 168 hours. No results for input(s): AMMONIA in the last 168 hours. CBC:  Recent Labs Lab 07/01/14 1659  WBC 7.0  NEUTROABS 4.0  HGB 15.1  HCT 43.9  MCV 93.6  PLT 235   Cardiac Enzymes: No results for input(s): CKTOTAL, CKMB, CKMBINDEX, TROPONINI in the last 168 hours. BNP: Invalid input(s): POCBNP CBG:  Recent Labs Lab 07/01/14 1924 07/02/14 0612 07/02/14 1211  GLUCAP 88 112* 81     Micro Results: No results found for this or any previous visit (from the past 240 hour(s)).  Studies/Results: Ct Head (brain) Wo Contrast  07/01/2014  CLINICAL DATA:  Sudden onset of vision changes lasting approximately 15 min. This happened while driving at 11 a.m. Symptoms gradually went away. Also generalized weakness and slow thought process. Headache.  EXAM: CT HEAD WITHOUT CONTRAST  TECHNIQUE: Contiguous axial images were obtained from the base of the skull through the vertex without intravenous contrast.  COMPARISON:  CT head 04/30/2007.  MRI brain 05/01/2007.  FINDINGS: Mild diffuse cerebral atrophy. Patchy low-attenuation changes in the deep white  matter consistent with small vessel ischemia. No mass effect or midline shift. No abnormal extra-axial fluid collections. Gray-white matter junctions are distinct. Basal cisterns are not effaced. No evidence of acute intracranial hemorrhage. No depressed skull fractures. Visualized paranasal sinuses and mastoid air cells are not opacified.  IMPRESSION: No acute intracranial abnormalities. Mild diffuse atrophy and small vessel ischemic changes.   Electronically Signed   By: Lucienne Capers M.D.   On: 07/01/2014 17:48   Mr Brain Wo Contrast  07/01/2014   CLINICAL DATA:  Acute onset vision changes for 15 minutes while driving this morning. Subsequent weakness and mental slowing, headache. Assess for stroke versus transient ischemic attack. History of hypertension and hyperlipidemia.  EXAM: MRI HEAD WITHOUT CONTRAST  TECHNIQUE: Multiplanar, multiecho pulse sequences of the brain and surrounding structures were obtained without intravenous contrast.  COMPARISON:  CT of the head April 01, 2015 at 1741 hours an MRI of the brain May 01, 2007  FINDINGS: No reduced diffusion to suggest acute ischemia. No susceptibility artifact to suggest hemorrhage.  Ventricles and sulci are normal for patient's age. Patchy supratentorial white matter FLAIR T2 hyperintensities noted. 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 No midline shift, mass effect or mass lesions.  No abnormal extra-axial fluid collections. Normal major intracranial vascular flow voids noted at the skull base.  Ocular globes and orbital contents are unremarkable though not tailored for evaluation. No paranasal sinus air-fluid levels. The mastoid air cells are well aerated. No abnormal sellar expansion. No cerebellar tonsillar ectopia. No suspicious calvarial bone marrow signal.  IMPRESSION: No acute intracranial process, specifically no acute ischemia.  Mild to moderate white matter changes can be seen with chronic small vessel ischemic  disease.   Electronically Signed   By: Elon Alas   On: 07/01/2014 23:22    Medications: Scheduled Meds: . citalopram  20 mg Oral Daily  . clopidogrel  75 mg Oral Daily  . cyclobenzaprine  10 mg Oral 3 times per day  . gabapentin  300 mg Oral TID  . heparin  5,000 Units Subcutaneous 3 times per day  . losartan  100 mg Oral Daily   And  . hydrochlorothiazide  12.5 mg Oral Daily  . lisdexamfetamine  60 mg Oral Daily  . pantoprazole  40 mg Oral Daily  . rosuvastatin  10 mg Oral Daily    time spent 25 minutes   Quintarius Ferns M.D. [facility] 07/02/2014, 3:39 PM Pager: [phone]  If 7PM-7AM, please contact night-coverage [email or web address] Password [email or web address]

## 2014-07-02 NOTE — Progress Notes (Signed)
I agree with chart documentation from [facility] from [place] to [place].

## 2014-07-02 NOTE — Progress Notes (Signed)
2D Echocardiogram Complete.  07/02/2014   Keifer Habib, RDCS  

## 2014-07-02 NOTE — Progress Notes (Signed)
VASCULAR LAB PRELIMINARY  PRELIMINARY  PRELIMINARY  PRELIMINARY  Carotid duplex completed.    Preliminary report:  Bilateral:  1-39% ICA stenosis.  Vertebral artery flow is antegrade.     Johnny [name], RVS 07/02/2014, 3:48 PM

## 2014-07-02 NOTE — Discharge Summary (Signed)
Physician Discharge Summary  Patient ID: GLADSTONE ROSAS MRN: [id number] DOB/AGE: [birth year] 64 y.o.  Admit date: 07/01/2014 Discharge date: 07/02/2014  Primary Care Physician:  [name], MD  Discharge Diagnoses:    . TIA (transient ischemic attack) . Essential hypertension . HYPERCHOLESTEROLEMIA  Consults: Neurology, Dr. Janann Colonel    Recommendations for Outpatient Follow-up:  I Made him an appointment with neurology outpatient, on 311/16 with Dr. Jaynee Eagles given history of recurrent TIAs  New medication Plavix 75 mg daily  Please follow hemoglobin A1c  DIET heart healthy diet    Allergies:   Allergies  Allergen Reactions  . Hydrocodone Nausea Only     Discharge Medications:   Medication List    STOP taking these medications        aspirin 325 MG tablet      TAKE these medications        buPROPion 300 MG 24 hr tablet  Commonly known as:  WELLBUTRIN XL     CIALIS 5 MG tablet  Generic drug:  tadalafil     citalopram 20 MG tablet  Commonly known as:  CELEXA  TAKE 1 TABLET BY MOUTH EVERY DAY     clopidogrel 75 MG tablet  Commonly known as:  PLAVIX  Take 1 tablet (75 mg total) by mouth daily.     cyclobenzaprine 10 MG tablet  Commonly known as:  FLEXERIL  Take 10 mg by mouth every 8 (eight) hours.     gabapentin 300 MG capsule  Commonly known as:  NEURONTIN  Take 300 mg by mouth 3 (three) times daily.     losartan-hydrochlorothiazide 100-12.5 MG per tablet  Commonly known as:  HYZAAR  Take 1 tablet by mouth daily.     omeprazole 20 MG capsule  Commonly known as:  PRILOSEC  Take 20 mg by mouth daily.     oxyCODONE-acetaminophen 10-325 MG per tablet  Commonly known as:  PERCOCET  Take 1 tablet by mouth every 4 (four) hours as needed.     rosuvastatin 10 MG tablet  Commonly known as:  CRESTOR  Take 1 tablet (10 mg total) by mouth daily.     testosterone 50 MG/5GM (1%) Gel  Commonly known as:  ANDROGEL  Place 5 g onto the skin daily.      VYVANSE 40 MG capsule  Generic drug:  lisdexamfetamine     VYVANSE 60 MG capsule  Generic drug:  lisdexamfetamine  Take 60 mg by mouth daily.         Brief H and P: For complete details please refer to admission H and P, but in brief Patient is a 64 year old male with prior history of TIA, hypertension, hyperlipidemia presented with blurred vision. Patient reported that he was driving on the road when he had sudden blurring of his vision, felt everything around him was spinning. He developed headache although he has prior history of migraines. He had a TIA in 2008. He was at that time placed on aspirin 325 mg daily. Patient had no loss of consciousness. Patient was admitted for TIA workup.   Hospital Course:   TIA (transient ischemic attack), transient blurry vision, symptoms have resolved - MRI of the brain showed no acute intracranial process, no acute ischemia - 2-D echo showed EF of 55-60% and normal wall motion, no regional wall motion abnormalities. -- carotid Dopplers showed 1-39% ICA stenosis bilaterally - Lipid panel showed LDL 75, continue Crestor  - Hemoglobin A1c in process, please follow up.  I discussed in  detail with neurology, Dr. Janann Colonel given patient has prior history of TIA and was on aspirin 325 mg daily PTA recommended upgrade to Plavix 75 mg daily and outpatient follow-up with neurology which is arranged.  - Patient was evaluated by PT prior to discharge, back to his baseline   HYPERCHOLESTEROLEMIA - LDL 75, continue Crestor    Essential hypertension BP stable   Day of Discharge BP 133/84 mmHg  Pulse 64  Temp(Src) 98 F (36.7 C) (Oral)  Resp 18  Ht 6' (1.829 m)  Wt 107.049 kg (236 lb)  BMI 32.00 kg/m2  SpO2 99%  Physical Exam: General: Alert and awake oriented x3 not in any acute distress. HEENT: anicteric sclera, pupils reactive to light and accommodation CVS: S1-S2 clear no murmur rubs or gallops Chest: clear to auscultation bilaterally,  no wheezing rales or rhonchi Abdomen: soft nontender, nondistended, normal bowel sounds Extremities: no cyanosis, clubbing or edema noted bilaterally Neuro: Cranial nerves II-XII intact, no focal neurological deficits   The results of significant diagnostics from this hospitalization (including imaging, microbiology, ancillary and laboratory) are listed below for reference.    LAB RESULTS: Basic Metabolic Panel:  Recent Labs Lab 07/01/14 1659  NA 136  K 4.1  CL 102  CO2 27  GLUCOSE 127*  BUN 13  CREATININE 1.17  CALCIUM 10.0   Liver Function Tests:  Recent Labs Lab 07/01/14 1659  AST 29  ALT 33  ALKPHOS 55  BILITOT 0.8  PROT 6.6  ALBUMIN 4.2   No results for input(s): LIPASE, AMYLASE in the last 168 hours. No results for input(s): AMMONIA in the last 168 hours. CBC:  Recent Labs Lab 07/01/14 1659  WBC 7.0  NEUTROABS 4.0  HGB 15.1  HCT 43.9  MCV 93.6  PLT 235   Cardiac Enzymes: No results for input(s): CKTOTAL, CKMB, CKMBINDEX, TROPONINI in the last 168 hours. BNP: Invalid input(s): POCBNP CBG:  Recent Labs Lab 07/02/14 0612 07/02/14 1211  GLUCAP 112* 81    Significant Diagnostic Studies:  Ct Head (brain) Wo Contrast  07/01/2014   CLINICAL DATA:  Sudden onset of vision changes lasting approximately 15 min. This happened while driving at 11 a.m. Symptoms gradually went away. Also generalized weakness and slow thought process. Headache.  EXAM: CT HEAD WITHOUT CONTRAST  TECHNIQUE: Contiguous axial images were obtained from the base of the skull through the vertex without intravenous contrast.  COMPARISON:  CT head 04/30/2007.  MRI brain 05/01/2007.  FINDINGS: Mild diffuse cerebral atrophy. Patchy low-attenuation changes in the deep white matter consistent with small vessel ischemia. No mass effect or midline shift. No abnormal extra-axial fluid collections. Gray-white matter junctions are distinct. Basal cisterns are not effaced. No evidence of acute  intracranial hemorrhage. No depressed skull fractures. Visualized paranasal sinuses and mastoid air cells are not opacified.  IMPRESSION: No acute intracranial abnormalities. Mild diffuse atrophy and small vessel ischemic changes.   Electronically Signed   By: Lucienne Capers M.D.   On: 07/01/2014 17:48   Mr Brain Wo Contrast  07/01/2014   CLINICAL DATA:  Acute onset vision changes for 15 minutes while driving this morning. Subsequent weakness and mental slowing, headache. Assess for stroke versus transient ischemic attack. History of hypertension and hyperlipidemia.  EXAM: MRI HEAD WITHOUT CONTRAST  TECHNIQUE: Multiplanar, multiecho pulse sequences of the brain and surrounding structures were obtained without intravenous contrast.  COMPARISON:  CT of the head April 01, 2015 at 1741 hours an MRI of the brain May 01, 2007  FINDINGS:  No reduced diffusion to suggest acute ischemia. No susceptibility artifact to suggest hemorrhage.  Ventricles and sulci are normal for patient's age. Patchy supratentorial white matter FLAIR T2 hyperintensities noted. 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 No midline shift, mass effect or mass lesions.  No abnormal extra-axial fluid collections. Normal major intracranial vascular flow voids noted at the skull base.  Ocular globes and orbital contents are unremarkable though not tailored for evaluation. No paranasal sinus air-fluid levels. The mastoid air cells are well aerated. No abnormal sellar expansion. No cerebellar tonsillar ectopia. No suspicious calvarial bone marrow signal.  IMPRESSION: No acute intracranial process, specifically no acute ischemia.  Mild to moderate white matter changes can be seen with chronic small vessel ischemic disease.   Electronically Signed   By: Elon Alas   On: 07/01/2014 23:22    2D ECHO: Study Conclusions  - Left ventricle: The cavity size was normal. There was mild concentric hypertrophy. Systolic  function was normal. The estimated ejection fraction was in the range of 55% to 60%. Wall motion was normal; there were no regional wall motion abnormalities. - Pericardium, extracardiac: A trivial pericardial effusion was identified posterior to the heart.  Disposition and Follow-up: Discharge Instructions    Diet Carb Modified    Complete by:  As directed      Discharge instructions    Complete by:  As directed   Please continue Plavix daily, no aspirin. Follow up with neurology as scheduled.     Increase activity slowly    Complete by:  As directed             DISPOSITION: Home   DISCHARGE FOLLOW-UP Follow-up Information    Follow up with [name], MD. Schedule an appointment as soon as possible for a visit in 10 days.   Specialty:  Internal Medicine   Why:  for hospital follow-up   Contact information:   [address] [place] [id number] [phone]       Follow up with Melvenia Beam, MD On 07/30/2014.   Specialty:  Neurology   Why:  for hospital follow-up at 11:00 AM     Contact information:   [place] [address] [phone]        Time spent on Discharge: 35 minutes  Signed:   Doyel Mulkern M.D. [facility] 07/02/2014, 5:06 PM Pager: [phone]

## 2014-07-03 LAB — HEMOGLOBIN A1C
HEMOGLOBIN A1C: 5.7 % — AB (ref 4.8–5.6)
MEAN PLASMA GLUCOSE: 117 mg/dL

## 2014-07-30 ENCOUNTER — Institutional Professional Consult (permissible substitution): Payer: 59 | Admitting: Neurology

## 2014-09-14 ENCOUNTER — Ambulatory Visit (INDEPENDENT_AMBULATORY_CARE_PROVIDER_SITE_OTHER): Payer: 59 | Admitting: Cardiovascular Disease

## 2014-09-14 ENCOUNTER — Encounter: Payer: Self-pay | Admitting: Cardiovascular Disease

## 2014-09-14 VITALS — BP 128/80 | HR 77 | Ht 72.0 in | Wt 242.0 lb

## 2014-09-14 DIAGNOSIS — G459 Transient cerebral ischemic attack, unspecified: Secondary | ICD-10-CM | POA: Diagnosis not present

## 2014-09-14 NOTE — Progress Notes (Signed)
Patient ID: Johnny [name], male   DOB: [birth year], 64 y.o.   MRN: [id number] Johnny [name] seen today in followup for hypertension and hypercholesterolemia. Previous question of a TIA. I did a transesophageal echocardiogram on him in March of 2009 there was no PFO. He septally saw Dr. 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 The patient followed up with Dr. Carloyn Manner and had his lower back surgery. Seems to have helped him. He has been compliant with his blood pressure and cholesterol pills. He has not had any significant chest pain PND or orthopnea. There is no documented coronary artery disease. His weight has been stable. His primary care physician is Dr. Renold Genta. She increased his crestor and indicates that cholesterol is good   06/2014 admitted with ? TIA visual disturbance.   MRI normal  Echo reviewed and normal EF 55-60% trivial pericardial effusion Started on Plavix   No new issues since d/c  ROS: Denies fever, malais, weight loss, blurry vision, decreased visual acuity, cough, sputum, SOB, hemoptysis, pleuritic pain, palpitaitons, heartburn, abdominal pain, melena, lower extremity edema, claudication, or rash.  All other systems reviewed and negative  General: Affect appropriate Healthy:  appears stated age 64: normal Neck supple with no adenopathy JVP normal no bruits no thyromegaly Lungs clear with no wheezing and good diaphragmatic motion Heart:  S1/S2 no murmur, no rub, gallop or click PMI normal Abdomen: benighn, BS positve, no tenderness, no AAA no bruit.  No HSM or HJR Distal pulses intact with no bruits No edema Neuro non-focal Skin warm and dry No muscular weakness   Current Outpatient Prescriptions  Medication Sig Dispense Refill  . aspirin 325 MG tablet Take 325 mg by mouth daily.    Marland Kitchen buPROPion (WELLBUTRIN XL) 300 MG 24 hr tablet Take 300 mg by mouth daily.     Marland Kitchen CIALIS 5 MG tablet Take 5 mg by mouth daily as needed for erectile dysfunction.     .  citalopram (CELEXA) 20 MG tablet TAKE 1 TABLET BY MOUTH EVERY DAY 90 tablet 2  . gabapentin (NEURONTIN) 300 MG capsule Take 300 mg by mouth daily as needed ([place]).   2  . losartan-hydrochlorothiazide (HYZAAR) 100-12.5 MG per tablet Take 1 tablet by mouth daily. 30 tablet 11  . omeprazole (PRILOSEC) 20 MG capsule Take 20 mg by mouth daily.      Marland Kitchen oxyCODONE-acetaminophen (PERCOCET) 10-325 MG per tablet Take 1 tablet by mouth every 4 (four) hours as needed.      . rosuvastatin (CRESTOR) 10 MG tablet Take 1 tablet (10 mg total) by mouth daily. (Patient taking differently: Take 20 mg by mouth daily. ) 30 tablet 11  . testosterone (ANDROGEL) 50 MG/5GM (1%) GEL Place 5 g onto the skin daily.    Marland Kitchen VYVANSE 60 MG capsule Take 60 mg by mouth daily.  0  . clopidogrel (PLAVIX) 75 MG tablet Take 1 tablet (75 mg total) by mouth daily. (Patient not taking: Reported on 09/14/2014) 30 tablet 11   No current facility-administered medications for this visit.    Allergies  Hydrocodone  Electrocardiogram:   09/10/13  SR rate 90 normal  07/02/14  SR Q in lead 3 otherwise normal   Assessment and Plan HTN:  Well controlled.  Continue current medications and low sodium Dash type diet.   Chol:   Cholesterol is at goal.  Continue current dose of statin and diet Rx.  No myalgias or side effects.  F/U  LFT's in  6 months. Lab Results  Component Value Date   LDLCALC 75 07/02/2014  Visual Change:  Sounds more like occular migraine f/u neuro not clear to me that he needs plavix.  MRI was negative Telemetry normal and echo normal doubt embolic event

## 2014-09-14 NOTE — Patient Instructions (Signed)
Medication Instructions:  NO CHANGES  Labwork: NONE  Testing/Procedures: NONE  Follow-Up: Your physician wants you to follow-up in: 6 MONTHS WITH DR NISHAN You will receive a reminder letter in the mail two months in advance. If you don't receive a letter, please call our office to schedule the follow-up appointment.  Any Other Special Instructions Will Be Listed Below (If Applicable).   

## 2014-09-18 ENCOUNTER — Other Ambulatory Visit: Payer: Self-pay | Admitting: Cardiovascular Disease

## 2014-10-21 ENCOUNTER — Ambulatory Visit (INDEPENDENT_AMBULATORY_CARE_PROVIDER_SITE_OTHER): Payer: 59 | Admitting: Family Medicine

## 2014-10-21 VITALS — BP 110/60 | HR 78 | Temp 98.3°F | Resp 20 | Ht 72.0 in | Wt 242.4 lb

## 2014-10-21 DIAGNOSIS — J209 Acute bronchitis, unspecified: Secondary | ICD-10-CM | POA: Diagnosis not present

## 2014-10-21 MED ORDER — AZITHROMYCIN 250 MG PO TABS: ORAL_TABLET | ORAL | Status: DC

## 2014-10-21 MED ORDER — ALBUTEROL SULFATE 108 (90 BASE) MCG/ACT IN AEPB: 2.0000 | INHALATION_SPRAY | RESPIRATORY_TRACT | Status: DC | PRN

## 2014-10-21 MED ORDER — HYDROCOD POLST-CPM POLST ER 10-8 MG/5ML PO SUER: 5.0000 mL | Freq: Two times a day (BID) | ORAL | Status: DC | PRN

## 2014-10-21 NOTE — Progress Notes (Signed)
Subjective:    Patient ID: Johnny [name], male    DOB: 20-Oct-1950, 64 y.o.   MRN: [id number] Chief Complaint  Patient presents with  . Sore Throat    x 2-3 days.  throat feels very dry  . Cough    that is non productive x 2-3 days    HPI Ill now x 3d, triggered by mowing the day prior but then started by [facility], wheeze, cough - felt like dry phlegm was caught in throat. Now has been able to mobilizie phelgm which has been dark yellow.  Having a hard time breathing esp waking him up in early morning, is wheezing and has caused him to panic a little as he has to fight to breath.  His son does have asthmatic bronchitis but pt has never had to have an inhaler before.  No f/c, no sig rhinorrea or sinus congestion.  Not using any otc meds other than asa.  Son has same problem/same symptoms and was seen - put on antibiotics  Father had emphysema but pt is non-smoking   Past Medical History  Diagnosis Date  . Hyperlipidemia   . Hypertension   . GERD (gastroesophageal reflux disease)   . Osteopenia   . Internal hemorrhoids   . Adenomatous polyps   . Low testosterone   . Vitamin D deficiency   . Chronic back pain    Current Outpatient Prescriptions on File Prior to Visit  Medication Sig Dispense Refill  . aspirin 325 MG tablet Take 325 mg by mouth daily.    Marland Kitchen CIALIS 5 MG tablet Take 5 mg by mouth daily as needed for erectile dysfunction.     . citalopram (CELEXA) 20 MG tablet TAKE 1 TABLET BY MOUTH EVERY DAY (Patient taking differently: TAKE 1/2 TABLET BY MOUTH EVERY DAY) 90 tablet 2  . gabapentin (NEURONTIN) 300 MG capsule Take 300 mg by mouth daily as needed ([place]).   2  . losartan-hydrochlorothiazide (HYZAAR) 100-12.5 MG per tablet TAKE 1 TABLET BY MOUTH DAILY. 30 tablet 5  . omeprazole (PRILOSEC) 20 MG capsule Take 20 mg by mouth daily.      Marland Kitchen oxyCODONE-acetaminophen (PERCOCET) 10-325 MG per tablet Take 1 tablet by mouth every 4 (four) hours as needed for pain.     .  rosuvastatin (CRESTOR) 10 MG tablet Take 1 tablet (10 mg total) by mouth daily. (Patient taking differently: Take 20 mg by mouth daily. ) 30 tablet 11  . VYVANSE 60 MG capsule Take 60 mg by mouth daily.  0  . clopidogrel (PLAVIX) 75 MG tablet Take 1 tablet (75 mg total) by mouth daily. (Patient not taking: Reported on 10/21/2014) 30 tablet 11   No current facility-administered medications on file prior to visit.   Allergies  Allergen Reactions  . Morphine And Related Nausea And Vomiting  . Hydrocodone Nausea Only    Review of Systems  Constitutional: Positive for activity change, appetite change and fatigue. Negative for fever, chills, diaphoresis and unexpected weight change.  HENT: Positive for congestion and postnasal drip. Negative for ear pain, mouth sores, rhinorrhea, sinus pressure and sore throat.   Respiratory: Positive for cough, chest tightness, shortness of breath and wheezing.   Cardiovascular: Negative for chest pain.  Gastrointestinal: Negative for nausea, vomiting, abdominal pain, diarrhea and constipation.  Genitourinary: Negative for dysuria.  Musculoskeletal: Negative for myalgias, arthralgias, neck pain and neck stiffness.  Skin: Negative for rash.  Neurological: Positive for headaches. Negative for syncope.  Hematological: Negative  for adenopathy.  Psychiatric/Behavioral: Positive for sleep disturbance. The patient is nervous/anxious.        Objective:  BP 110/60 mmHg  Pulse 78  Temp(Src) 98.3 F (36.8 C) (Oral)  Resp 20  Ht 6' (1.829 m)  Wt 242 lb 6 oz (109.941 kg)  BMI 32.86 kg/m2  SpO2 96%  Physical Exam  Constitutional: He is oriented to person, place, and time. He appears well-developed and well-nourished. No distress.  HENT:  Head: Normocephalic and atraumatic.  Right Ear: Tympanic membrane, external ear and ear canal normal.  Left Ear: Tympanic membrane, external ear and ear canal normal.  Nose: Nose normal.  Mouth/Throat: Oropharynx is clear and  moist and mucous membranes are normal. No oropharyngeal exudate.  Eyes: Conjunctivae are normal. No scleral icterus.  Neck: Normal range of motion. Neck supple. No thyromegaly present.  Cardiovascular: Normal rate, regular rhythm, normal heart sounds and intact distal pulses.   Pulmonary/Chest: Effort normal and breath sounds normal. No respiratory distress.  Musculoskeletal: He exhibits no edema.  Lymphadenopathy:    He has no cervical adenopathy.  Neurological: He is alert and oriented to person, place, and time.  Skin: Skin is warm and dry. He is not diaphoretic. No erythema.  Psychiatric: He has a normal mood and affect. His behavior is normal.          Assessment & Plan:   1. Acute bronchitis, unspecified organism     Meds ordered this encounter  Medications  . azithromycin (ZITHROMAX) 250 MG tablet    Sig: Take 2 tabs PO x 1 dose, then 1 tab PO QD x 4 days    Dispense:  6 tablet    Refill:  0  . Albuterol Sulfate (PROAIR RESPICLICK) 850 (90 BASE) MCG/ACT AEPB    Sig: Inhale 2 puffs into the lungs every 4 (four) hours as needed.    Dispense:  1 each    Refill:  0  . chlorpheniramine-HYDROcodone (TUSSIONEX PENNKINETIC ER) 10-8 MG/5ML SUER    Sig: Take 5 mLs by mouth every 12 (twelve) hours as needed for cough.    Dispense:  120 mL    Refill:  0    Delman Cheadle, MD MPH

## 2014-10-21 NOTE — Patient Instructions (Signed)

## 2014-10-26 ENCOUNTER — Encounter (HOSPITAL_COMMUNITY): Payer: Self-pay | Admitting: Emergency Medicine

## 2014-10-26 ENCOUNTER — Emergency Department (HOSPITAL_COMMUNITY)
Admission: EM | Admit: 2014-10-26 | Discharge: 2014-10-26 | Disposition: A | Discharge status: Home / Self Care | Payer: 59 | Attending: Emergency Medicine | Admitting: Emergency Medicine

## 2014-10-26 ENCOUNTER — Emergency Department (HOSPITAL_COMMUNITY): Payer: 59

## 2014-10-26 DIAGNOSIS — E785 Hyperlipidemia, unspecified: Secondary | ICD-10-CM | POA: Diagnosis not present

## 2014-10-26 DIAGNOSIS — G8929 Other chronic pain: Secondary | ICD-10-CM | POA: Insufficient documentation

## 2014-10-26 DIAGNOSIS — Z87891 Personal history of nicotine dependence: Secondary | ICD-10-CM | POA: Diagnosis not present

## 2014-10-26 DIAGNOSIS — Z7982 Long term (current) use of aspirin: Secondary | ICD-10-CM | POA: Insufficient documentation

## 2014-10-26 DIAGNOSIS — N23 Unspecified renal colic: Secondary | ICD-10-CM | POA: Insufficient documentation

## 2014-10-26 DIAGNOSIS — Z8739 Personal history of other diseases of the musculoskeletal system and connective tissue: Secondary | ICD-10-CM | POA: Insufficient documentation

## 2014-10-26 DIAGNOSIS — Z79899 Other long term (current) drug therapy: Secondary | ICD-10-CM | POA: Diagnosis not present

## 2014-10-26 DIAGNOSIS — Z86018 Personal history of other benign neoplasm: Secondary | ICD-10-CM | POA: Insufficient documentation

## 2014-10-26 DIAGNOSIS — K219 Gastro-esophageal reflux disease without esophagitis: Secondary | ICD-10-CM | POA: Insufficient documentation

## 2014-10-26 DIAGNOSIS — I10 Essential (primary) hypertension: Secondary | ICD-10-CM | POA: Diagnosis not present

## 2014-10-26 DIAGNOSIS — R109 Unspecified abdominal pain: Secondary | ICD-10-CM | POA: Diagnosis present

## 2014-10-26 DIAGNOSIS — Z87438 Personal history of other diseases of male genital organs: Secondary | ICD-10-CM | POA: Insufficient documentation

## 2014-10-26 HISTORY — DX: Dorsalgia, unspecified: M54.9

## 2014-10-26 HISTORY — DX: Other chronic pain: G89.29

## 2014-10-26 LAB — URINE MICROSCOPIC-ADD ON

## 2014-10-26 LAB — URINALYSIS, ROUTINE W REFLEX MICROSCOPIC
BILIRUBIN URINE: NEGATIVE
Glucose, UA: NEGATIVE mg/dL
KETONES UR: NEGATIVE mg/dL
LEUKOCYTES UA: NEGATIVE
NITRITE: NEGATIVE
Protein, ur: NEGATIVE mg/dL
SPECIFIC GRAVITY, URINE: 1.016 (ref 1.005–1.030)
UROBILINOGEN UA: 0.2 mg/dL (ref 0.0–1.0)
pH: 5.5 (ref 5.0–8.0)

## 2014-10-26 MED ORDER — ONDANSETRON 8 MG PO TBDP: 8.0000 mg | ORAL_TABLET | Freq: Three times a day (TID) | ORAL | Status: DC | PRN

## 2014-10-26 MED ORDER — TAMSULOSIN HCL 0.4 MG PO CAPS
0.4000 mg | ORAL_CAPSULE | Freq: Once | ORAL | Status: AC
  Administered 2014-10-26: 0.4 mg via ORAL
  Filled 2014-10-26: qty 1

## 2014-10-26 MED ORDER — FENTANYL CITRATE (PF) 100 MCG/2ML IJ SOLN
100.0000 ug | Freq: Once | INTRAMUSCULAR | Status: AC
  Administered 2014-10-26: 100 ug via INTRAVENOUS
  Filled 2014-10-26: qty 2

## 2014-10-26 MED ORDER — TAMSULOSIN HCL 0.4 MG PO CAPS: ORAL_CAPSULE | ORAL | Status: DC

## 2014-10-26 MED ORDER — KETOROLAC TROMETHAMINE 15 MG/ML IJ SOLN
15.0000 mg | Freq: Once | INTRAMUSCULAR | Status: AC
  Administered 2014-10-26: 15 mg via INTRAVENOUS
  Filled 2014-10-26: qty 1

## 2014-10-26 MED ORDER — SODIUM CHLORIDE 0.9 % IV SOLN
INTRAVENOUS | Status: DC
  Administered 2014-10-26: 02:00:00 via INTRAVENOUS

## 2014-10-26 MED ORDER — HYDROMORPHONE HCL 4 MG PO TABS: 4.0000 mg | ORAL_TABLET | ORAL | Status: DC | PRN

## 2014-10-26 MED ORDER — ONDANSETRON HCL 4 MG/2ML IJ SOLN
4.0000 mg | Freq: Once | INTRAMUSCULAR | Status: AC
  Administered 2014-10-26: 4 mg via INTRAVENOUS
  Filled 2014-10-26: qty 2

## 2014-10-26 NOTE — ED Provider Notes (Signed)
CSN: [id number]     Arrival date & time 10/26/14  0129 History   First MD Initiated Contact with Patient 10/26/14 0207     Chief Complaint  Patient presents with  . Flank Pain     (Consider location/radiation/quality/duration/timing/severity/associated sxs/prior Treatment) HPI  This is a 64 year old male who is on oxycodone for chronic back pain. He is here with a stabbing left flank pain that started about an hour ago. He has no history of kidney stones. The pain is described as severe. It is not significantly changed with movement. There is associated nausea but no vomiting. He states he is having difficulty urinating but has not noticed any blood in his urine.  Past Medical History  Diagnosis Date  . Hyperlipidemia   . Hypertension   . GERD (gastroesophageal reflux disease)   . Osteopenia   . Internal hemorrhoids   . Adenomatous polyps   . Low testosterone   . Vitamin D deficiency   . Chronic back pain    Past Surgical History  Procedure Laterality Date  . Back surgery     Family History  Problem Relation Age of Onset  . Heart disease Mother   . Stroke Mother    History  Substance Use Topics  . Smoking status: Former Smoker -- 10 years    Types: Cigarettes    Quit date: 01/04/1977  . Smokeless tobacco: Never Used  . Alcohol Use: 0.0 oz/week    0 Standard drinks or equivalent per week     Comment: rarely    Review of Systems  All other systems reviewed and are negative.   Allergies  Morphine and related and Hydrocodone  Home Medications   Prior to Admission medications   Medication Sig Start Date End Date Taking? Authorizing Provider  Albuterol Sulfate (PROAIR RESPICLICK) 702 (90 BASE) MCG/ACT AEPB Inhale 2 puffs into the lungs every 4 (four) hours as needed. Patient taking differently: Inhale 2 puffs into the lungs every 4 (four) hours as needed (SOB).  10/21/14  Yes Shawnee Knapp, MD  aspirin 325 MG tablet Take 325 mg by mouth daily.   Yes Historical Provider,  MD  CIALIS 5 MG tablet Take 5 mg by mouth daily as needed for erectile dysfunction.  12/27/11  Yes Historical Provider, MD  citalopram (CELEXA) 20 MG tablet TAKE 1 TABLET BY MOUTH EVERY DAY Patient taking differently: TAKE 1/2 TABLET BY MOUTH EVERY DAY 11/26/12  Yes [name], MD  gabapentin (NEURONTIN) 300 MG capsule Take 300 mg by mouth daily as needed (SINCE BACK SURGERY).  05/09/14  Yes Historical Provider, MD  losartan-hydrochlorothiazide (HYZAAR) 100-12.5 MG per tablet TAKE 1 TABLET BY MOUTH DAILY. 09/20/14  Yes [name], MD  omeprazole (PRILOSEC) 20 MG capsule Take 20 mg by mouth daily.     Yes Historical Provider, MD  oxyCODONE-acetaminophen (PERCOCET) 10-325 MG per tablet Take 1 tablet by mouth every 4 (four) hours as needed for pain.    Yes Historical Provider, MD  rosuvastatin (CRESTOR) 10 MG tablet Take 1 tablet (10 mg total) by mouth daily. Patient taking differently: Take 20 mg by mouth daily.  09/10/13  Yes [name], MD  VYVANSE 60 MG capsule Take 60 mg by mouth daily. 06/09/14  Yes Historical Provider, MD  azithromycin (ZITHROMAX) 250 MG tablet Take 2 tabs PO x 1 dose, then 1 tab PO QD x 4 days Patient not taking: Reported on 10/26/2014 10/21/14   Shawnee Knapp, MD  chlorpheniramine-HYDROcodone [facility]  PENNKINETIC ER) 10-8 MG/5ML SUER Take 5 mLs by mouth every 12 (twelve) hours as needed for cough. 10/21/14   Shawnee Knapp, MD  clopidogrel (PLAVIX) 75 MG tablet Take 1 tablet (75 mg total) by mouth daily. Patient not taking: Reported on 10/21/2014 07/02/14   Ripudeep K Rai, MD    BP 116/64 mmHg  Pulse 70  Resp 15  SpO2 93%   Physical Exam  General: Well-developed, well-nourished male in no acute distress; appearance consistent with age of record; appears uncomfortable HENT: normocephalic; atraumatic Eyes: pupils equal, round and reactive to light; extraocular muscles intact Neck: supple Heart: regular rate and rhythm Lungs: clear to auscultation bilaterally Abdomen: soft;  nondistended; nontender; bowel sounds present GU: Mild left CVA tenderness Extremities: No deformity; full range of motion; pulses normal Neurologic: Awake, alert and oriented; motor function intact in all extremities and symmetric; no facial droop Skin: Warm and dry Psychiatric: Flat affect    ED Course  Procedures (including critical care time)   MDM  Nursing notes and vitals signs, including pulse oximetry, reviewed.  Summary of this visit's results, reviewed by myself:  Labs:  Results for orders placed or performed during the hospital encounter of 10/26/14 (from the past 24 hour(s))  Urinalysis, Routine w reflex microscopic (not at [facility])     Status: Abnormal   Collection Time: 10/26/14  3:37 AM  Result Value Ref Range   Color, Urine YELLOW YELLOW   APPearance CLEAR CLEAR   Specific Gravity, Urine 1.016 1.005 - 1.030   pH 5.5 5.0 - 8.0   Glucose, UA NEGATIVE NEGATIVE mg/dL   Hgb urine dipstick MODERATE (A) NEGATIVE   Bilirubin Urine NEGATIVE NEGATIVE   Ketones, ur NEGATIVE NEGATIVE mg/dL   Protein, ur NEGATIVE NEGATIVE mg/dL   Urobilinogen, UA 0.2 0.0 - 1.0 mg/dL   Nitrite NEGATIVE NEGATIVE   Leukocytes, UA NEGATIVE NEGATIVE  Urine microscopic-add on     Status: None   Collection Time: 10/26/14  3:37 AM  Result Value Ref Range   WBC, UA 0-2 <3 WBC/hpf   RBC / HPF 21-50 <3 RBC/hpf   Bacteria, UA RARE RARE    Imaging Studies: Ct Renal Stone Study  10/26/2014   CLINICAL DATA:  Sudden onset of left flank pain 2 hours prior.  EXAM: CT ABDOMEN AND PELVIS WITHOUT CONTRAST  TECHNIQUE: Multidetector CT imaging of the abdomen and pelvis was performed following the standard protocol without IV contrast.  COMPARISON:  None.  FINDINGS: 5 mm nodule in the right middle lobe. Scattered multifocal atelectasis in the lung bases.  There is a 4 mm obstructing stone in the distal left ureter with mild resultant hydroureteronephrosis. There are multiple bilateral nonobstructing renal  calculi. No obstructive uropathy on the right. There is a 2 cm partially exophytic cyst probably interpolar left kidney. Mild nonspecific perinephric stranding about both kidneys.  Evaluation of the remaining solid and hollow viscera is limited given lack of contrast. The unenhanced liver, gallbladder, spleen, adrenal glands, and pancreas are normal.  The stomach is decompressed. There are no dilated or thickened bowel loops. The appendix is normal. Small volume of colonic stool without colonic wall thickening. Scattered diverticula in the distal colon without diverticulitis. No free air, free fluid, or intra-abdominal fluid collection.  No retroperitoneal adenopathy. Abdominal aorta is normal in caliber. Incidental note of circum aortic left renal vein. Tiny fat containing umbilical hernia  Within the pelvis the bladder is physiologically distended. There is fat within both inguinal canals. Prostate gland  is normal in size. No free fluid.  There are no acute or suspicious osseous abnormalities. There are scattered bone islands. Postsurgical change at L4-L5. Vacuum phenomenon at L5-S1.  IMPRESSION: 1. Obstructing 4 mm stone in the distal left ureter with mild resultant hydroureteronephrosis. 2. Multiple bilateral nonobstructing renal calculi. 3. Diverticulosis without diverticulitis. 4. Right middle lobe pulmonary nodule measures 5 mm. If the patient is at high risk for bronchogenic carcinoma, follow-up chest CT at 6-12 months is recommended. If the patient is at low risk for bronchogenic carcinoma, follow-up chest CT at 12 months is recommended. This recommendation follows the consensus statement: Guidelines for Management of Small Pulmonary Nodules Detected on CT Scans: A Statement from the [place] as published in Radiology 2005;237:395-400.   Electronically Signed   By: Jeb Levering M.D.   On: 10/26/2014 02:51   4:22 AM Pain well controlled with IV medications. Patient advised of CT findings  possibly warranting follow-up study to violate lung nodule.   Shanon Rosser, MD 10/26/14 [phone]

## 2014-10-26 NOTE — ED Notes (Signed)
Pt is unable to give a urine sample at this time. 

## 2014-10-26 NOTE — ED Notes (Signed)
Provided urinal for urine sample. °

## 2014-10-26 NOTE — ED Notes (Signed)
Patient given urine strainer for home. Instructed to void through strainer until stone passes

## 2014-10-26 NOTE — ED Notes (Signed)
Pt states he woke up about an hour ago with the feeling that someone had stabbed him in the left flank area  Pt states the pain has not let up since  Pt states the pain radiates toward his groin area  Pt states he has nausea but has not vomited yet

## 2014-12-02 ENCOUNTER — Telehealth: Payer: Self-pay | Admitting: Internal Medicine

## 2014-12-02 NOTE — Telephone Encounter (Signed)
Patient would like to know if he could get another group of proctologists.  He needs a recommendation because he is not impressed with this group.

## 2014-12-02 NOTE — Telephone Encounter (Signed)
Spoke with wife given information regarding alternatives

## 2014-12-02 NOTE — Telephone Encounter (Signed)
There is only one group os surgeons in [place]. He may try group in [facility].

## 2014-12-15 ENCOUNTER — Ambulatory Visit (INDEPENDENT_AMBULATORY_CARE_PROVIDER_SITE_OTHER): Payer: Self-pay | Admitting: Emergency Medicine

## 2014-12-15 VITALS — BP 136/88 | HR 96 | Temp 98.6°F | Resp 16 | Ht 71.0 in | Wt 230.6 lb

## 2014-12-15 DIAGNOSIS — H539 Unspecified visual disturbance: Secondary | ICD-10-CM

## 2014-12-15 DIAGNOSIS — Z0000 Encounter for general adult medical examination without abnormal findings: Secondary | ICD-10-CM

## 2014-12-15 NOTE — Progress Notes (Addendum)
This chart was scribed for Arlyss Queen, MD by Leandra Kern, Medical Scribe. This patient was seen in Room 9 and the patient's care was started at 10:33 AM.   Chief Complaint:  Chief Complaint  Patient presents with  . Employment Physical    DOT    HPI: Johnny [name] is a 64 y.o. 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  Pt notes that he was diagnosed with TIA episode by the hospital, however his neurologist noted that it was not a TIA rather his symptoms represent ocular migraine. Pt states that he can sense that he is getting symptoms before his episode. He reports that he gets eye twitching spells where he slowly starts feeling the sensation of his vision slowly "closing off" and eventually losing vision in one eye. He notes that he has had two episodes of these symptoms so far, one of which was while he was driving. Pt reports that he only takes Aspirin. Pt notes that he otherwise is going well.   Past Medical History  Diagnosis Date  . Hyperlipidemia   . Hypertension   . GERD (gastroesophageal reflux disease)   . Osteopenia   . Internal hemorrhoids   . Adenomatous polyps   . Low testosterone   . Vitamin D deficiency   . Chronic back pain    Past Surgical History  Procedure Laterality Date  . Back surgery     History   Social History  . Marital Status: Married    Spouse Name: N/A  . Number of Children: N/A  . Years of Education: N/A   Social History Main Topics  . Smoking status: Former Smoker -- 10 years    Types: Cigarettes    Quit date: 01/04/1977  . Smokeless tobacco: Never Used  . Alcohol Use: 0.0 oz/week    0 Standard drinks or equivalent per week     Comment: rarely  . Drug Use: No  . Sexual Activity: Not on file   Other Topics Concern  . None   Social History Narrative   Family History  Problem Relation Age of Onset  . Heart disease Mother   . Stroke Mother    Allergies  Allergen Reactions  .  Morphine And Related Nausea And Vomiting  . Hydrocodone Nausea Only   Prior to Admission medications   Medication Sig Start Date End Date Taking? Authorizing Provider  Albuterol Sulfate (PROAIR RESPICLICK) 841 (90 BASE) MCG/ACT AEPB Inhale 2 puffs into the lungs every 4 (four) hours as needed. Patient taking differently: Inhale 2 puffs into the lungs every 4 (four) hours as needed (SOB).  10/21/14  Yes Shawnee Knapp, MD  aspirin 325 MG tablet Take 325 mg by mouth daily.   Yes Historical Provider, MD  CIALIS 5 MG tablet Take 5 mg by mouth daily as needed for erectile dysfunction.  12/27/11  Yes Historical Provider, MD  losartan-hydrochlorothiazide (HYZAAR) 100-12.5 MG per tablet TAKE 1 TABLET BY MOUTH DAILY. 09/20/14  Yes [name], MD  omeprazole (PRILOSEC) 20 MG capsule Take 20 mg by mouth daily.     Yes Historical Provider, MD  oxyCODONE-acetaminophen (PERCOCET) 10-325 MG per tablet Take 1 tablet by mouth every 4 (four) hours as needed for pain.    Yes Historical Provider, MD  VYVANSE 60 MG capsule Take 60 mg by mouth daily. 06/09/14  Yes Historical Provider, MD  chlorpheniramine-HYDROcodone (TUSSIONEX PENNKINETIC ER) 10-8 MG/5ML SUER Take 5 mLs  by mouth every 12 (twelve) hours as needed for cough. Patient not taking: Reported on 12/15/2014 10/21/14   Shawnee Knapp, MD  citalopram (CELEXA) 20 MG tablet TAKE 1 TABLET BY MOUTH EVERY DAY Patient not taking: Reported on 12/15/2014 11/26/12   [name], MD  clopidogrel (PLAVIX) 75 MG tablet Take 1 tablet (75 mg total) by mouth daily. Patient not taking: Reported on 10/21/2014 07/02/14   Ripudeep Krystal Eaton, MD  gabapentin (NEURONTIN) 300 MG capsule Take 300 mg by mouth daily as needed (Delta).  05/09/14   Historical Provider, MD  HYDROmorphone (DILAUDID) 4 MG tablet Take 1 tablet (4 mg total) by mouth every 4 (four) hours as needed for severe pain. Patient not taking: Reported on 12/15/2014 10/26/14   Shanon Rosser, MD  ondansetron (ZOFRAN ODT) 8 MG  disintegrating tablet Take 1 tablet (8 mg total) by mouth every 8 (eight) hours as needed for nausea or vomiting. Patient not taking: Reported on 12/15/2014 10/26/14   Shanon Rosser, MD  rosuvastatin (CRESTOR) 10 MG tablet Take 1 tablet (10 mg total) by mouth daily. Patient not taking: Reported on 12/15/2014 09/10/13   [name], MD  tamsulosin (FLOMAX) 0.4 MG CAPS capsule Take 1 capsule daily until stone passes. Patient not taking: Reported on 12/15/2014 10/26/14   Shanon Rosser, MD     ROS:   All other systems have been reviewed and were otherwise negative with the exception of those mentioned in the HPI and as above.    PHYSICAL EXAM: Filed Vitals:   12/15/14 0950  BP: 136/88  Pulse: 96  Temp: 98.6 F (37 C)  Resp: 16   Body mass index is 32.18 kg/(m^2).   General: Alert, no acute distress HEENT:  Normocephalic, atraumatic, oropharynx patent. Eye: Juliette Mangle [facility] Cardiovascular:  Regular rate and rhythm, no rubs murmurs or gallops.  No Carotid bruits, radial pulse intact. No pedal edema.  Respiratory: Clear to auscultation bilaterally.  No wheezes, rales, or rhonchi.  No cyanosis, no use of accessory musculature Abdominal: No organomegaly, abdomen is soft and non-tender, positive bowel sounds.  No masses. Musculoskeletal: Gait intact. No edema, tenderness Skin: No rashes. Neurologic: Facial musculature symmetric. Psychiatric: Patient acts appropriately throughout our interaction. Lymphatic: No cervical or submandibular lymphadenopathy Genitourinary/Anorectal: No acute findings    LABS: Results for orders placed or performed during the hospital encounter of 10/26/14  Urinalysis, Routine w reflex microscopic (not at [facility])  Result Value Ref Range   Color, Urine YELLOW YELLOW   APPearance CLEAR CLEAR   Specific Gravity, Urine 1.016 1.005 - 1.030   pH 5.5 5.0 - 8.0   Glucose, UA NEGATIVE NEGATIVE mg/dL   Hgb urine dipstick MODERATE (A) NEGATIVE   Bilirubin Urine NEGATIVE  NEGATIVE   Ketones, ur NEGATIVE NEGATIVE mg/dL   Protein, ur NEGATIVE NEGATIVE mg/dL   Urobilinogen, UA 0.2 0.0 - 1.0 mg/dL   Nitrite NEGATIVE NEGATIVE   Leukocytes, UA NEGATIVE NEGATIVE  Urine microscopic-add on  Result Value Ref Range   WBC, UA 0-2 <3 WBC/hpf   RBC / HPF 21-50 <3 RBC/hpf   Bacteria, UA RARE RARE     EKG/XRAY:   Primary read interpreted by Dr. Everlene Farrier at [facility].   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 He was hospitalized this spring with a suspected TIA. His symptoms were possibly related to a TIA versus an ocular migraine. He has not seen the neurologist postdischarge. He did not get his Plavix filled. I did not give him  a DOT card today. Referral made to Dr. Jaynee Eagles for Dr. Cathren Laine opinion whether this is an ocular migraine or patient suffered a TIA.I personally performed the services described in this documentation, which was scribed in my presence. The recorded information has been reviewed and is accurate.  Nena Jordan, MD     Gross sideeffects, risk and benefits, and alternatives of medications d/w patient. Patient is aware that all medications have potential sideeffects and we are unable to predict every sideeffect or drug-drug interaction that may occur.  Arlyss Queen MD 12/15/2014 10:33 AM

## 2014-12-16 ENCOUNTER — Ambulatory Visit (INDEPENDENT_AMBULATORY_CARE_PROVIDER_SITE_OTHER): Payer: Commercial Managed Care - HMO | Admitting: Neurology

## 2014-12-16 ENCOUNTER — Encounter: Payer: Self-pay | Admitting: Neurology

## 2014-12-16 VITALS — BP 132/86 | HR 100 | Ht 71.0 in | Wt 231.0 lb

## 2014-12-16 DIAGNOSIS — G43109 Migraine with aura, not intractable, without status migrainosus: Secondary | ICD-10-CM

## 2014-12-16 DIAGNOSIS — I679 Cerebrovascular disease, unspecified: Secondary | ICD-10-CM | POA: Diagnosis not present

## 2014-12-16 NOTE — Progress Notes (Signed)
PATIENT: Johnny [name] DOB: 10/15/1950  Chief Complaint  Patient presents with  . Possible Ocular Migraines    He has had two previous episodes of vision difficulty followed by a mild headache. These events were several years apart. He went to the hospital both times and had a full battery of tests that were normal.    HISTORICAL  Johnny [name] is a 64 yo RH male with his wife, seen in refer by Dr. Everlene Farrier for hospital follow-up in February 2016  He had a history of hypertension, migraine, recent diagnosis of ADHD, is taking Adderall, Vyvanse.  He was admitted to hospital in July 01 2014, while driving he noticed sudden onset shadow coming from right peripheral visual field, as it he is seen throughout broken passes through his right eye, he has to pull his car over, took 2 tablets of 325 mg aspirin, his vision difficulty gradually cleared up within 20 to 30 minutes, followed by mild right-sided pressure headaches.  We have reviewed MRI of the brain in February 2016, mild small vessel disease, no acute lesions, Echocardiogram no significant abnormality Ultrasound of carotid artery, less than 39% stenosis bilateral internal carotid artery Laboratory evaluation, A1c 5.7, normal CMP with exception of elevated glucose 127, normal CBC  He has a yearly physical examination because he works as Geophysical data processor, was advised to keep neurology follow-up as mentioned upon hospital discharge, he still takes aspirin 325 mg daily  He had long-standing history of migraine, usually preceded by shadowing coming over his right visual field, similar most severe episode in 2008, has been taking aspirin since, he only has migraines 1-2 times each year  REVIEW OF SYSTEMS: Full 14 system review of systems performed and notable only for as above  ALLERGIES: Allergies  Allergen Reactions  . Morphine And Related Nausea And Vomiting  . Hydrocodone Nausea Only    HOME MEDICATIONS: Current  Outpatient Prescriptions  Medication Sig Dispense Refill  . amphetamine-dextroamphetamine (ADDERALL) 15 MG tablet     . aspirin 325 MG tablet Take 325 mg by mouth daily.    Marland Kitchen omeprazole (PRILOSEC) 20 MG capsule Take 20 mg by mouth daily.      Marland Kitchen oxyCODONE-acetaminophen (PERCOCET) 10-325 MG per tablet Take 1 tablet by mouth every 4 (four) hours as needed for pain.     . tamsulosin (FLOMAX) 0.4 MG CAPS capsule Take 1 capsule daily until stone passes. (Patient not taking: Reported on 12/15/2014) 30 capsule 0  . VYVANSE 60 MG capsule Take 60 mg by mouth daily.  0   PAST MEDICAL HISTORY: Past Medical History  Diagnosis Date  . Hyperlipidemia   . Hypertension   . GERD (gastroesophageal reflux disease)   . Osteopenia   . Internal hemorrhoids   . Adenomatous polyps   . Low testosterone   . Vitamin D deficiency   . Chronic back pain     PAST SURGICAL HISTORY: Past Surgical History  Procedure Laterality Date  . Back surgery      2 surgeries    FAMILY HISTORY: Family History  Problem Relation Age of Onset  . Heart disease Mother   . Stroke Mother   . Heart attack Mother   . Emphysema Father     SOCIAL HISTORY:  History   Social History  . Marital Status: Married    Spouse Name: N/A  . Number of Children: 4  . Years of Education: 14   Occupational History  . Transfer driver  Social History Main Topics  . Smoking status: Former Smoker -- 10 years    Types: Cigarettes    Quit date: 01/04/1977  . Smokeless tobacco: Never Used  . Alcohol Use: No     Comment: rarely  . Drug Use: No  . Sexual Activity: Not on file   Other Topics Concern  . Not on file   Social History Narrative   Lives at home with his wife.   Right-handed.   2 cups caffeine per day.     PHYSICAL EXAM   Filed Vitals:   12/16/14 1612  BP: 132/86  Pulse: 100  Height: 5\' 11"  (1.803 m)  Weight: 231 lb (104.781 kg)    Not recorded      Body mass index is 32.23 kg/(m^2).  PHYSICAL  EXAMNIATION:  Gen: NAD, conversant, well nourised, obese, well groomed                     Cardiovascular: Regular rate rhythm, no peripheral edema, warm, nontender. Eyes: Conjunctivae clear without exudates or hemorrhage Neck: Supple, no carotid bruise. Pulmonary: Clear to auscultation bilaterally   NEUROLOGICAL EXAM:  MENTAL STATUS: Speech:    Speech is normal; fluent and spontaneous with normal comprehension.  Cognition:    The patient is oriented to person, place, and time;     recent and remote memory intact;     language fluent;     normal attention, concentration,     fund of knowledge.  CRANIAL NERVES: CN II: Visual fields are full to confrontation. Fundoscopic exam is normal with sharp discs and no vascular changes. Pupils are 4 mm and briskly reactive to light. Visual acuity is 20/20 bilaterally. CN III, IV, VI: extraocular movement are normal. No ptosis. CN V: Facial sensation is intact to pinprick in all 3 divisions bilaterally. Corneal responses are intact.  CN VII: Face is symmetric with normal eye closure and smile. CN VIII: Hearing is normal to rubbing fingers CN IX, X: Palate elevates symmetrically. Phonation is normal. CN XI: Head turning and shoulder shrug are intact CN XII: Tongue is midline with normal movements and no atrophy.  MOTOR: There is no pronator drift of out-stretched arms. Muscle bulk and tone are normal. Muscle strength is normal.  REFLEXES: Reflexes are 2+ and symmetric at the biceps, triceps, knees, and ankles. Plantar responses are flexor.  SENSORY: Light touch, pinprick, position sense, and vibration sense are intact in fingers and toes.  COORDINATION: Rapid alternating movements and fine finger movements are intact. There is no dysmetria on finger-to-nose and heel-knee-shin.   GAIT/STANCE: Posture is normal. Gait is steady with normal steps, base, arm swing, and turning. Heel and toe walking are normal. Tandem gait is normal.  Romberg  is absent.   DIAGNOSTIC DATA (LABS, IMAGING, TESTING) - I reviewed patient records, labs, notes, testing and imaging myself where available.  Lab Results  Component Value Date   WBC 7.0 07/01/2014   HGB 15.1 07/01/2014   HCT 43.9 07/01/2014   MCV 93.6 07/01/2014   PLT 235 07/01/2014      Component Value Date/Time   NA 136 07/01/2014 1659   K 4.1 07/01/2014 1659   CL 102 07/01/2014 1659   CO2 27 07/01/2014 1659   GLUCOSE 127* 07/01/2014 1659   BUN 13 07/01/2014 1659   CREATININE 1.17 07/01/2014 1659   CREATININE 1.02 06/17/2014 0904   CALCIUM 10.0 07/01/2014 1659   PROT 6.6 07/01/2014 1659   ALBUMIN 4.2 07/01/2014 1659  AST 29 07/01/2014 1659   ALT 33 07/01/2014 1659   ALKPHOS 55 07/01/2014 1659   BILITOT 0.8 07/01/2014 1659   GFRNONAA 66* 07/01/2014 1659   GFRAA 76* 07/01/2014 1659   Lab Results  Component Value Date   CHOL 185 07/02/2014   HDL 36* 07/02/2014   LDLCALC 75 07/02/2014   TRIG 372* 07/02/2014   CHOLHDL 5.1 07/02/2014   Lab Results  Component Value Date   HGBA1C 5.7* 07/02/2014   No results found for: [id number] Lab Results  Component Value Date      ASSESSMENT AND PLAN  Johnny [name] is a 64 y.o. male presented with one episode of right visual distortion, followed by right-sided headaches in February 2016, most consistent with migraine with aura  He does has multiple vascular risk factor of hypertension, abnormal glucose, sedentary lifestyle, obesity, chronic migraines  I have suggested him keep aspirin 325 mg daily Moderate exercise, healthy diet Goal blood pressure less than 130/80, LDL less than 70  Return to clinic for new issues   Marcial Pacas, M.D. Ph.D.  [facility] [address] [place], [place] [address] Ph: [phone] Fax: [phone]

## 2014-12-17 ENCOUNTER — Telehealth: Payer: Self-pay | Admitting: Neurology

## 2014-12-17 NOTE — Telephone Encounter (Signed)
Patient called stating he is having difficulty with medication. He is a patient of Dr Jaynee Eagles. He wants to be seen on 8/1 but he is also vague with me regarding information. He is having side effect with tapering Lyrica. Please call and advise. Patient can be reached at [phone].

## 2014-12-17 NOTE — Telephone Encounter (Signed)
Katharine Look - Did you look at the chart. Is this a patient of Dr. Rhea Belton? His name sounded familiar but when I look at the chart it appears dr. Krista Blue saw him. Please double check for me. thanks

## 2014-12-17 NOTE — Telephone Encounter (Signed)
I think this was a staff error, it was my patient Mr. Johnny [name] who called about the Lyrica and not [name]. I am not sure if [name]

## 2014-12-30 ENCOUNTER — Ambulatory Visit: Payer: 59 | Admitting: Neurology

## 2015-03-30 ENCOUNTER — Other Ambulatory Visit: Payer: Self-pay | Admitting: Cardiovascular Disease

## 2015-04-30 ENCOUNTER — Other Ambulatory Visit: Payer: Self-pay | Admitting: Cardiovascular Disease

## 2015-05-12 ENCOUNTER — Encounter: Payer: Self-pay | Admitting: Internal Medicine

## 2015-05-12 ENCOUNTER — Telehealth: Payer: Self-pay | Admitting: Internal Medicine

## 2015-05-12 NOTE — Telephone Encounter (Signed)
Head CT scan in emergency department in June for renal stone. A 5 mm nodule was noted in the lung. Recommended follow-up was suggested. Patient in with wife today and asking about when he should get CT follow-up. He has not smoked in many many years. Would be considered low risk for lung cancer. Recommended follow-up June 2017. He is to remind me to order CT scan June 2017.

## 2015-05-22 DIAGNOSIS — I2510 Atherosclerotic heart disease of native coronary artery without angina pectoris: Secondary | ICD-10-CM

## 2015-05-22 HISTORY — DX: Atherosclerotic heart disease of native coronary artery without angina pectoris: I25.10

## 2015-05-28 ENCOUNTER — Other Ambulatory Visit: Payer: Self-pay | Admitting: Interventional Cardiology

## 2015-06-23 ENCOUNTER — Other Ambulatory Visit: Payer: Commercial Managed Care - HMO | Admitting: Internal Medicine

## 2015-06-23 DIAGNOSIS — E785 Hyperlipidemia, unspecified: Secondary | ICD-10-CM

## 2015-06-23 DIAGNOSIS — R7302 Impaired glucose tolerance (oral): Secondary | ICD-10-CM

## 2015-06-23 DIAGNOSIS — Z79899 Other long term (current) drug therapy: Secondary | ICD-10-CM

## 2015-06-23 DIAGNOSIS — Z0000 Encounter for general adult medical examination without abnormal findings: Secondary | ICD-10-CM

## 2015-06-23 DIAGNOSIS — Z125 Encounter for screening for malignant neoplasm of prostate: Secondary | ICD-10-CM

## 2015-06-23 DIAGNOSIS — I10 Essential (primary) hypertension: Secondary | ICD-10-CM

## 2015-06-23 LAB — CBC WITH DIFFERENTIAL/PLATELET
Basophils Absolute: 0 10*3/uL (ref 0.0–0.1)
Basophils Relative: 0 % (ref 0–1)
EOS PCT: 1 % (ref 0–5)
Eosinophils Absolute: 0.1 10*3/uL (ref 0.0–0.7)
HEMATOCRIT: 45.3 % (ref 39.0–52.0)
Hemoglobin: 15.6 g/dL (ref 13.0–17.0)
LYMPHS ABS: 2.1 10*3/uL (ref 0.7–4.0)
LYMPHS PCT: 29 % (ref 12–46)
MCH: 32.4 pg (ref 26.0–34.0)
MCHC: 34.4 g/dL (ref 30.0–36.0)
MCV: 94 fL (ref 78.0–100.0)
MONO ABS: 0.7 10*3/uL (ref 0.1–1.0)
MPV: 9.5 fL (ref 8.6–12.4)
Monocytes Relative: 10 % (ref 3–12)
Neutro Abs: 4.3 10*3/uL (ref 1.7–7.7)
Neutrophils Relative %: 60 % (ref 43–77)
Platelets: 240 10*3/uL (ref 150–400)
RBC: 4.82 MIL/uL (ref 4.22–5.81)
RDW: 13.5 % (ref 11.5–15.5)
WBC: 7.2 10*3/uL (ref 4.0–10.5)

## 2015-06-23 LAB — COMPLETE METABOLIC PANEL WITH GFR
ALT: 28 U/L (ref 9–46)
AST: 22 U/L (ref 10–35)
Albumin: 4.5 g/dL (ref 3.6–5.1)
Alkaline Phosphatase: 51 U/L (ref 40–115)
BUN: 13 mg/dL (ref 7–25)
CALCIUM: 9.8 mg/dL (ref 8.6–10.3)
CHLORIDE: 102 mmol/L (ref 98–110)
CO2: 25 mmol/L (ref 20–31)
CREATININE: 1.05 mg/dL (ref 0.70–1.25)
GFR, Est African American: 88 mL/min (ref >=60)
GFR, Est Non African American: 76 mL/min (ref >=60)
GLUCOSE: 84 mg/dL (ref 65–99)
Potassium: 4.2 mmol/L (ref 3.5–5.3)
Sodium: 139 mmol/L (ref 135–146)
Total Bilirubin: 0.9 mg/dL (ref 0.2–1.2)
Total Protein: 6.9 g/dL (ref 6.1–8.1)

## 2015-06-23 LAB — LIPID PANEL
CHOL/HDL RATIO: 4.9 ratio (ref <=5.0)
CHOLESTEROL: 216 mg/dL — AB (ref 125–200)
HDL: 44 mg/dL (ref >=40)
LDL Cholesterol: 111 mg/dL (ref <130)
Triglycerides: 307 mg/dL — ABNORMAL HIGH (ref <150)
VLDL: 61 mg/dL — AB (ref <30)

## 2015-06-23 LAB — HEMOGLOBIN A1C
HEMOGLOBIN A1C: 5.4 % (ref <5.7)
MEAN PLASMA GLUCOSE: 108 mg/dL (ref <117)

## 2015-06-24 ENCOUNTER — Encounter: Payer: Self-pay | Admitting: Internal Medicine

## 2015-06-24 ENCOUNTER — Ambulatory Visit (INDEPENDENT_AMBULATORY_CARE_PROVIDER_SITE_OTHER): Payer: Commercial Managed Care - HMO | Admitting: Internal Medicine

## 2015-06-24 VITALS — BP 136/88 | HR 86 | Temp 97.9°F | Resp 20 | Ht 72.0 in | Wt 225.0 lb

## 2015-06-24 DIAGNOSIS — K648 Other hemorrhoids: Secondary | ICD-10-CM | POA: Diagnosis not present

## 2015-06-24 DIAGNOSIS — E785 Hyperlipidemia, unspecified: Secondary | ICD-10-CM | POA: Diagnosis not present

## 2015-06-24 DIAGNOSIS — E291 Testicular hypofunction: Secondary | ICD-10-CM

## 2015-06-24 DIAGNOSIS — E8881 Metabolic syndrome: Secondary | ICD-10-CM

## 2015-06-24 DIAGNOSIS — M549 Dorsalgia, unspecified: Secondary | ICD-10-CM | POA: Diagnosis not present

## 2015-06-24 DIAGNOSIS — E669 Obesity, unspecified: Secondary | ICD-10-CM

## 2015-06-24 DIAGNOSIS — I10 Essential (primary) hypertension: Secondary | ICD-10-CM | POA: Diagnosis not present

## 2015-06-24 DIAGNOSIS — Z0000 Encounter for general adult medical examination without abnormal findings: Secondary | ICD-10-CM

## 2015-06-24 DIAGNOSIS — F909 Attention-deficit hyperactivity disorder, unspecified type: Secondary | ICD-10-CM

## 2015-06-24 DIAGNOSIS — G8929 Other chronic pain: Secondary | ICD-10-CM

## 2015-06-24 DIAGNOSIS — M8580 Other specified disorders of bone density and structure, unspecified site: Secondary | ICD-10-CM | POA: Diagnosis not present

## 2015-06-24 DIAGNOSIS — R911 Solitary pulmonary nodule: Secondary | ICD-10-CM | POA: Diagnosis not present

## 2015-06-24 DIAGNOSIS — R7989 Other specified abnormal findings of blood chemistry: Secondary | ICD-10-CM

## 2015-06-24 DIAGNOSIS — Z8639 Personal history of other endocrine, nutritional and metabolic disease: Secondary | ICD-10-CM

## 2015-06-24 DIAGNOSIS — Z87442 Personal history of urinary calculi: Secondary | ICD-10-CM

## 2015-06-24 DIAGNOSIS — F988 Other specified behavioral and emotional disorders with onset usually occurring in childhood and adolescence: Secondary | ICD-10-CM

## 2015-06-24 LAB — POCT URINALYSIS DIPSTICK
Bilirubin, UA: NEGATIVE
Glucose, UA: NEGATIVE
Ketones, UA: NEGATIVE
Leukocytes, UA: NEGATIVE
Nitrite, UA: NEGATIVE
PROTEIN UA: NEGATIVE
RBC UA: NEGATIVE
SPEC GRAV UA: 1.02
Urobilinogen, UA: 0.2
pH, UA: 7.5

## 2015-06-24 LAB — PSA: PSA: 0.8 ng/mL (ref <=4.00)

## 2015-06-24 MED ORDER — ROSUVASTATIN CALCIUM 20 MG PO TABS: 20.0000 mg | ORAL_TABLET | Freq: Every day | ORAL | Status: DC

## 2015-06-24 NOTE — Patient Instructions (Signed)
Repeat CT of chest in June for nodule. Restart Crestor. Follow up  Lipid panel  in 3 months. Bone density study.

## 2015-07-13 ENCOUNTER — Encounter (HOSPITAL_COMMUNITY): Payer: Self-pay | Admitting: Emergency Medicine

## 2015-07-13 ENCOUNTER — Emergency Department (HOSPITAL_COMMUNITY): Payer: Commercial Managed Care - HMO

## 2015-07-13 ENCOUNTER — Emergency Department (HOSPITAL_COMMUNITY)
Admission: EM | Admit: 2015-07-13 | Discharge: 2015-07-13 | Disposition: A | Discharge status: Home / Self Care | Payer: Commercial Managed Care - HMO | Attending: Emergency Medicine | Admitting: Emergency Medicine

## 2015-07-13 DIAGNOSIS — Z79899 Other long term (current) drug therapy: Secondary | ICD-10-CM | POA: Diagnosis not present

## 2015-07-13 DIAGNOSIS — Z7982 Long term (current) use of aspirin: Secondary | ICD-10-CM | POA: Diagnosis not present

## 2015-07-13 DIAGNOSIS — E785 Hyperlipidemia, unspecified: Secondary | ICD-10-CM | POA: Diagnosis not present

## 2015-07-13 DIAGNOSIS — Z86018 Personal history of other benign neoplasm: Secondary | ICD-10-CM | POA: Insufficient documentation

## 2015-07-13 DIAGNOSIS — Z87891 Personal history of nicotine dependence: Secondary | ICD-10-CM | POA: Diagnosis not present

## 2015-07-13 DIAGNOSIS — R1031 Right lower quadrant pain: Secondary | ICD-10-CM | POA: Diagnosis present

## 2015-07-13 DIAGNOSIS — N201 Calculus of ureter: Secondary | ICD-10-CM | POA: Diagnosis not present

## 2015-07-13 DIAGNOSIS — G8929 Other chronic pain: Secondary | ICD-10-CM | POA: Insufficient documentation

## 2015-07-13 DIAGNOSIS — Z8739 Personal history of other diseases of the musculoskeletal system and connective tissue: Secondary | ICD-10-CM | POA: Diagnosis not present

## 2015-07-13 DIAGNOSIS — K219 Gastro-esophageal reflux disease without esophagitis: Secondary | ICD-10-CM | POA: Diagnosis not present

## 2015-07-13 DIAGNOSIS — R231 Pallor: Secondary | ICD-10-CM | POA: Diagnosis not present

## 2015-07-13 DIAGNOSIS — I10 Essential (primary) hypertension: Secondary | ICD-10-CM | POA: Diagnosis not present

## 2015-07-13 LAB — URINALYSIS, ROUTINE W REFLEX MICROSCOPIC
Bilirubin Urine: NEGATIVE
Glucose, UA: NEGATIVE mg/dL
HGB URINE DIPSTICK: NEGATIVE
Ketones, ur: NEGATIVE mg/dL
Leukocytes, UA: NEGATIVE
Nitrite: NEGATIVE
PROTEIN: NEGATIVE mg/dL
SPECIFIC GRAVITY, URINE: 1.016 (ref 1.005–1.030)
pH: 7 (ref 5.0–8.0)

## 2015-07-13 MED ORDER — SODIUM CHLORIDE 0.9 % IV SOLN
Freq: Once | INTRAVENOUS | Status: AC
  Administered 2015-07-13: 06:00:00 via INTRAVENOUS

## 2015-07-13 MED ORDER — TAMSULOSIN HCL 0.4 MG PO CAPS: ORAL_CAPSULE | ORAL | Status: DC

## 2015-07-13 MED ORDER — ONDANSETRON 4 MG PO TBDP: 4.0000 mg | ORAL_TABLET | Freq: Three times a day (TID) | ORAL | Status: DC | PRN

## 2015-07-13 MED ORDER — LORAZEPAM 2 MG/ML IJ SOLN
1.0000 mg | Freq: Once | INTRAMUSCULAR | Status: AC
  Administered 2015-07-13: 1 mg via INTRAVENOUS
  Filled 2015-07-13: qty 1

## 2015-07-13 MED ORDER — HYDROMORPHONE HCL 1 MG/ML IJ SOLN
1.0000 mg | Freq: Once | INTRAMUSCULAR | Status: AC
  Administered 2015-07-13: 1 mg via INTRAVENOUS
  Filled 2015-07-13: qty 1

## 2015-07-13 MED ORDER — KETOROLAC TROMETHAMINE 30 MG/ML IJ SOLN
30.0000 mg | Freq: Once | INTRAMUSCULAR | Status: AC
  Administered 2015-07-13: 30 mg via INTRAVENOUS
  Filled 2015-07-13: qty 1

## 2015-07-13 MED ORDER — ONDANSETRON HCL 4 MG/2ML IJ SOLN
4.0000 mg | Freq: Once | INTRAMUSCULAR | Status: AC
  Administered 2015-07-13: 4 mg via INTRAVENOUS
  Filled 2015-07-13: qty 2

## 2015-07-13 NOTE — ED Notes (Signed)
Pt took two dilaudid tablets prior to leaving home

## 2015-07-13 NOTE — ED Provider Notes (Signed)
CSN: [id number]     Arrival date & time 07/13/15  0535 History   First MD Initiated Contact with Patient 07/13/15 [phone]   Chief Complaint  Patient presents with  . Abdominal Pain     (Consider location/radiation/quality/duration/timing/severity/associated sxs/prior Treatment) HPI patient reports last night about 10 PM he started having some mild discomfort in his right lower quadrant. However 4 AM it awakened him from sleep with acute severe pain in his right lower quadrant and radiates up to his chest and into his right flank. He has had nausea without vomiting. He's not had fever. He states he's had kidney stones in the past and this feels similar. He states he has been able to pass his stones on his own without intervention in the past. He states he does drink caffeine daily with several cups of coffee. He denies milk intake. Patient took 2 dilaudid pills at home and a nausea medicine twice without relief. Patient states he can take morphine as it makes him sick.   PCP Dr Lenice Llamas Urology Dr Jeffie Pollock  Past Medical History  Diagnosis Date  . Hyperlipidemia   . Hypertension   . GERD (gastroesophageal reflux disease)   . Osteopenia   . Internal hemorrhoids   . Adenomatous polyps   . Low testosterone   . Vitamin D deficiency   . Chronic back pain    Past Surgical History  Procedure Laterality Date  . Back surgery      2 surgeries   Family History  Problem Relation Age of Onset  . Heart disease Mother   . Stroke Mother   . Heart attack Mother   . Emphysema Father    Social History  Substance Use Topics  . Smoking status: Former Smoker -- 10 years    Types: Cigarettes    Quit date: 01/04/1977  . Smokeless tobacco: Never Used  . Alcohol Use: No     Comment: rarely  lives with spouse  Review of Systems  All other systems reviewed and are negative.     Allergies  Morphine and related and Hydrocodone  Home Medications   Prior to Admission medications   Medication  Sig Start Date End Date Taking? Authorizing Provider  aspirin 325 MG tablet Take 325 mg by mouth daily.   Yes Historical Provider, MD  gabapentin (NEURONTIN) 300 MG capsule Take 300 mg by mouth at bedtime.   Yes Historical Provider, MD  losartan-hydrochlorothiazide (HYZAAR) 100-12.5 MG tablet TAKE 1 TABLET BY MOUTH DAILY. 05/30/15  Yes [name], MD  METADATE CD 60 MG CR capsule Take 60 mg by mouth every morning.  06/21/15  Yes Historical Provider, MD  methylphenidate (RITALIN) 20 MG tablet Take 20 mg by mouth daily. 06/15/15  Yes Historical Provider, MD  omeprazole (PRILOSEC) 20 MG capsule Take 20 mg by mouth daily.     Yes Historical Provider, MD  oxyCODONE-acetaminophen (PERCOCET) 10-325 MG tablet Take 2 tablets by mouth every 6 (six) hours as needed for pain. Patient gets #180 pills monthly from Dr. Carloyn Manner, last filled February 6.   Yes Historical Provider, MD  rosuvastatin (CRESTOR) 20 MG tablet Take 1 tablet (20 mg total) by mouth daily. 06/24/15  Yes [name], MD  ondansetron (ZOFRAN ODT) 4 MG disintegrating tablet Take 1 tablet (4 mg total) by mouth every 8 (eight) hours as needed for nausea or vomiting. 07/13/15   Rolland Porter, MD  tamsulosin (FLOMAX) 0.4 MG CAPS capsule Take 1 po QD until you pass the  stone. 07/13/15   Rolland Porter, MD   BP 133/95 mmHg  Pulse 70  Temp(Src) 98 F (36.7 C) (Oral)  Resp 22  Ht 6' (1.829 m)  Wt 230 lb (104.327 kg)  BMI 31.19 kg/m2  SpO2 94%  Vital signs normal   Physical Exam  Constitutional: He is oriented to person, place, and time. He appears well-developed and well-nourished.  Non-toxic appearance. He does not appear ill. He appears distressed.  Patient unable to sit still, constantly getting up and down his pain is seen around the room  HENT:  Head: Normocephalic and atraumatic.  Right Ear: External ear normal.  Left Ear: External ear normal.  Nose: Nose normal. No mucosal edema or rhinorrhea.  Mouth/Throat: Oropharynx is clear and moist and  mucous membranes are normal. No dental abscesses or uvula swelling.  Eyes: Conjunctivae and EOM are normal. Pupils are equal, round, and reactive to light.  Neck: Normal range of motion and full passive range of motion without pain. Neck supple.  Cardiovascular: Normal rate, regular rhythm and normal heart sounds.  Exam reveals no gallop and no friction rub.   No murmur heard. Pulmonary/Chest: Effort normal and breath sounds normal. No respiratory distress. He has no wheezes. He has no rhonchi. He has no rales. He exhibits no tenderness and no crepitus.  Abdominal: Soft. Normal appearance and bowel sounds are normal. He exhibits no distension. There is no tenderness. There is no rebound and no guarding.    Patient indicates his pain is in the right lower quadrant radiates up to his chest into his right flank  Musculoskeletal: Normal range of motion. He exhibits no edema or tenderness.  Moves all extremities well.   Neurological: He is alert and oriented to person, place, and time. He has normal strength. No cranial nerve deficit.  Skin: Skin is warm, dry and intact. No rash noted. No erythema. There is pallor.  Psychiatric: He has a normal mood and affect. His speech is normal and behavior is normal. His mood appears not anxious.  Nursing note and vitals reviewed.   ED Course  Procedures (including critical care time)  Medications  0.9 %  sodium chloride infusion ( Intravenous Stopped 07/13/15 0736)  HYDROmorphone (DILAUDID) injection 1 mg (1 mg Intravenous Given 07/13/15 0616)  ondansetron (ZOFRAN) injection 4 mg (4 mg Intravenous Given 07/13/15 0616)  LORazepam (ATIVAN) injection 1 mg (1 mg Intravenous Given 07/13/15 0617)  ketorolac (TORADOL) 30 MG/ML injection 30 mg (30 mg Intravenous Given 07/13/15 0737)    Patient was given IV hydromorphone and Zofran for his pain and nausea.  Patient was rechecked after his CT scan and his pain is greatly improved. He was given IV Toradol. At this  point we are waiting for his urinalysis to return.  Patient was rechecked at 8:30 AM he remains very comfortable.  Review of the [place] shows patient gets #180 oxycodone 10/325 every 23 days from Dr. Christy Sartorius, neurosurgeon. These were last filled on February 6.   Labs Review Results for orders placed or performed during the hospital encounter of 07/13/15  Urinalysis, Routine w reflex microscopic  Result Value Ref Range   Color, Urine YELLOW YELLOW   APPearance CLEAR CLEAR   Specific Gravity, Urine 1.016 1.005 - 1.030   pH 7.0 5.0 - 8.0   Glucose, UA NEGATIVE NEGATIVE mg/dL   Hgb urine dipstick NEGATIVE NEGATIVE   Bilirubin Urine NEGATIVE NEGATIVE   Ketones, ur NEGATIVE NEGATIVE mg/dL   Protein, ur NEGATIVE NEGATIVE  mg/dL   Nitrite NEGATIVE NEGATIVE   Leukocytes, UA NEGATIVE NEGATIVE   Laboratory interpretation all normal     Imaging Review Ct Renal Stone Study  07/13/2015  CLINICAL DATA:  Acute onset of right lower quadrant abdominal pain. Initial encounter. EXAM: CT ABDOMEN AND PELVIS WITHOUT CONTRAST TECHNIQUE: Multidetector CT imaging of the abdomen and pelvis was performed following the standard protocol without IV contrast. COMPARISON:  CT of the abdomen and pelvis from 10/26/2014 FINDINGS: The visualized lung bases are clear. The liver and spleen are unremarkable in appearance. The gallbladder is within normal limits. The pancreas and adrenal glands are unremarkable. There is minimal right-sided hydronephrosis, with mild prominence of the right ureter along its entire course. An obstructing 4 mm stone is noted distally at the right vesicoureteral junction. Scattered bilateral renal stones are seen, measuring up to 6 mm in size. Nonspecific perinephric stranding is noted bilaterally. A 2.0 cm mildly complex left renal cyst is noted, with a tiny focus of increased attenuation in its periphery. No free fluid is identified. The small bowel is unremarkable in appearance.  The stomach is within normal limits. No acute vascular abnormalities are seen. Minimal calcification is noted along the distal abdominal aorta and its branches. The appendix is normal in caliber, without evidence of appendicitis. The colon is largely decompressed. Minimal diverticulosis is noted along the proximal sigmoid colon, without evidence of diverticulitis. The bladder is mildly distended and grossly unremarkable. The prostate remains normal in size, with minimal calcification. No inguinal lymphadenopathy is seen. No acute osseous abnormalities are identified. The patient is status post lumbar spinal fusion at L4-L5, with mild associated degenerative change. IMPRESSION: 1. Minimal right-sided hydronephrosis, with an obstructing 4 mm stone noted distally at the right vesicoureteral junction. 2. Scattered nonobstructing bilateral renal stones measure up to 6 mm in size. 3. 2.0 cm mildly complex left renal cyst is stable in appearance from 2016 and likely benign. 4. Minimal diverticulosis along the proximal sigmoid colon, without evidence of diverticulitis. Electronically Signed   By: Garald Balding M.D.   On: 07/13/2015 07:02   I have personally reviewed and evaluated these images and lab results as part of my medical decision-making.    MDM   Final diagnoses:  Right ureteral stone    New Prescriptions   ONDANSETRON (ZOFRAN ODT) 4 MG DISINTEGRATING TABLET    Take 1 tablet (4 mg total) by mouth every 8 (eight) hours as needed for nausea or vomiting.   TAMSULOSIN (FLOMAX) 0.4 MG CAPS CAPSULE    Take 1 po QD until you pass the stone.    .Plan discharge  Rolland Porter, MD, Barbette Or, MD 07/13/15 [phone]

## 2015-07-13 NOTE — ED Notes (Signed)
MD at bedside. 

## 2015-07-13 NOTE — ED Notes (Signed)
Bed: WA20 Expected date:  Expected time:  Means of arrival:  Comments: 

## 2015-07-13 NOTE — Discharge Instructions (Signed)
Drink plenty of fluids. You already have oxycodone to take for pain. Take the Flomax until he passed a stone, uses Zofran for nausea or vomiting. He follow-up with Dr. Jeffie Pollock about your kidney stone. Return to the ED if you get a fever, or have uncontrolled vomiting or pain. Dietary Guidelines to Help Prevent Kidney Stones Your risk of kidney stones can be decreased by adjusting the foods you eat. The most important thing you can do is drink enough fluid. You should drink enough fluid to keep your urine clear or pale yellow. The following guidelines provide specific information for the type of kidney stone you have had. GUIDELINES ACCORDING TO TYPE OF KIDNEY STONE Calcium Oxalate Kidney Stones  Reduce the amount of salt you eat. Foods that have a lot of salt cause your body to release excess calcium into your urine. The excess calcium can combine with a substance called oxalate to form kidney stones.  Reduce the amount of animal protein you eat if the amount you eat is excessive. Animal protein causes your body to release excess calcium into your urine. Ask your dietitian how much protein from animal sources you should be eating.  Avoid foods that are high in oxalates. If you take vitamins, they should have less than 500 mg of vitamin C. Your body turns vitamin C into oxalates. You do not need to avoid fruits and vegetables high in vitamin C. Calcium Phosphate Kidney Stones  Reduce the amount of salt you eat to help prevent the release of excess calcium into your urine.  Reduce the amount of animal protein you eat if the amount you eat is excessive. Animal protein causes your body to release excess calcium into your urine. Ask your dietitian how much protein from animal sources you should be eating.  Get enough calcium from food or take a calcium supplement (ask your dietitian for recommendations). Food sources of calcium that do not increase your risk of kidney stones include:  Broccoli.  Dairy  products, such as cheese and yogurt.  Pudding. Uric Acid Kidney Stones  Do not have more than 6 oz of animal protein per day. FOOD SOURCES Animal Protein Sources  Meat (all types).  Poultry.  Eggs.  Fish, seafood. Foods High in [employer] seasonings.  Soy sauce.  Teriyaki sauce.  Cured and processed meats.  Salted crackers and snack foods.  Fast food.  Canned soups and most canned foods. Foods High in Oxalates  Grains:  Amaranth.  Barley.  Grits.  Wheat germ.  Bran.  Buckwheat flour.  All bran cereals.  Pretzels.  Whole wheat bread.  Vegetables:  Beans (wax).  Beets and beet greens.  Collard greens.  Eggplant.  [place]ole.  Leeks.  Okra.  Parsley.  Rutabagas.  Spinach.  Swiss chard.  Tomato paste.  Fried potatoes.  Sweet potatoes.  Fruits:  Red currants.  Figs.  Kiwi.  Rhubarb.  Meat and Other Protein Sources:  Beans (dried).  Soy burgers and other soybean products.  Miso.  Nuts (peanuts, almonds, pecans, cashews, hazelnuts).  Nut butters.  Sesame seeds and tahini (paste made of sesame seeds).  Poppy seeds.  Beverages:  Chocolate drink mixes.  Soy milk.  Instant iced tea.  Juices made from high-oxalate fruits or vegetables.  Other:  Carob.  Chocolate.  Fruitcake.  Marmalades.   This information is not intended to replace advice given to you by your health care provider. Make sure you discuss any questions you have with your health care provider.  Document Released: 09/01/2010 Document Revised: 05/12/2013 Document Reviewed: 04/03/2013 Elsevier Interactive Patient Education 2016 Elsevier Inc.  Kidney Stones Kidney stones (urolithiasis) are deposits that form inside your kidneys. The intense pain is caused by the stone moving through the urinary tract. When the stone moves, the ureter goes into spasm around the stone. The stone is usually passed in the urine.  CAUSES   A disorder  that makes certain neck glands produce too much parathyroid hormone (primary hyperparathyroidism).  A buildup of uric acid crystals, similar to gout in your joints.  Narrowing (stricture) of the ureter.  A kidney obstruction present at birth (congenital obstruction).  Previous surgery on the kidney or ureters.  Numerous kidney infections. SYMPTOMS   Feeling sick to your stomach (nauseous).  Throwing up (vomiting).  Blood in the urine (hematuria).  Pain that usually spreads (radiates) to the groin.  Frequency or urgency of urination. DIAGNOSIS   Taking a history and physical exam.  Blood or urine tests.  CT scan.  Occasionally, an examination of the inside of the urinary bladder (cystoscopy) is performed. TREATMENT   Observation.  Increasing your fluid intake.  Extracorporeal shock wave lithotripsy--This is a noninvasive procedure that uses shock waves to break up kidney stones.  Surgery may be needed if you have severe pain or persistent obstruction. There are various surgical procedures. Most of the procedures are performed with the use of small instruments. Only small incisions are needed to accommodate these instruments, so recovery time is minimized. The size, location, and chemical composition are all important variables that will determine the proper choice of action for you. Talk to your health care provider to better understand your situation so that you will minimize the risk of injury to yourself and your kidney.  HOME CARE INSTRUCTIONS   Drink enough water and fluids to keep your urine clear or pale yellow. This will help you to pass the stone or stone fragments.  Strain all urine through the provided strainer. Keep all particulate matter and stones for your health care provider to see. The stone causing the pain may be as small as a grain of salt. It is very important to use the strainer each and every time you pass your urine. The collection of your stone  will allow your health care provider to analyze it and verify that a stone has actually passed. The stone analysis will often identify what you can do to reduce the incidence of recurrences.  Only take over-the-counter or prescription medicines for pain, discomfort, or fever as directed by your health care provider.  Keep all follow-up visits as told by your health care provider. This is important.  Get follow-up X-rays if required. The absence of pain does not always mean that the stone has passed. It may have only stopped moving. If the urine remains completely obstructed, it can cause loss of kidney function or even complete destruction of the kidney. It is your responsibility to make sure X-rays and follow-ups are completed. Ultrasounds of the kidney can show blockages and the status of the kidney. Ultrasounds are not associated with any radiation and can be performed easily in a matter of minutes.  Make changes to your daily diet as told by your health care provider. You may be told to:  Limit the amount of salt that you eat.  Eat 5 or more servings of fruits and vegetables each day.  Limit the amount of meat, poultry, fish, and eggs that you eat.  Collect a 24-hour  urine sample as told by your health care provider.You may need to collect another urine sample every 6-12 months. SEEK MEDICAL CARE IF:  You experience pain that is progressive and unresponsive to any pain medicine you have been prescribed. SEEK IMMEDIATE MEDICAL CARE IF:   Pain cannot be controlled with the prescribed medicine.  You have a fever or shaking chills.  The severity or intensity of pain increases over 18 hours and is not relieved by pain medicine.  You develop a new onset of abdominal pain.  You feel faint or pass out.  You are unable to urinate.   This information is not intended to replace advice given to you by your health care provider. Make sure you discuss any questions you have with your health  care provider.   Document Released: 05/07/2005 Document Revised: 01/26/2015 Document Reviewed: 10/08/2012 Elsevier Interactive Patient Education [employer].

## 2015-07-13 NOTE — ED Notes (Signed)
Pt presents with severe RLQ pain onset 0400, hx of kidney stones, pt dry heaving, writhing in pain

## 2015-08-13 NOTE — Progress Notes (Signed)
Subjective:    Patient ID: Johnny [name], male    DOB: [birth year], 65 y.o.   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 He has a history of osteopenia, hypertension, hyperlipidemia, internal hemorrhoids, adenomatous colon polyps, low testosterone, GE reflux, vitamin D deficiency and prediabetes.  He has seen Dr. [name] in the past for cardiology evaluation. Had treadmill test in 2007 which was normal.  He had anal warts removed in June 2005. He had back surgery by Dr. Glenna Fellows February 2010. 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   He had an upper endoscopy done by Dr. 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 He also had hiatal hernia with reflux esophagitis. History of adenomatous colon polyps on colonoscopy in July 2005. Repeat study June 2010 was normal.  He took Actonel for osteopenia for a while but no longer takes that. Has not had any fractures.  He is on AndroGel for low testosterone. Has taken gabapentin in the past for back pain.  Social history: He is married. Wife has multiple sclerosis. He does not smoke. Very occasional alcohol consumption. 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 3 adult children.  Family history: Father died at 52 due to complications of COPD. Mother died at age 71 with an MI with history of CVA. Maternal and with hypertension. One brother and one sister in good health.  He has a history of recurrent kidney stones. He was in the emergency department June 2016 and had CT of abdomen and pelvis. He was found to have a left ureter 4 mm obstructing stone at the time. He also was found to have a 5 mm nodule in the right middle lobe. Patient is very concerned about this  nodule. I have discussed it with him nail twice. Explained to him twice what the radiologist has recommended. It will be a year this coming June and I've suggested he have repeat CT of the lung at that time. Adult think he is at high risk for bronchogenic carcinoma.  He needs to diet exercise and lose weight.  He has a history of vitamin D deficiency.  History of attention deficit disorder treated with Vyvanse    Review of Systems  Constitutional: Negative.   Respiratory: Negative.   Cardiovascular: Negative.   Psychiatric/Behavioral: Negative.        Objective:   Physical Exam  Constitutional: He is oriented to person, place, and time. He appears well-developed and well-nourished. No distress.  HENT:  Head: Normocephalic and atraumatic.  Right Ear: External ear normal.  Left Ear: External ear normal.  Mouth/Throat: Oropharynx is clear and moist. No oropharyngeal exudate.  Eyes: Conjunctivae are normal. Right eye exhibits no discharge. Left eye exhibits no discharge. No scleral icterus.  Neck: Neck supple. No JVD present. No thyromegaly present.  Cardiovascular: Normal rate, regular rhythm, normal heart sounds and intact distal pulses.   No murmur heard. Pulmonary/Chest: Effort normal and breath sounds normal. No respiratory distress. He has no wheezes. He has no rales. He exhibits no tenderness.  Abdominal: Bowel sounds are normal. He exhibits no distension and no mass. There is no tenderness. There is no rebound and no guarding.  Genitourinary:  Deferred to urologist  Musculoskeletal: He  exhibits no edema.  Lymphadenopathy:    He has no cervical adenopathy.  Neurological: He is alert and oriented to person, place, and time. He has normal reflexes. No cranial nerve deficit. Coordination normal.  Skin: Skin is warm and dry. No rash noted. He is not diaphoretic.  Psychiatric: He has a normal mood and affect. His behavior is normal. Judgment and thought content normal.  Vitals  reviewed.         Assessment & Plan:  Essential hypertension-blood pressure 136/88. Encouraged patient to monitor at home.  Hyperlipidemia-needs to take Crestor on a regular basis. Total cholesterol 216, triglycerides 307, LDL cholesterol 111. Has not been watching diet as closely as he should. Follow-up in 3 monthsWith repeat lipid panel and liver functions.  Osteopenia-no recent bone density study but also has not had any fractures. Have bone density study in the near future.  History of vitamin D deficiency-needs to take 1000 units vitamin D daily  History of adenomatous colon polyps-followed by Dr. Michail Sermon  Metabolic syndrome  Obesity  Internal hemorrhoids  Low testosterone-uses androgen gel pump  GE reflux-stable with PPI  Prediabetes-has elevated triglycerides but hemoglobin A1c is 5.4%  5 mm nodule right middle lobe-to have CT chest in June  History of kidney stones-frequent and recurrent   Plan: Patient will continue same medications. Encouraged diet exercise and weight loss. Return in 6 months. In June 2017 he'll have CT of chest to look at 5 mm nodule right middle lobe.

## 2015-09-10 ENCOUNTER — Inpatient Hospital Stay (HOSPITAL_COMMUNITY)
Admission: EM | Admit: 2015-09-10 | Discharge: 2015-09-13 | DRG: 282 | Disposition: A | Discharge status: Home / Self Care | Payer: 59 | Attending: Internal Medicine | Admitting: Internal Medicine

## 2015-09-10 ENCOUNTER — Emergency Department (HOSPITAL_COMMUNITY): Payer: 59

## 2015-09-10 ENCOUNTER — Encounter (HOSPITAL_COMMUNITY): Payer: Self-pay

## 2015-09-10 DIAGNOSIS — I10 Essential (primary) hypertension: Secondary | ICD-10-CM | POA: Diagnosis not present

## 2015-09-10 DIAGNOSIS — Z87891 Personal history of nicotine dependence: Secondary | ICD-10-CM

## 2015-09-10 DIAGNOSIS — K219 Gastro-esophageal reflux disease without esophagitis: Secondary | ICD-10-CM | POA: Diagnosis not present

## 2015-09-10 DIAGNOSIS — I25118 Atherosclerotic heart disease of native coronary artery with other forms of angina pectoris: Secondary | ICD-10-CM | POA: Insufficient documentation

## 2015-09-10 DIAGNOSIS — M549 Dorsalgia, unspecified: Secondary | ICD-10-CM | POA: Diagnosis not present

## 2015-09-10 DIAGNOSIS — Z7982 Long term (current) use of aspirin: Secondary | ICD-10-CM

## 2015-09-10 DIAGNOSIS — I214 Non-ST elevation (NSTEMI) myocardial infarction: Secondary | ICD-10-CM | POA: Insufficient documentation

## 2015-09-10 DIAGNOSIS — Z885 Allergy status to narcotic agent status: Secondary | ICD-10-CM

## 2015-09-10 DIAGNOSIS — Z79899 Other long term (current) drug therapy: Secondary | ICD-10-CM

## 2015-09-10 DIAGNOSIS — R0789 Other chest pain: Secondary | ICD-10-CM | POA: Diagnosis present

## 2015-09-10 DIAGNOSIS — Z888 Allergy status to other drugs, medicaments and biological substances status: Secondary | ICD-10-CM

## 2015-09-10 DIAGNOSIS — W19XXXA Unspecified fall, initial encounter: Secondary | ICD-10-CM

## 2015-09-10 DIAGNOSIS — I25110 Atherosclerotic heart disease of native coronary artery with unstable angina pectoris: Secondary | ICD-10-CM | POA: Diagnosis present

## 2015-09-10 DIAGNOSIS — I2510 Atherosclerotic heart disease of native coronary artery without angina pectoris: Secondary | ICD-10-CM | POA: Insufficient documentation

## 2015-09-10 DIAGNOSIS — J189 Pneumonia, unspecified organism: Secondary | ICD-10-CM | POA: Diagnosis not present

## 2015-09-10 DIAGNOSIS — Z8673 Personal history of transient ischemic attack (TIA), and cerebral infarction without residual deficits: Secondary | ICD-10-CM

## 2015-09-10 DIAGNOSIS — G8929 Other chronic pain: Secondary | ICD-10-CM | POA: Diagnosis present

## 2015-09-10 DIAGNOSIS — R0781 Pleurodynia: Secondary | ICD-10-CM

## 2015-09-10 DIAGNOSIS — I209 Angina pectoris, unspecified: Secondary | ICD-10-CM | POA: Diagnosis not present

## 2015-09-10 DIAGNOSIS — E7800 Pure hypercholesterolemia, unspecified: Secondary | ICD-10-CM | POA: Diagnosis present

## 2015-09-10 DIAGNOSIS — E785 Hyperlipidemia, unspecified: Secondary | ICD-10-CM | POA: Diagnosis present

## 2015-09-10 DIAGNOSIS — R079 Chest pain, unspecified: Secondary | ICD-10-CM | POA: Diagnosis present

## 2015-09-10 DIAGNOSIS — E559 Vitamin D deficiency, unspecified: Secondary | ICD-10-CM | POA: Diagnosis present

## 2015-09-10 HISTORY — DX: Bronchitis, not specified as acute or chronic: J40

## 2015-09-10 HISTORY — DX: Reserved for inherently not codable concepts without codable children: IMO0001

## 2015-09-10 HISTORY — DX: Transient cerebral ischemic attack, unspecified: G45.9

## 2015-09-10 HISTORY — DX: Ventral hernia without obstruction or gangrene: K43.9

## 2015-09-10 HISTORY — DX: Spondylosis, unspecified: M47.9

## 2015-09-10 LAB — CBC
HEMATOCRIT: 37 % — AB (ref 39.0–52.0)
HEMOGLOBIN: 12.5 g/dL — AB (ref 13.0–17.0)
MCH: 32 pg (ref 26.0–34.0)
MCHC: 33.8 g/dL (ref 30.0–36.0)
MCV: 94.6 fL (ref 78.0–100.0)
Platelets: 221 10*3/uL (ref 150–400)
RBC: 3.91 MIL/uL — ABNORMAL LOW (ref 4.22–5.81)
RDW: 12.2 % (ref 11.5–15.5)
WBC: 12.8 10*3/uL — ABNORMAL HIGH (ref 4.0–10.5)

## 2015-09-10 LAB — BASIC METABOLIC PANEL
Anion gap: 11 (ref 5–15)
BUN: 10 mg/dL (ref 6–20)
CO2: 22 mmol/L (ref 22–32)
Calcium: 9.7 mg/dL (ref 8.9–10.3)
Chloride: 103 mmol/L (ref 101–111)
Creatinine, Ser: 1.05 mg/dL (ref 0.61–1.24)
GFR calc Af Amer: >60 mL/min (ref >60)
GLUCOSE: 95 mg/dL (ref 65–99)
POTASSIUM: 3.7 mmol/L (ref 3.5–5.1)
Sodium: 136 mmol/L (ref 135–145)

## 2015-09-10 LAB — I-STAT TROPONIN, ED: Troponin i, poc: 0.01 ng/mL (ref 0.00–0.08)

## 2015-09-10 NOTE — H&P (Signed)
[facility] History and Physical   Patient: Johnny [name]   PCP: [name], MD DOB: [birth year]   DOA: 09/10/2015   DOS: 09/10/2015   DOS: the patient was seen and examined on 09/10/2015  Referring physician: Dr. Lita Mains Chief Complaint: Chest pain  HPI: Johnny [name] is a 65 y.o. male with Past medical history of Dyslipidemia, essential hypertension, GERD, TIA. The patient presents with complaints of chest pain. Pain is central in location pressure-like, associated with shortness of breath diaphoresis as well as nausea. Pain occurred one week ago and resolved with aspirin and nitroglycerin. Pain improved with nitroglycerin this time, but did not resolve. Patient denies any prior cardiac workup denies having any complaints of nausea or vomiting,acid reflux, leg swelling or leg tenderness of the time of my evaluation. 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 Patient denies any active smoking but was former smoker. Denies having any active bleeding or burning urination.  The patient is coming from home.  At his baseline ambulates Without any support And is independent for most of his ADL; manages his medication on his own.  Review of Systems: as mentioned in the history of present illness.  A comprehensive review of the other systems is negative.  Past Medical History  Diagnosis Date  . Hyperlipidemia   . Hypertension   . GERD (gastroesophageal reflux disease)   . Osteopenia   . Internal hemorrhoids   . Adenomatous polyps   . Low testosterone   . Vitamin D deficiency   . Chronic back pain   . TIA (transient ischemic attack) 2008  . Shortness of breath dyspnea   . Bronchitis   . Ventral hernia   . Spondylosis    Past Surgical History  Procedure Laterality Date  . Back surgery      2 surgeries   Social History:  reports that he quit smoking about 37 years ago. His smoking use  included Cigarettes. He quit after 10 years of use. He has never used smokeless tobacco. He reports that he does not drink alcohol or use illicit drugs.  Allergies  Allergen Reactions  . Crestor [Rosuvastatin Calcium] Other (See Comments)    Joint pain  . Metadate Cd [Methylphenidate Hcl Er (Cd)] Other (See Comments)    Burning, GI upset, bloating  . Morphine And Related Nausea And Vomiting  . Hydrocodone Nausea Only    Can take with food    Family History  Problem Relation Age of Onset  . Heart disease Mother   . Stroke Mother   . Heart attack Mother   . Emphysema Father     Prior to Admission medications   Medication Sig Start Date End Date Taking? Authorizing Provider  aspirin 325 MG tablet Take 325 mg by mouth daily.   Yes Historical Provider, MD  citalopram (CELEXA) 20 MG tablet Take 20 mg by mouth daily. 08/30/15  Yes Historical Provider, MD  gabapentin (NEURONTIN) 300 MG capsule Take 300 mg by mouth at bedtime.   Yes Historical Provider, MD  losartan-hydrochlorothiazide (HYZAAR) 100-12.5 MG tablet TAKE 1 TABLET BY MOUTH DAILY. 05/30/15  Yes [name], MD  methylphenidate (RITALIN) 20 MG tablet Take 20 mg by mouth daily. Reported on 09/10/2015 06/15/15  Yes Historical Provider, MD  Multiple Vitamin (MULTIVITAMIN) tablet Take 1 tablet by mouth daily.   Yes Historical Provider, MD  omeprazole (PRILOSEC) 20 MG capsule Take 20  mg by mouth daily.     Yes Historical Provider, MD  oxyCODONE-acetaminophen (PERCOCET) 10-325 MG tablet Take 2 tablets by mouth every 6 (six) hours as needed for pain. Patient gets #180 pills monthly from Dr. Carloyn Manner, last filled February 6.   Yes Historical Provider, MD    Physical Exam: Filed Vitals:   09/10/15 2315 09/10/15 2345 09/11/15 0014 09/11/15 0519  BP: 108/78 115/71 119/78 122/85  Pulse: 84 83  83  Temp:   98.4 F (36.9 C) 98.3 F (36.8 C)  TempSrc:   Oral   Resp:  18  18  Height:   6' (1.829 m)   Weight:   102.422 kg (225 lb 12.8 oz)     SpO2: 98% 96%  99%    General: Alert, Awake and Oriented to Time, Place and Person. Appear in mild distress Eyes: PERRL, Conjunctiva normal ENT: Oral Mucosa clear moist. Neck: no JVD, noAbnormal Mass Or lumps Cardiovascular: S1 and S2 Present, no Murmur, Peripheral Pulses Present Respiratory: Bilateral Air entry equal and Decreased,  Clear to Auscultation, no Crackles, no wheezes Abdomen: Bowel Sound present, Soft and no tenderness Skin: redness no, no Rash  Extremities: no Pedal edema, no calf tenderness Neurologic: Grossly no focal neuro deficit.Bilaterally Equal motor strength  Labs on Admission:  CBC:  Recent Labs Lab 09/10/15 2025 09/11/15 0040  WBC 12.8* 11.6*  NEUTROABS  --  7.7  HGB 12.5* 12.6*  HCT 37.0* 37.9*  MCV 94.6 95.7  PLT 221 [id number]   Basic Metabolic Panel:  Recent Labs Lab 09/10/15 2025 09/11/15 0040  NA 136 139  K 3.7 3.8  CL 103 103  CO2 22 24  GLUCOSE 95 123*  BUN 10 11  CREATININE 1.05 1.09  CALCIUM 9.7 10.1   GFR: Estimated Creatinine Clearance: 87 mL/min (by C-G formula based on Cr of 1.09). Liver Function Tests:  Recent Labs Lab 09/11/15 0040  AST 31  ALT 36  ALKPHOS 70  BILITOT 1.1  PROT 7.2  ALBUMIN 3.6   No results for input(s): LIPASE, AMYLASE in the last 168 hours. No results for input(s): AMMONIA in the last 168 hours. Coagulation Profile: No results for input(s): INR, PROTIME in the last 168 hours. Cardiac Enzymes:  Recent Labs Lab 09/11/15 0040  TROPONINI 0.39*   BNP (last 3 results) No results for input(s): PROBNP in the last 8760 hours. HbA1C: No results for input(s): HGBA1C in the last 72 hours. CBG: No results for input(s): GLUCAP in the last 168 hours. Lipid Profile: No results for input(s): CHOL, HDL, LDLCALC, TRIG, CHOLHDL, LDLDIRECT in the last 72 hours. Thyroid Function Tests: No results for input(s): TSH, T4TOTAL, FREET4, T3FREE, THYROIDAB in the last 72 hours. Anemia Panel: No results for  input(s): VITAMINB12, FOLATE, FERRITIN, TIBC, IRON, RETICCTPCT in the last 72 hours. Urine analysis:    Component Value Date/Time   COLORURINE YELLOW 07/13/2015 [place] 07/13/2015 0644   LABSPEC 1.016 07/13/2015 0644   PHURINE 7.0 07/13/2015 0644   GLUCOSEU NEGATIVE 07/13/2015 0644   HGBUR NEGATIVE 07/13/2015 0644   BILIRUBINUR NEGATIVE 07/13/2015 0644   BILIRUBINUR neg 06/24/2015 1508   KETONESUR NEGATIVE 07/13/2015 0644   PROTEINUR NEGATIVE 07/13/2015 0644   PROTEINUR neg 06/24/2015 1508   UROBILINOGEN 0.2 06/24/2015 1508   UROBILINOGEN 0.2 10/26/2014 0337   NITRITE NEGATIVE 07/13/2015 0644   NITRITE neg 06/24/2015 1508   LEUKOCYTESUR NEGATIVE 07/13/2015 0644   Sepsis Labs: @LABRCNTIP (procalcitonin:4,lacticidven:4) ) Recent Results (from the past 240 hour(s))  MRSA PCR Screening     Status: None   Collection Time: 09/11/15 12:20 AM  Result Value Ref Range Status   MRSA by PCR NEGATIVE NEGATIVE Final    Comment:        The GeneXpert MRSA Assay (FDA approved for NASAL specimens only), is one component of a comprehensive MRSA colonization surveillance program. It is not intended to diagnose MRSA infection nor to guide or monitor treatment for MRSA infections.      Radiological Exams on Admission: Dg Chest 2 View  09/10/2015  CLINICAL DATA:  Sudden onset of mid chest pain radiating to the back. Shortness of breath. Cough for several months. EXAM: CHEST  2 VIEW COMPARISON:  Chest radiographs 07/12/2008 FINDINGS: The cardiomediastinal contours are normal. Linear opacities in both lung bases consistent with atelectasis. Ill-defined right midlung opacity, not characterized on the lateral view. Pulmonary vasculature is normal. No pleural effusion or pneumothorax. No acute osseous abnormalities are seen. IMPRESSION: Linear bibasilar atelectasis. Ill-defined opacity in the right midlung zone may reflect an additional site of atelectasis, versus infectious or  inflammatory pneumonitis. Electronically Signed   By: Jeb Levering M.D.   On: 09/10/2015 20:35   EKG: Independently reviewed. normal sinus rhythm, nonspecific ST and T waves changes.  Assessment/Plan 1. Chest pain The patient presents with chest pressure with nausea vomiting diaphoresis as well as shortness of breath. his initial EKG and troponin does not show any signs of acute ischemia, But repeat troponin was elevated. Patient was chest pain-free at that point his chest x-ray is showing Evidence of bronchitis versus early pneumonia. his d-dimer Negative, and he is considered a low risk for VTE, therefore no further workup. At present I will admit the patient to the hospital for observation. I will obtain serial troponin every 6 hours, monitor on telemetry, get 2D echocardiogram in the morning to rule out ACS.   Since the patient's troponin is up trending patient will be started on heparin and aspirin will be added. Cardiology will follow formally on 09/11/2015. Add Lipitor 40.  2.Dyslipidemia. Patient will be started on Lipitor.  3. Chronic back pain. Continuing Percocet dose at home. Also continue gabapentin.  4. History of mood disorder. Continue Celexa.  5.Suspected community-acquired Bronchitis. With leukocytosis cough and shortness of breath as well as chest pain I will treat the patient with antibiotics at present. Ceftriaxone and azithromycin for community-acquired coverage. Check sputum culture, urine antigens  6.Essential hypertension. We'll continue home medication.  7. GERD. Continuing PPI.  Nutrition: Nothing by mouth after midnight DVT Prophylaxis: on therapeutic anticoagulation.  Advance goals of care discussion: Full code   Consults: Phone consult with cardiology, formal consult will follow on 09/11/2015  Family Communication: family was present at bedside, at the time of interview.  Opportunity was given to ask question and all questions were  answered satisfactorily.  Disposition: Admitted as observation Stepdown unit.   Author: Berle Mull, MD [facility] Pager: [phone] 09/10/2015  If 7PM-7AM, please contact night-coverage [email or web address] Password [email or web address]

## 2015-09-10 NOTE — ED Notes (Signed)
Per EMS, around 1800 pt was home watching tv and had sudden onset of mid CP radiating to the mid back between the shoulder blades. Pt describes pain as dull pressure. Pt admits to some sob and was diaphoretic with EMS on scene. Pt states that he has had a cough with productive green sputum and some Narcissa Melder sputum. Pt had a similar episode this past Tuesday but did not seek treatment. Pt given 324asa, 5 nitro in route. Pain down from 8/10 to a 2/10 with nitro. EKG showed NSR with some ischemia in the inferior leads. Pt alert and oriented x 4. 115/80, HR 84, 94% on 2L(pt does not wear o2 at home), RR 18.

## 2015-09-10 NOTE — ED Provider Notes (Signed)
CSN: [id number]     Arrival date & time 09/10/15  2000 History   First MD Initiated Contact with Patient 09/10/15 2008     Chief Complaint  Patient presents with  . Chest Pain     (Consider location/radiation/quality/duration/timing/severity/associated sxs/prior Treatment) HPI  Johnny [name] is a(n) 65 y.o. male who presents to the ED with cc of cp. Patient had sudden onset of cp at 6:00PM while watching TV. States that it was pressure like, Severe, radiating to the shoulder blades. He had associated SOB and diaphoresis that resolved. Patient took ASA 324 and Nitro prior to arrival. His pain is improved, but he continues to have retrosternal cp at this time. The patient had a similar episode of CP earlier this week while mowing the yard. He took 325 ASA and rested and the pain resolved after about 1 hour. He has had a recent cough productive of green/brown sputum. He has had 4 months of what he describes as "burning in lungs," whenever he exerts himself (such as walking to th mailbox.)  EMS report ischemia in the inferior leads, however 12 lead EKG here at the ED shows only flipped twaves in lead III without other signs of ischemia or reciprocal changes. Cardiac risk factors include hyperlipidemia, hypertension, age, male gender, and positive family history.  Past Medical History  Diagnosis Date  . Hyperlipidemia   . Hypertension   . GERD (gastroesophageal reflux disease)   . Osteopenia   . Internal hemorrhoids   . Adenomatous polyps   . Low testosterone   . Vitamin D deficiency   . Chronic back pain    Past Surgical History  Procedure Laterality Date  . Back surgery      2 surgeries   Family History  Problem Relation Age of Onset  . Heart disease Mother   . Stroke Mother   . Heart attack Mother   . Emphysema Father    Social History  Substance Use Topics  . Smoking status: Former Smoker -- 10 years    Types: Cigarettes    Quit date: 01/04/1977  . Smokeless tobacco:  Never Used  . Alcohol Use: No     Comment: rarely    Review of Systems  Ten systems reviewed and are negative for acute change, except as noted in the HPI.    Allergies  Crestor; Metadate cd; Morphine and related; and Hydrocodone  Home Medications   Prior to Admission medications   Medication Sig Start Date End Date Taking? Authorizing Provider  aspirin 325 MG tablet Take 325 mg by mouth daily.   Yes Historical Provider, MD  citalopram (CELEXA) 20 MG tablet Take 20 mg by mouth daily. 08/30/15  Yes Historical Provider, MD  gabapentin (NEURONTIN) 300 MG capsule Take 300 mg by mouth at bedtime.   Yes Historical Provider, MD  losartan-hydrochlorothiazide (HYZAAR) 100-12.5 MG tablet TAKE 1 TABLET BY MOUTH DAILY. 05/30/15  Yes [name], MD  methylphenidate (RITALIN) 20 MG tablet Take 20 mg by mouth daily. Reported on 09/10/2015 06/15/15  Yes Historical Provider, MD  Multiple Vitamin (MULTIVITAMIN) tablet Take 1 tablet by mouth daily.   Yes Historical Provider, MD  omeprazole (PRILOSEC) 20 MG capsule Take 20 mg by mouth daily.     Yes Historical Provider, MD  oxyCODONE-acetaminophen (PERCOCET) 10-325 MG tablet Take 2 tablets by mouth every 6 (six) hours as needed for pain. Patient gets #180 pills monthly from Dr. Carloyn Manner, last filled February 6.   Yes Historical Provider, MD  BP 112/68 mmHg  Pulse 74  Resp 13  Ht 6' (1.829 m)  Wt 104.327 kg  BMI 31.19 kg/m2  SpO2 95% Physical Exam  Constitutional: He appears well-developed and well-nourished. No distress.  HENT:  Head: Normocephalic and atraumatic.  Eyes: Conjunctivae are normal. No scleral icterus.  Neck: Normal range of motion. Neck supple.  Cardiovascular: Normal rate, regular rhythm and normal heart sounds.   Pulmonary/Chest: Effort normal and breath sounds normal. No respiratory distress.  Abdominal: Soft. There is no tenderness.  Musculoskeletal: He exhibits no edema.  Neurological: He is alert.  Skin: Skin is warm and dry.  He is not diaphoretic.  Psychiatric: His behavior is normal.  Nursing note and vitals reviewed.   ED Course  Procedures (including critical care time) Labs Review Labs Reviewed  CBC - Abnormal; Notable for the following:    WBC 12.8 (*)    RBC 3.91 (*)    Hemoglobin 12.5 (*)    HCT 37.0 (*)    All other components within normal limits  BASIC METABOLIC PANEL  I-STAT TROPOININ, ED    Imaging Review Dg Chest 2 View  09/10/2015  CLINICAL DATA:  Sudden onset of mid chest pain radiating to the back. Shortness of breath. Cough for several months. EXAM: CHEST  2 VIEW COMPARISON:  Chest radiographs 07/12/2008 FINDINGS: The cardiomediastinal contours are normal. Linear opacities in both lung bases consistent with atelectasis. Ill-defined right midlung opacity, not characterized on the lateral view. Pulmonary vasculature is normal. No pleural effusion or pneumothorax. No acute osseous abnormalities are seen. IMPRESSION: Linear bibasilar atelectasis. Ill-defined opacity in the right midlung zone may reflect an additional site of atelectasis, versus infectious or inflammatory pneumonitis. Electronically Signed   By: Jeb Levering M.D.   On: 09/10/2015 20:35   I have personally reviewed and evaluated these images and lab results as part of my medical decision-making.   EKG Interpretation None      MDM   Final diagnoses:  Chest pain, unspecified chest pain type  Pneumonitis    10:43 PM Patient cxr shows apparent inflammatory vs. Infections area in the R mid lobe.  Patient has a mixed picture concerning for ACS but also appears to have respiratory issues as well. EKG without acute sign of ischemia. Awainting troponin. HEART score is 5-6  patient will be admitted for cp r/o.  Margarita Mail, PA-C 09/10/15 2245  Julianne Rice, MD 09/11/15 0001

## 2015-09-11 ENCOUNTER — Encounter (HOSPITAL_COMMUNITY): Payer: Self-pay | Admitting: *Deleted

## 2015-09-11 ENCOUNTER — Observation Stay (HOSPITAL_COMMUNITY): Payer: 59

## 2015-09-11 DIAGNOSIS — K219 Gastro-esophageal reflux disease without esophagitis: Secondary | ICD-10-CM | POA: Diagnosis not present

## 2015-09-11 DIAGNOSIS — M549 Dorsalgia, unspecified: Secondary | ICD-10-CM | POA: Diagnosis not present

## 2015-09-11 DIAGNOSIS — E7800 Pure hypercholesterolemia, unspecified: Secondary | ICD-10-CM

## 2015-09-11 DIAGNOSIS — E782 Mixed hyperlipidemia: Secondary | ICD-10-CM

## 2015-09-11 DIAGNOSIS — I10 Essential (primary) hypertension: Secondary | ICD-10-CM

## 2015-09-11 DIAGNOSIS — J189 Pneumonia, unspecified organism: Secondary | ICD-10-CM | POA: Diagnosis not present

## 2015-09-11 DIAGNOSIS — I214 Non-ST elevation (NSTEMI) myocardial infarction: Secondary | ICD-10-CM | POA: Insufficient documentation

## 2015-09-11 DIAGNOSIS — I209 Angina pectoris, unspecified: Secondary | ICD-10-CM | POA: Diagnosis not present

## 2015-09-11 LAB — CBC WITH DIFFERENTIAL/PLATELET
BASOS ABS: 0 10*3/uL (ref 0.0–0.1)
Basophils Relative: 0 %
EOS PCT: 1 %
Eosinophils Absolute: 0.1 10*3/uL (ref 0.0–0.7)
HCT: 37.9 % — ABNORMAL LOW (ref 39.0–52.0)
HEMOGLOBIN: 12.6 g/dL — AB (ref 13.0–17.0)
LYMPHS PCT: 23 %
Lymphs Abs: 2.6 10*3/uL (ref 0.7–4.0)
MCH: 31.8 pg (ref 26.0–34.0)
MCHC: 33.2 g/dL (ref 30.0–36.0)
MCV: 95.7 fL (ref 78.0–100.0)
Monocytes Absolute: 1.1 10*3/uL — ABNORMAL HIGH (ref 0.1–1.0)
Monocytes Relative: 10 %
NEUTROS PCT: 66 %
Neutro Abs: 7.7 10*3/uL (ref 1.7–7.7)
PLATELETS: 245 10*3/uL (ref 150–400)
RBC: 3.96 MIL/uL — AB (ref 4.22–5.81)
RDW: 12.2 % (ref 11.5–15.5)
WBC: 11.6 10*3/uL — AB (ref 4.0–10.5)

## 2015-09-11 LAB — COMPREHENSIVE METABOLIC PANEL
ALT: 36 U/L (ref 17–63)
AST: 31 U/L (ref 15–41)
Albumin: 3.6 g/dL (ref 3.5–5.0)
Alkaline Phosphatase: 70 U/L (ref 38–126)
Anion gap: 12 (ref 5–15)
BUN: 11 mg/dL (ref 6–20)
CHLORIDE: 103 mmol/L (ref 101–111)
CO2: 24 mmol/L (ref 22–32)
Calcium: 10.1 mg/dL (ref 8.9–10.3)
Creatinine, Ser: 1.09 mg/dL (ref 0.61–1.24)
GFR calc Af Amer: >60 mL/min (ref >60)
GLUCOSE: 123 mg/dL — AB (ref 65–99)
POTASSIUM: 3.8 mmol/L (ref 3.5–5.1)
SODIUM: 139 mmol/L (ref 135–145)
TOTAL PROTEIN: 7.2 g/dL (ref 6.5–8.1)
Total Bilirubin: 1.1 mg/dL (ref 0.3–1.2)

## 2015-09-11 LAB — TROPONIN I
Troponin I: 0.39 ng/mL — ABNORMAL HIGH (ref <0.031)
Troponin I: 0.35 ng/mL — ABNORMAL HIGH (ref <0.031)

## 2015-09-11 LAB — HEPARIN LEVEL (UNFRACTIONATED)
HEPARIN UNFRACTIONATED: 0.31 [IU]/mL (ref 0.30–0.70)
Heparin Unfractionated: 0.26 IU/mL — ABNORMAL LOW (ref 0.30–0.70)

## 2015-09-11 LAB — MRSA PCR SCREENING: MRSA BY PCR: NEGATIVE

## 2015-09-11 LAB — D-DIMER, QUANTITATIVE (NOT AT ARMC): D DIMER QUANT: 0.31 ug{FEU}/mL (ref 0.00–0.50)

## 2015-09-11 LAB — STREP PNEUMONIAE URINARY ANTIGEN: Strep Pneumo Urinary Antigen: NEGATIVE

## 2015-09-11 LAB — HIV ANTIBODY (ROUTINE TESTING W REFLEX): HIV SCREEN 4TH GENERATION: NONREACTIVE

## 2015-09-11 MED ORDER — SODIUM CHLORIDE 0.9 % WEIGHT BASED INFUSION: 1.0000 mL/kg/h | INTRAVENOUS | Status: DC

## 2015-09-11 MED ORDER — HEPARIN BOLUS VIA INFUSION
4000.0000 [IU] | Freq: Once | INTRAVENOUS | Status: AC
  Administered 2015-09-11: 4000 [IU] via INTRAVENOUS
  Filled 2015-09-11: qty 4000

## 2015-09-11 MED ORDER — SODIUM CHLORIDE 0.9 % IV SOLN: 250.0000 mL | INTRAVENOUS | Status: DC | PRN

## 2015-09-11 MED ORDER — GABAPENTIN 300 MG PO CAPS
300.0000 mg | ORAL_CAPSULE | Freq: Every day | ORAL | Status: DC
  Administered 2015-09-11 – 2015-09-12 (×3): 300 mg via ORAL
  Filled 2015-09-11 (×3): qty 1

## 2015-09-11 MED ORDER — SODIUM CHLORIDE 0.9% FLUSH: 3.0000 mL | Freq: Two times a day (BID) | INTRAVENOUS | Status: DC

## 2015-09-11 MED ORDER — ATORVASTATIN CALCIUM 40 MG PO TABS
40.0000 mg | ORAL_TABLET | Freq: Every day | ORAL | Status: DC
  Filled 2015-09-11 (×2): qty 1

## 2015-09-11 MED ORDER — ACETAMINOPHEN 325 MG PO TABS
650.0000 mg | ORAL_TABLET | ORAL | Status: DC | PRN
  Filled 2015-09-11: qty 2

## 2015-09-11 MED ORDER — NITROGLYCERIN 0.4 MG SL SUBL
0.4000 mg | SUBLINGUAL_TABLET | SUBLINGUAL | Status: DC | PRN
  Administered 2015-09-11 (×2): 0.4 mg via SUBLINGUAL
  Filled 2015-09-11 (×2): qty 1

## 2015-09-11 MED ORDER — ASPIRIN 325 MG PO TABS
325.0000 mg | ORAL_TABLET | Freq: Every day | ORAL | Status: DC
  Administered 2015-09-11 – 2015-09-13 (×2): 325 mg via ORAL
  Filled 2015-09-11 (×2): qty 1

## 2015-09-11 MED ORDER — CITALOPRAM HYDROBROMIDE 20 MG PO TABS
20.0000 mg | ORAL_TABLET | Freq: Every day | ORAL | Status: DC
  Administered 2015-09-11 – 2015-09-13 (×2): 20 mg via ORAL
  Filled 2015-09-11 (×3): qty 1

## 2015-09-11 MED ORDER — OXYCODONE-ACETAMINOPHEN 10-325 MG PO TABS: 2.0000 | ORAL_TABLET | Freq: Four times a day (QID) | ORAL | Status: DC | PRN

## 2015-09-11 MED ORDER — ASPIRIN 81 MG PO CHEW
81.0000 mg | CHEWABLE_TABLET | ORAL | Status: AC
  Administered 2015-09-12: 81 mg via ORAL
  Filled 2015-09-11: qty 1

## 2015-09-11 MED ORDER — ONDANSETRON HCL 4 MG/2ML IJ SOLN
4.0000 mg | Freq: Four times a day (QID) | INTRAMUSCULAR | Status: DC | PRN
  Administered 2015-09-12: 4 mg via INTRAVENOUS
  Filled 2015-09-11: qty 2

## 2015-09-11 MED ORDER — DEXTROSE 5 % IV SOLN
1.0000 g | Freq: Every day | INTRAVENOUS | Status: DC
  Administered 2015-09-11 – 2015-09-12 (×2): 1 g via INTRAVENOUS
  Filled 2015-09-11 (×2): qty 10

## 2015-09-11 MED ORDER — OXYCODONE-ACETAMINOPHEN 5-325 MG PO TABS
2.0000 | ORAL_TABLET | Freq: Four times a day (QID) | ORAL | Status: DC | PRN
  Administered 2015-09-11 – 2015-09-13 (×8): 2 via ORAL
  Filled 2015-09-11 (×8): qty 2

## 2015-09-11 MED ORDER — LOSARTAN POTASSIUM 50 MG PO TABS
100.0000 mg | ORAL_TABLET | Freq: Every day | ORAL | Status: DC
  Administered 2015-09-11 – 2015-09-12 (×2): 100 mg via ORAL
  Filled 2015-09-11 (×2): qty 2

## 2015-09-11 MED ORDER — PANTOPRAZOLE SODIUM 40 MG PO TBEC
40.0000 mg | DELAYED_RELEASE_TABLET | Freq: Every day | ORAL | Status: DC
  Administered 2015-09-11 – 2015-09-13 (×3): 40 mg via ORAL
  Filled 2015-09-11 (×3): qty 1

## 2015-09-11 MED ORDER — GI COCKTAIL ~~LOC~~: 30.0000 mL | Freq: Four times a day (QID) | ORAL | Status: DC | PRN

## 2015-09-11 MED ORDER — HEPARIN (PORCINE) IN NACL 100-0.45 UNIT/ML-% IJ SOLN
1400.0000 [IU]/h | INTRAMUSCULAR | Status: DC
  Administered 2015-09-11 (×2): 1400 [IU]/h via INTRAVENOUS
  Filled 2015-09-11 (×2): qty 250

## 2015-09-11 MED ORDER — METHYLPHENIDATE HCL 5 MG PO TABS
20.0000 mg | ORAL_TABLET | Freq: Every day | ORAL | Status: DC
  Administered 2015-09-11: 20 mg via ORAL
  Filled 2015-09-11 (×3): qty 4

## 2015-09-11 MED ORDER — MORPHINE SULFATE (PF) 2 MG/ML IV SOLN: 2.0000 mg | INTRAVENOUS | Status: DC | PRN

## 2015-09-11 MED ORDER — OXYCODONE HCL 5 MG PO TABS
10.0000 mg | ORAL_TABLET | Freq: Four times a day (QID) | ORAL | Status: DC | PRN
  Administered 2015-09-11 – 2015-09-13 (×8): 10 mg via ORAL
  Filled 2015-09-11 (×8): qty 2

## 2015-09-11 MED ORDER — SODIUM CHLORIDE 0.9% FLUSH: 3.0000 mL | INTRAVENOUS | Status: DC | PRN

## 2015-09-11 MED ORDER — METOPROLOL TARTRATE 25 MG PO TABS
25.0000 mg | ORAL_TABLET | Freq: Two times a day (BID) | ORAL | Status: DC
  Administered 2015-09-11 – 2015-09-12 (×3): 25 mg via ORAL
  Filled 2015-09-11 (×4): qty 1

## 2015-09-11 MED ORDER — AZITHROMYCIN 250 MG PO TABS
500.0000 mg | ORAL_TABLET | Freq: Every day | ORAL | Status: DC
  Administered 2015-09-11: 500 mg via ORAL
  Filled 2015-09-11: qty 2

## 2015-09-11 MED ORDER — SODIUM CHLORIDE 0.9 % WEIGHT BASED INFUSION
3.0000 mL/kg/h | INTRAVENOUS | Status: DC
  Administered 2015-09-12: 3 mL/kg/h via INTRAVENOUS

## 2015-09-11 MED ORDER — LOSARTAN POTASSIUM-HCTZ 100-12.5 MG PO TABS: 1.0000 | ORAL_TABLET | Freq: Every day | ORAL | Status: DC

## 2015-09-11 MED ORDER — ENOXAPARIN SODIUM 40 MG/0.4ML ~~LOC~~ SOLN: 40.0000 mg | SUBCUTANEOUS | Status: DC

## 2015-09-11 MED ORDER — HYDROCHLOROTHIAZIDE 12.5 MG PO CAPS
12.5000 mg | ORAL_CAPSULE | Freq: Every day | ORAL | Status: DC
  Administered 2015-09-11: 12.5 mg via ORAL
  Filled 2015-09-11: qty 1

## 2015-09-11 MED ORDER — HEPARIN (PORCINE) IN NACL 100-0.45 UNIT/ML-% IJ SOLN
1550.0000 [IU]/h | INTRAMUSCULAR | Status: DC
  Administered 2015-09-12: 1550 [IU]/h via INTRAVENOUS
  Filled 2015-09-11: qty 250

## 2015-09-11 NOTE — Progress Notes (Signed)
ANTICOAGULATION CONSULT NOTE - Initial Consult  Pharmacy Consult for heparin Indication: chest pain/ACS  Allergies  Allergen Reactions  . Crestor [Rosuvastatin Calcium] Other (See Comments)    Joint pain  . Metadate Cd [Methylphenidate Hcl Er (Cd)] Other (See Comments)    Burning, GI upset, bloating  . Morphine And Related Nausea And Vomiting  . Hydrocodone Nausea Only    Can take with food    Patient Measurements: Height: 6' (182.9 cm) Weight: 225 lb 12.8 oz (102.422 kg) IBW/kg (Calculated) : 77.6 Heparin Dosing Weight: 100kg  Vital Signs: Temp: 98.4 F (36.9 C) (04/23 0014) Temp Source: Oral (04/23 0014) BP: 119/78 mmHg (04/23 0014) Pulse Rate: 83 (04/22 2345)  Labs:  Recent Labs  09/10/15 2025 09/11/15 0040  HGB 12.5* 12.6*  HCT 37.0* 37.9*  PLT 221 245  CREATININE 1.05 1.09  TROPONINI  --  0.39*    Estimated Creatinine Clearance: 87 mL/min (by C-G formula based on Cr of 1.09).   Medical History: Past Medical History  Diagnosis Date  . Hyperlipidemia   . Hypertension   . GERD (gastroesophageal reflux disease)   . Osteopenia   . Internal hemorrhoids   . Adenomatous polyps   . Low testosterone   . Vitamin D deficiency   . Chronic back pain   . TIA (transient ischemic attack) 2008  . Shortness of breath dyspnea   . Bronchitis   . Ventral hernia   . Spondylosis     Medications:  Prescriptions prior to admission  Medication Sig Dispense Refill Last Dose  . aspirin 325 MG tablet Take 325 mg by mouth daily.   09/09/2015 at Unknown time  . citalopram (CELEXA) 20 MG tablet Take 20 mg by mouth daily.  6 09/09/2015 at Unknown time  . gabapentin (NEURONTIN) 300 MG capsule Take 300 mg by mouth at bedtime.   09/09/2015 at Unknown time  . losartan-hydrochlorothiazide (HYZAAR) 100-12.5 MG tablet TAKE 1 TABLET BY MOUTH DAILY. 30 tablet 3 09/10/2015 at Unknown time  . methylphenidate (RITALIN) 20 MG tablet Take 20 mg by mouth daily. Reported on 09/10/2015  0  09/10/2015 at Unknown time  . Multiple Vitamin (MULTIVITAMIN) tablet Take 1 tablet by mouth daily.   09/10/2015 at Unknown time  . omeprazole (PRILOSEC) 20 MG capsule Take 20 mg by mouth daily.     2 months at Unknown time  . oxyCODONE-acetaminophen (PERCOCET) 10-325 MG tablet Take 2 tablets by mouth every 6 (six) hours as needed for pain. Patient gets #180 pills monthly from Dr. Carloyn Manner, last filled February 6.   09/10/2015 at 1300   Scheduled:  . aspirin  325 mg Oral Daily  . citalopram  20 mg Oral Daily  . gabapentin  300 mg Oral QHS  . losartan  100 mg Oral Daily   And  . hydrochlorothiazide  12.5 mg Oral Daily  . methylphenidate  20 mg Oral Daily  . pantoprazole  40 mg Oral Daily    Assessment: 65yo male c/o sudden onset of mid CP radiating to the mid back, i-stat troponin negative but next returned elevated, to begin heparin.  Goal of Therapy:  Heparin level 0.3-0.7 units/ml Monitor platelets by anticoagulation protocol: Yes   Plan:  Will give heparin 4000 units IV bolus x1 followed by gtt at 1400 units/hr and monitor heparin levels and CBC.  Wynona Neat, PharmD, BCPS  09/11/2015,2:14 AM

## 2015-09-11 NOTE — Progress Notes (Signed)
ANTICOAGULATION CONSULT NOTE - Initial Consult  Pharmacy Consult for heparin Indication: chest pain/ACS  Allergies  Allergen Reactions  . Crestor [Rosuvastatin Calcium] Other (See Comments)    Joint pain  . Metadate Cd [Methylphenidate Hcl Er (Cd)] Other (See Comments)    Burning, GI upset, bloating  . Morphine And Related Nausea And Vomiting  . Hydrocodone Nausea Only    Can take with food    Patient Measurements: Height: 6' (182.9 cm) Weight: 225 lb 12.8 oz (102.422 kg) IBW/kg (Calculated) : 77.6 Heparin Dosing Weight: 100kg  Vital Signs: Temp: 98.2 F (36.8 C) (04/23 1700) Temp Source: Oral (04/23 1700) BP: 119/80 mmHg (04/23 1700) Pulse Rate: 68 (04/23 1700)  Labs:  Recent Labs  09/10/15 2025 09/11/15 0040 09/11/15 1125 09/11/15 1745  HGB 12.5* 12.6*  --   --   HCT 37.0* 37.9*  --   --   PLT 221 245  --   --   HEPARINUNFRC  --   --  0.31 0.26*  CREATININE 1.05 1.09  --   --   TROPONINI  --  0.39* 0.35*  --     Estimated Creatinine Clearance: 87 mL/min (by C-G formula based on Cr of 1.09).   Medical History: Past Medical History  Diagnosis Date  . Hyperlipidemia   . Hypertension   . GERD (gastroesophageal reflux disease)   . Osteopenia   . Internal hemorrhoids   . Adenomatous polyps   . Low testosterone   . Vitamin D deficiency   . Chronic back pain   . TIA (transient ischemic attack) 2008  . Shortness of breath dyspnea   . Bronchitis   . Ventral hernia   . Spondylosis     Medications:  Prescriptions prior to admission  Medication Sig Dispense Refill Last Dose  . aspirin 325 MG tablet Take 325 mg by mouth daily.   09/09/2015 at Unknown time  . citalopram (CELEXA) 20 MG tablet Take 20 mg by mouth daily.  6 09/09/2015 at Unknown time  . gabapentin (NEURONTIN) 300 MG capsule Take 300 mg by mouth at bedtime.   09/09/2015 at Unknown time  . losartan-hydrochlorothiazide (HYZAAR) 100-12.5 MG tablet TAKE 1 TABLET BY MOUTH DAILY. 30 tablet 3 09/10/2015  at Unknown time  . methylphenidate (RITALIN) 20 MG tablet Take 20 mg by mouth daily. Reported on 09/10/2015  0 09/10/2015 at Unknown time  . Multiple Vitamin (MULTIVITAMIN) tablet Take 1 tablet by mouth daily.   09/10/2015 at Unknown time  . omeprazole (PRILOSEC) 20 MG capsule Take 20 mg by mouth daily.     2 months at Unknown time  . oxyCODONE-acetaminophen (PERCOCET) 10-325 MG tablet Take 2 tablets by mouth every 6 (six) hours as needed for pain. Patient gets #180 pills monthly from Dr. Carloyn Manner, last filled February 6.   09/10/2015 at 1300   Scheduled:  . [START ON 09/12/2015] aspirin  81 mg Oral Pre-Cath  . aspirin  325 mg Oral Daily  . atorvastatin  40 mg Oral q1800  . cefTRIAXone (ROCEPHIN)  IV  1 g Intravenous Q0600  . citalopram  20 mg Oral Daily  . gabapentin  300 mg Oral QHS  . losartan  100 mg Oral Daily  . methylphenidate  20 mg Oral Daily  . metoprolol tartrate  25 mg Oral BID  . pantoprazole  40 mg Oral Daily  . sodium chloride flush  3 mL Intravenous Q12H    Assessment: 66 yo male admitted 09/10/2015 for NSTEMI with sudden onset of  CP radiating to mid back. Pharmacy is consulted to dose heparin.   PMH HLD, HTN, GERD, TIA  HL subtherapeutic at 0.26 this evening on heparin 1400 units/h. CBC this AM stable. No issues with line or pauses in therapy per RN. No noted bleeding.   Goal of Therapy:  Heparin level 0.3-0.7 units/ml Monitor platelets by anticoagulation protocol: Yes   Plan:  - Increase heparin to 1500 units/hr - AM HL - Daily HL, CBC - Monitor s/x bleeding - LHC in AM   [facility], [place].D., BCPS PGY2 Cardiology Pharmacy Resident Pager: [phone]  09/11/2015 7:11 PM

## 2015-09-11 NOTE — Progress Notes (Signed)
ANTICOAGULATION CONSULT NOTE - Initial Consult  Pharmacy Consult for heparin Indication: chest pain/ACS  Allergies  Allergen Reactions  . Crestor [Rosuvastatin Calcium] Other (See Comments)    Joint pain  . Metadate Cd [Methylphenidate Hcl Er (Cd)] Other (See Comments)    Burning, GI upset, bloating  . Morphine And Related Nausea And Vomiting  . Hydrocodone Nausea Only    Can take with food    Patient Measurements: Height: 6' (182.9 cm) Weight: 225 lb 12.8 oz (102.422 kg) IBW/kg (Calculated) : 77.6 Heparin Dosing Weight: 100kg  Vital Signs: Temp: 98.8 F (37.1 C) (04/23 0900) Temp Source: Oral (04/23 0900) BP: 114/82 mmHg (04/23 1210) Pulse Rate: 94 (04/23 1210)  Labs:  Recent Labs  09/10/15 2025 09/11/15 0040 09/11/15 1125  HGB 12.5* 12.6*  --   HCT 37.0* 37.9*  --   PLT 221 245  --   HEPARINUNFRC  --   --  0.31  CREATININE 1.05 1.09  --   TROPONINI  --  0.39* 0.35*    Estimated Creatinine Clearance: 87 mL/min (by C-G formula based on Cr of 1.09).   Medical History: Past Medical History  Diagnosis Date  . Hyperlipidemia   . Hypertension   . GERD (gastroesophageal reflux disease)   . Osteopenia   . Internal hemorrhoids   . Adenomatous polyps   . Low testosterone   . Vitamin D deficiency   . Chronic back pain   . TIA (transient ischemic attack) 2008  . Shortness of breath dyspnea   . Bronchitis   . Ventral hernia   . Spondylosis     Medications:  Prescriptions prior to admission  Medication Sig Dispense Refill Last Dose  . aspirin 325 MG tablet Take 325 mg by mouth daily.   09/09/2015 at Unknown time  . citalopram (CELEXA) 20 MG tablet Take 20 mg by mouth daily.  6 09/09/2015 at Unknown time  . gabapentin (NEURONTIN) 300 MG capsule Take 300 mg by mouth at bedtime.   09/09/2015 at Unknown time  . losartan-hydrochlorothiazide (HYZAAR) 100-12.5 MG tablet TAKE 1 TABLET BY MOUTH DAILY. 30 tablet 3 09/10/2015 at Unknown time  . methylphenidate (RITALIN)  20 MG tablet Take 20 mg by mouth daily. Reported on 09/10/2015  0 09/10/2015 at Unknown time  . Multiple Vitamin (MULTIVITAMIN) tablet Take 1 tablet by mouth daily.   09/10/2015 at Unknown time  . omeprazole (PRILOSEC) 20 MG capsule Take 20 mg by mouth daily.     2 months at Unknown time  . oxyCODONE-acetaminophen (PERCOCET) 10-325 MG tablet Take 2 tablets by mouth every 6 (six) hours as needed for pain. Patient gets #180 pills monthly from Dr. Carloyn Manner, last filled February 6.   09/10/2015 at 1300   Scheduled:  . aspirin  325 mg Oral Daily  . atorvastatin  40 mg Oral q1800  . azithromycin  500 mg Oral Q0600  . cefTRIAXone (ROCEPHIN)  IV  1 g Intravenous Q0600  . citalopram  20 mg Oral Daily  . gabapentin  300 mg Oral QHS  . losartan  100 mg Oral Daily   And  . hydrochlorothiazide  12.5 mg Oral Daily  . methylphenidate  20 mg Oral Daily  . pantoprazole  40 mg Oral Daily    Assessment: 65 yo male c/o sudden onset of mid CP radiating to the mid back. Pharmacy is consulted to dose heparin for a NSTEMI. Initial HL this AM was therapeutic at 0.31 on 1400 units/hr. CBC stable.   Goal of  Therapy:  Heparin level 0.3-0.7 units/ml Monitor platelets by anticoagulation protocol: Yes   Plan:  - Continue heparin infusion at 1400 units/hr - 6-hr confirmatory HL @ 1730 - Daily HL, CBC - Monitor for bleeding  Cassie L. Nicole Kindred, PharmD PGY2 Infectious Diseases Pharmacy Resident Pager: [phone] 09/11/2015 12:33 PM

## 2015-09-11 NOTE — Progress Notes (Signed)
C/o CP 3-4/10.  VSS. SL Ntg x2 given.  Pt states it does not really help the CP, only gives him a "massive headache".  Pt requesting pain meds (Percocet and Oxy IR) for the HA.  EKG obtained.  [place], [place] notified.  Will continue to monitor closely.  Pt for heart cath in am.

## 2015-09-11 NOTE — Consult Note (Signed)
Patient ID: FUMIO BERARD MRN: [id number], DOB/AGE: [birth year]   Admit date: 09/10/2015   Primary Physician: [name], MD Primary Cardiologist: Dr. [name]  Pt. Profile:  65 y/o male with h/o HTN, HLD, ? Prior TIA and GERD, but no known h/o CAD, admitted for CP with + troponin.   Problem List  Past Medical History  Diagnosis Date  . Hyperlipidemia   . Hypertension   . GERD (gastroesophageal reflux disease)   . Osteopenia   . Internal hemorrhoids   . Adenomatous polyps   . Low testosterone   . Vitamin D deficiency   . Chronic back pain   . TIA (transient ischemic attack) 2008  . Shortness of breath dyspnea   . Bronchitis   . Ventral hernia   . Spondylosis     Past Surgical History  Procedure Laterality Date  . Back surgery      2 surgeries     Allergies  Allergies  Allergen Reactions  . Crestor [Rosuvastatin Calcium] Other (See Comments)    Joint pain  . Metadate Cd [Methylphenidate Hcl Er (Cd)] Other (See Comments)    Burning, GI upset, bloating  . Morphine And Related Nausea And Vomiting  . Hydrocodone Nausea Only    Can take with food    HPI  65 y/o male with h/o hypertension and hypercholesterolemia. Previous question of a TIA. Dr. [name] performed a transesophageal echocardiogram on him in March of 2009 there was no PFO. He was then seen by Dr. 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 He has no known h/o CAD.   He presented to [facility] 09/10/15 with a complaint of CP, described as substernal chest pressure and associated with dyspnea, diaphoresis, n/v. He has had some improvement with NTG. EKG shows NSR. Initial troponin is abnormal at 0.39. He is on IV heparin.   Home Medications  Prior to Admission medications   Medication Sig Start Date End Date Taking? Authorizing Provider  aspirin 325 MG tablet Take 325 mg by mouth daily.   Yes Historical Provider, MD  citalopram (CELEXA) 20 MG tablet Take 20 mg by mouth daily. 08/30/15   Yes Historical Provider, MD  gabapentin (NEURONTIN) 300 MG capsule Take 300 mg by mouth at bedtime.   Yes Historical Provider, MD  losartan-hydrochlorothiazide (HYZAAR) 100-12.5 MG tablet TAKE 1 TABLET BY MOUTH DAILY. 05/30/15  Yes [name], MD  methylphenidate (RITALIN) 20 MG tablet Take 20 mg by mouth daily. Reported on 09/10/2015 06/15/15  Yes Historical Provider, MD  Multiple Vitamin (MULTIVITAMIN) tablet Take 1 tablet by mouth daily.   Yes Historical Provider, MD  omeprazole (PRILOSEC) 20 MG capsule Take 20 mg by mouth daily.     Yes Historical Provider, MD  oxyCODONE-acetaminophen (PERCOCET) 10-325 MG tablet Take 2 tablets by mouth every 6 (six) hours as needed for pain. Patient gets #180 pills monthly from Dr. Carloyn Manner, last filled February 6.   Yes Historical Provider, MD    Family History  Family History  Problem Relation Age of Onset  . Heart disease Mother   . Stroke Mother   . Heart attack Mother   . Emphysema Father     Social History  Social History   Social History  . Marital Status: Married    Spouse Name: N/A  . Number of Children: 4  . Years of Education: 14   Occupational History  . Transfer driver    Social History Main Topics  . Smoking status:  Former Smoker -- 10 years    Types: Cigarettes    Quit date: 01/04/1978  . Smokeless tobacco: Never Used  . Alcohol Use: No     Comment: rarely  . Drug Use: No  . Sexual Activity: Not on file   Other Topics Concern  . Not on file   Social History Narrative   Lives at home with his wife.   Right-handed.   2 cups caffeine per day.     Review of Systems General:  No chills, fever, night sweats or weight changes.  Cardiovascular:  No chest pain, dyspnea on exertion, edema, orthopnea, palpitations, paroxysmal nocturnal dyspnea. Dermatological: No rash, lesions/masses Respiratory: No cough, dyspnea Urologic: No hematuria, dysuria Abdominal:   No nausea, vomiting, diarrhea, bright red blood per rectum,  melena, or hematemesis Neurologic:  No visual changes, wkns, changes in mental status. All other systems reviewed and are otherwise negative except as noted above.  Physical Exam  Blood pressure 122/85, pulse 83, temperature 98.3 F (36.8 C), temperature source Oral, resp. rate 18, height 6' (1.829 m), weight 225 lb 12.8 oz (102.422 kg), SpO2 99 %.  General: Pleasant, NAD Psych: Normal affect. Neuro: Alert and oriented X 3. Moves all extremities spontaneously. HEENT: Normal  Neck: Supple without bruits or JVD. Lungs:  Resp regular and unlabored, CTA. Heart: RRR no s3, s4, or murmurs. Abdomen: Soft, non-tender, non-distended, BS + x 4.  Extremities: No clubbing, cyanosis or edema. DP/PT/Radials 2+ and equal bilaterally.  Labs  Troponin [facility] Test)  Recent Labs  09/10/15 2034  TROPIPOC 0.01    Recent Labs  09/11/15 0040  TROPONINI 0.39*   Lab Results  Component Value Date   WBC 11.6* 09/11/2015   HGB 12.6* 09/11/2015   HCT 37.9* 09/11/2015   MCV 95.7 09/11/2015   PLT 245 09/11/2015    Recent Labs Lab 09/11/15 0040  NA 139  K 3.8  CL 103  CO2 24  BUN 11  CREATININE 1.09  CALCIUM 10.1  PROT 7.2  BILITOT 1.1  ALKPHOS 70  ALT 36  AST 31  GLUCOSE 123*   Lab Results  Component Value Date   CHOL 216* 06/23/2015   HDL 44 06/23/2015   LDLCALC 111 06/23/2015   TRIG 307* 06/23/2015   Lab Results  Component Value Date   DDIMER 0.31 09/11/2015     Radiology/Studies  Dg Chest 2 View  09/10/2015  CLINICAL DATA:  Sudden onset of mid chest pain radiating to the back. Shortness of breath. Cough for several months. EXAM: CHEST  2 VIEW COMPARISON:  Chest radiographs 07/12/2008 FINDINGS: The cardiomediastinal contours are normal. Linear opacities in both lung bases consistent with atelectasis. Ill-defined right midlung opacity, not characterized on the lateral view. Pulmonary vasculature is normal. No pleural effusion or pneumothorax. No acute osseous  abnormalities are seen. IMPRESSION: Linear bibasilar atelectasis. Ill-defined opacity in the right midlung zone may reflect an additional site of atelectasis, versus infectious or inflammatory pneumonitis. Electronically Signed   By: Jeb Levering M.D.   On: 09/10/2015 20:35    ECG  NSR. No ischemia.   ASSESSMENT AND PLAN  Principal Problem:   Chest pain Active Problems:   HYPERCHOLESTEROLEMIA   Essential hypertension   GE reflux   Chronic back pain   1. Chest Pain with + Troponin: initial troponin is elevated at 0.39. We will continue to cycle x 3 to assess trend. If 2nd and 3rd troponin are also positive, will plan for [facility] on Monday.  Continue IV heparin, ASA and statin. Renal function is normal, however if cath is decided for tomorrow, will hold HCTZ and losartan to reduce renal strain. Consider adding low dose BB.   2. HTN: BP is well controlled on current regimen. Given likely hood for concomitant CAD, patient would benefit from addition of BB if HR allows.   3. HLD: recent LP 06/2015 showed elevated LDL at 111. TG also elevated at 307. Lipitor 40 recently started. Recommend increasing to 80 given concerns for ACS. Recheck FLP and HFTs in 6 weeks.    Signed, SIMMONS, Silas Flood, PA-C 09/11/2015, 8:23 AM  I have seen and examined the patient along with SIMMONS, BRITTAINY, PA-C.  I have reviewed the chart, notes and new data.  I agree with PA's note.  Key new complaints: describes typical exertional angina (mowing lawn, relieved by rest) for a fe weeks, then chest pressure at rest x 2 prior to admission c/w unstable angina. No pain free. Key examination changes: normal CV exam Key new findings / data: ECG unrevealing, but mildly abnormal troponin c/w tiny NSTEMI  PLAN: Coronary angiography and possible revascularization in AM. This procedure has been fully reviewed with the patient and written informed consent has been obtained. Beta blockers in addition to  ASA/heparin/statin. Risk factor modification.  Sanda Klein, MD, [place] [phone] 09/11/2015, 9:46 AM

## 2015-09-11 NOTE — Progress Notes (Signed)
PROGRESS NOTE        PATIENT DETAILS Name: Johnny [name] Age: 65 y.o. Sex: male Date of Birth: 1951-02-07 Admit Date: 09/10/2015 Admitting Physician Lavina Hamman, MD WW:[id number] J, MD Outpatient Specialists:None  Brief Narrative: Patient is a 65 y.o. male h/o HTN, HLD, ? Prior TIA and GERD, but no known h/o CAD, admitted with chest pain-found to have NSTEMI.   Subjective: Minimal chest pain.   Assessment/Plan: Principal Problem: NSTEMI: continue IV Heparin,ASA and statin, add low dose Beta blocker, Cards consulted-plans are for [facility] tomorrow.   Active Problems: ?Bronchitis:doubt needs antibiotics-stop zithromax. Lungs are completely clear on exam.  HTN: Controlled.Will discontinue HCTZ, start Metoprolol. Continue Losartan.   Dyslipidemia:continue Statin  GERD: PPI  Chronic back pain:Continue Percocet and Neurontin   DVT Prophylaxis: Full dose anticoagulation with Heparin  Code Status: Full code   Family Communication: None at bedside  Disposition Plan: Remain inpatient  Antimicrobial agents: None  Procedures: None  CONSULTS:  cardiology  Time spent: 20 minutes-Greater than 50% of this time was spent in counseling, explanation of diagnosis, planning of further management, and coordination of care.  MEDICATIONS: Anti-infectives    Start     Dose/Rate Route Frequency Ordered Stop   09/11/15 0430  cefTRIAXone (ROCEPHIN) 1 g in dextrose 5 % 50 mL IVPB     1 g 100 mL/hr over 30 Minutes Intravenous Daily 09/11/15 0358 09/18/15 0559   09/11/15 0430  azithromycin (ZITHROMAX) tablet 500 mg     500 mg Oral Daily 09/11/15 0358 09/18/15 0559      Scheduled Meds: . aspirin  325 mg Oral Daily  . atorvastatin  40 mg Oral q1800  . azithromycin  500 mg Oral Q0600  . cefTRIAXone (ROCEPHIN)  IV  1 g Intravenous Q0600  . citalopram  20 mg Oral Daily  . gabapentin  300 mg Oral QHS  . losartan  100 mg Oral Daily   And  .  hydrochlorothiazide  12.5 mg Oral Daily  . methylphenidate  20 mg Oral Daily  . pantoprazole  40 mg Oral Daily   Continuous Infusions: . heparin 1,400 Units/hr (09/11/15 0319)   PRN Meds:.acetaminophen, gi cocktail, morphine injection, nitroGLYCERIN, ondansetron (ZOFRAN) IV, oxyCODONE-acetaminophen **AND** oxyCODONE   PHYSICAL EXAM: Vital signs: Filed Vitals:   09/11/15 1053 09/11/15 1150 09/11/15 1200 09/11/15 1210  BP: 147/83 152/96 135/79 114/82  Pulse: 72 76 88 94  Temp:    98.5 F (36.9 C)  TempSrc:    Oral  Resp:    16  Height:      Weight:      SpO2:    100%   Filed Weights   09/10/15 1958 09/11/15 0014  Weight: 104.327 kg (230 lb) 102.422 kg (225 lb 12.8 oz)   Body mass index is 30.62 kg/(m^2).   Gen Exam: Awake and alert with clear speech. Not in any distress  Neck: Supple, No JVD.   Chest: B/L Clear.   CVS: S1 S2 Regular, no murmurs.  Abdomen: soft, BS +, non tender, non distended.  Extremities: no edema, lower extremities warm to touch. Neurologic: Non Focal.   Skin: No Rash or lesions   Wounds: N/A.   LABORATORY DATA: CBC:  Recent Labs Lab 09/10/15 2025 09/11/15 0040  WBC 12.8* 11.6*  NEUTROABS  --  7.7  HGB 12.5* 12.6*  HCT 37.0* 37.9*  MCV 94.6 95.7  PLT 221 [id number]    Basic Metabolic Panel:  Recent Labs Lab 09/10/15 2025 09/11/15 0040  NA 136 139  K 3.7 3.8  CL 103 103  CO2 22 24  GLUCOSE 95 123*  BUN 10 11  CREATININE 1.05 1.09  CALCIUM 9.7 10.1    GFR: Estimated Creatinine Clearance: 87 mL/min (by C-G formula based on Cr of 1.09).  Liver Function Tests:  Recent Labs Lab 09/11/15 0040  AST 31  ALT 36  ALKPHOS 70  BILITOT 1.1  PROT 7.2  ALBUMIN 3.6   No results for input(s): LIPASE, AMYLASE in the last 168 hours. No results for input(s): AMMONIA in the last 168 hours.  Coagulation Profile: No results for input(s): INR, PROTIME in the last 168 hours.  Cardiac Enzymes:  Recent Labs Lab 09/11/15 0040  09/11/15 1125  TROPONINI 0.39* 0.35*    BNP (last 3 results) No results for input(s): PROBNP in the last 8760 hours.  HbA1C: No results for input(s): HGBA1C in the last 72 hours.  CBG: No results for input(s): GLUCAP in the last 168 hours.  Lipid Profile: No results for input(s): CHOL, HDL, LDLCALC, TRIG, CHOLHDL, LDLDIRECT in the last 72 hours.  Thyroid Function Tests: No results for input(s): TSH, T4TOTAL, FREET4, T3FREE, THYROIDAB in the last 72 hours.  Anemia Panel: No results for input(s): VITAMINB12, FOLATE, FERRITIN, TIBC, IRON, RETICCTPCT in the last 72 hours.  Urine analysis:    Component Value Date/Time   COLORURINE YELLOW 07/13/2015 [place] 07/13/2015 0644   LABSPEC 1.016 07/13/2015 0644   PHURINE 7.0 07/13/2015 0644   GLUCOSEU NEGATIVE 07/13/2015 0644   HGBUR NEGATIVE 07/13/2015 0644   BILIRUBINUR NEGATIVE 07/13/2015 0644   BILIRUBINUR neg 06/24/2015 1508   KETONESUR NEGATIVE 07/13/2015 0644   PROTEINUR NEGATIVE 07/13/2015 0644   PROTEINUR neg 06/24/2015 1508   UROBILINOGEN 0.2 06/24/2015 1508   UROBILINOGEN 0.2 10/26/2014 0337   NITRITE NEGATIVE 07/13/2015 0644   NITRITE neg 06/24/2015 1508   LEUKOCYTESUR NEGATIVE 07/13/2015 0644    Sepsis Labs: Lactic Acid, Venous No results found for: LATICACIDVEN  MICROBIOLOGY: Recent Results (from the past 240 hour(s))  MRSA PCR Screening     Status: None   Collection Time: 09/11/15 12:20 AM  Result Value Ref Range Status   MRSA by PCR NEGATIVE NEGATIVE Final    Comment:        The GeneXpert MRSA Assay (FDA approved for NASAL specimens only), is one component of a comprehensive MRSA colonization surveillance program. It is not intended to diagnose MRSA infection nor to guide or monitor treatment for MRSA infections.     RADIOLOGY STUDIES/RESULTS: Dg Chest 2 View  09/10/2015  CLINICAL DATA:  Sudden onset of mid chest pain radiating to the back. Shortness of breath. Cough for  several months. EXAM: CHEST  2 VIEW COMPARISON:  Chest radiographs 07/12/2008 FINDINGS: The cardiomediastinal contours are normal. Linear opacities in both lung bases consistent with atelectasis. Ill-defined right midlung opacity, not characterized on the lateral view. Pulmonary vasculature is normal. No pleural effusion or pneumothorax. No acute osseous abnormalities are seen. IMPRESSION: Linear bibasilar atelectasis. Ill-defined opacity in the right midlung zone may reflect an additional site of atelectasis, versus infectious or inflammatory pneumonitis. Electronically Signed   By: Jeb Levering M.D.   On: 09/10/2015 20:35   Dg Ribs Bilateral  09/11/2015  CLINICAL DATA:  Pt. [place] 08[place]27/2015 at a bowling alley with his left side hitting the wooden floor. Most  pain left anterior to axillary below diaphragm ribs. Some pain yesterday on right anterior below diaphragm ribs. EXAM: BILATERAL RIBS - 3+ VIEW COMPARISON:  09/10/2015 FINDINGS: No displaced rib fractures on the LEFT or RIGHT.  No pneumothorax. IMPRESSION: No rib fractures or pneumothorax. Electronically Signed   By: Suzy Bouchard M.D.   On: 09/11/2015 10:39     Oren Binet, MD  [facility] Pager:[phone]  If 7PM-7AM, please contact night-coverage [email or web address] Password [email or web address] 09/11/2015, 1:20 PM

## 2015-09-12 ENCOUNTER — Encounter (HOSPITAL_COMMUNITY)
Admission: EM | Disposition: A | Discharge status: Home / Self Care | Payer: Self-pay | Source: Home / Self Care | Attending: Internal Medicine

## 2015-09-12 ENCOUNTER — Encounter (HOSPITAL_COMMUNITY): Payer: Self-pay | Admitting: Cardiovascular Disease

## 2015-09-12 ENCOUNTER — Observation Stay (HOSPITAL_BASED_OUTPATIENT_CLINIC_OR_DEPARTMENT_OTHER): Payer: 59

## 2015-09-12 DIAGNOSIS — G8929 Other chronic pain: Secondary | ICD-10-CM

## 2015-09-12 DIAGNOSIS — I214 Non-ST elevation (NSTEMI) myocardial infarction: Secondary | ICD-10-CM | POA: Diagnosis present

## 2015-09-12 DIAGNOSIS — M549 Dorsalgia, unspecified: Secondary | ICD-10-CM

## 2015-09-12 DIAGNOSIS — Z885 Allergy status to narcotic agent status: Secondary | ICD-10-CM | POA: Diagnosis not present

## 2015-09-12 DIAGNOSIS — I10 Essential (primary) hypertension: Secondary | ICD-10-CM | POA: Diagnosis present

## 2015-09-12 DIAGNOSIS — Z888 Allergy status to other drugs, medicaments and biological substances status: Secondary | ICD-10-CM | POA: Diagnosis not present

## 2015-09-12 DIAGNOSIS — Z79899 Other long term (current) drug therapy: Secondary | ICD-10-CM | POA: Diagnosis not present

## 2015-09-12 DIAGNOSIS — E785 Hyperlipidemia, unspecified: Secondary | ICD-10-CM | POA: Diagnosis present

## 2015-09-12 DIAGNOSIS — Z8673 Personal history of transient ischemic attack (TIA), and cerebral infarction without residual deficits: Secondary | ICD-10-CM | POA: Diagnosis not present

## 2015-09-12 DIAGNOSIS — Z7982 Long term (current) use of aspirin: Secondary | ICD-10-CM | POA: Diagnosis not present

## 2015-09-12 DIAGNOSIS — J189 Pneumonia, unspecified organism: Secondary | ICD-10-CM | POA: Diagnosis present

## 2015-09-12 DIAGNOSIS — E559 Vitamin D deficiency, unspecified: Secondary | ICD-10-CM | POA: Diagnosis present

## 2015-09-12 DIAGNOSIS — E7800 Pure hypercholesterolemia, unspecified: Secondary | ICD-10-CM | POA: Diagnosis present

## 2015-09-12 DIAGNOSIS — R079 Chest pain, unspecified: Secondary | ICD-10-CM

## 2015-09-12 DIAGNOSIS — K219 Gastro-esophageal reflux disease without esophagitis: Secondary | ICD-10-CM | POA: Diagnosis present

## 2015-09-12 DIAGNOSIS — Z87891 Personal history of nicotine dependence: Secondary | ICD-10-CM | POA: Diagnosis not present

## 2015-09-12 DIAGNOSIS — I209 Angina pectoris, unspecified: Secondary | ICD-10-CM | POA: Diagnosis not present

## 2015-09-12 DIAGNOSIS — I25119 Atherosclerotic heart disease of native coronary artery with unspecified angina pectoris: Secondary | ICD-10-CM

## 2015-09-12 DIAGNOSIS — I25110 Atherosclerotic heart disease of native coronary artery with unstable angina pectoris: Secondary | ICD-10-CM | POA: Diagnosis present

## 2015-09-12 HISTORY — PX: CARDIAC CATHETERIZATION: SHX172

## 2015-09-12 LAB — BASIC METABOLIC PANEL
ANION GAP: 13 (ref 5–15)
BUN: 12 mg/dL (ref 6–20)
CHLORIDE: 103 mmol/L (ref 101–111)
CO2: 24 mmol/L (ref 22–32)
Calcium: 10.1 mg/dL (ref 8.9–10.3)
Creatinine, Ser: 1.07 mg/dL (ref 0.61–1.24)
GFR calc Af Amer: >60 mL/min (ref >60)
GFR calc non Af Amer: >60 mL/min (ref >60)
Glucose, Bld: 100 mg/dL — ABNORMAL HIGH (ref 65–99)
POTASSIUM: 4.5 mmol/L (ref 3.5–5.1)
SODIUM: 140 mmol/L (ref 135–145)

## 2015-09-12 LAB — ECHOCARDIOGRAM COMPLETE
HEIGHTINCHES: 72 in
Weight: 3539.2 oz

## 2015-09-12 LAB — PROTIME-INR
INR: 1.06 (ref 0.00–1.49)
PROTHROMBIN TIME: 14 s (ref 11.6–15.2)

## 2015-09-12 LAB — CBC
HCT: 40.3 % (ref 39.0–52.0)
HEMOGLOBIN: 13.5 g/dL (ref 13.0–17.0)
MCH: 32.1 pg (ref 26.0–34.0)
MCHC: 33.5 g/dL (ref 30.0–36.0)
MCV: 95.7 fL (ref 78.0–100.0)
Platelets: 285 10*3/uL (ref 150–400)
RBC: 4.21 MIL/uL — AB (ref 4.22–5.81)
RDW: 12.3 % (ref 11.5–15.5)
WBC: 9 10*3/uL (ref 4.0–10.5)

## 2015-09-12 LAB — EXPECTORATED SPUTUM ASSESSMENT W GRAM STAIN, RFLX TO RESP C

## 2015-09-12 LAB — EXPECTORATED SPUTUM ASSESSMENT W REFEX TO RESP CULTURE

## 2015-09-12 LAB — HEPARIN LEVEL (UNFRACTIONATED): Heparin Unfractionated: 0.42 IU/mL (ref 0.30–0.70)

## 2015-09-12 SURGERY — LEFT HEART CATH AND CORONARY ANGIOGRAPHY
Anesthesia: LOCAL

## 2015-09-12 MED ORDER — FENTANYL CITRATE (PF) 100 MCG/2ML IJ SOLN
INTRAMUSCULAR | Status: AC
  Filled 2015-09-12: qty 2

## 2015-09-12 MED ORDER — IOPAMIDOL (ISOVUE-370) INJECTION 76%
INTRAVENOUS | Status: AC
  Filled 2015-09-12: qty 100

## 2015-09-12 MED ORDER — IOPAMIDOL (ISOVUE-370) INJECTION 76%
INTRAVENOUS | Status: DC | PRN
  Administered 2015-09-12: 120 mL via INTRA_ARTERIAL

## 2015-09-12 MED ORDER — SODIUM CHLORIDE 0.9% FLUSH: 3.0000 mL | INTRAVENOUS | Status: DC | PRN

## 2015-09-12 MED ORDER — HEPARIN (PORCINE) IN NACL 2-0.9 UNIT/ML-% IJ SOLN
INTRAMUSCULAR | Status: AC
  Filled 2015-09-12: qty 1000

## 2015-09-12 MED ORDER — VERAPAMIL HCL 2.5 MG/ML IV SOLN
INTRAVENOUS | Status: AC
  Filled 2015-09-12: qty 2

## 2015-09-12 MED ORDER — HEPARIN SODIUM (PORCINE) 1000 UNIT/ML IJ SOLN
INTRAMUSCULAR | Status: AC
  Filled 2015-09-12: qty 1

## 2015-09-12 MED ORDER — ONDANSETRON HCL 4 MG/2ML IJ SOLN: 4.0000 mg | Freq: Four times a day (QID) | INTRAMUSCULAR | Status: DC | PRN

## 2015-09-12 MED ORDER — LIDOCAINE HCL (PF) 1 % IJ SOLN
INTRAMUSCULAR | Status: AC
  Filled 2015-09-12: qty 30

## 2015-09-12 MED ORDER — SODIUM CHLORIDE 0.9% FLUSH
3.0000 mL | Freq: Two times a day (BID) | INTRAVENOUS | Status: DC
  Administered 2015-09-13: 3 mL via INTRAVENOUS

## 2015-09-12 MED ORDER — ACETAMINOPHEN 325 MG PO TABS
650.0000 mg | ORAL_TABLET | ORAL | Status: DC | PRN
Start: 2015-09-12 — End: 2015-09-12

## 2015-09-12 MED ORDER — MIDAZOLAM HCL 2 MG/2ML IJ SOLN
INTRAMUSCULAR | Status: AC
  Filled 2015-09-12: qty 2

## 2015-09-12 MED ORDER — HEPARIN (PORCINE) IN NACL 2-0.9 UNIT/ML-% IJ SOLN
INTRAMUSCULAR | Status: DC | PRN
  Administered 2015-09-12: 1000 mL

## 2015-09-12 MED ORDER — VERAPAMIL HCL 2.5 MG/ML IV SOLN
INTRA_ARTERIAL | Status: DC | PRN
  Administered 2015-09-12: 5 mL via INTRA_ARTERIAL

## 2015-09-12 MED ORDER — SODIUM CHLORIDE 0.9 % WEIGHT BASED INFUSION: 3.0000 mL/kg/h | INTRAVENOUS | Status: AC

## 2015-09-12 MED ORDER — LIDOCAINE HCL (PF) 1 % IJ SOLN
INTRAMUSCULAR | Status: DC | PRN
  Administered 2015-09-12: 1 mL via INTRADERMAL

## 2015-09-12 MED ORDER — HEPARIN SODIUM (PORCINE) 1000 UNIT/ML IJ SOLN
INTRAMUSCULAR | Status: DC | PRN
  Administered 2015-09-12: 5000 [IU] via INTRAVENOUS

## 2015-09-12 MED ORDER — MIDAZOLAM HCL 2 MG/2ML IJ SOLN
INTRAMUSCULAR | Status: DC | PRN
  Administered 2015-09-12: 1 mg via INTRAVENOUS

## 2015-09-12 MED ORDER — ISOSORBIDE MONONITRATE ER 30 MG PO TB24
30.0000 mg | ORAL_TABLET | Freq: Every day | ORAL | Status: DC
  Administered 2015-09-12: 30 mg via ORAL
  Filled 2015-09-12: qty 1

## 2015-09-12 MED ORDER — FENTANYL CITRATE (PF) 100 MCG/2ML IJ SOLN
INTRAMUSCULAR | Status: DC | PRN
  Administered 2015-09-12: 25 ug via INTRAVENOUS

## 2015-09-12 MED ORDER — ASPIRIN 81 MG PO CHEW: 81.0000 mg | CHEWABLE_TABLET | Freq: Every day | ORAL | Status: DC

## 2015-09-12 MED ORDER — NITROGLYCERIN 1 MG/10 ML FOR IR/CATH LAB
INTRA_ARTERIAL | Status: AC
  Filled 2015-09-12: qty 10

## 2015-09-12 MED ORDER — SODIUM CHLORIDE 0.9 % IV SOLN: 250.0000 mL | INTRAVENOUS | Status: DC | PRN

## 2015-09-12 SURGICAL SUPPLY — 14 items
CATH INFINITI 5 FR JL3.5 (CATHETERS) ×2 IMPLANT
CATH INFINITI 5FR ANG PIGTAIL (CATHETERS) ×2 IMPLANT
CATH INFINITI 5FR JL5 (CATHETERS) ×2 IMPLANT
CATH INFINITI JR4 5F (CATHETERS) ×2 IMPLANT
CATH OPTITORQUE TIG 4.0 5F (CATHETERS) ×2 IMPLANT
DEVICE RAD COMP TR BAND LRG (VASCULAR PRODUCTS) ×2 IMPLANT
GLIDESHEATH SLEND A-KIT 6F 22G (SHEATH) ×4 IMPLANT
KIT HEART LEFT (KITS) ×2 IMPLANT
PACK CARDIAC CATHETERIZATION (CUSTOM PROCEDURE TRAY) ×2 IMPLANT
SYR MEDRAD MARK V 150ML (SYRINGE) ×2 IMPLANT
TRANSDUCER W/STOPCOCK (MISCELLANEOUS) ×2 IMPLANT
TUBING CIL FLEX 10 FLL-RA (TUBING) ×2 IMPLANT
WIRE HI TORQ VERSACORE-J 145CM (WIRE) ×2 IMPLANT
WIRE SAFE-T 1.5MM-J .035X260CM (WIRE) ×2 IMPLANT

## 2015-09-12 NOTE — Progress Notes (Signed)
ANTICOAGULATION CONSULT NOTE - Follow-up Consult  Pharmacy Consult for heparin Indication: chest pain/ACS  Allergies  Allergen Reactions  . Atorvastatin Other (See Comments)    Joint pain  . Crestor [Rosuvastatin Calcium] Other (See Comments)    Joint pain  . Metadate Cd [Methylphenidate Hcl Er (Cd)] Other (See Comments)    Burning, GI upset, bloating  . Morphine And Related Nausea And Vomiting  . Hydrocodone Nausea Only    Can take with food    Patient Measurements: Height: 6' (182.9 cm) Weight: 221 lb 3.2 oz (100.336 kg) IBW/kg (Calculated) : 77.6 Heparin Dosing Weight: 100kg  Vital Signs: Temp: 98.2 F (36.8 C) (04/24 0438) Temp Source: Oral (04/24 0438) BP: 142/86 mmHg (04/24 0438) Pulse Rate: 72 (04/24 0438)  Labs:  Recent Labs  09/10/15 2025 09/11/15 0040 09/11/15 1125 09/11/15 1745 09/12/15 0505  HGB 12.5* 12.6*  --   --  13.5  HCT 37.0* 37.9*  --   --  40.3  PLT 221 245  --   --  285  LABPROT  --   --   --   --  14.0  INR  --   --   --   --  1.06  HEPARINUNFRC  --   --  0.31 0.26* 0.42  CREATININE 1.05 1.09  --   --  1.07  TROPONINI  --  0.39* 0.35*  --   --     Estimated Creatinine Clearance: 87.8 mL/min (by C-G formula based on Cr of 1.07).   Assessment: 65 yo male on heparin for NSTEMI. Heparin level therapeutic on 1500 units/hr. Plan for heart cath today - scheduled for 1300. CBC stable, no bleeding noted.  Goal of Therapy:  Heparin level 0.3-0.7 units/ml Monitor platelets by anticoagulation protocol: Yes   Plan:  - Continue heparin at 1500 units/hr - F/u post cath  Sherlon Handing, PharmD, BCPS Clinical pharmacist, pager [phone] 09/12/2015 5:51 AM

## 2015-09-12 NOTE — Progress Notes (Signed)
Pt not taking statins due to joint pain. Carroll Kinds RN

## 2015-09-12 NOTE — Progress Notes (Signed)
Hospital Problem List     Principal Problem:   Chest pain Active Problems:   HYPERCHOLESTEROLEMIA   Essential hypertension   GE reflux   Chronic back pain   NSTEMI (non-ST elevated myocardial infarction) [facility])    Patient Profile:   Primary Cardiologist: Dr. [name]  65 yo male w/ PMH of HTN, HLD, and GERD admitted on 09/10/2015 for chest pain. Troponin peaked at 0.39. For cardiac cath today.  Subjective   Denies any recurrent substernal chest pain. Reports having back pain throughout the night (chronic). No remaining questions about his procedure today.  Inpatient Medications    . aspirin  325 mg Oral Daily  . atorvastatin  40 mg Oral q1800  . citalopram  20 mg Oral Daily  . gabapentin  300 mg Oral QHS  . losartan  100 mg Oral Daily  . methylphenidate  20 mg Oral Daily  . metoprolol tartrate  25 mg Oral BID  . pantoprazole  40 mg Oral Daily  . sodium chloride flush  3 mL Intravenous Q12H    Vital Signs    Filed Vitals:   09/11/15 2100 09/12/15 0022 09/12/15 0438 09/12/15 0744  BP: 150/88 107/55 142/86 109/74  Pulse: 70 63 72 77  Temp: 98.2 F (36.8 C) 98.5 F (36.9 C) 98.2 F (36.8 C) 98.2 F (36.8 C)  TempSrc: Oral Oral Oral Oral  Resp:    18  Height:      Weight:   221 lb 3.2 oz (100.336 kg)   SpO2: 100% 97% 100% 96%    Intake/Output Summary (Last 24 hours) at 09/12/15 0747 Last data filed at 09/12/15 0700  Gross per 24 hour  Intake 1100.63 ml  Output   1750 ml  Net -649.37 ml   Filed Weights   09/10/15 1958 09/11/15 0014 09/12/15 0438  Weight: 230 lb (104.327 kg) 225 lb 12.8 oz (102.422 kg) 221 lb 3.2 oz (100.336 kg)    Physical Exam    General: Well developed, well nourished, male appearing in no acute distress. Head: Normocephalic, atraumatic.  Neck: Supple without bruits, JVD not elevated. Lungs:  Resp regular and unlabored, CTA without wheezing or rales. Heart: RRR, S1, S2, no S3, S4, or murmur; no rub. Abdomen: Soft, non-tender,  non-distended with normoactive bowel sounds. No hepatomegaly. No rebound/guarding. No obvious abdominal masses. Extremities: No clubbing, cyanosis, or edema. Distal pedal pulses are 2+ bilaterally. Neuro: Alert and oriented X 3. Moves all extremities spontaneously. Psych: Normal affect.  Labs    CBC  Recent Labs  09/11/15 0040 09/12/15 0505  WBC 11.6* 9.0  NEUTROABS 7.7  --   HGB 12.6* 13.5  HCT 37.9* 40.3  MCV 95.7 95.7  PLT 245 [id number]   Basic Metabolic Panel  Recent Labs  09/11/15 0040 09/12/15 0505  NA 139 140  K 3.8 4.5  CL 103 103  CO2 24 24  GLUCOSE 123* 100*  BUN 11 12  CREATININE 1.09 1.07  CALCIUM 10.1 10.1   Liver Function Tests  Recent Labs  09/11/15 0040  AST 31  ALT 36  ALKPHOS 70  BILITOT 1.1  PROT 7.2  ALBUMIN 3.6   No results for input(s): LIPASE, AMYLASE in the last 72 hours. Cardiac Enzymes  Recent Labs  09/11/15 0040 09/11/15 1125  TROPONINI 0.39* 0.35*   BNP Invalid input(s): POCBNP D-Dimer  Recent Labs  09/11/15 0040  DDIMER 0.31   Hemoglobin A1C No results for input(s): HGBA1C in the last 72 hours. Fasting Lipid  Panel No results for input(s): CHOL, HDL, LDLCALC, TRIG, CHOLHDL, LDLDIRECT in the last 72 hours. Thyroid Function Tests No results for input(s): TSH, T4TOTAL, T3FREE, THYROIDAB in the last 72 hours.  Invalid input(s): FREET3  Telemetry    NSR, HR in 70's - 80's. Lots of artifact, no significant ectopy.  ECG    No new tracings.   Cardiac Studies and Radiology    Dg Chest 2 View: 09/10/2015  CLINICAL DATA:  Sudden onset of mid chest pain radiating to the back. Shortness of breath. Cough for several months. EXAM: CHEST  2 VIEW COMPARISON:  Chest radiographs 07/12/2008 FINDINGS: The cardiomediastinal contours are normal. Linear opacities in both lung bases consistent with atelectasis. Ill-defined right midlung opacity, not characterized on the lateral view. Pulmonary vasculature is normal. No pleural effusion  or pneumothorax. No acute osseous abnormalities are seen. IMPRESSION: Linear bibasilar atelectasis. Ill-defined opacity in the right midlung zone may reflect an additional site of atelectasis, versus infectious or inflammatory pneumonitis. Electronically Signed   By: Jeb Levering M.D.   On: 09/10/2015 20:35   Dg Ribs Bilateral: 09/11/2015  CLINICAL DATA:  Pt. [place] with his left side hitting the wooden floor. Most pain left anterior to axillary below diaphragm ribs. Some pain yesterday on right anterior below diaphragm ribs. EXAM: BILATERAL RIBS - 3+ VIEW COMPARISON:  09/10/2015 FINDINGS: No displaced rib fractures on the LEFT or RIGHT.  No pneumothorax. IMPRESSION: No rib fractures or pneumothorax. Electronically Signed   By: Suzy Bouchard M.D.   On: 09/11/2015 10:39    Echocardiogram: Pending  Assessment & Plan    1. NSTEMI - presented with substernal chest pressure associated with dyspnea, diaphoresis and nausea. Initial troponin was elevated at 0.39, trending down to 0.35. - Continue IV heparin, ASA, statin and BB.  - the risks and benefits of the procedure have been discussed with patient and he agrees to proceed. Scheduled for this afternoon with Dr. Gwenlyn Found.    2. HTN - BP has been 107/55 - 152/96 in the past 24 hours. Recently started on BB. - continue to monitor.  3. HLD - recent LP in 06/2015 showed elevated LDL at 111. TG also elevated at 307.  - started on Atorvastatin 40mg  daily at that time. If significant CAD noted on cath today, would increase to 80mg  daily.  Arna Medici , PA-C 7:47 AM 09/12/2015 Pager: [phone]  I have personally seen and examined this patient with Bernerd Pho, PA-C. I agree with the assessment and plan as outlined above. He is admitted with a NSTEMI. Plan for cardiac cath with probable PCI later today.   Derick Seminara 09/12/2015 9:46 AM

## 2015-09-12 NOTE — Progress Notes (Signed)
  Echocardiogram 2D Echocardiogram has been performed.  Johnny [name] 09/12/2015, 10:51 AM

## 2015-09-12 NOTE — Progress Notes (Signed)
Notified Dr. Philbert Riser that patient's bedtime dose of 25mg  metoprolol is being held per ordered parameters. BP is now 87/59; pt is asymptomatic; Dr. Philbert Riser aware. Will continue to monitor.

## 2015-09-12 NOTE — Care Management Obs Status (Signed)
[place] NOTIFICATION   Patient Details  Name: Johnny [name] MRN: [id number] Date of Birth: November 08, 1950   Medicare Observation Status Notification Given:  Yes (Chest Pain)    Bethena Roys, RN 09/12/2015, 10:57 AM

## 2015-09-12 NOTE — Progress Notes (Signed)
PROGRESS NOTE        PATIENT DETAILS Name: Johnny [name] Age: 65 y.o. Sex: male Date of Birth: 1950-07-21 Admit Date: 09/10/2015 Admitting Physician Lavina Hamman, MD WW:[id number] J, MD Outpatient Specialists:None  Brief Narrative: Patient is a 65 y.o. male h/o HTN, HLD, ? Prior TIA and GERD, but no known h/o CAD, admitted with chest pain-found to have NSTEMI. Scheduled for [facility] 4/24  Subjective: No chest pain or SOB.   Assessment/Plan: Principal Problem: NSTEMI: continue IV Heparin,ASA and statin, and Metoprolol, Cards consulted-plans are for [facility] 4/24   Active Problems: ?Bronchitis:doubt needs antibiotics-hence off Rocephin/Zithromax. Lungs are completely clear on exam.  HTN: Controlled-Continue Metoprolol and Losartan. HCTZ discontinued  Dyslipidemia:continue Statin  GERD: PPI  Chronic back pain:Continue Percocet and Neurontin   DVT Prophylaxis: Full dose anticoagulation with Heparin  Code Status: Full code   Family Communication: Family member sleeping at bedside  Disposition Plan: Remain inpatient-Dispo will depend on [facility] results  Antimicrobial agents: Rocephin 4/23>>4/24 Zithromax 4/23>>4/23  Procedures: None  CONSULTS:  cardiology  Time spent: 20 minutes-Greater than 50% of this time was spent in counseling, explanation of diagnosis, planning of further management, and coordination of care.  MEDICATIONS: Anti-infectives    Start     Dose/Rate Route Frequency Ordered Stop   09/11/15 0430  cefTRIAXone (ROCEPHIN) 1 g in dextrose 5 % 50 mL IVPB  Status:  Discontinued     1 g 100 mL/hr over 30 Minutes Intravenous Daily 09/11/15 0358 09/12/15 0720   09/11/15 0430  azithromycin (ZITHROMAX) tablet 500 mg  Status:  Discontinued     500 mg Oral Daily 09/11/15 0358 09/11/15 1322      Scheduled Meds: . aspirin  325 mg Oral Daily  . atorvastatin  40 mg Oral q1800  . citalopram  20 mg Oral Daily  . gabapentin  300 mg Oral  QHS  . losartan  100 mg Oral Daily  . methylphenidate  20 mg Oral Daily  . metoprolol tartrate  25 mg Oral BID  . pantoprazole  40 mg Oral Daily  . sodium chloride flush  3 mL Intravenous Q12H   Continuous Infusions: . sodium chloride 1 mL/kg/hr (09/12/15 0715)  . heparin 1,550 Units/hr (09/11/15 2030)   PRN Meds:.sodium chloride, acetaminophen, gi cocktail, morphine injection, nitroGLYCERIN, ondansetron (ZOFRAN) IV, oxyCODONE-acetaminophen **AND** oxyCODONE, sodium chloride flush   PHYSICAL EXAM: Vital signs: Filed Vitals:   09/11/15 2100 09/12/15 0022 09/12/15 0438 09/12/15 0744  BP: 150/88 107/55 142/86 109/74  Pulse: 70 63 72 77  Temp: 98.2 F (36.8 C) 98.5 F (36.9 C) 98.2 F (36.8 C) 98.2 F (36.8 C)  TempSrc: Oral Oral Oral Oral  Resp:    18  Height:      Weight:   100.336 kg (221 lb 3.2 oz)   SpO2: 100% 97% 100% 96%   Filed Weights   09/10/15 1958 09/11/15 0014 09/12/15 0438  Weight: 104.327 kg (230 lb) 102.422 kg (225 lb 12.8 oz) 100.336 kg (221 lb 3.2 oz)   Body mass index is 29.99 kg/(m^2).   Gen Exam: Awake and alert with clear speech. Not in any distress  Neck: Supple, No JVD.   Chest: B/L Clear.  No rhonchi or rales CVS: S1 S2 Regular, no murmurs.  Abdomen: soft, BS +, non tender, non distended.  Extremities: no edema, lower extremities warm to  touch. Neurologic: Non Focal.   Skin: No Rash or lesions   Wounds: N/A.   LABORATORY DATA: CBC:  Recent Labs Lab 09/10/15 2025 09/11/15 0040 09/12/15 0505  WBC 12.8* 11.6* 9.0  NEUTROABS  --  7.7  --   HGB 12.5* 12.6* 13.5  HCT 37.0* 37.9* 40.3  MCV 94.6 95.7 95.7  PLT 221 245 [id number]    Basic Metabolic Panel:  Recent Labs Lab 09/10/15 2025 09/11/15 0040 09/12/15 0505  NA 136 139 140  K 3.7 3.8 4.5  CL 103 103 103  CO2 22 24 24   GLUCOSE 95 123* 100*  BUN 10 11 12   CREATININE 1.05 1.09 1.07  CALCIUM 9.7 10.1 10.1    GFR: Estimated Creatinine Clearance: 87.8 mL/min (by C-G formula based  on Cr of 1.07).  Liver Function Tests:  Recent Labs Lab 09/11/15 0040  AST 31  ALT 36  ALKPHOS 70  BILITOT 1.1  PROT 7.2  ALBUMIN 3.6   No results for input(s): LIPASE, AMYLASE in the last 168 hours. No results for input(s): AMMONIA in the last 168 hours.  Coagulation Profile:  Recent Labs Lab 09/12/15 0505  INR 1.06    Cardiac Enzymes:  Recent Labs Lab 09/11/15 0040 09/11/15 1125  TROPONINI 0.39* 0.35*    BNP (last 3 results) No results for input(s): PROBNP in the last 8760 hours.  HbA1C: No results for input(s): HGBA1C in the last 72 hours.  CBG: No results for input(s): GLUCAP in the last 168 hours.  Lipid Profile: No results for input(s): CHOL, HDL, LDLCALC, TRIG, CHOLHDL, LDLDIRECT in the last 72 hours.  Thyroid Function Tests: No results for input(s): TSH, T4TOTAL, FREET4, T3FREE, THYROIDAB in the last 72 hours.  Anemia Panel: No results for input(s): VITAMINB12, FOLATE, FERRITIN, TIBC, IRON, RETICCTPCT in the last 72 hours.  Urine analysis:    Component Value Date/Time   COLORURINE YELLOW 07/13/2015 [place] 07/13/2015 0644   LABSPEC 1.016 07/13/2015 0644   PHURINE 7.0 07/13/2015 0644   GLUCOSEU NEGATIVE 07/13/2015 0644   HGBUR NEGATIVE 07/13/2015 0644   BILIRUBINUR NEGATIVE 07/13/2015 0644   BILIRUBINUR neg 06/24/2015 1508   KETONESUR NEGATIVE 07/13/2015 0644   PROTEINUR NEGATIVE 07/13/2015 0644   PROTEINUR neg 06/24/2015 1508   UROBILINOGEN 0.2 06/24/2015 1508   UROBILINOGEN 0.2 10/26/2014 0337   NITRITE NEGATIVE 07/13/2015 0644   NITRITE neg 06/24/2015 1508   LEUKOCYTESUR NEGATIVE 07/13/2015 0644    Sepsis Labs: Lactic Acid, Venous No results found for: LATICACIDVEN  MICROBIOLOGY: Recent Results (from the past 240 hour(s))  MRSA PCR Screening     Status: None   Collection Time: 09/11/15 12:20 AM  Result Value Ref Range Status   MRSA by PCR NEGATIVE NEGATIVE Final    Comment:        The GeneXpert MRSA Assay  (FDA approved for NASAL specimens only), is one component of a comprehensive MRSA colonization surveillance program. It is not intended to diagnose MRSA infection nor to guide or monitor treatment for MRSA infections.   Culture, sputum-assessment     Status: None   Collection Time: 09/12/15  6:03 AM  Result Value Ref Range Status   Specimen Description SPUTUM  Final   Special Requests NONE  Final   Sputum evaluation   Final    THIS SPECIMEN IS ACCEPTABLE. RESPIRATORY CULTURE REPORT TO FOLLOW.   Report Status 09/12/2015 FINAL  Final    RADIOLOGY STUDIES/RESULTS: Dg Chest 2 View  09/10/2015  CLINICAL DATA:  Sudden onset of mid chest pain radiating to the back. Shortness of breath. Cough for several months. EXAM: CHEST  2 VIEW COMPARISON:  Chest radiographs 07/12/2008 FINDINGS: The cardiomediastinal contours are normal. Linear opacities in both lung bases consistent with atelectasis. Ill-defined right midlung opacity, not characterized on the lateral view. Pulmonary vasculature is normal. No pleural effusion or pneumothorax. No acute osseous abnormalities are seen. IMPRESSION: Linear bibasilar atelectasis. Ill-defined opacity in the right midlung zone may reflect an additional site of atelectasis, versus infectious or inflammatory pneumonitis. Electronically Signed   By: Jeb Levering M.D.   On: 09/10/2015 20:35   Dg Ribs Bilateral  09/11/2015  CLINICAL DATA:  [place]. [place]27[place] with his left side hitting the wooden floor. Most pain left anterior to axillary below diaphragm ribs. Some pain yesterday on right anterior below diaphragm ribs. EXAM: BILATERAL RIBS - 3+ VIEW COMPARISON:  09/10/2015 FINDINGS: No displaced rib fractures on the LEFT or RIGHT.  No pneumothorax. IMPRESSION: No rib fractures or pneumothorax. Electronically Signed   By: Suzy Bouchard M.D.   On: 09/11/2015 10:39     Oren Binet, MD  [facility] Pager:[phone]  If 7PM-7AM, please  contact night-coverage [email or web address] Password [facility] 09/12/2015, 9:33 AM

## 2015-09-12 NOTE — Interval H&P Note (Signed)
Cath Lab Visit (complete for each Cath Lab visit)  Clinical Evaluation Leading to the Procedure:   ACS: Yes.    Non-ACS:    Anginal Classification: CCS III  Anti-ischemic medical therapy: Minimal Therapy (1 class of medications)  Non-Invasive Test Results: No non-invasive testing performed  Prior CABG: No previous CABG      History and Physical Interval Note:  09/12/2015 1:36 PM  Johnny [name]  has presented today for surgery, with the diagnosis of unstable angina  The various methods of treatment have been discussed with the patient and family. After consideration of risks, benefits and other options for treatment, the patient has consented to  Procedure(s): Left Heart Cath and Coronary Angiography (N/A) as a surgical intervention .  The patient's history has been reviewed, patient examined, no change in status, stable for surgery.  I have reviewed the patient's chart and labs.  Questions were answered to the patient's satisfaction.     Quay Burow

## 2015-09-12 NOTE — Progress Notes (Signed)
Pt states he had taken Lipitor some years ago and this caused joint pain. Later he began taking Crestor, which also caused joint pain.  Lipitor is currently an active order for pt.  Pharmacy called and they will add Lipitor to Allergies/Intolerances.

## 2015-09-12 NOTE — H&P (View-Only) (Signed)
Hospital Problem List     Principal Problem:   Chest pain Active Problems:   HYPERCHOLESTEROLEMIA   Essential hypertension   GE reflux   Chronic back pain   NSTEMI (non-ST elevated myocardial infarction) [facility])    Patient Profile:   Primary Cardiologist: Dr. [name]  65 yo male w/ PMH of HTN, HLD, and GERD admitted on 09/10/2015 for chest pain. Troponin peaked at 0.39. For cardiac cath today.  Subjective   Denies any recurrent substernal chest pain. Reports having back pain throughout the night (chronic). No remaining questions about his procedure today.  Inpatient Medications    . aspirin  325 mg Oral Daily  . atorvastatin  40 mg Oral q1800  . citalopram  20 mg Oral Daily  . gabapentin  300 mg Oral QHS  . losartan  100 mg Oral Daily  . methylphenidate  20 mg Oral Daily  . metoprolol tartrate  25 mg Oral BID  . pantoprazole  40 mg Oral Daily  . sodium chloride flush  3 mL Intravenous Q12H    Vital Signs    Filed Vitals:   09/11/15 2100 09/12/15 0022 09/12/15 0438 09/12/15 0744  BP: 150/88 107/55 142/86 109/74  Pulse: 70 63 72 77  Temp: 98.2 F (36.8 C) 98.5 F (36.9 C) 98.2 F (36.8 C) 98.2 F (36.8 C)  TempSrc: Oral Oral Oral Oral  Resp:    18  Height:      Weight:   221 lb 3.2 oz (100.336 kg)   SpO2: 100% 97% 100% 96%    Intake/Output Summary (Last 24 hours) at 09/12/15 0747 Last data filed at 09/12/15 0700  Gross per 24 hour  Intake 1100.63 ml  Output   1750 ml  Net -649.37 ml   Filed Weights   09/10/15 1958 09/11/15 0014 09/12/15 0438  Weight: 230 lb (104.327 kg) 225 lb 12.8 oz (102.422 kg) 221 lb 3.2 oz (100.336 kg)    Physical Exam    General: Well developed, well nourished, male appearing in no acute distress. Head: Normocephalic, atraumatic.  Neck: Supple without bruits, JVD not elevated. Lungs:  Resp regular and unlabored, CTA without wheezing or rales. Heart: RRR, S1, S2, no S3, S4, or murmur; no rub. Abdomen: Soft, non-tender,  non-distended with normoactive bowel sounds. No hepatomegaly. No rebound/guarding. No obvious abdominal masses. Extremities: No clubbing, cyanosis, or edema. Distal pedal pulses are 2+ bilaterally. Neuro: Alert and oriented X 3. Moves all extremities spontaneously. Psych: Normal affect.  Labs    CBC  Recent Labs  09/11/15 0040 09/12/15 0505  WBC 11.6* 9.0  NEUTROABS 7.7  --   HGB 12.6* 13.5  HCT 37.9* 40.3  MCV 95.7 95.7  PLT 245 [id number]   Basic Metabolic Panel  Recent Labs  09/11/15 0040 09/12/15 0505  NA 139 140  K 3.8 4.5  CL 103 103  CO2 24 24  GLUCOSE 123* 100*  BUN 11 12  CREATININE 1.09 1.07  CALCIUM 10.1 10.1   Liver Function Tests  Recent Labs  09/11/15 0040  AST 31  ALT 36  ALKPHOS 70  BILITOT 1.1  PROT 7.2  ALBUMIN 3.6   No results for input(s): LIPASE, AMYLASE in the last 72 hours. Cardiac Enzymes  Recent Labs  09/11/15 0040 09/11/15 1125  TROPONINI 0.39* 0.35*   BNP Invalid input(s): POCBNP D-Dimer  Recent Labs  09/11/15 0040  DDIMER 0.31   Hemoglobin A1C No results for input(s): HGBA1C in the last 72 hours. Fasting Lipid  Panel No results for input(s): CHOL, HDL, LDLCALC, TRIG, CHOLHDL, LDLDIRECT in the last 72 hours. Thyroid Function Tests No results for input(s): TSH, T4TOTAL, T3FREE, THYROIDAB in the last 72 hours.  Invalid input(s): FREET3  Telemetry    NSR, HR in 70's - 80's. Lots of artifact, no significant ectopy.  ECG    No new tracings.   Cardiac Studies and Radiology    Dg Chest 2 View: 09/10/2015  CLINICAL DATA:  Sudden onset of mid chest pain radiating to the back. Shortness of breath. Cough for several months. EXAM: CHEST  2 VIEW COMPARISON:  Chest radiographs 07/12/2008 FINDINGS: The cardiomediastinal contours are normal. Linear opacities in both lung bases consistent with atelectasis. Ill-defined right midlung opacity, not characterized on the lateral view. Pulmonary vasculature is normal. No pleural effusion  or pneumothorax. No acute osseous abnormalities are seen. IMPRESSION: Linear bibasilar atelectasis. Ill-defined opacity in the right midlung zone may reflect an additional site of atelectasis, versus infectious or inflammatory pneumonitis. Electronically Signed   By: Jeb Levering M.D.   On: 09/10/2015 20:35   Dg Ribs Bilateral: 09/11/2015  CLINICAL DATA:  Pt. [place]27[place] with his left side hitting the wooden floor. Most pain left anterior to axillary below diaphragm ribs. Some pain yesterday on right anterior below diaphragm ribs. EXAM: BILATERAL RIBS - 3+ VIEW COMPARISON:  09/10/2015 FINDINGS: No displaced rib fractures on the LEFT or RIGHT.  No pneumothorax. IMPRESSION: No rib fractures or pneumothorax. Electronically Signed   By: Suzy Bouchard M.D.   On: 09/11/2015 10:39    Echocardiogram: Pending  Assessment & Plan    1. NSTEMI - presented with substernal chest pressure associated with dyspnea, diaphoresis and nausea. Initial troponin was elevated at 0.39, trending down to 0.35. - Continue IV heparin, ASA, statin and BB.  - the risks and benefits of the procedure have been discussed with patient and he agrees to proceed. Scheduled for this afternoon with Dr. Gwenlyn Found.    2. HTN - BP has been 107/55 - 152/96 in the past 24 hours. Recently started on BB. - continue to monitor.  3. HLD - recent LP in 06/2015 showed elevated LDL at 111. TG also elevated at 307.  - started on Atorvastatin 40mg  daily at that time. If significant CAD noted on cath today, would increase to 80mg  daily.  Arna Medici , PA-C 7:47 AM 09/12/2015 Pager: [phone]  I have personally seen and examined this patient with Bernerd Pho, PA-C. I agree with the assessment and plan as outlined above. He is admitted with a NSTEMI. Plan for cardiac cath with probable PCI later today.   Dail Lerew 09/12/2015 9:46 AM

## 2015-09-13 ENCOUNTER — Telehealth: Payer: Self-pay | Admitting: Cardiovascular Disease

## 2015-09-13 DIAGNOSIS — I25110 Atherosclerotic heart disease of native coronary artery with unstable angina pectoris: Secondary | ICD-10-CM

## 2015-09-13 DIAGNOSIS — I2510 Atherosclerotic heart disease of native coronary artery without angina pectoris: Secondary | ICD-10-CM | POA: Insufficient documentation

## 2015-09-13 DIAGNOSIS — J189 Pneumonia, unspecified organism: Secondary | ICD-10-CM | POA: Insufficient documentation

## 2015-09-13 DIAGNOSIS — J984 Other disorders of lung: Secondary | ICD-10-CM | POA: Insufficient documentation

## 2015-09-13 DIAGNOSIS — I25118 Atherosclerotic heart disease of native coronary artery with other forms of angina pectoris: Secondary | ICD-10-CM | POA: Insufficient documentation

## 2015-09-13 LAB — BASIC METABOLIC PANEL
Anion gap: 11 (ref 5–15)
BUN: 13 mg/dL (ref 6–20)
CALCIUM: 9.9 mg/dL (ref 8.9–10.3)
CO2: 24 mmol/L (ref 22–32)
CREATININE: 1.26 mg/dL — AB (ref 0.61–1.24)
Chloride: 105 mmol/L (ref 101–111)
GFR, EST NON AFRICAN AMERICAN: 59 mL/min — AB (ref >60)
Glucose, Bld: 92 mg/dL (ref 65–99)
Potassium: 4.4 mmol/L (ref 3.5–5.1)
SODIUM: 140 mmol/L (ref 135–145)

## 2015-09-13 LAB — CBC
HCT: 37.3 % — ABNORMAL LOW (ref 39.0–52.0)
HEMOGLOBIN: 12.2 g/dL — AB (ref 13.0–17.0)
MCH: 31.9 pg (ref 26.0–34.0)
MCHC: 32.7 g/dL (ref 30.0–36.0)
MCV: 97.6 fL (ref 78.0–100.0)
PLATELETS: 279 10*3/uL (ref 150–400)
RBC: 3.82 MIL/uL — ABNORMAL LOW (ref 4.22–5.81)
RDW: 12.4 % (ref 11.5–15.5)
WBC: 8.8 10*3/uL (ref 4.0–10.5)

## 2015-09-13 MED ORDER — LOSARTAN POTASSIUM 25 MG PO TABS: 25.0000 mg | ORAL_TABLET | Freq: Every day | ORAL | Status: DC

## 2015-09-13 MED ORDER — METOPROLOL TARTRATE 25 MG PO TABS: 25.0000 mg | ORAL_TABLET | Freq: Two times a day (BID) | ORAL | Status: DC

## 2015-09-13 MED ORDER — NITROGLYCERIN 0.4 MG SL SUBL: 0.4000 mg | SUBLINGUAL_TABLET | SUBLINGUAL | Status: DC | PRN

## 2015-09-13 MED ORDER — CLOPIDOGREL BISULFATE 75 MG PO TABS
300.0000 mg | ORAL_TABLET | Freq: Once | ORAL | Status: AC
  Administered 2015-09-13: 300 mg via ORAL
  Filled 2015-09-13: qty 4

## 2015-09-13 MED ORDER — ISOSORBIDE MONONITRATE ER 30 MG PO TB24
15.0000 mg | ORAL_TABLET | Freq: Every day | ORAL | Status: DC
  Administered 2015-09-13: 15 mg via ORAL
  Filled 2015-09-13 (×2): qty 1

## 2015-09-13 MED ORDER — ISOSORBIDE MONONITRATE ER 30 MG PO TB24: 30.0000 mg | ORAL_TABLET | Freq: Every day | ORAL | Status: DC

## 2015-09-13 MED ORDER — ATORVASTATIN CALCIUM 40 MG PO TABS: 40.0000 mg | ORAL_TABLET | Freq: Every day | ORAL | Status: DC

## 2015-09-13 MED ORDER — LOSARTAN POTASSIUM 50 MG PO TABS: 50.0000 mg | ORAL_TABLET | Freq: Every day | ORAL | Status: DC

## 2015-09-13 MED ORDER — CLOPIDOGREL BISULFATE 75 MG PO TABS: 75.0000 mg | ORAL_TABLET | Freq: Every day | ORAL | Status: DC

## 2015-09-13 MED ORDER — SODIUM CHLORIDE 0.9 % IV SOLN
INTRAVENOUS | Status: DC
  Administered 2015-09-13: 10:00:00 via INTRAVENOUS

## 2015-09-13 MED ORDER — METOPROLOL TARTRATE 12.5 MG HALF TABLET
12.5000 mg | ORAL_TABLET | Freq: Two times a day (BID) | ORAL | Status: DC
  Administered 2015-09-13: 12.5 mg via ORAL
  Filled 2015-09-13: qty 1

## 2015-09-13 NOTE — Telephone Encounter (Signed)
TCM Patient... Appt on 058/08/17 at 3pm w/ Cecilie Kicks   Thanks

## 2015-09-13 NOTE — Telephone Encounter (Signed)
TCM - discharge planned for 09/13/15 First call attempt to be made 09/14/15

## 2015-09-13 NOTE — Progress Notes (Signed)
[name] Name: Johnny [name] Date of Encounter: 09/13/2015  Principal Problem:   Chest pain Active Problems:   HYPERCHOLESTEROLEMIA   Essential hypertension   GE reflux   Chronic back pain   NSTEMI (non-ST elevated myocardial infarction) [name])   Primary Cardiologist: Dr Johnny [name] [name] Profile: 65 yo male w/ hx HTN, HLD, ?TIA, GERD, admitted 04/22 w/ chest pain, trop 0.35, s/p cath w/ med rx rec, results below.  SUBJECTIVE: Had problems being light-headed yesterday, ?combo of sedation, decreased po intake and new meds. SBP 70s at one point. Feels better today. No chest pain or SOB, has not been OOB much.  OBJECTIVE Filed Vitals:   09/12/15 2239 09/12/15 2323 09/13/15 0600 09/13/15 0741  BP: 101/72 105/64 96/48 104/66  Pulse: 67 65 57 69  Temp:  98.1 F (36.7 C) 97.9 F (36.6 C) 98 F (36.7 C)  TempSrc:  Oral Oral Oral  Resp: 15 13 15 16   Height:      Weight:   225 lb (102.059 kg)   SpO2: 94% 99% 93% 94%    Intake/Output Summary (Last 24 hours) at 09/13/15 0924 Last data filed at 09/13/15 0600  Gross per 24 hour  Intake    240 ml  Output   1025 ml  Net   -785 ml   Filed Weights   09/11/15 0014 09/12/15 0438 09/13/15 0600  Weight: 225 lb 12.8 oz (102.422 kg) 221 lb 3.2 oz (100.336 kg) 225 lb (102.059 kg)    PHYSICAL EXAM General: Well developed, well nourished, male in no acute distress. Head: Normocephalic, atraumatic.  Neck: Supple without bruits, JVD not elevated. Lungs:  Resp regular and unlabored, CTA. Heart: RRR, S1, S2, no S3, S4, or murmur; no rub. Abdomen: Soft, non-tender, non-distended, BS + x 4.  Extremities: No clubbing, cyanosis, edema. R radial cath site without ecchymosis or hematoma Neuro: Alert and oriented X 3. Moves all extremities spontaneously. Psych: Normal affect.  LABS: CBC: Recent Labs  09/11/15 0040 09/12/15 0505 09/13/15 0641  WBC 11.6* 9.0 8.8  NEUTROABS 7.7  --   --   HGB 12.6* 13.5 12.2*  HCT 37.9* 40.3 37.3*    MCV 95.7 95.7 97.6  PLT 245 285 279   INR: Recent Labs  09/12/15 0505  INR 0000000   Basic Metabolic Panel: Recent Labs  09/12/15 0505 09/13/15 0641  NA 140 140  K 4.5 4.4  CL 103 105  CO2 24 24  GLUCOSE 100* 92  BUN 12 13  CREATININE 1.07 1.26*  CALCIUM 10.1 9.9   Liver Function Tests: Recent Labs  09/11/15 0040  AST 31  ALT 36  ALKPHOS 70  BILITOT 1.1  PROT 7.2  ALBUMIN 3.6   Cardiac Enzymes: Recent Labs  09/11/15 0040 09/11/15 1125  TROPONINI 0.39* 0.35*    Recent Labs  09/10/15 2034  TROPIPOC 0.01   D-dimer: Recent Labs  09/11/15 0040  DDIMER 0.31    TELE:  SR, s brady w/ HR high 50s    ECHO: 09/12/2015 - Left ventricle: The cavity size was normal. Wall thickness was  normal. Systolic function was normal. The estimated ejection  fraction was in the range of 55% to 60%. Wall motion was normal;  there were no regional wall motion abnormalities. Doppler  parameters are consistent with abnormal left ventricular  relaxation (grade 1 diastolic dysfunction). - Aortic root: The aortic root was mildly dilated. Impressions: - Normal LV systolic function; probable mild diastolic dysfunction;  trace MR.  CATH: 04/24  Ramus lesion, 95% stenosed.  1st Diag lesion, 90% stenosed.  Mid LAD to Dist LAD lesion, 50% stenosed.  The left ventricular systolic function is normal. IMPRESSION: Johnny [name] has branch vessel disease and a high marginal branch and a first diagonal branch. These are small vessels in diameter though they are fairly long in length. Johnny [name] has moderate segmental mid LAD disease as well. At Johnny point, I would recommend optimizing his antianginal medications. She had continued chest pain Johnny [name] would be a candidate for angioplasty of his diagonal branch and marginal branch. The sheath was removed and a TR band was placed on the right wrist to achieve patent hemostasis. The [name] left the lab in stable  condition.  Radiology/Studies: No results found.   Current Medications:  . aspirin  81 mg Oral Daily  . aspirin  325 mg Oral Daily  . atorvastatin  40 mg Oral q1800  . citalopram  20 mg Oral Daily  . gabapentin  300 mg Oral QHS  . isosorbide mononitrate  30 mg Oral Daily  . losartan  50 mg Oral Daily  . methylphenidate  20 mg Oral Daily  . metoprolol tartrate  25 mg Oral BID  . pantoprazole  40 mg Oral Daily  . sodium chloride flush  3 mL Intravenous Q12H      ASSESSMENT AND PLAN: Principal Problem:   Chest pain - treat CAD with ASA 325 mg (will d/c 81 mg), statin, ARB, BB, Imdur - with hypotension yesterday, will decrease Imdur dose to 15 mg qd and losartan to 25 mg qd. - since Cr up some after cath, hold losartan today. HCTZ already on hold. (pta on 100/12.5) - will reduce BB as well for SBP 101 prior to meds being given. - with Cr bump after cath, hydrate at 75 cc/hr for a few hours.  Otherwise, per IM Active Problems:   HYPERCHOLESTEROLEMIA   Essential hypertension   GE reflux   Chronic back pain   NSTEMI (non-ST elevated myocardial infarction) ([place])   Signed, Barrett, Rhonda , PA-C 9:24 AM 09/13/2015  I have personally seen and examined Johnny [name] with Rosaria Ferries, PA-C. I agree with the assessment and plan as outlined above. I would favor treating him with ASA and Plavix given his presentation with NSTEMI. Would continue DAPT for at least 3 months then can go back to ASA alone. Continue beta blocker. Try low dose Crestor for now.   OK to d/c home today. Follow up Dr. Johnsie [name] in 2 weeks.   Johnny [name] 09/13/2015 10:38 AM

## 2015-09-13 NOTE — Discharge Summary (Addendum)
PATIENT DETAILS Name: Johnny [name] Age: 65 y.o. Sex: male Date of Birth: 07-May-1951 MRN: [id number]. Admitting Physician: Lavina Hamman, MD [id number] J, MD  Admit Date: 09/10/2015 Discharge date: 09/13/2015  Recommendations for Outpatient Follow-up:  1. Repeat CXR in 2-4 weeks to see if ill defined midlung opacity (infectious vs inflammatory) resolves, if not-will require CT Chest.  2. Ensure follow up with cardiology 3. Check BMET at next visit  PRIMARY DISCHARGE DIAGNOSIS:  Principal Problem:   Chest pain Active Problems:   HYPERCHOLESTEROLEMIA   Essential hypertension   GE reflux   Chronic back pain   NSTEMI (non-ST elevated myocardial infarction) ([place])   Pneumonitis   Coronary artery disease involving native coronary artery of native heart with unstable angina pectoris [facility])      PAST MEDICAL HISTORY: Past Medical History  Diagnosis Date  . Hyperlipidemia   . Hypertension   . GERD (gastroesophageal reflux disease)   . Osteopenia   . Internal hemorrhoids   . Adenomatous polyps   . Low testosterone   . Vitamin D deficiency   . Chronic back pain   . TIA (transient ischemic attack) 2008  . Shortness of breath dyspnea   . Bronchitis   . Ventral hernia   . Spondylosis     DISCHARGE MEDICATIONS: Current Discharge Medication List    START taking these medications   Details  atorvastatin (LIPITOR) 40 MG tablet Take 1 tablet (40 mg total) by mouth daily at 6 PM. Qty: 30 tablet, Refills: 0    clopidogrel (PLAVIX) 75 MG tablet Take 1 tablet (75 mg total) by mouth daily. Qty: 30 tablet, Refills: 0    isosorbide mononitrate (IMDUR) 30 MG 24 hr tablet Take 1 tablet (30 mg total) by mouth daily. Qty: 30 tablet, Refills: 0    metoprolol tartrate (LOPRESSOR) 25 MG tablet Take 1 tablet (25 mg total) by mouth 2 (two) times daily. Qty: 60 tablet, Refills: 0    nitroGLYCERIN (NITROSTAT) 0.4 MG SL tablet Place 1 tablet (0.4 mg total) under the tongue every 5  (five) minutes as needed for chest pain. Qty: 30 tablet, Refills: 0      CONTINUE these medications which have NOT CHANGED   Details  aspirin 325 MG tablet Take 325 mg by mouth daily.    citalopram (CELEXA) 20 MG tablet Take 20 mg by mouth daily. Refills: 6    gabapentin (NEURONTIN) 300 MG capsule Take 300 mg by mouth at bedtime.    methylphenidate (RITALIN) 20 MG tablet Take 20 mg by mouth daily. Reported on 09/10/2015 Refills: 0    Multiple Vitamin (MULTIVITAMIN) tablet Take 1 tablet by mouth daily.    omeprazole (PRILOSEC) 20 MG capsule Take 20 mg by mouth daily.      oxyCODONE-acetaminophen (PERCOCET) 10-325 MG tablet Take 2 tablets by mouth every 6 (six) hours as needed for pain. Patient gets #180 pills monthly from Dr. Carloyn Manner, last filled February 6.      STOP taking these medications     losartan-hydrochlorothiazide (HYZAAR) 100-12.5 MG tablet         ALLERGIES:   Allergies  Allergen Reactions  . Atorvastatin Other (See Comments)    Joint pain  . Crestor [Rosuvastatin Calcium] Other (See Comments)    Joint pain  . Metadate Cd [Methylphenidate Hcl Er (Cd)] Other (See Comments)    Burning, GI upset, bloating  . Morphine And Related Nausea And Vomiting  . Hydrocodone Nausea Only    Can take with food  BRIEF HPI:  See H&P, Labs, Consult and Test reports for all details in brief, patient was admitted for evaluation of chest pain.  CONSULTATIONS:   cardiology  PERTINENT RADIOLOGIC STUDIES: Dg Chest 2 View  09/10/2015  CLINICAL DATA:  Sudden onset of mid chest pain radiating to the back. Shortness of breath. Cough for several months. EXAM: CHEST  2 VIEW COMPARISON:  Chest radiographs 07/12/2008 FINDINGS: The cardiomediastinal contours are normal. Linear opacities in both lung bases consistent with atelectasis. Ill-defined right midlung opacity, not characterized on the lateral view. Pulmonary vasculature is normal. No pleural effusion or pneumothorax. No acute  osseous abnormalities are seen. IMPRESSION: Linear bibasilar atelectasis. Ill-defined opacity in the right midlung zone may reflect an additional site of atelectasis, versus infectious or inflammatory pneumonitis. Electronically Signed   By: Jeb Levering M.D.   On: 09/10/2015 20:35   Dg Ribs Bilateral  09/11/2015  CLINICAL DATA:  Pt. [place] 08/27/2015 at a bowling alley with his left side hitting the wooden floor. Most pain left anterior to axillary below diaphragm ribs. Some pain yesterday on right anterior below diaphragm ribs. EXAM: BILATERAL RIBS - 3+ VIEW COMPARISON:  09/10/2015 FINDINGS: No displaced rib fractures on the LEFT or RIGHT.  No pneumothorax. IMPRESSION: No rib fractures or pneumothorax. Electronically Signed   By: Suzy Bouchard M.D.   On: 09/11/2015 10:39     PERTINENT LAB RESULTS: CBC:  Recent Labs  09/12/15 0505 09/13/15 0641  WBC 9.0 8.8  HGB 13.5 12.2*  HCT 40.3 37.3*  PLT 285 279   CMET CMP     Component Value Date/Time   NA 140 09/13/2015 0641   K 4.4 09/13/2015 0641   CL 105 09/13/2015 0641   CO2 24 09/13/2015 0641   GLUCOSE 92 09/13/2015 0641   BUN 13 09/13/2015 0641   CREATININE 1.26* 09/13/2015 0641   CREATININE 1.05 06/23/2015 0912   CALCIUM 9.9 09/13/2015 0641   PROT 7.2 09/11/2015 0040   ALBUMIN 3.6 09/11/2015 0040   AST 31 09/11/2015 0040   ALT 36 09/11/2015 0040   ALKPHOS 70 09/11/2015 0040   BILITOT 1.1 09/11/2015 0040   GFRNONAA 59* 09/13/2015 0641   GFRNONAA 76 06/23/2015 0912   GFRAA >60 09/13/2015 0641   GFRAA 88 06/23/2015 0912    GFR Estimated Creatinine Clearance: 75.1 mL/min (by C-G formula based on Cr of 1.26). No results for input(s): LIPASE, AMYLASE in the last 72 hours.  Recent Labs  09/11/15 0040 09/11/15 1125  TROPONINI 0.39* 0.35*   Invalid input(s): POCBNP  Recent Labs  09/11/15 0040  DDIMER 0.31   No results for input(s): HGBA1C in the last 72 hours. No results for input(s): CHOL, HDL, LDLCALC, TRIG,  CHOLHDL, LDLDIRECT in the last 72 hours. No results for input(s): TSH, T4TOTAL, T3FREE, THYROIDAB in the last 72 hours.  Invalid input(s): FREET3 No results for input(s): VITAMINB12, FOLATE, FERRITIN, TIBC, IRON, RETICCTPCT in the last 72 hours. Coags:  Recent Labs  09/12/15 0505  INR 1.06   Microbiology: Recent Results (from the past 240 hour(s))  MRSA PCR Screening     Status: None   Collection Time: 09/11/15 12:20 AM  Result Value Ref Range Status   MRSA by PCR NEGATIVE NEGATIVE Final    Comment:        The GeneXpert MRSA Assay (FDA approved for NASAL specimens only), is one component of a comprehensive MRSA colonization surveillance program. It is not intended to diagnose MRSA infection nor to guide or monitor treatment  for MRSA infections.   Culture, sputum-assessment     Status: None   Collection Time: 09/12/15  6:03 AM  Result Value Ref Range Status   Specimen Description SPUTUM  Final   Special Requests NONE  Final   Sputum evaluation   Final    THIS SPECIMEN IS ACCEPTABLE. RESPIRATORY CULTURE REPORT TO FOLLOW.   Report Status 09/12/2015 FINAL  Final  Culture, respiratory (NON-Expectorated)     Status: None (Preliminary result)   Collection Time: 09/12/15  6:03 AM  Result Value Ref Range Status   Specimen Description SPUTUM  Final   Special Requests NONE  Final   Gram Stain   Final    ABUNDANT WBC PRESENT,BOTH PMN AND MONONUCLEAR FEW SQUAMOUS EPITHELIAL CELLS PRESENT MODERATE GRAM POSITIVE COCCI IN PAIRS IN CLUSTERS THIS SPECIMEN IS ACCEPTABLE FOR SPUTUM CULTURE Performed at [facility]    Culture   Final    Culture reincubated for better growth Performed at [facility]    Report Status PENDING  Incomplete     BRIEF HOSPITAL COURSE:  NSTEMI:  IV Heparin,ASA and statin, and Metoprolol, Cards consulted-underwent for LHC 4/24-which showed Ramus 95% stenosis and 1st diagonal 90 % stenosis. Cards recommending med management first-if symp  persists then will need PCI.Started on ASA/Plavix,Statin, Beta Blocker and Imdur  Active Problems: ?Bronchitis: Chest x-ray did show a vague area of ill-defined infiltrate in the right midlung-patient did not have any signs of pneumonia. Although he was started on empiric antibiotics on admission, and this was discontinued. Patient remains afebrile, without cough or shortness of breath. Doubt need for further antibiotics. However will need a repeat chest x-ray in 2-4 weeks to see if this vague area in the right lung resolves, if not patient would need a CT of the chest. This was explained to both patient and the patient's wife, will discuss with PCP at the next follow-up visit  HTN:  blood pressure soft after addition of Imdur. Will discontinue losartan and just maintain on metoprolol and Imdur. HCTZ has been discontinued.   AKI: likely secondary to mild hypertension and better blood pressure control. Also had cardiac catheterization yesterday. For now we will discontinue losartan, continue to hold HCTZ. Will require a repeat chemistry panel at next visit with PCP and cardiology.   Dyslipidemia:continue Statin  GERD: PPI  Chronic back pain:Continue Percocet and Neurontin   TODAY-DAY OF DISCHARGE:  Subjective:   Bradly Bozzuto today has no headache,no chest abdominal pain,no new weakness tingling or numbness, feels much better wants to go home today.  Objective:   Blood pressure 96/56, pulse 71, temperature 98.1 F (36.7 C), temperature source Oral, resp. rate 15, height 6' (1.829 m), weight 102.059 kg (225 lb), SpO2 95 %.  Intake/Output Summary (Last 24 hours) at 09/13/15 1357 Last data filed at 09/13/15 1300  Gross per 24 hour  Intake    720 ml  Output   1375 ml  Net   -655 ml   Filed Weights   09/11/15 0014 09/12/15 0438 09/13/15 0600  Weight: 102.422 kg (225 lb 12.8 oz) 100.336 kg (221 lb 3.2 oz) 102.059 kg (225 lb)    Exam Awake Alert, Oriented *3, No new F.N deficits,  Normal affect [place].AT,PERRAL Supple Neck,No JVD, No cervical lymphadenopathy appriciated.  Symmetrical Chest wall movement, Good air movement bilaterally, CTAB RRR,No Gallops,Rubs or new Murmurs, No Parasternal Heave +ve B.Sounds, Abd Soft, Non tender, No organomegaly appriciated, No rebound -guarding or rigidity. No Cyanosis, Clubbing or edema, No  new Rash or bruise  DISCHARGE CONDITION: Stable  DISPOSITION: Home  DISCHARGE INSTRUCTIONS:    Activity:  As tolerated   Get Medicines reviewed and adjusted: Please take all your medications with you for your next visit with your Primary MD  Please request your Primary MD to go over all hospital tests and procedure/radiological results at the follow up, please ask your Primary MD to get all Hospital records sent to his/her office.  If you experience worsening of your admission symptoms, develop shortness of breath, life threatening emergency, suicidal or homicidal thoughts you must seek medical attention immediately by calling 911 or calling your MD immediately  if symptoms less severe.  You must read complete instructions/literature along with all the possible adverse reactions/side effects for all the Medicines you take and that have been prescribed to you. Take any new Medicines after you have completely understood and accpet all the possible adverse reactions/side effects.   Do not drive when taking Pain medications.   Do not take more than prescribed Pain, Sleep and Anxiety Medications  Special Instructions: If you have smoked or chewed Tobacco  in the last 2 yrs please stop smoking, stop any regular Alcohol  and or any Recreational drug use.  Wear Seat belts while driving.  Please note  You were cared for by a hospitalist during your hospital stay. Once you are discharged, your primary care physician will handle any further medical issues. Please note that NO REFILLS for any discharge medications will be authorized once you are  discharged, as it is imperative that you return to your primary care physician (or establish a relationship with a primary care physician if you do not have one) for your aftercare needs so that they can reassess your need for medications and monitor your lab values.   Diet recommendation: Heart Healthy diet  Discharge Instructions    Call MD for:  persistant nausea and vomiting    Complete by:  As directed      Call MD for:  redness, tenderness, or signs of infection (pain, swelling, redness, odor or green/yellow discharge around incision site)    Complete by:  As directed      Call MD for:  severe uncontrolled pain    Complete by:  As directed      Diet - low sodium heart healthy    Complete by:  As directed      Increase activity slowly    Complete by:  As directed            Follow-up Information    Follow up with [facility] R, NP On 09/26/2015.   Specialties:  Cardiology, Radiology   Why:  Cardiology Hospital Follow-Up on 09/26/2015 at 3:00 PM.    Contact information:   [address] [place] [address] [phone]       Follow up with [name], MD. Go on 09/20/2015.   Specialty:  Internal Medicine   Why:  @12 :00 for hospital follow-up   Contact information:   [address] [id number] [phone]       Total Time spent on discharge equals 25  minutes.  SignedOren Binet 09/13/2015 1:57 PM

## 2015-09-14 LAB — CULTURE, RESPIRATORY

## 2015-09-14 LAB — CULTURE, RESPIRATORY W GRAM STAIN: Culture: NORMAL

## 2015-09-14 NOTE — Telephone Encounter (Signed)
Patient contacted regarding discharge from [facility] on April 25,2017.Marland Kitchen Pt is resting.  I spoke with his wife.   Patient understands to follow up with provider  -Yes.  Cecilie Kicks, NP on May 8,2017 at 3:00 Patient understands discharge instructions?  yes Patient understands medications and regiment? yes Patient understands to bring all medications to this visit? yes

## 2015-09-19 ENCOUNTER — Telehealth: Payer: Self-pay | Admitting: Cardiovascular Disease

## 2015-09-19 ENCOUNTER — Telehealth: Payer: Self-pay | Admitting: Cardiology

## 2015-09-19 NOTE — Telephone Encounter (Signed)
New message   Pt wife is calling to speak to [facility]   She wants to talk to her about the Imdur 30mg  medication that the hospital doctors put him on   It is causing him headaches   Please call pt and advise  Pt c/o medication issue:  1. Name of Medication: Imdur  2. How are you currently taking this medication (dosage and times per day)? 30mg   3. Are you having a reaction (difficulty breathing--STAT)? no  4. What is your medication issue? headaches

## 2015-09-19 NOTE — Telephone Encounter (Signed)
Spoke with pt wife, she reports he has been on the isosorbide long enough for the headaches to have started to calm down. He is getting the headaches daily, asa nor tylenol help ease the headache. They are asking for an alternative medication to take without the headache side effect. Aware laura ingold is not in the office today but will forward for her review

## 2015-09-19 NOTE — Telephone Encounter (Signed)
error 

## 2015-09-20 ENCOUNTER — Ambulatory Visit (INDEPENDENT_AMBULATORY_CARE_PROVIDER_SITE_OTHER): Payer: Commercial Managed Care - HMO | Admitting: Internal Medicine

## 2015-09-20 ENCOUNTER — Ambulatory Visit
Admission: RE | Admit: 2015-09-20 | Discharge: 2015-09-20 | Disposition: A | Discharge status: Home / Self Care | Payer: 59 | Source: Ambulatory Visit | Attending: Internal Medicine | Admitting: Internal Medicine

## 2015-09-20 ENCOUNTER — Encounter: Payer: Self-pay | Admitting: Internal Medicine

## 2015-09-20 VITALS — BP 112/68 | HR 78 | Temp 98.7°F | Resp 18 | Ht 72.0 in | Wt 225.5 lb

## 2015-09-20 DIAGNOSIS — I222 Subsequent non-ST elevation (NSTEMI) myocardial infarction: Secondary | ICD-10-CM

## 2015-09-20 DIAGNOSIS — J069 Acute upper respiratory infection, unspecified: Secondary | ICD-10-CM

## 2015-09-20 DIAGNOSIS — E669 Obesity, unspecified: Secondary | ICD-10-CM

## 2015-09-20 DIAGNOSIS — I10 Essential (primary) hypertension: Secondary | ICD-10-CM

## 2015-09-20 DIAGNOSIS — R911 Solitary pulmonary nodule: Secondary | ICD-10-CM

## 2015-09-20 DIAGNOSIS — I214 Non-ST elevation (NSTEMI) myocardial infarction: Secondary | ICD-10-CM | POA: Diagnosis not present

## 2015-09-20 DIAGNOSIS — Z029 Encounter for administrative examinations, unspecified: Secondary | ICD-10-CM

## 2015-09-20 DIAGNOSIS — E785 Hyperlipidemia, unspecified: Secondary | ICD-10-CM | POA: Diagnosis not present

## 2015-09-20 LAB — CBC WITH DIFFERENTIAL/PLATELET
Basophils Absolute: 0 cells/uL (ref 0–200)
Basophils Relative: 0 %
EOS PCT: 1 %
Eosinophils Absolute: 106 cells/uL (ref 15–500)
HCT: 40.7 % (ref 38.5–50.0)
HEMOGLOBIN: 14.1 g/dL (ref 13.2–17.1)
LYMPHS ABS: 2014 {cells}/uL (ref 850–3900)
Lymphocytes Relative: 19 %
MCH: 33 pg (ref 27.0–33.0)
MCHC: 34.6 g/dL (ref 32.0–36.0)
MCV: 95.3 fL (ref 80.0–100.0)
MPV: 9 fL (ref 7.5–12.5)
Monocytes Absolute: 1590 cells/uL — ABNORMAL HIGH (ref 200–950)
Monocytes Relative: 15 %
NEUTROS PCT: 65 %
Neutro Abs: 6890 cells/uL (ref 1500–7800)
PLATELETS: 306 10*3/uL (ref 140–400)
RBC: 4.27 MIL/uL (ref 4.20–5.80)
RDW: 13.4 % (ref 11.0–15.0)
WBC: 10.6 10*3/uL (ref 3.8–10.8)

## 2015-09-20 MED ORDER — CLARITHROMYCIN 500 MG PO TABS: 500.0000 mg | ORAL_TABLET | Freq: Two times a day (BID) | ORAL | Status: DC

## 2015-09-20 NOTE — Patient Instructions (Addendum)
FMLA paperwork completed for work. Call cardiology office about Imdur causing headache. Biaxin 500 mg twice daily for 10 days. Have repeat chest x-ray today. Return in 2 weeks. We will arrange for pulmonary consultation regarding lung nodule.

## 2015-09-20 NOTE — Progress Notes (Signed)
   Subjective:    Patient ID: Johnny [name], male    DOB: [birth year], 65 y.o.   MRN: [id number]  HPI  Recently admitted with NSTEMI and had cath by Dr. Gwenlyn Found. Report reviewed.Had 50% mid LAD, 95% Ramus and 90% First diagonal. Was placed on medical management including beta blocker, Imdur and Crestor. 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 Says he has had recent respiratory infection with abnormal CXR. Chest x-ray on admission raised question of pneumonitis. He was placed empirically on antibiotics at the time of admission but these were discontinued. Says he has discolored sputum production and slight sore throat. Wife says he's had congested cough. We will repeat chest x-ray today. He also has a nodule on CT 5 mm in right middle lobe that was discovered  on renal stone study June 2016. He has a remote history of smoking. We were considering repeating his CT in June for stability. Chest x-ray April 22 showed right mid lungs own atelectasis/opacity. However, wife is quite concerned about these lung issues and we will refer him to pulmonologist for further evaluation at this point. They bring in FMLA papers to be completed. Patient says he would like to be out of work for 12 weeks to give himself time to adjust to diet and exercise regimen. 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 Wife has MS and is disabled.    Review of Systems see above. Complaining of headache with Imdur. Says dose was recently decreased from 30 mg to one half of the 30 mg tablet and he still has headache. He may need contact cardiology office regarding this.     Objective:   Physical Exam  Skin warm and dry. Nodes none. TMs and pharynx are clear. Neck is supple. Chest clear to auscultation without rales wheezing or rhonchi. Cardiac exam regular rate and rhythm normal S1 and S2. Extremities without edema.      Assessment & Plan:  Recent NSTEMI-has appointment to be seen in cardiology office May 8.  Requesting FMLA papers be completed. This was done today. Noncritical stenosis in LAD being treated medically  Obesity-needs to be serious about diet and exercise.  Hyperlipidemia-continue Crestor. We had planned several months ago for follow-up later this month. Triglycerides were in the 300 range in February. We'll also screen for diabetes at next lab draw.  Acute URI-treat with Biaxin 500 mg twice daily for 10 days. Follow-up in 2 weeks.  Headache on Imdur  5 mm nodule on chest CT right middle lobe. Refer to pulmonologist. Patient and wife seem worried about this.  Attention deficit disorder-I think he probably should discontinue Adderall with history of NSTEMI  Plan: We will repeat chest x-ray in follow-up of 1 from April 22 to see if ill-defined opacity is still present. Groin treated with Biaxin 500 mg by mouth twice daily for 10 days. He will call cardiology office about headache on Imdur. I will see him again in 2 weeks.

## 2015-09-22 ENCOUNTER — Ambulatory Visit (INDEPENDENT_AMBULATORY_CARE_PROVIDER_SITE_OTHER): Payer: Commercial Managed Care - HMO | Admitting: Pulmonary Disease

## 2015-09-22 ENCOUNTER — Encounter: Payer: Self-pay | Admitting: Pulmonary Disease

## 2015-09-22 VITALS — BP 100/70 | HR 72 | Ht 72.0 in | Wt 223.8 lb

## 2015-09-22 DIAGNOSIS — J189 Pneumonia, unspecified organism: Secondary | ICD-10-CM

## 2015-09-22 DIAGNOSIS — R0600 Dyspnea, unspecified: Secondary | ICD-10-CM | POA: Diagnosis not present

## 2015-09-22 DIAGNOSIS — R0609 Other forms of dyspnea: Secondary | ICD-10-CM | POA: Diagnosis not present

## 2015-09-22 DIAGNOSIS — R911 Solitary pulmonary nodule: Secondary | ICD-10-CM

## 2015-09-22 DIAGNOSIS — R05 Cough: Secondary | ICD-10-CM

## 2015-09-22 DIAGNOSIS — R059 Cough, unspecified: Secondary | ICD-10-CM

## 2015-09-22 NOTE — Patient Instructions (Signed)
1. We will hold off on breathing meds unless your breathing test is positive or you get more short of breath. 2. We will schedule you for a breathing test and chest ct scan. We will call you with results of tests.  3. Finish off antibiotics.  Return to clinic in 2-3 mos.

## 2015-09-22 NOTE — Progress Notes (Addendum)
Subjective:    Patient ID: Johnny [name], male    DOB: 12/29/1950, 65 y.o.   MRN: [id number]  HPI   This is the case of Johnny [name], 65 y.o. Male, who was referred by Dr. Tedra Senegal  in consultation regarding SOB.   As you very well know, patient smoked cigarettes as a teenager.  Has not been dxed with asthma or copd. He has been SOB for several months now. Recently, he had cp associated with exertion. He was admitted and had cardiac work up. He had CAD and plan was to medically manage pt.  No stents were placed.  He is better with SOB.  He takes it slowly still.   Recent PNA in RML/RLL. Received abx. Pt also had persistent cough so he is currently on Abx.   Over all better since having cp.    Review of Systems  Constitutional: Negative.   HENT: Positive for congestion.   Eyes: Negative.   Respiratory: Positive for cough and shortness of breath. Negative for wheezing and stridor.   Cardiovascular: Negative.   Gastrointestinal: Negative.   Endocrine: Negative.   Genitourinary: Negative.   Musculoskeletal: Negative.   Skin: Negative.   Allergic/Immunologic: Negative.   Neurological: Negative.   Hematological: Negative.   Psychiatric/Behavioral: Negative.   All other systems reviewed and are negative.  Past Medical History  Diagnosis Date  . Hyperlipidemia   . Hypertension   . GERD (gastroesophageal reflux disease)   . Osteopenia   . Internal hemorrhoids   . Adenomatous polyps   . Low testosterone   . Vitamin D deficiency   . Chronic back pain   . TIA (transient ischemic attack) 2008  . Shortness of breath dyspnea   . Bronchitis   . Ventral hernia   . Spondylosis    (-) CVA, DVT, CA  Family History  Problem Relation Age of Onset  . Heart disease Mother   . Stroke Mother   . Heart attack Mother   . Emphysema Father      Past Surgical History  Procedure Laterality Date  . Back surgery      2 surgeries  . Cardiac catheterization N/A 09/12/2015      Procedure: Left Heart Cath and Coronary Angiography;  Surgeon: Lorretta Harp, MD;  Location: [facility];  Service: Cardiovascular;  Laterality: N/A;    Social History   Social History  . Marital Status: Married    Spouse Name: N/A  . Number of Children: 4  . Years of Education: 14   Occupational History  . Transfer driver    Social History Main Topics  . Smoking status: Former Smoker -- 10 years    Types: Cigarettes    Quit date: 01/04/1978  . Smokeless tobacco: Never Used  . Alcohol Use: No     Comment: rarely  . Drug Use: No  . Sexual Activity: Not on file   Other Topics Concern  . Not on file   Social History Narrative   Lives at home with his wife.   Right-handed.   2 cups caffeine per day.     Allergies  Allergen Reactions  . Atorvastatin Other (See Comments)    Joint pain  . Crestor [Rosuvastatin Calcium] Other (See Comments)    Joint pain  . Levaquin [Levofloxacin In D5w] Nausea And Vomiting  . Metadate Cd [Methylphenidate Hcl Er (Cd)] Other (See Comments)    Burning, GI upset, bloating  . Morphine And Related Nausea  And Vomiting  . Hydrocodone Nausea Only    Can take with food     Outpatient Prescriptions Prior to Visit  Medication Sig Dispense Refill  . clarithromycin (BIAXIN) 500 MG tablet Take 1 tablet (500 mg total) by mouth 2 (two) times daily. 20 tablet 0  . clopidogrel (PLAVIX) 75 MG tablet Take 1 tablet (75 mg total) by mouth daily. 30 tablet 0  . gabapentin (NEURONTIN) 300 MG capsule Take 300 mg by mouth at bedtime.    . isosorbide mononitrate (IMDUR) 30 MG 24 hr tablet Take 1 tablet (30 mg total) by mouth daily. 30 tablet 0  . metoprolol tartrate (LOPRESSOR) 25 MG tablet Take 1 tablet (25 mg total) by mouth 2 (two) times daily. 60 tablet 0  . Multiple Vitamin (MULTIVITAMIN) tablet Take 1 tablet by mouth daily.    . nitroGLYCERIN (NITROSTAT) 0.4 MG SL tablet Place 1 tablet (0.4 mg total) under the tongue every 5 (five) minutes as  needed for chest pain. 30 tablet 0  . oxyCODONE-acetaminophen (PERCOCET) 10-325 MG tablet Take 2 tablets by mouth every 6 (six) hours as needed for pain. Patient gets #180 pills monthly from Dr. Carloyn Manner, last filled February 6.    . rosuvastatin (CRESTOR) 20 MG tablet Take 20 mg by mouth daily.    Marland Kitchen aspirin 325 MG tablet Take 325 mg by mouth daily.     No facility-administered medications prior to visit.   Meds ordered this encounter  Medications  . aspirin EC 81 MG tablet    Sig: Take 81 mg by mouth daily.              Objective:   Physical Exam   Vitals:  Filed Vitals:   09/22/15 1652  BP: 100/70  Pulse: 72  Height: 6' (1.829 m)  Weight: 223 lb 12.8 oz (101.515 kg)  SpO2: 97%    Constitutional/General:  Pleasant, well-nourished, well-developed, not in any distress,  Comfortably seating.  Well kempt  Body mass index is 30.35 kg/(m^2). Wt Readings from Last 3 Encounters:  09/22/15 223 lb 12.8 oz (101.515 kg)  09/20/15 225 lb 8 oz (102.286 kg)  09/13/15 225 lb (102.059 kg)    HEENT: Pupils equal and reactive to light and accommodation. Anicteric sclerae. Normal nasal mucosa.   No oral  lesions,  mouth clear,  oropharynx clear, no postnasal drip. (-) Oral thrush. No dental caries.  Airway - Mallampati class III  Neck: No masses. Midline trachea. No JVD, (-) LAD. (-) bruits appreciated.  Respiratory/Chest: Grossly normal chest. (-) deformity. (-) Accessory muscle use.  Symmetric expansion. (-) Tenderness on palpation.  Resonant on percussion.  Diminished BS on both lower lung zones. (-) crackles, rhonchi. Occasional wheezing.  (-) egophony  Cardiovascular: Regular rate and  rhythm, heart sounds normal, no murmur or gallops, no peripheral edema  Gastrointestinal:  Normal bowel sounds. Soft, non-tender. No hepatosplenomegaly.  (-) masses.   Musculoskeletal:  Normal muscle tone. Normal gait.   Extremities: Grossly normal. (-) clubbing, cyanosis.  (-)  edema  Skin: (-) rash,lesions seen.   Neurological/Psychiatric : alert, oriented to time, place, person. Normal mood and affect           Assessment & Plan:  Exertional dyspnea Non smoker. Recent exertional dyspnea.  Being managed for CAD medically. No stents.  Better over all but remains SOB. Not sure if there is COPD on top of CAD.  Plan : 1. PFT. Will need to call pt if abn > likely will need  spiriva or symbicort and will need alpha one.  2. May need abg on f/u.   CAP (community acquired pneumonia) Pt with ? PNA. Had PNA in 07/2015. CXR with RML/RLL infiltrate. Rpt CXR was N (09/2015). Pt however has more cough, productive. Will observe. Denies GERD, UACS.  Not sure if aspirating.   Lung nodule abd ct scan (10/2014)  5 mm RML lung nodule. Needs a dedicated chest ct scan.   Cough Recent bronchitis. Finishing off abx. May need to rx GERD/UACS if not better.     Thank you very much for letting me participate in this patient's care. Please do not hesitate to give me a call if you have any questions or concerns regarding the treatment plan.   Patient will follow up with me in August, sooner if with more sx.    I personally reviewed previous images (Chest Xray, Chest Ct scan) done on this patient. I reviewed the reports on the images as well.     Monica Becton, MD 09/23/2015   5:21 AM Pulmonary and [place] Pager: [phone] Office: [phone], Fax: [phone]

## 2015-09-23 DIAGNOSIS — R911 Solitary pulmonary nodule: Secondary | ICD-10-CM | POA: Insufficient documentation

## 2015-09-23 DIAGNOSIS — R059 Cough, unspecified: Secondary | ICD-10-CM | POA: Insufficient documentation

## 2015-09-23 DIAGNOSIS — J189 Pneumonia, unspecified organism: Secondary | ICD-10-CM | POA: Insufficient documentation

## 2015-09-23 DIAGNOSIS — R05 Cough: Secondary | ICD-10-CM | POA: Insufficient documentation

## 2015-09-23 DIAGNOSIS — R0609 Other forms of dyspnea: Secondary | ICD-10-CM

## 2015-09-23 DIAGNOSIS — R0600 Dyspnea, unspecified: Secondary | ICD-10-CM | POA: Insufficient documentation

## 2015-09-23 NOTE — Assessment & Plan Note (Signed)
Pt with ? PNA. Had PNA in 07/2015. CXR with RML/RLL infiltrate. Rpt CXR was N (09/2015). Pt however has more cough, productive. Will observe. Denies GERD, UACS.  Not sure if aspirating.

## 2015-09-23 NOTE — Assessment & Plan Note (Signed)
Recent bronchitis. Finishing off abx. May need to rx GERD/UACS if not better.

## 2015-09-23 NOTE — Assessment & Plan Note (Signed)
abd ct scan (10/2014)  5 mm RML lung nodule. Needs a dedicated chest ct scan.

## 2015-09-23 NOTE — Assessment & Plan Note (Signed)
Non smoker. Recent exertional dyspnea.  Being managed for CAD medically. No stents.  Better over all but remains SOB. Not sure if there is COPD on top of CAD.  Plan : 1. PFT. Will need to call pt if abn > likely will need spiriva or symbicort and will need alpha one.  2. May need abg on f/u.

## 2015-09-26 ENCOUNTER — Encounter: Payer: Self-pay | Admitting: Cardiology

## 2015-09-26 ENCOUNTER — Ambulatory Visit (INDEPENDENT_AMBULATORY_CARE_PROVIDER_SITE_OTHER): Payer: Commercial Managed Care - HMO | Admitting: Cardiology

## 2015-09-26 ENCOUNTER — Ambulatory Visit (INDEPENDENT_AMBULATORY_CARE_PROVIDER_SITE_OTHER)
Admission: RE | Admit: 2015-09-26 | Discharge: 2015-09-26 | Disposition: A | Discharge status: Home / Self Care | Payer: Commercial Managed Care - HMO | Source: Ambulatory Visit | Attending: Pulmonary Disease | Admitting: Pulmonary Disease

## 2015-09-26 VITALS — BP 108/68 | HR 70 | Ht 72.0 in | Wt 226.8 lb

## 2015-09-26 DIAGNOSIS — R911 Solitary pulmonary nodule: Secondary | ICD-10-CM

## 2015-09-26 DIAGNOSIS — I2510 Atherosclerotic heart disease of native coronary artery without angina pectoris: Secondary | ICD-10-CM

## 2015-09-26 DIAGNOSIS — I2583 Coronary atherosclerosis due to lipid rich plaque: Principal | ICD-10-CM

## 2015-09-26 MED ORDER — PRAVASTATIN SODIUM 10 MG PO TABS: 10.0000 mg | ORAL_TABLET | ORAL | Status: DC

## 2015-09-26 MED ORDER — NEBIVOLOL HCL 5 MG PO TABS: 5.0000 mg | ORAL_TABLET | Freq: Every day | ORAL | Status: DC

## 2015-09-26 MED ORDER — RANOLAZINE ER 500 MG PO TB12: 500.0000 mg | ORAL_TABLET | Freq: Two times a day (BID) | ORAL | Status: DC

## 2015-09-26 NOTE — Patient Instructions (Signed)
**Note De-Identified  Obfuscation** Medication Instructions:  Stop taking Crestor, Imdur and Lopressor. Start taking Ranexa 500 mg twice daily, Pravastatin 10 mg every other day and Bystolic 5 mg daily. All of your other medications remain the same.  Labwork: None  Testing/Procedures: None  Follow-Up: In 2 to 3 weeks with Dr [name]     If you need a refill on your cardiac medications before your next appointment, please call your pharmacy.

## 2015-09-26 NOTE — Progress Notes (Signed)
Cardiology Office Note   Date:  09/26/2015   ID:  Johnny [name], DOB 07-08-1950, MRN [id number]  PCP:  Johnny Showers, MD  Cardiologist:  Dr. Johnsie [name]    No chief complaint on file.     History of Present Illness: Johnny [name] is a 65 y.o. male who presents for post hospitalization 09/10/15 - 09/13/15 for chest pain.   He has a history of HTN, HLD, ? Prior TIA and GERD, but no known h/o CAD, admitted for CP with + troponin.   Per Johnny [name] "mr. Johnny [name] has branch vessel disease and a high marginal branch and a first diagonal branch. These are small vessels in diameter though they are fairly long in length. He has moderate segmental mid LAD disease as well. At this point, I would recommend optimizing his antianginal medications. If he has continued chest pain he would be a candidate for angioplasty of his diagonal branch and marginal branch. " pk troponin 0.39.   Today he has no pain Discussed issues of statin, and nitrate intolerance. Need to try to advance medical Rx to avoid angioplasty/stent  Past Medical History  Diagnosis Date  . Hyperlipidemia   . Hypertension   . GERD (gastroesophageal reflux disease)   . Osteopenia   . Internal hemorrhoids   . Adenomatous polyps   . Low testosterone   . Vitamin D deficiency   . Chronic back pain   . TIA (transient ischemic attack) 2008  . Shortness of breath dyspnea   . Bronchitis   . Ventral hernia   . Spondylosis     Past Surgical History  Procedure Laterality Date  . Back surgery      2 surgeries  . Cardiac catheterization N/A 09/12/2015    Procedure: Left Heart Cath and Coronary Angiography;  Surgeon: Johnny Harp, MD;  Location: [facility];  Service: Cardiovascular;  Laterality: N/A;     Current Outpatient Prescriptions  Medication Sig Dispense Refill  . aspirin EC 81 MG tablet Take 81 mg by mouth daily.    . clarithromycin (BIAXIN) 500 MG tablet Take 1 tablet (500 mg total) by mouth 2 (two) times daily.  20 tablet 0  . clopidogrel (PLAVIX) 75 MG tablet Take 1 tablet (75 mg total) by mouth daily. 30 tablet 0  . gabapentin (NEURONTIN) 300 MG capsule Take 300 mg by mouth at bedtime.    . Multiple Vitamin (MULTIVITAMIN) tablet Take 1 tablet by mouth daily.    . nitroGLYCERIN (NITROSTAT) 0.4 MG SL tablet Place 1 tablet (0.4 mg total) under the tongue every 5 (five) minutes as needed for chest pain. 30 tablet 0  . oxyCODONE-acetaminophen (PERCOCET) 10-325 MG tablet Take 2 tablets by mouth every 6 (six) hours as needed for pain. Patient gets #180 pills monthly from Dr. Carloyn [name], last filled February 6.    . nebivolol (BYSTOLIC) 5 MG tablet Take 1 tablet (5 mg total) by mouth daily. 30 tablet 3  . pravastatin (PRAVACHOL) 10 MG tablet Take 1 tablet (10 mg total) by mouth every other day. 15 tablet 3  . ranolazine (RANEXA) 500 MG 12 hr tablet Take 1 tablet (500 mg total) by mouth 2 (two) times daily. 60 tablet 3   No current facility-administered medications for this visit.    Allergies:   Atorvastatin; Crestor; Levaquin; Metadate cd; Morphine and related; and Hydrocodone    Social History:  The patient  reports that he quit smoking about 37 years ago. His smoking use included  Cigarettes. He quit after 10 years of use. He has never used smokeless tobacco. He reports that he does not drink alcohol or use illicit drugs.   Family History:  The patient's family history includes Emphysema in his father; Heart attack in his mother; Heart disease in his mother; Stroke in his mother.    ROS:  General:no colds or fevers, no weight changes Skin:no rashes or ulcers HEENT:no blurred vision, no congestion CV:see HPI PUL:see HPI GI:no diarrhea constipation or melena, no indigestion GU:no hematuria, no dysuria MS:no joint pain, no claudication Neuro:no syncope, no lightheadedness Endo:no diabetes, no thyroid disease Wt Readings from Last 3 Encounters:  09/26/15 102.876 kg (226 lb 12.8 oz)  09/22/15 101.515 kg  (223 lb 12.8 oz)  09/20/15 102.286 kg (225 lb 8 oz)     PHYSICAL EXAM: VS:  BP 108/68 mmHg  Pulse 70  Ht 6' (1.829 m)  Wt 102.876 kg (226 lb 12.8 oz)  BMI 30.75 kg/m2 , BMI Body mass index is 30.75 kg/(m^2). General:Pleasant affect, NAD Skin:Warm and dry, brisk capillary refill HEENT:normocephalic, sclera clear, mucus membranes moist Neck:supple, no JVD, no bruits  Heart:S1S2 RRR without murmur, gallup, rub or click Lungs:clear without rales, rhonchi, or wheezes [id number], non tender, + BS, do not palpate liver spleen or masses Ext:no lower ext edema, 2+ pedal pulses, 2+ radial pulses Neuro:alert and oriented, MAE, follows commands, + facial symmetry    EKG:  09/11/15 SR rate 86 old IMI    Recent Labs: 09/11/2015: ALT 36 09/13/2015: BUN 13; Creatinine, Ser 1.26*; Potassium 4.4; Sodium 140 09/20/2015: Hemoglobin 14.1; Platelets 306    Lipid Panel    Component Value Date/Time   CHOL 216* 06/23/2015 0912   TRIG 307* 06/23/2015 0912   HDL 44 06/23/2015 0912   CHOLHDL 4.9 06/23/2015 0912   VLDL 61* 06/23/2015 0912   LDLCALC 111 06/23/2015 0912       Other studies Reviewed: Additional studies/ records that were reviewed today include: cath films .   ASSESSMENT AND PLAN:  1.  CAD: clinically stable  Will change beta blocker to bystolic, and stop nitrates due to inducing more migraines start ranexa 500 bid F/u with me next available to see if medical rx tolerated 2. Chol:  Target LDL 70 or less try pravastatin 10 mg qod given history of muscle weakness 3. TIA: no carotid disease or PAF continue DAT given concomitant CAD   Current medicines are reviewed with the patient today.  The patient Has no concerns regarding medicines.  The following changes have been made:  See above Labs/ tests ordered today include:see above  Disposition:   FU:  With me next available   Johnny [name]

## 2015-09-27 ENCOUNTER — Other Ambulatory Visit: Payer: Self-pay | Admitting: Internal Medicine

## 2015-09-27 ENCOUNTER — Telehealth: Payer: Self-pay | Admitting: Cardiovascular Disease

## 2015-09-27 ENCOUNTER — Telehealth: Payer: Self-pay | Admitting: *Deleted

## 2015-09-27 DIAGNOSIS — R911 Solitary pulmonary nodule: Secondary | ICD-10-CM

## 2015-09-27 NOTE — Telephone Encounter (Signed)
pt had a question about drug interaction, ranexa & clarithromycin, verified with Jinny Blossom PharmD that he needs to take all anitbiotic then start the new medication ranexa. pts wife is agreement to plan.

## 2015-09-27 NOTE — Telephone Encounter (Signed)
Called Dr. Franklyn Lor office, letting Stanton Kidney know of Dr. Kyla Balzarine advisement. Mary at [facility] will let the doctor know.

## 2015-09-27 NOTE — Telephone Encounter (Signed)
NO would not take adderal with his CAD Can consider non stimulant med like Mining engineer

## 2015-09-27 NOTE — Telephone Encounter (Signed)
Patient was on this medication (Methylphenidate 20 mg for ADHD) , but has been off of it for a while (not sure time frame). Mary at [facility] wants to make sure it is okay to prescribe. Will forward to Dr. [name] for advisement.

## 2015-09-27 NOTE — Telephone Encounter (Signed)
New message      Calling to see if it is ok for pt to take methylphenidate 20mg  daily for ADHD

## 2015-09-28 NOTE — Telephone Encounter (Signed)
We saw pt in clinic and stopped and changed meds.  thanks

## 2015-09-29 ENCOUNTER — Other Ambulatory Visit: Payer: Self-pay | Admitting: Cardiovascular Disease

## 2015-10-04 ENCOUNTER — Ambulatory Visit: Payer: Commercial Managed Care - HMO | Admitting: Internal Medicine

## 2015-10-06 ENCOUNTER — Telehealth: Payer: Self-pay

## 2015-10-06 ENCOUNTER — Other Ambulatory Visit: Payer: Commercial Managed Care - HMO | Admitting: Internal Medicine

## 2015-10-06 DIAGNOSIS — E785 Hyperlipidemia, unspecified: Secondary | ICD-10-CM

## 2015-10-06 DIAGNOSIS — E119 Type 2 diabetes mellitus without complications: Secondary | ICD-10-CM

## 2015-10-06 DIAGNOSIS — Z79899 Other long term (current) drug therapy: Secondary | ICD-10-CM

## 2015-10-06 LAB — HEMOGLOBIN A1C
Hgb A1c MFr Bld: 5.5 % (ref <5.7)
Mean Plasma Glucose: 111 mg/dL

## 2015-10-06 LAB — LIPID PANEL
CHOLESTEROL: 186 mg/dL (ref 125–200)
HDL: 37 mg/dL — ABNORMAL LOW (ref >=40)
LDL CALC: 88 mg/dL (ref <130)
TRIGLYCERIDES: 303 mg/dL — AB (ref <150)
Total CHOL/HDL Ratio: 5 Ratio (ref <=5.0)
VLDL: 61 mg/dL — ABNORMAL HIGH (ref <30)

## 2015-10-06 LAB — HEPATIC FUNCTION PANEL
ALK PHOS: 56 U/L (ref 40–115)
ALT: 26 U/L (ref 9–46)
AST: 19 U/L (ref 10–35)
Albumin: 4.3 g/dL (ref 3.6–5.1)
BILIRUBIN INDIRECT: 0.8 mg/dL (ref 0.2–1.2)
Bilirubin, Direct: 0.1 mg/dL (ref <=0.2)
TOTAL PROTEIN: 6.6 g/dL (ref 6.1–8.1)
Total Bilirubin: 0.9 mg/dL (ref 0.2–1.2)

## 2015-10-06 NOTE — Telephone Encounter (Signed)
Patient spouse contacted office and he wants to know if he can take Co-Q 10 and a calcium-magnesium-vitamin D with his current medication regimen. Please advise.

## 2015-10-06 NOTE — Telephone Encounter (Signed)
Should be OK 

## 2015-10-07 ENCOUNTER — Ambulatory Visit (INDEPENDENT_AMBULATORY_CARE_PROVIDER_SITE_OTHER): Payer: Commercial Managed Care - HMO | Admitting: Internal Medicine

## 2015-10-07 ENCOUNTER — Encounter: Payer: Self-pay | Admitting: Internal Medicine

## 2015-10-07 VITALS — BP 108/60 | HR 66 | Temp 98.0°F | Resp 18 | Wt 222.5 lb

## 2015-10-07 DIAGNOSIS — E8881 Metabolic syndrome: Secondary | ICD-10-CM

## 2015-10-07 DIAGNOSIS — K219 Gastro-esophageal reflux disease without esophagitis: Secondary | ICD-10-CM

## 2015-10-07 DIAGNOSIS — Z8739 Personal history of other diseases of the musculoskeletal system and connective tissue: Secondary | ICD-10-CM | POA: Diagnosis not present

## 2015-10-07 DIAGNOSIS — K648 Other hemorrhoids: Secondary | ICD-10-CM | POA: Diagnosis not present

## 2015-10-07 DIAGNOSIS — G8929 Other chronic pain: Secondary | ICD-10-CM

## 2015-10-07 DIAGNOSIS — I10 Essential (primary) hypertension: Secondary | ICD-10-CM | POA: Diagnosis not present

## 2015-10-07 DIAGNOSIS — E785 Hyperlipidemia, unspecified: Secondary | ICD-10-CM | POA: Diagnosis not present

## 2015-10-07 DIAGNOSIS — E669 Obesity, unspecified: Secondary | ICD-10-CM | POA: Diagnosis not present

## 2015-10-07 DIAGNOSIS — Z87442 Personal history of urinary calculi: Secondary | ICD-10-CM

## 2015-10-07 DIAGNOSIS — Z8659 Personal history of other mental and behavioral disorders: Secondary | ICD-10-CM

## 2015-10-07 DIAGNOSIS — R911 Solitary pulmonary nodule: Secondary | ICD-10-CM

## 2015-10-07 DIAGNOSIS — Z8639 Personal history of other endocrine, nutritional and metabolic disease: Secondary | ICD-10-CM

## 2015-10-07 DIAGNOSIS — I214 Non-ST elevation (NSTEMI) myocardial infarction: Secondary | ICD-10-CM | POA: Diagnosis not present

## 2015-10-07 DIAGNOSIS — M549 Dorsalgia, unspecified: Secondary | ICD-10-CM

## 2015-10-11 ENCOUNTER — Telehealth: Payer: Self-pay | Admitting: Internal Medicine

## 2015-10-11 NOTE — Telephone Encounter (Signed)
Patient's wife, Blair Promise called on Friday, 5/19 wanting to know what patient could take for heartburn.  Wife advised that patient could use OTC Zantac 150mg  bid.  Patient instructed to take 1 before breakfast and 1 before supper time.  Patient's wife verbalized understanding of these instructions.

## 2015-10-14 ENCOUNTER — Other Ambulatory Visit: Payer: Self-pay | Admitting: *Deleted

## 2015-10-14 ENCOUNTER — Other Ambulatory Visit: Payer: Self-pay

## 2015-10-14 MED ORDER — CLOPIDOGREL BISULFATE 75 MG PO TABS: 75.0000 mg | ORAL_TABLET | Freq: Every day | ORAL | Status: DC

## 2015-10-14 NOTE — Telephone Encounter (Signed)
Per call from patients wife, the patient was put on this medication when he was in the hospital. Ok to refill under Dr [name]?

## 2015-10-15 ENCOUNTER — Encounter: Payer: Self-pay | Admitting: Internal Medicine

## 2015-10-15 NOTE — Progress Notes (Signed)
   Subjective:    Patient ID: Johnny [name], male    DOB: Sep 22, 1950, 65 y.o.   MRN: [id number]  HPI 65 year old Male has been on FMLA with Johnny [name] since non-STEMI. Unfortunately has not started exercise program. Part of the reason he had FMLA was to  take time to get healthy with diet and exercise. His weight is 222.5 pounds. Previous weight in February 2017 was 225 pounds. Cardiology took him off him during his headache has subsided. He is going to be referred pulmonology for opinion about abnormal chest CT. Wife is with him today and remains concerned about his health. On May 2 he was treated with Biaxin for 10 days for a respiratory infection. Chest x-ray at that time showed no evidence of pneumonia. He's been having some reflux symptoms and may take over-the-counter medication for that. Weight loss should help GE reflux. 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   Review of Systems see above     Objective:   Physical Exam  Neck is supple without JVD thyromegaly or carotid bruits. Chest clear to auscultation. Cardiac exam regular rate and rhythm. Extremities without edema      Assessment & Plan:  Acute URI has resolved  Patient and wife still concerned about nodules on chest CT-refer to pulmonary  Hyperlipidemia  Obesity  History of N STEMI  Essential hypertension  Plan: Appointment see Dr. Johnsie [name] in June and pulmonary physician in August.

## 2015-10-19 NOTE — Patient Instructions (Signed)
Continue same medications and return in 6 months. Follow-up with cardiologist in June and pulmonologist in August

## 2015-10-26 NOTE — Progress Notes (Signed)
Patient ID: Johnny [name], male   DOB: [birth year], 65 y.o.   MRN: [id number]    Cardiology Office Note   Date:  10/27/2015   ID:  Johnny [name], DOB Jul 02, 1950, MRN [id number]  PCP:  [name], MD  Cardiologist:  Dr. [name]    Chief Complaint  Patient presents with  . Coronary Artery Disease    no sx      History of Present Illness: Johnny [name] is a 65 y.o. male who presents for post hospitalization 09/10/15 - 09/13/15 for chest pain.   He has a history of HTN, HLD, ? Prior TIA and GERD, but no known h/o CAD, admitted for CP with + troponin.   Per Dr. Gwenlyn Found "mr. Johnny [name] has branch vessel disease and a high marginal branch and a first diagonal branch. These are small vessels in diameter though they are fairly long in length. He has moderate segmental mid LAD disease as well. At this point, I would recommend optimizing his antianginal medications. If he has continued chest pain he would be a candidate for angioplasty of his diagonal branch and marginal branch. " pk troponin 0.39.   Today he has no pain Discussed issues of statin, and nitrate intolerance. Need to try to advance medical Rx to avoid angioplasty/stent  Recent labs reviewed and normal LFTls LDL 88 HDL 37 TC 186  A1c 5.5   He and his wife had myriad of issues mostly revolving around side effects drugs:  Neurontin:  ? Cognitive changes only takes 300 at night been on it for 7 years for back pain Statin:  ? Still causing some muscle pain  Blurry Vision:  Worse since January but has not seen eye doctor   Past Medical History  Diagnosis Date  . Hyperlipidemia   . Hypertension   . GERD (gastroesophageal reflux disease)   . Osteopenia   . Internal hemorrhoids   . Adenomatous polyps   . Low testosterone   . Vitamin D deficiency   . Chronic back pain   . TIA (transient ischemic attack) 2008  . Shortness of breath dyspnea   . Bronchitis   . Ventral hernia   . Spondylosis     Past Surgical History    Procedure Laterality Date  . Back surgery      2 surgeries  . Cardiac catheterization N/A 09/12/2015    Procedure: Left Heart Cath and Coronary Angiography;  Surgeon: Lorretta Harp, MD;  Location: [facility];  Service: Cardiovascular;  Laterality: N/A;     Current Outpatient Prescriptions  Medication Sig Dispense Refill  . aspirin EC 81 MG tablet Take 81 mg by mouth daily.    . Calcium-Magnesium-Vitamin D (CALCIUM MAGNESIUM PO) Take by mouth.    . clopidogrel (PLAVIX) 75 MG tablet Take 1 tablet (75 mg total) by mouth daily. 30 tablet 6  . co-enzyme Q-10 30 MG capsule Take 30 mg by mouth 3 (three) times daily.    Marland Kitchen gabapentin (NEURONTIN) 300 MG capsule Take 300 mg by mouth at bedtime.    . Multiple Vitamin (MULTIVITAMIN) tablet Take 1 tablet by mouth daily.    . nebivolol (BYSTOLIC) 5 MG tablet Take 1 tablet (5 mg total) by mouth daily. 30 tablet 3  . nitroGLYCERIN (NITROSTAT) 0.4 MG SL tablet Place 1 tablet (0.4 mg total) under the tongue every 5 (five) minutes as needed for chest pain. 30 tablet 0  . oxyCODONE-acetaminophen (PERCOCET) 10-325 MG tablet Take 2 tablets by mouth every  6 (six) hours as needed for pain. Patient gets #180 pills monthly from Dr. Carloyn Manner, last filled February 6.    . pravastatin (PRAVACHOL) 10 MG tablet Take 1 tablet (10 mg total) by mouth every other day. 15 tablet 3  . ranolazine (RANEXA) 500 MG 12 hr tablet Take 1 tablet (500 mg total) by mouth 2 (two) times daily. 60 tablet 3   No current facility-administered medications for this visit.    Allergies:   Atorvastatin; Crestor; Levaquin; Metadate cd; Morphine and related; and Hydrocodone    Social History:  The patient  reports that he quit smoking about 37 years ago. His smoking use included Cigarettes. He quit after 10 years of use. He has never used smokeless tobacco. He reports that he does not drink alcohol or use illicit drugs.   Family History:  The patient's family history includes Emphysema  in his father; Heart attack in his mother; Heart disease in his mother; Stroke in his mother.    ROS:  General:no colds or fevers, no weight changes Skin:no rashes or ulcers HEENT:no blurred vision, no congestion CV:see HPI PUL:see HPI GI:no diarrhea constipation or melena, no indigestion GU:no hematuria, no dysuria MS:no joint pain, no claudication Neuro:no syncope, no lightheadedness Endo:no diabetes, no thyroid disease Wt Readings from Last 3 Encounters:  10/27/15 100.245 kg (221 lb)  10/07/15 100.925 kg (222 lb 8 oz)  09/26/15 102.876 kg (226 lb 12.8 oz)     PHYSICAL EXAM: VS:  BP 114/70 mmHg  Pulse 60  Ht 6' (1.829 m)  Wt 100.245 kg (221 lb)  BMI 29.97 kg/m2  SpO2 97% , BMI Body mass index is 29.97 kg/(m^2). General:Pleasant affect, NAD Skin:Warm and dry, brisk capillary refill HEENT:normocephalic, sclera clear, mucus membranes moist Neck:supple, no JVD, no bruits  Heart:S1S2 RRR without murmur, gallup, rub or click Lungs:clear without rales, rhonchi, or wheezes UXN:ATFT, non tender, + BS, do not palpate liver spleen or masses Ext:no lower ext edema, 2+ pedal pulses, 2+ radial pulses Neuro:alert and oriented, MAE, follows commands, + facial symmetry    EKG:  09/11/15 SR rate 21 old IMI    Recent Labs: 09/13/2015: BUN 13; Creatinine, Ser 1.26*; Potassium 4.4; Sodium 140 09/20/2015: Hemoglobin 14.1; Platelets 306 10/06/2015: ALT 26    Lipid Panel    Component Value Date/Time   CHOL 186 10/06/2015 1129   TRIG 303* 10/06/2015 1129   HDL 37* 10/06/2015 1129   CHOLHDL 5.0 10/06/2015 1129   VLDL 61* 10/06/2015 1129   [place] 88 10/06/2015 1129       Other studies Reviewed: Additional studies/ records that were reviewed today include: cath films .   ASSESSMENT AND PLAN:  1.  CAD: clinically stable  Nitrates cause migraines continue beta blocker and ranexa 2. Chol:  Target LDL 70 really not doing well with statin even qod  Not at goal refer lipid clinic ?  repatha Will recheck labs 3 months if Triglycerides over 300 after dietary changes start tricor Referred to nutritionist 3. TIA: no carotid disease or PAF continue DAT given concomitant CAD 4. Vision not clear why blurry doubt med effect refer to optometry    Current medicines are reviewed with the patient today.  The patient Has no concerns regarding medicines.    Disposition:   FU:  With me next available   [name]

## 2015-10-27 ENCOUNTER — Encounter: Payer: Self-pay | Admitting: Cardiovascular Disease

## 2015-10-27 ENCOUNTER — Ambulatory Visit (INDEPENDENT_AMBULATORY_CARE_PROVIDER_SITE_OTHER): Payer: Commercial Managed Care - HMO | Admitting: Cardiovascular Disease

## 2015-10-27 VITALS — BP 114/70 | HR 60 | Ht 72.0 in | Wt 221.0 lb

## 2015-10-27 DIAGNOSIS — I2510 Atherosclerotic heart disease of native coronary artery without angina pectoris: Secondary | ICD-10-CM

## 2015-10-27 DIAGNOSIS — Z79899 Other long term (current) drug therapy: Secondary | ICD-10-CM | POA: Diagnosis not present

## 2015-10-27 DIAGNOSIS — I2583 Coronary atherosclerosis due to lipid rich plaque: Principal | ICD-10-CM

## 2015-10-27 NOTE — Patient Instructions (Addendum)
Medication Instructions:  Your physician recommends that you continue on your current medications as directed. Please refer to the Current Medication list given to you today.  Lab work: Your physician recommends that you have lab work in 3 months, fasting lipid and liver panel.  Testing/Procedures: NONE  Follow-Up: Your physician wants you to follow-up in: 3 month  with Dr. [name].   Your physician recommends that you schedule a follow-up appointment with Elberta Leatherwood the pharmacist to discuss cholesterol medication.   If you need a refill on your cardiac medications before your next appointment, please call your pharmacy.

## 2015-10-30 NOTE — Progress Notes (Signed)
Patient ID: HALFORD SPAYD                 DOB: [birth year]                    MRN: [id number]     HPI: Johnny [name] is a 65 y.o. male patient referred to lipid clinic by Dr. [name].  He has a PMH significant for chest pain with + troponin, HTN, hyperlipidemia, and ? Prior TIA.  At his last visit with Dr. [name], they discussed need to optimize medication therapy to avoid intervention.  He has several concerns over statins causing myalgias.  He is currently taking pravastatin 10mg  every other day but states it is causing joint and muscle pain that keeps him up at night.  He has myalgias with Crestor and Lipitor that resolved upon discontinuation of therapy.    Pt's main complaint today is fatigue and depression like symptoms.  He still has no energy and is becoming depressed staying at home and not being as active as he was before.   Current Medications: pravastatin 10mg  every other day  Intolerances: Crestor (myalgias), Lipitor (myalgias) Risk Factors: angina, HTN, ? TIA LDL goal: < 70 mg/dL  Diet: Pt was eating a lot of fast food and high sugar content foods prior to his event in may.  Since then, he has stopped eating as much fast food but is still eating a lot of foods high in natural sugars such as fruit.  His wife states he is still struggling with portion control.   Exercise: Pt is trying to walk but limited due to fatigue.   Labs: 10/06/15- TC 186, TG 303, HDL 37, LDL 88, LFTs normal (on pravastatin 10mg  every other day)  Past Medical History  Diagnosis Date  . Hyperlipidemia   . Hypertension   . GERD (gastroesophageal reflux disease)   . Osteopenia   . Internal hemorrhoids   . Adenomatous polyps   . Low testosterone   . Vitamin D deficiency   . Chronic back pain   . TIA (transient ischemic attack) 2008  . Shortness of breath dyspnea   . Bronchitis   . Ventral hernia   . Spondylosis     Current Outpatient Prescriptions on File Prior to Visit  Medication Sig Dispense  Refill  . aspirin EC 81 MG tablet Take 81 mg by mouth daily.    . Calcium-Magnesium-Vitamin D (CALCIUM MAGNESIUM PO) Take by mouth.    . clopidogrel (PLAVIX) 75 MG tablet Take 1 tablet (75 mg total) by mouth daily. 30 tablet 6  . co-enzyme Q-10 30 MG capsule Take 30 mg by mouth 3 (three) times daily.    Marland Kitchen gabapentin (NEURONTIN) 300 MG capsule Take 300 mg by mouth at bedtime.    . Multiple Vitamin (MULTIVITAMIN) tablet Take 1 tablet by mouth daily.    . nebivolol (BYSTOLIC) 5 MG tablet Take 1 tablet (5 mg total) by mouth daily. 30 tablet 3  . nitroGLYCERIN (NITROSTAT) 0.4 MG SL tablet Place 1 tablet (0.4 mg total) under the tongue every 5 (five) minutes as needed for chest pain. 30 tablet 0  . oxyCODONE-acetaminophen (PERCOCET) 10-325 MG tablet Take 2 tablets by mouth every 6 (six) hours as needed for pain. Patient gets #180 pills monthly from Dr. Carloyn Manner, last filled February 6.    . pravastatin (PRAVACHOL) 10 MG tablet Take 1 tablet (10 mg total) by mouth every other day. 15 tablet 3  . ranolazine (RANEXA) 500  MG 12 hr tablet Take 1 tablet (500 mg total) by mouth 2 (two) times daily. 60 tablet 3   No current facility-administered medications on file prior to visit.    Allergies  Allergen Reactions  . Atorvastatin Other (See Comments)    Joint pain  . Crestor [Rosuvastatin Calcium] Other (See Comments)    Joint pain  . Levaquin [Levofloxacin In D5w] Nausea And Vomiting  . Metadate Cd [Methylphenidate Hcl Er (Cd)] Other (See Comments)    Burning, GI upset, bloating  . Morphine And Related Nausea And Vomiting  . Hydrocodone Nausea Only    Can take with food    Assessment/Plan:  1.  Hyperlipidemia- Pt is still having myalgias with every other day pravastatin.  Will have him stop pravastatin.  Discussed PCSK-9 inhibitor.  Pt is interested in therapy.  His recent labs were while on statin.  Will have him recheck labs after he has been off pravastatin x 1 month.  If LDL > 100 mg/dL, may pursue  PSCK-9 inhibitor at that time.   2. Fatigue- Pt's BP on the low side today at 56.  This may explain some of his fatigue.  Discussed with Dr. [name].  Will decrease Bystolic to 2.5mg  to see if this helps.  Suggested he follow up with PCP regarding depression like symptoms.

## 2015-10-31 ENCOUNTER — Ambulatory Visit (INDEPENDENT_AMBULATORY_CARE_PROVIDER_SITE_OTHER): Payer: Commercial Managed Care - HMO | Admitting: Pharmacist

## 2015-10-31 ENCOUNTER — Ambulatory Visit: Payer: Commercial Managed Care - HMO | Admitting: Pharmacist

## 2015-10-31 DIAGNOSIS — E7800 Pure hypercholesterolemia, unspecified: Secondary | ICD-10-CM | POA: Diagnosis not present

## 2015-10-31 MED ORDER — NEBIVOLOL HCL 2.5 MG PO TABS: 2.5000 mg | ORAL_TABLET | Freq: Every day | ORAL | Status: DC

## 2015-10-31 NOTE — Patient Instructions (Signed)
Decrease Bystolic to 2.5mg  daily  Stop the pravastatin.   Recheck labs in 1 month.  If your LDL (bad cholesterol) is > 100 then we will start the paperwork for Repatha.

## 2015-11-25 ENCOUNTER — Telehealth: Payer: Self-pay | Admitting: Cardiovascular Disease

## 2015-11-25 NOTE — Telephone Encounter (Signed)
New message   Pt wife is calling to speak to rn to see if the LAB appt is needed she feels its too far away from [place] appt

## 2015-11-25 NOTE — Telephone Encounter (Signed)
Spoke with patient's wife (DPR). Informed her that patient's lab appointment on 8/28 was changed to 7/12 by Elberta Leatherwood the pharmacist. Informed her that patient needs this lab work a month from the time he stopped the pravastatin, and that is the reason for the change. Patient's wife verbalized understanding. Encouraged her to have patient sign up for MyChart to see when appointments are made or any changes are made with the appointments.

## 2015-11-30 ENCOUNTER — Other Ambulatory Visit (INDEPENDENT_AMBULATORY_CARE_PROVIDER_SITE_OTHER): Payer: Commercial Managed Care - HMO | Admitting: *Deleted

## 2015-11-30 DIAGNOSIS — I2510 Atherosclerotic heart disease of native coronary artery without angina pectoris: Secondary | ICD-10-CM | POA: Diagnosis not present

## 2015-11-30 DIAGNOSIS — Z79899 Other long term (current) drug therapy: Secondary | ICD-10-CM

## 2015-11-30 DIAGNOSIS — I2583 Coronary atherosclerosis due to lipid rich plaque: Principal | ICD-10-CM

## 2015-11-30 LAB — LIPID PANEL
CHOL/HDL RATIO: 4.6 ratio (ref <=5.0)
CHOLESTEROL: 187 mg/dL (ref 125–200)
HDL: 41 mg/dL (ref >=40)
LDL Cholesterol: 99 mg/dL (ref <130)
TRIGLYCERIDES: 236 mg/dL — AB (ref <150)
VLDL: 47 mg/dL — ABNORMAL HIGH (ref <30)

## 2015-11-30 LAB — HEPATIC FUNCTION PANEL
ALBUMIN: 4.2 g/dL (ref 3.6–5.1)
ALT: 21 U/L (ref 9–46)
AST: 20 U/L (ref 10–35)
Alkaline Phosphatase: 55 U/L (ref 40–115)
BILIRUBIN TOTAL: 0.8 mg/dL (ref 0.2–1.2)
Bilirubin, Direct: 0.1 mg/dL (ref <=0.2)
Indirect Bilirubin: 0.7 mg/dL (ref 0.2–1.2)
Total Protein: 6.5 g/dL (ref 6.1–8.1)

## 2015-11-30 NOTE — Addendum Note (Signed)
Addended by: Eulis Foster on: 11/30/2015 07:53 AM   Modules accepted: Orders

## 2015-12-05 ENCOUNTER — Encounter: Payer: Self-pay | Admitting: Cardiovascular Disease

## 2015-12-14 ENCOUNTER — Telehealth: Payer: Self-pay | Admitting: Cardiovascular Disease

## 2015-12-14 NOTE — Telephone Encounter (Signed)
Spoke with Renee at [facility] and she is requesting the pts most recent OV note with Dr [name] on 6/7.  Renee needs to know this pts restriction level from a cardiac perspective, for he has claimed disability since his heart attack in the past, and now needs updated clinical information on this pt, for he should be in the recovery period.  Informed Renee that I spoke with our Medical Records Representative about this request, and per Carilyn Goodpasture should fax a restriction form to Kim's fax number, so she can prep paperwork for Dr [name] to review and advise on.  Informed Renee to fax this request to Kim at [phone].  Informed Renee this is due to HIPPA policy and the pt must sign a release form for our office to talk to them, before giving information over the phone.  Renee verbalized understanding and agrees with this plan.

## 2015-12-14 NOTE — Telephone Encounter (Signed)
New message   She is requesting conformation following pt June 8th appt is there any restrictions for him returning   Please use fax [phone]

## 2015-12-15 ENCOUNTER — Ambulatory Visit (INDEPENDENT_AMBULATORY_CARE_PROVIDER_SITE_OTHER): Payer: Commercial Managed Care - HMO | Admitting: Internal Medicine

## 2015-12-15 ENCOUNTER — Encounter: Payer: Self-pay | Admitting: Internal Medicine

## 2015-12-15 VITALS — BP 120/74 | HR 62 | Temp 97.9°F | Ht 72.0 in | Wt 216.0 lb

## 2015-12-15 DIAGNOSIS — F329 Major depressive disorder, single episode, unspecified: Secondary | ICD-10-CM

## 2015-12-15 DIAGNOSIS — I214 Non-ST elevation (NSTEMI) myocardial infarction: Secondary | ICD-10-CM

## 2015-12-15 DIAGNOSIS — R5383 Other fatigue: Secondary | ICD-10-CM

## 2015-12-15 DIAGNOSIS — I10 Essential (primary) hypertension: Secondary | ICD-10-CM | POA: Diagnosis not present

## 2015-12-15 DIAGNOSIS — E7800 Pure hypercholesterolemia, unspecified: Secondary | ICD-10-CM | POA: Diagnosis not present

## 2015-12-15 DIAGNOSIS — R7302 Impaired glucose tolerance (oral): Secondary | ICD-10-CM | POA: Diagnosis not present

## 2015-12-15 DIAGNOSIS — F32A Depression, unspecified: Secondary | ICD-10-CM

## 2015-12-15 NOTE — Patient Instructions (Signed)
Start Wellbutrin XL 150 mg daily and return in 3 months. Continued follow-up with cardiology. Patient and wife were discussed with cardiology office patient's current restrictions with regard to short-term disability and being out of work. Referral made to dietitian. Patient needs to increase level of activity and be doing some active exercise on a daily basis.

## 2015-12-16 ENCOUNTER — Telehealth: Payer: Self-pay

## 2015-12-16 NOTE — Telephone Encounter (Signed)
Patient is requesting a note to return to work. Patient is a Administrator. Will forward to Dr. [name] for approval. Will call patient back with response. Patient knows that Dr. [name] is out of the office at this time and will return early next week. Patient verbalize understanding.

## 2015-12-17 ENCOUNTER — Encounter: Payer: Self-pay | Admitting: Internal Medicine

## 2015-12-17 MED ORDER — BUPROPION HCL ER (XL) 150 MG PO TB24: 150.0000 mg | ORAL_TABLET | Freq: Every day | ORAL | Status: DC

## 2015-12-17 NOTE — Progress Notes (Signed)
Subjective:    Patient ID: Johnny [name], male    DOB: May 17, 1951, 65 y.o.   MRN: [id number]  HPI Patient and wife are here to discuss multiple issues today. 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 It seems that  90 days of short-term disability are running out. He feels fatigued and wife indicates he is depressed. Sometimes he has some vague chest pain even at rest. He is concerned that he may have to drive long distances with [employer]. He's not been working out daily. Goes to the gym maybe every other day. He has been watching his diet. He may actually be following too strict of a diet. Wife wants his short-term disability extended. I'm not sure we can do that given his medical condition. I really would prefer that they discuss his work activities  with his Cardiologist. He is asking about a dietary referral and we will make that referral. His blood pressure is stable and under good control. His weight is 216 pounds. He needs to get under 200 pounds. He was last seen here in May and had not started exercise program. His weight at that time was 222.5 pounds. Weight in February 2017 was 225 pounds. He has a  nodule in the right lung that is being followed. Has a prior history of smoking. He has had a repeat CT of the chest in May and Pulmonology is recommending four-month follow-up regarding 5 mm nodule right lung.   Patient apparently cannot tolerate statins due to musculoskeletal pain. He is under consideration for Repatha.  Triglycerides have decreased from 303 in May to 236. This is just with diet alone. Previously triglycerides were 372 in February 2016. He has a history of glucose intolerance but hemoglobin A1c in May was 5.5%.  History of migraine with RA in 2016. History of kidney stones.  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 Apparently he went for one in 2016 at [facility] and Dr. Everlene Farrier wanted him to see a neurologist since he had episode of visual  disturbance and headache in February 2016 with normal MRI. He did see neurologist in follow-up. He had been started on Plavix at that time of the episode but that was later discontinued and he was just advised to take aspirin.        Review of Systems see above.      Objective:   Physical Exam  Constitutional: He is oriented to person, place, and time. He appears well-developed and well-nourished.  Cardiovascular: Normal rate, regular rhythm, normal heart sounds and intact distal pulses.   Pulmonary/Chest: He has no wheezes. He has no rales.  Musculoskeletal: He exhibits no edema.  Neurological: He is alert and oriented to person, place, and time. He has normal reflexes.  Affect is slightly flat  Skin: Skin is warm and dry. He is not diaphoretic.  Psychiatric: His behavior is normal. Judgment and thought content normal.  Vitals reviewed.         Assessment & Plan:  Fatigue  History of N STEMI  History of hyperlipidemia-triglycerides of improved with diet alone. Statin intolerant. Cardiology office trying to see if he is a candidate for [place]. It may be his lipids would normalize with diet, exercise and weight loss. I do think he should be working out every day. This would also help his depression. Referral to dietitian made.  Depression-start Wellbutrin XL 150 mg daily. Wife is asking about medication for attention deficit  disorder. I do not want to prescribe that for the patient. I do not think he needs it at this point his life. He used to take it in the past. Not think it would be wise for cardiac standpoint either.  Small right lung nodule being followed by serial CT scans at Pulmonary  Plan: Continue to see cardiology. Follow-up is in early September. I will see him again in 3 months. He and his wife need to have a discussion about his long-term plans. He is retired from the [employer]. He had mention seeking employment with his son.

## 2015-12-20 NOTE — Telephone Encounter (Signed)
Ok to go back to work

## 2015-12-21 ENCOUNTER — Telehealth: Payer: Self-pay | Admitting: Pharmacist

## 2015-12-21 MED ORDER — ALIROCUMAB 75 MG/ML ~~LOC~~ SOPN: 75.0000 mg | PEN_INJECTOR | SUBCUTANEOUS | 6 refills | Status: DC

## 2015-12-21 NOTE — Telephone Encounter (Signed)
Spoke with pt's wife.  Praluent was approved and Rx sent to specialty pharmacy.  They will call once they receive shipment to set up labwork.

## 2015-12-21 NOTE — Telephone Encounter (Signed)
Called and left message with patient's wife (DPR) that per Dr. [name], okay to go back to work. Patient needs a letter. Will put letter up at front desk for patient to pick up.

## 2015-12-22 ENCOUNTER — Telehealth: Payer: Self-pay

## 2015-12-22 ENCOUNTER — Ambulatory Visit: Payer: Commercial Managed Care - HMO | Admitting: Pulmonary Disease

## 2015-12-22 NOTE — Telephone Encounter (Signed)
Left message for patient to call back. Calling to let patient know that we received forms for his disability. Per Dr. [name], there is no reason to keep him out of work after his next office visit which is 01/20/16.

## 2015-12-22 NOTE — Telephone Encounter (Signed)
Informed patient's wife (DPR) of Dr. Kyla Balzarine message. Patient's wife thank me for the call, and will call back with any other questions.

## 2015-12-22 NOTE — Telephone Encounter (Signed)
Returning call , Please call his wife number [phone] , because he does not have voice mail.

## 2015-12-29 ENCOUNTER — Ambulatory Visit: Payer: Commercial Managed Care - HMO

## 2015-12-30 ENCOUNTER — Emergency Department (HOSPITAL_COMMUNITY)
Admission: EM | Admit: 2015-12-30 | Discharge: 2015-12-30 | Disposition: A | Discharge status: Home / Self Care | Payer: Commercial Managed Care - HMO | Attending: Emergency Medicine | Admitting: Emergency Medicine

## 2015-12-30 ENCOUNTER — Encounter (HOSPITAL_COMMUNITY): Payer: Self-pay | Admitting: *Deleted

## 2015-12-30 ENCOUNTER — Emergency Department (HOSPITAL_COMMUNITY): Payer: Commercial Managed Care - HMO

## 2015-12-30 DIAGNOSIS — I2510 Atherosclerotic heart disease of native coronary artery without angina pectoris: Secondary | ICD-10-CM | POA: Insufficient documentation

## 2015-12-30 DIAGNOSIS — I10 Essential (primary) hypertension: Secondary | ICD-10-CM | POA: Insufficient documentation

## 2015-12-30 DIAGNOSIS — N201 Calculus of ureter: Secondary | ICD-10-CM

## 2015-12-30 DIAGNOSIS — Z8673 Personal history of transient ischemic attack (TIA), and cerebral infarction without residual deficits: Secondary | ICD-10-CM | POA: Insufficient documentation

## 2015-12-30 DIAGNOSIS — Z7982 Long term (current) use of aspirin: Secondary | ICD-10-CM | POA: Diagnosis not present

## 2015-12-30 DIAGNOSIS — Z87891 Personal history of nicotine dependence: Secondary | ICD-10-CM | POA: Diagnosis not present

## 2015-12-30 DIAGNOSIS — R1032 Left lower quadrant pain: Secondary | ICD-10-CM | POA: Diagnosis present

## 2015-12-30 DIAGNOSIS — Z79899 Other long term (current) drug therapy: Secondary | ICD-10-CM | POA: Diagnosis not present

## 2015-12-30 HISTORY — DX: Acute myocardial infarction, unspecified: I21.9

## 2015-12-30 HISTORY — DX: Calculus of ureter: N20.1

## 2015-12-30 LAB — BASIC METABOLIC PANEL
ANION GAP: 10 (ref 5–15)
BUN: 12 mg/dL (ref 6–20)
CHLORIDE: 102 mmol/L (ref 101–111)
CO2: 21 mmol/L — ABNORMAL LOW (ref 22–32)
Calcium: 9.9 mg/dL (ref 8.9–10.3)
Creatinine, Ser: 1.3 mg/dL — ABNORMAL HIGH (ref 0.61–1.24)
GFR calc Af Amer: >60 mL/min (ref >60)
GFR, EST NON AFRICAN AMERICAN: 57 mL/min — AB (ref >60)
GLUCOSE: 131 mg/dL — AB (ref 65–99)
POTASSIUM: 3.5 mmol/L (ref 3.5–5.1)
Sodium: 133 mmol/L — ABNORMAL LOW (ref 135–145)

## 2015-12-30 LAB — I-STAT TROPONIN, ED
Troponin i, poc: 0 ng/mL (ref 0.00–0.08)
Troponin i, poc: 0 ng/mL (ref 0.00–0.08)

## 2015-12-30 LAB — CBC
HEMATOCRIT: 42.6 % (ref 39.0–52.0)
HEMOGLOBIN: 15.1 g/dL (ref 13.0–17.0)
MCH: 33 pg (ref 26.0–34.0)
MCHC: 35.4 g/dL (ref 30.0–36.0)
MCV: 93 fL (ref 78.0–100.0)
Platelets: 236 10*3/uL (ref 150–400)
RBC: 4.58 MIL/uL (ref 4.22–5.81)
RDW: 12.7 % (ref 11.5–15.5)
WBC: 15 10*3/uL — AB (ref 4.0–10.5)

## 2015-12-30 LAB — URINALYSIS, ROUTINE W REFLEX MICROSCOPIC
Bilirubin Urine: NEGATIVE
Glucose, UA: NEGATIVE mg/dL
Hgb urine dipstick: NEGATIVE
Ketones, ur: NEGATIVE mg/dL
LEUKOCYTES UA: NEGATIVE
Nitrite: NEGATIVE
PROTEIN: NEGATIVE mg/dL
SPECIFIC GRAVITY, URINE: 1.019 (ref 1.005–1.030)
pH: 5.5 (ref 5.0–8.0)

## 2015-12-30 MED ORDER — HYDROMORPHONE HCL 1 MG/ML IJ SOLN
1.0000 mg | Freq: Once | INTRAMUSCULAR | Status: AC
  Administered 2015-12-30: 1 mg via INTRAVENOUS
  Filled 2015-12-30: qty 1

## 2015-12-30 MED ORDER — HYDROMORPHONE HCL 4 MG PO TABS: 4.0000 mg | ORAL_TABLET | ORAL | 0 refills | Status: DC | PRN

## 2015-12-30 MED ORDER — ONDANSETRON 8 MG PO TBDP: ORAL_TABLET | ORAL | 1 refills | Status: DC

## 2015-12-30 MED ORDER — FENTANYL CITRATE (PF) 100 MCG/2ML IJ SOLN
100.0000 ug | Freq: Once | INTRAMUSCULAR | Status: AC
  Administered 2015-12-30: 100 ug via INTRAVENOUS
  Filled 2015-12-30: qty 2

## 2015-12-30 MED ORDER — ONDANSETRON HCL 4 MG/2ML IJ SOLN
4.0000 mg | Freq: Once | INTRAMUSCULAR | Status: AC
  Administered 2015-12-30: 4 mg via INTRAVENOUS
  Filled 2015-12-30: qty 2

## 2015-12-30 MED ORDER — KETOROLAC TROMETHAMINE 15 MG/ML IJ SOLN
15.0000 mg | Freq: Once | INTRAMUSCULAR | Status: AC
  Administered 2015-12-30: 15 mg via INTRAVENOUS
  Filled 2015-12-30: qty 1

## 2015-12-30 NOTE — ED Triage Notes (Signed)
PT states that he began having rt flank pain yesterday; pt states that he began to have chest pain around 2am; pt states that the chest pain has gotten progressively worse; pt states that the pain radiates to his neck and back; pt diaphoretic and N/V upon arrival; pt states that he had 2 MI's in April

## 2015-12-30 NOTE — ED Notes (Signed)
MD at bedside. 

## 2015-12-30 NOTE — ED Notes (Signed)
Per Dr.Molpus pt to have 100mg  Fentanyl IVP and 4mg  Zofran IVP for pain control.

## 2015-12-30 NOTE — ED Provider Notes (Signed)
[facility] DEPT Provider Note   CSN: [id number] Arrival date & time: 12/30/15  [id number]  First Provider Contact:  First MD Initiated Contact with Patient 12/30/15 0505        History   Chief Complaint Chief Complaint  Patient presents with  . Flank Pain    HPI Johnny [name] is a 65 y.o. male with a history of kidney stones and coronary artery disease. He is here with left flank pain that began about 24 hours ago. The pain has been waxing and waning but became severe earlier this morning. He describes the pain as like review his kidney stones. It radiates to his left lower quadrant. He is also having left-sided chest pain which began about 2AM this morning after the flank pain worsened. He is not sure if this represents radiation of the flank pain or cardiac pain. His pain has been associated with diaphoresis, nausea and vomiting. He rates it as severe. He took Flomax earlier without relief. Pain is somewhat worse with movement or palpation.  HPI    Past Medical History:  Diagnosis Date  . Adenomatous polyps   . Bronchitis   . Chronic back pain   . GERD (gastroesophageal reflux disease)   . Hyperlipidemia   . Hypertension   . Internal hemorrhoids   . Low testosterone   . Myocardial infarction ([place])   . Osteopenia   . Shortness of breath dyspnea   . Spondylosis   . TIA (transient ischemic attack) 2008  . Ureterolithiasis   . Ventral hernia   . Vitamin D deficiency     Patient Active Problem List   Diagnosis Date Noted  . Exertional dyspnea 09/23/2015  . CAP (community acquired pneumonia) 09/23/2015  . Lung nodule 09/23/2015  . Cough 09/23/2015  . Pneumonitis   . Coronary artery disease involving native coronary artery of native heart with unstable angina pectoris ([place])   . NSTEMI (non-ST elevated myocardial infarction) ([place])   . Chest pain 09/10/2015  . 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 Osteopenia 01/20/2012  . Hx of adenomatous colonic polyps 01/20/2012  . GE  reflux 01/20/2012  . Vitamin D deficiency 01/20/2012  . Low serum testosterone level 01/20/2012  . Erectile dysfunction 01/20/2012  . Impaired glucose tolerance 01/20/2012  . Chronic back pain 01/20/2012  . Depression 02/01/2011  . ATTENTION OR CONCENTRATION DEFICIT 01/11/2010  . HYPERCHOLESTEROLEMIA 11/30/2008  . Essential hypertension 11/30/2008  . DEGENERATIVE JOINT DISEASE 11/30/2008    Past Surgical History:  Procedure Laterality Date  . BACK SURGERY     2 surgeries  . CARDIAC CATHETERIZATION N/A 09/12/2015   Procedure: Left Heart Cath and Coronary Angiography;  Surgeon: Lorretta Harp, MD;  Location: [facility];  Service: Cardiovascular;  Laterality: N/A;       Home Medications    Prior to Admission medications   Medication Sig Start Date End Date Taking? Authorizing Provider  aspirin EC 81 MG tablet Take 81 mg by mouth daily.   Yes Historical Provider, MD  Calcium-Magnesium-Vitamin D (CALCIUM MAGNESIUM PO) Take by mouth.   Yes Historical Provider, MD  clopidogrel (PLAVIX) 75 MG tablet Take 1 tablet (75 mg total) by mouth daily. 10/14/15  Yes [name], MD  nebivolol (BYSTOLIC) 2.5 MG tablet Take 1 tablet (2.5 mg total) by mouth daily. 10/31/15  Yes [name], MD  ranolazine (RANEXA) 500 MG 12 hr tablet Take 1 tablet (500 mg total) by mouth 2 (two) times daily. 09/26/15  Yes Otilio Carpen  Dorene Ar, NP  tamsulosin (FLOMAX) 0.4 MG CAPS capsule Take 0.4 mg by mouth daily.   Yes Historical Provider, MD  HYDROmorphone (DILAUDID) 4 MG tablet Take 1 tablet (4 mg total) by mouth every 4 (four) hours as needed for severe pain. 12/30/15   Gibril Mastro, MD  nitroGLYCERIN (NITROSTAT) 0.4 MG SL tablet Place 1 tablet (0.4 mg total) under the tongue every 5 (five) minutes as needed for chest pain. Patient not taking: Reported on 12/15/2015 09/13/15   Jonetta Osgood, MD  ondansetron (ZOFRAN ODT) 8 MG disintegrating tablet 8mg  ODT q4 hours prn nausea 12/30/15   Shanon Rosser, MD     Family History Family History  Problem Relation Age of Onset  . Heart disease Mother   . Stroke Mother   . Heart attack Mother   . Emphysema Father     Social History Social History  Substance Use Topics  . Smoking status: Former Smoker    Years: 10.00    Types: Cigarettes    Quit date: 01/04/1978  . Smokeless tobacco: Never Used  . Alcohol use No     Comment: rarely     Allergies   Atorvastatin; Crestor [rosuvastatin calcium]; Levaquin [levofloxacin in d5w]; Metadate cd [methylphenidate hcl er (cd)]; Morphine and related; and Hydrocodone   Review of Systems Review of Systems  All other systems reviewed and are negative.    Physical Exam Updated Vital Signs BP 121/62 (BP Location: Right Arm)   Pulse 92   Temp 97.8 F (36.6 C) (Oral)   Resp 16   Ht 6' (1.829 m)   Wt 215 lb (97.5 kg)   SpO2 99%   BMI 29.16 kg/m   Physical Exam General: Well-developed, well-nourished male in obvious discomfort; appearance consistent with age of record HENT: normocephalic; atraumatic Eyes: pupils equal, round and reactive to light; extraocular muscles intact Neck: supple Heart: regular rate and rhythm Lungs: clear to auscultation bilaterally Abdomen: soft; nondistended; left lower quadrant tenderness; no masses or hepatosplenomegaly; bowel sounds present GU: Left CVA tenderness Extremities: No deformity; full range of motion; pulses normal Neurologic: Awake, alert and oriented; motor function intact in all extremities and symmetric; no facial droop Skin: Clammy Psychiatric: Agitated    ED Treatments / Results   Nursing notes and vitals signs, including pulse oximetry, reviewed.  Summary of this visit's results, reviewed by myself:  Labs:  Results for orders placed or performed during the hospital encounter of 12/30/15 (from the past 24 hour(s))  Basic metabolic panel     Status: Abnormal   Collection Time: 12/30/15  4:39 AM  Result Value Ref Range   Sodium 133  (L) 135 - 145 mmol/L   Potassium 3.5 3.5 - 5.1 mmol/L   Chloride 102 101 - 111 mmol/L   CO2 21 (L) 22 - 32 mmol/L   Glucose, Bld 131 (H) 65 - 99 mg/dL   BUN 12 6 - 20 mg/dL   Creatinine, Ser 1.30 (H) 0.61 - 1.24 mg/dL   Calcium 9.9 8.9 - 10.3 mg/dL   GFR calc non Af Amer 57 (L) >60 mL/min   GFR calc Af Amer >60 >60 mL/min   Anion gap 10 5 - 15  CBC     Status: Abnormal   Collection Time: 12/30/15  4:39 AM  Result Value Ref Range   WBC 15.0 (H) 4.0 - 10.5 K/uL   RBC 4.58 4.22 - 5.81 MIL/uL   Hemoglobin 15.1 13.0 - 17.0 g/dL   HCT 42.6 39.0 - 52.0 %  MCV 93.0 78.0 - 100.0 fL   MCH 33.0 26.0 - 34.0 pg   MCHC 35.4 30.0 - 36.0 g/dL   RDW 12.7 11.5 - 15.5 %   Platelets 236 150 - 400 K/uL  I-stat troponin, ED     Status: None   Collection Time: 12/30/15  4:52 AM  Result Value Ref Range   Troponin i, poc 0.00 0.00 - 0.08 ng/mL   Comment 3          Urinalysis, Routine w reflex microscopic (not at [facility])     Status: None   Collection Time: 12/30/15  5:46 AM  Result Value Ref Range   Color, Urine YELLOW YELLOW   APPearance CLEAR CLEAR   Specific Gravity, Urine 1.019 1.005 - 1.030   pH 5.5 5.0 - 8.0   Glucose, UA NEGATIVE NEGATIVE mg/dL   Hgb urine dipstick NEGATIVE NEGATIVE   Bilirubin Urine NEGATIVE NEGATIVE   Ketones, ur NEGATIVE NEGATIVE mg/dL   Protein, ur NEGATIVE NEGATIVE mg/dL   Nitrite NEGATIVE NEGATIVE   Leukocytes, UA NEGATIVE NEGATIVE  I-stat troponin, ED     Status: None   Collection Time: 12/30/15  7:44 AM  Result Value Ref Range   Troponin i, poc 0.00 0.00 - 0.08 ng/mL   Comment 3            Imaging Studies: Dg Chest 2 View  Result Date: 12/30/2015 CLINICAL DATA:  Left-sided flank pain radiating into the chest, initial encounter EXAM: CHEST  2 VIEW COMPARISON:  09/20/2015 FINDINGS: Cardiac shadow is within normal limits. The lungs are clear bilaterally. No bony abnormality is noted. IMPRESSION: No active cardiopulmonary disease. Electronically Signed   By: Inez Catalina M.D.   On: 12/30/2015 07:34   Ct Renal Stone Study  Result Date: 12/30/2015 CLINICAL DATA:  Left flank and groin pain this morning. EXAM: CT ABDOMEN AND PELVIS WITHOUT CONTRAST TECHNIQUE: Multidetector CT imaging of the abdomen and pelvis was performed following the standard protocol without IV contrast. COMPARISON:  07/13/2015 FINDINGS: Lower chest: Stable small pulmonary nodules some of which are calcified granulomas. The 5 mm nodule in the right middle lobe is unchanged since 10/26/2014. Stable patchy areas of scarring change and minimal atelectasis. No pleural effusion. The heart is normal in size. No pericardial effusion. The distal esophagus is grossly normal. Hepatobiliary: No focal hepatic lesions or intrahepatic biliary dilatation. The gallbladder is normal. No common bile duct dilatation. Pancreas: No mass, inflammation or ductal dilatation. Spleen: Normal size.  No focal lesions. Adrenals/Urinary Tract: The adrenal glands are normal. Numerous small bilateral renal calculi. Small renal cysts are also noted. Moderate left-sided hydroureteronephrosis down to an obstructing 3 mm calculus at the left UVJ. No bladder calculi. No bladder mass. The prostate gland and seminal vesicles are unremarkable. Stomach/Bowel: The stomach, duodenum, small bowel and colon are grossly normal without oral contrast. No inflammatory changes, mass lesions or obstructive findings. The terminal ileum and appendix are normal. Vascular/Lymphatic: No mesenteric or retroperitoneal mass or adenopathy. Small scattered lymph nodes stable. No significant atherosclerotic calcifications involving the aorta. No aneurysm. Other: No ascites or abdominal wall hernia. No inguinal mass or adenopathy. Musculoskeletal: No significant bony findings. Lumbar fusion hardware at L4-5. IMPRESSION: 3 mm left UVJ calculus causing moderate left-sided hydroureteronephrosis. Multiple bilateral renal calculi. Stable 5 mm right middle lobe pulmonary  nodule since prior study from 1 year ago. Recommend followup noncontrast chest CT in 1 year. Electronically Signed   By: Marijo Sanes M.D.   On:  12/30/2015 07:33     EKG Interpretation  Date/Time:  Friday December 30 2015 04:24:42 EDT Ventricular Rate:  65 PR Interval:    QRS Duration: 108 QT Interval:  409 QTC Calculation: 426 R Axis:   63 Text Interpretation:  Sinus rhythm Low voltage, extremity leads Baseline wander in lead(s) V2 No significant change was found Confirmed by Florina Ou  MD, Jenny Reichmann ([id number]) on 12/30/2015 5:05:56 AM Also confirmed by Florina Ou  MD, Jenny Reichmann ([id number]), editor [place] CT, Leda Gauze [phone])  on 12/30/2015 7:40:45 AM      7:51 AM Patient's flank pain is now well controlled after multiple doses of analgesics. He is having no chest pain. Troponin has been negative 2 and it has been 6 hours since the onset of the patient's chest pain.    Procedures (including critical care time)   Final Clinical Impressions(s) / ED Diagnoses   Final diagnoses:  Ureterolithiasis      Shanon Rosser, MD 12/30/15 [phone]

## 2015-12-30 NOTE — ED Notes (Signed)
Pt reports no change in pain after initial dose of Fentanyl. Second dose administered. Pt still reporting pain in left flank with radiation into groin.

## 2015-12-30 NOTE — ED Notes (Signed)
Dr.Molpus notified of pt pain and no change with prior medications.

## 2016-01-16 ENCOUNTER — Other Ambulatory Visit: Payer: Commercial Managed Care - HMO

## 2016-01-19 ENCOUNTER — Other Ambulatory Visit: Payer: Self-pay | Admitting: Cardiology

## 2016-01-20 ENCOUNTER — Ambulatory Visit: Payer: Commercial Managed Care - HMO | Admitting: Cardiovascular Disease

## 2016-01-20 ENCOUNTER — Ambulatory Visit: Payer: Commercial Managed Care - HMO | Admitting: Dietician

## 2016-01-24 ENCOUNTER — Telehealth: Payer: Self-pay | Admitting: Pharmacist

## 2016-01-24 NOTE — Telephone Encounter (Signed)
Called to follow up with patient and ensure he had started the Praluent injections.  His wife states he did start but is unsure if he wants to continue because he is having some flu like symptoms and muscle aches similar to what he had with the statins.  He would like to try and see if he can remain on therapy but unsure that he will be able to tolerate.  Will have him continue as long as he is tolerating the side effects.  He sees Dr. [name] in 1 month.  WIll recheck labs at that time.

## 2016-02-07 ENCOUNTER — Encounter: Payer: Self-pay | Admitting: Emergency Medicine

## 2016-02-07 ENCOUNTER — Ambulatory Visit (INDEPENDENT_AMBULATORY_CARE_PROVIDER_SITE_OTHER): Payer: Self-pay | Admitting: Urgent Care

## 2016-02-07 VITALS — BP 121/82 | HR 69 | Temp 98.2°F | Resp 17 | Ht 72.5 in | Wt 209.0 lb

## 2016-02-07 DIAGNOSIS — I208 Other forms of angina pectoris: Secondary | ICD-10-CM

## 2016-02-07 DIAGNOSIS — E785 Hyperlipidemia, unspecified: Secondary | ICD-10-CM

## 2016-02-07 DIAGNOSIS — Z024 Encounter for examination for driving license: Secondary | ICD-10-CM

## 2016-02-07 DIAGNOSIS — Z021 Encounter for pre-employment examination: Secondary | ICD-10-CM

## 2016-02-07 DIAGNOSIS — G8929 Other chronic pain: Secondary | ICD-10-CM

## 2016-02-07 DIAGNOSIS — N200 Calculus of kidney: Secondary | ICD-10-CM

## 2016-02-07 DIAGNOSIS — M549 Dorsalgia, unspecified: Secondary | ICD-10-CM

## 2016-02-07 DIAGNOSIS — I2510 Atherosclerotic heart disease of native coronary artery without angina pectoris: Secondary | ICD-10-CM

## 2016-02-07 DIAGNOSIS — I2583 Coronary atherosclerosis due to lipid rich plaque: Secondary | ICD-10-CM

## 2016-02-07 NOTE — Progress Notes (Signed)
Commercial Driver Medical Examination   Johnny [name] is a 65 y.o. male who presents today for a DOT physical exam. The patient reports history of stable angina, s/p heart cath in 08/2015. Patient is being managed medically will close follow up by Dr. [name]. He has not had chest pain since 08/2015, has not needed to use nitro. He also has chronic back pain s/p 2 back surgeries, takes opioid medications daily for this. Has history of nephrolithiasis, take opioid pain medications occasionally for this, last episode was 11/2015.   The following portions of the patient's history were reviewed and updated as appropriate: allergies, current medications, past family history, past medical history, past social history and past surgical history.  Objective:   BP 121/82 (BP Location: Right Arm, Patient Position: Sitting, Cuff Size: Normal)   Pulse 69   Temp 98.2 F (36.8 C) (Oral)   Resp 17   Ht 6' 0.5" (1.842 m)   Wt 209 lb (94.8 kg)   SpO2 98%   BMI 27.96 kg/m   Vision/hearing:  Visual Acuity Screening   Right eye Left eye Both eyes  Without correction:     With correction: 20/15 20/15 20/15   Hearing Screening Comments: Peripheral Vision: Right eye 85 degrees. Left eye 85 degrees. The patient can distinguish the colors red, amber and green. The patient was able to hear a forced whisper from L=10 R=10  feet.  Patient can recognize and distinguish among traffic control signals and devices showing standard red, green, and amber colors.  Corrective lenses required: Yes  Monocular Vision?: No  Hearing aid requirement: No  Physical Exam  Constitutional: He is oriented to person, place, and time. He appears well-developed and well-nourished.  HENT:  TM's intact bilaterally, no effusions or erythema. Nasal turbinates pink and moist, nasal passages patent. No sinus tenderness. Oropharynx clear, mucous membranes moist, dentition in good repair.  Eyes: Conjunctivae and EOM are normal.  Pupils are equal, round, and reactive to light. Right eye exhibits no discharge. Left eye exhibits no discharge. No scleral icterus.  Neck: Normal range of motion. Neck supple. No thyromegaly present.  Cardiovascular: Normal rate, regular rhythm and intact distal pulses.  Exam reveals no gallop and no friction rub.   No murmur heard. Pulmonary/Chest: No stridor. No respiratory distress. He has no wheezes. He has no rales.  Abdominal: Soft. Bowel sounds are normal. He exhibits no distension and no mass. There is no tenderness.  Musculoskeletal: Normal range of motion. He exhibits no edema or tenderness.  Lymphadenopathy:    He has no cervical adenopathy.  Neurological: He is alert and oriented to person, place, and time. He has normal reflexes.  Skin: Skin is warm and dry. No rash noted. No erythema. No pallor.  Psychiatric: He has a normal mood and affect.    Labs: Comments: SPGR:1.005 GLU:neg,BLOOD:neg,PROTEIN:neg  Assessment:    Healthy male exam.  Meets standards, but periodic monitoring required due to stable angina.  Driver qualified only for 1 year.    Plan:   Medical examiners certificate completed and printed. Return as needed.

## 2016-02-07 NOTE — Patient Instructions (Signed)

## 2016-02-08 ENCOUNTER — Encounter: Payer: Self-pay | Admitting: Cardiovascular Disease

## 2016-02-15 ENCOUNTER — Telehealth: Payer: Self-pay | Admitting: Cardiovascular Disease

## 2016-02-15 NOTE — Telephone Encounter (Signed)
Patient's wife (DPR) calling about patient's Plavix and ASA. Patient is having frequent cuts and nicks from activities of daily living. Patient's wife states that patient's bleeding takes a while to stop. Informed patient's wife that she would need to apply pressure longer since patient is on these medications. Patient's wife states it takes longer than it should to stop bleeding. Patient's wife stated if her husband has an accident that he would be in serious trouble. Patient's wife wants to know if patient can cut out the ASA. Informed her that a message would be sent to Dr. [name]  for advisement.  Patient wife verbalized understanding.

## 2016-02-15 NOTE — Telephone Encounter (Signed)
Patient's wife has not set up her voicemail on the number left to call. Unable to leave message. Called patient's mobile number and left message for patient to call back.

## 2016-02-15 NOTE — Telephone Encounter (Signed)
Informed patient's wife of Dr. Kyla Balzarine message. Per Dr. [name], stop aspirin, continue plavix. Patient's wife verbalized understanding.

## 2016-02-15 NOTE — Telephone Encounter (Signed)
Stop aspirin; continue plavix

## 2016-02-15 NOTE — Telephone Encounter (Signed)
New Message  Pt c/o medication issue:  1. Name of Medication: Plavix & Aspirin  2. How are you currently taking this medication (dosage and times per day)? 75 mg once daily  3. Are you having a reaction (difficulty breathing--STAT)? No  4. What is your medication issue? Pts wife voiced he's bleeding and can't stop. Pt bleeds when he scratch's, shaving is problem, paper cut and it felt like he couldn't stop bleeding.  Pts wife voiced he's going to have to go to dentist and it would be a major problem prior approval.  Pts wife voiced concerning if he needs to stop Plavix or Aspirin that's causing pt to bleed uncontrollably.  Pts wife if she doesn't answer to leave a message on her voicemail.  Please f/u with pt

## 2016-02-16 ENCOUNTER — Encounter: Payer: Self-pay | Admitting: Dietician

## 2016-02-16 ENCOUNTER — Encounter: Payer: Commercial Managed Care - HMO | Attending: Internal Medicine | Admitting: Dietician

## 2016-02-16 DIAGNOSIS — R7302 Impaired glucose tolerance (oral): Secondary | ICD-10-CM | POA: Diagnosis not present

## 2016-02-16 DIAGNOSIS — I10 Essential (primary) hypertension: Secondary | ICD-10-CM

## 2016-02-16 DIAGNOSIS — I119 Hypertensive heart disease without heart failure: Secondary | ICD-10-CM | POA: Insufficient documentation

## 2016-02-16 DIAGNOSIS — Z713 Dietary counseling and surveillance: Secondary | ICD-10-CM | POA: Diagnosis not present

## 2016-02-16 DIAGNOSIS — E7800 Pure hypercholesterolemia, unspecified: Secondary | ICD-10-CM | POA: Insufficient documentation

## 2016-02-16 DIAGNOSIS — I241 Dressler's syndrome: Secondary | ICD-10-CM | POA: Diagnosis not present

## 2016-02-16 NOTE — Progress Notes (Signed)
  Medical Nutrition Therapy:  Appt start time: 0815 end time:  0930.   Assessment:  Primary concerns today: Patient is here with his wife regarding questions related to oxalate kidney stones, heart disease and hyperlipidemia.  Referral was for impaired glucose tolerance, HTN, hypercholesterolemia with heart diease and history of MI.  His wife has MS.  His last A1C was 5.5% 10/06/15 and last Glucose was 131 12/30/15 while in the ER with a kidney stone.  He also reports that his glucose increases when his is put on a statin.  He also has a hx of migraines triggered by sugar, caffeine, and light.  Weight hx: Patient has lost from 225 lbs in April 2017 to 208 lbs. He weighed 165 lbs when he was younger but healthier goal is 190 lbs currently.  He lost by watching what he ate.  Patient lives with his wife.  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  At that time, he was very sedentary and ate mostly fast food on the road.  Since the MI, he has changed his diet.  He now helps his son who builds race Runner, broadcasting/film/video.  Preferred Learning Style:   No preference indicated   Learning Readiness:   Ready  Change in progress  MEDICATIONS: see list to include Vitamin B-12 spray and magnesium malate.   DIETARY INTAKE:  Usual eating pattern includes 3 meals and 2-3 snacks per day.  Everyday foods include kashi snacks and granola.  Avoided foods include high oxalate foods.    24-hr recall:  B (7 AM): [place] with 4 T plain greek yogurt, milk, craisins, pineapple occasionally, banana,black coffee, lemon water  Snk ( AM): Kashi cookie  L (1PM): deli turkey/chicken on white bread, lite mayo, lettuce Snk ( PM): occasional kashi cookie or banana sandwich D (6-7 PM): salmon, baked potato, salad, slaw OR ground [place], broccoli, white beans, pasta and vegetables OR vege plate OR salad Snk ( PM): granola  Beverages: water, black coffee  Usual physical activity: walking 2-3  times per week  Estimated energy needs: 1800 calories 200 g carbohydrates 113 g protein 60 g fat  Progress Towards Goal(s):  In progress.   Nutritional Diagnosis:  NB-1.1 Food and nutrition-related knowledge deficit As related to kidney stones and balance of carbohydrate, protein, and fat..  As evidenced by diet hx and patient report.    Intervention:  Nutrition counseling/education regarding oxalate kidney stones, heart healthy, low sugar, low sodium diet.  They are following much of this already.  Answered questions related to drug nutrient interactions of meds and supplements.  Answered questions.  Encouraged increased activity and discussed the benefits.  Discussed supplementing vitamin D.  Continue with healthy eating and slow weight loss. Stay as active as possible.  Aim for at least 30 minutes most days. Continue low sodium, decreased meat. Consider Probiotic. Consider less fruit at breakfast and increase plain greek yogurt.   Teaching Method Utilized:  Visual Auditory Hands on  Handouts given during visit include: Kidney stone nutrition tips Kidney stone nutrition therapy  Weight management magazine  Barriers to learning/adherence to lifestyle change: none  Demonstrated degree of understanding via:  Teach Back   Monitoring/Evaluation:  Dietary intake, exercise, label reading, and body weight prn.

## 2016-02-16 NOTE — Patient Instructions (Addendum)
Continue with healthy eating and slow weight loss. Stay as active as possible.  Aim for at least 30 minutes most days. Continue low sodium, decreased meat. Consider Probiotic. Consider less fruit at breakfast and increase plain greek yogurt.

## 2016-02-22 NOTE — Progress Notes (Signed)
Patient ID: Johnny [name], male   DOB: November 15, 1950, 65 y.o.   MRN: [id number]    Cardiology Office Note   Date:  02/23/2016   ID:  Johnny [name], DOB Nov 13, 1950, MRN [id number]  PCP:  [name], MD  Cardiologist:  Dr. [name]    No chief complaint on file.     History of Present Illness: Johnny [name] is a 65 y.o. male who presents for post hospitalization 09/10/15 - 09/13/15 for chest pain.   He has a history of HTN, HLD, ? Prior TIA and GERD, but no known h/o CAD, admitted for CP with + troponin.   Per Dr. Gwenlyn Found "[name] has branch vessel disease and a high marginal branch and a first diagonal branch. These are small vessels in diameter though they are fairly long in length. He has moderate segmental mid LAD disease as well. At this point, I would recommend optimizing his antianginal medications. If he has continued chest pain he would be a candidate for angioplasty of his diagonal branch and marginal branch. " pk troponin 0.39.   Today he has no pain Discussed issues of statin, and nitrate intolerance. Need to try to advance medical Rx to avoid angioplasty/stent  Recent labs reviewed and normal LFTls LDL 88 HDL 37 TC 186  A1c 5.5   He and his wife had myriad of issues mostly revolving around side effects drugs:  Neurontin:  ? Cognitive changes only takes 300 at night been on it for 7 years for back pain Statin:  ? Still causing some muscle pain  Blurry Vision:  Worse since January but has not seen eye doctor   Last LDL after statin stopped 99 and was supposed to start repatha   Complained about easy bruising and we stopped his aspirin  Lab Results  Component Value Date   WBC 15.0 (H) 12/30/2015   HGB 15.1 12/30/2015   HCT 42.6 12/30/2015   MCV 93.0 12/30/2015   PLT 236 12/30/2015     Past Medical History:  Diagnosis Date  . Adenomatous polyps   . Bronchitis   . Chronic back pain   . GERD (gastroesophageal reflux disease)   . Hyperlipidemia   .  Hypertension   . Internal hemorrhoids   . Low testosterone   . Myocardial infarction   . Osteopenia   . Shortness of breath dyspnea   . Spondylosis   . TIA (transient ischemic attack) 2008  . Ureterolithiasis   . Ventral hernia   . Vitamin D deficiency     Past Surgical History:  Procedure Laterality Date  . BACK SURGERY     2 surgeries  . CARDIAC CATHETERIZATION N/A 09/12/2015   Procedure: Left Heart Cath and Coronary Angiography;  Surgeon: Lorretta Harp, MD;  Location: [facility];  Service: Cardiovascular;  Laterality: N/A;     Current Outpatient Prescriptions  Medication Sig Dispense Refill  . clopidogrel (PLAVIX) 75 MG tablet Take 1 tablet (75 mg total) by mouth daily. 30 tablet 6  . HYDROmorphone (DILAUDID) 4 MG tablet Take 1 tablet (4 mg total) by mouth every 4 (four) hours as needed for severe pain. 30 tablet 0  . MAGNESIUM MALATE PO Take 200 mg by mouth as directed.     . nebivolol (BYSTOLIC) 2.5 MG tablet Take 1 tablet (2.5 mg total) by mouth daily. 30 tablet 11  . nitroGLYCERIN (NITROSTAT) 0.4 MG SL tablet Place 1 tablet (0.4 mg total) under the tongue every 5 (five) minutes  as needed for chest pain. 30 tablet 0  . oxyCODONE-acetaminophen (PERCOCET) 10-650 MG tablet Take 1 tablet by mouth every 6 (six) hours as needed for pain.    Marland Kitchen PRALUENT 75 MG/ML SOPN as directed.    Marland Kitchen RANEXA 500 MG 12 hr tablet TAKE 1 TABLET BY MOUTH TWICE A DAY 60 tablet 3  . tamsulosin (FLOMAX) 0.4 MG CAPS capsule Take 0.4 mg by mouth daily as needed (Kidney stones).     . vitamin B-12 (CYANOCOBALAMIN) 500 MCG tablet Take 500 mcg by mouth daily. sublingual    . Calcium-Magnesium-Vitamin D (CALCIUM MAGNESIUM PO) Take by mouth as directed.      No current facility-administered medications for this visit.     Allergies:   Atorvastatin; Crestor [rosuvastatin calcium]; Levaquin [levofloxacin in d5w]; Metadate cd [methylphenidate hcl er (cd)]; Morphine and related; and Hydrocodone     Social History:  The patient  reports that he quit smoking about 38 years ago. His smoking use included Cigarettes. He quit after 10.00 years of use. He has never used smokeless tobacco. He reports that he does not drink alcohol or use drugs.   Family History:  The patient's family history includes Emphysema in his father; Heart attack in his mother; Heart disease in his mother; Stroke in his mother.    ROS:  General:no colds or fevers, no weight changes Skin:no rashes or ulcers HEENT:no blurred vision, no congestion CV:see HPI PUL:see HPI GI:no diarrhea constipation or melena, no indigestion GU:no hematuria, no dysuria MS:no joint pain, no claudication Neuro:no syncope, no lightheadedness Endo:no diabetes, no thyroid disease Wt Readings from Last 3 Encounters:  02/23/16 92.9 kg (204 lb 12.8 oz)  02/16/16 94.3 kg (208 lb)  02/07/16 94.8 kg (209 lb)     PHYSICAL EXAM: VS:  BP 118/82   Pulse 79   Ht 6' (1.829 m)   Wt 92.9 kg (204 lb 12.8 oz)   SpO2 98%   BMI 27.78 kg/m  , BMI Body mass index is 27.78 kg/m. General:Pleasant affect, NAD Skin:Warm and dry, brisk capillary refill HEENT:normocephalic, sclera clear, mucus membranes moist Neck:supple, no JVD, no bruits  Heart:S1S2 RRR without murmur, gallup, rub or click Lungs:clear without rales, rhonchi, or wheezes UEA:VWUJ, non tender, + BS, do not palpate liver spleen or masses Ext:no lower ext edema, 2+ pedal pulses, 2+ radial pulses Neuro:alert and oriented, MAE, follows commands, + facial symmetry    EKG:  09/11/15 SR rate 70 old IMI    Recent Labs: 11/30/2015: ALT 21 12/30/2015: BUN 12; Creatinine, Ser 1.30; Hemoglobin 15.1; Platelets 236; Potassium 3.5; Sodium 133    Lipid Panel    Component Value Date/Time   CHOL 187 11/30/2015 0753   TRIG 236 (H) 11/30/2015 0753   HDL 41 11/30/2015 0753   CHOLHDL 4.6 11/30/2015 0753   VLDL 47 (H) 11/30/2015 0753   LDLCALC 99 11/30/2015 0753       Other studies  Reviewed: Additional studies/ records that were reviewed today include: cath films .   ASSESSMENT AND PLAN:  1.  CAD: clinically stable  Nitrates cause migraines continue beta blocker and ranexa bystolic dose decreased for fatigue  2. Chol:  Target LDL 70 really intolerant to statins even qod.  Not taking repatha either fasting labs today  3. TIA: no carotid disease or PAF continue Plavix given concomitant CAD 4. Vision not clear why blurry doubt med effect refer to optometry  5. Renal:  Seen dietician avoiding calcium stable f/u urology   Current  medicines are reviewed with the patient today.  The patient Has no concerns regarding medicines.    Disposition:   FU:  With me 6 months   [name]

## 2016-02-23 ENCOUNTER — Encounter: Payer: Self-pay | Admitting: Cardiovascular Disease

## 2016-02-23 ENCOUNTER — Ambulatory Visit (INDEPENDENT_AMBULATORY_CARE_PROVIDER_SITE_OTHER)
Admission: RE | Admit: 2016-02-23 | Discharge: 2016-02-23 | Disposition: A | Discharge status: Home / Self Care | Payer: Commercial Managed Care - HMO | Source: Ambulatory Visit | Attending: Internal Medicine | Admitting: Internal Medicine

## 2016-02-23 ENCOUNTER — Other Ambulatory Visit: Payer: Self-pay

## 2016-02-23 ENCOUNTER — Ambulatory Visit (INDEPENDENT_AMBULATORY_CARE_PROVIDER_SITE_OTHER): Payer: Commercial Managed Care - HMO | Admitting: Cardiovascular Disease

## 2016-02-23 ENCOUNTER — Other Ambulatory Visit: Payer: Self-pay | Admitting: Pulmonary Disease

## 2016-02-23 VITALS — BP 118/82 | HR 79 | Ht 72.0 in | Wt 204.8 lb

## 2016-02-23 DIAGNOSIS — E7800 Pure hypercholesterolemia, unspecified: Secondary | ICD-10-CM

## 2016-02-23 DIAGNOSIS — R911 Solitary pulmonary nodule: Secondary | ICD-10-CM

## 2016-02-23 LAB — HEPATIC FUNCTION PANEL
ALBUMIN: 4.3 g/dL (ref 3.6–5.1)
ALK PHOS: 58 U/L (ref 40–115)
ALT: 16 U/L (ref 9–46)
AST: 17 U/L (ref 10–35)
BILIRUBIN TOTAL: 1 mg/dL (ref 0.2–1.2)
Bilirubin, Direct: 0.2 mg/dL (ref <=0.2)
Indirect Bilirubin: 0.8 mg/dL (ref 0.2–1.2)
TOTAL PROTEIN: 6.6 g/dL (ref 6.1–8.1)

## 2016-02-23 LAB — LIPID PANEL
CHOL/HDL RATIO: 4.8 ratio (ref <=5.0)
Cholesterol: 181 mg/dL (ref 125–200)
HDL: 38 mg/dL — AB (ref >=40)
LDL Cholesterol: 100 mg/dL (ref <130)
TRIGLYCERIDES: 214 mg/dL — AB (ref <150)
VLDL: 43 mg/dL — ABNORMAL HIGH (ref <30)

## 2016-02-23 NOTE — Patient Instructions (Addendum)
Medication Instructions:  Your physician recommends that you continue on your current medications as directed. Please refer to the Current Medication list given to you today.  Labwork: Your physician recommends that you have lab work today. Fasting lipid and liver panel  Testing/Procedures: NONE  Follow-Up: Your physician wants you to follow-up in: 6 months with Dr. [name]. You will receive a reminder letter in the mail two months in advance. If you don't receive a letter, please call our office to schedule the follow-up appointment.   If you need a refill on your cardiac medications before your next appointment, please call your pharmacy.

## 2016-03-14 ENCOUNTER — Other Ambulatory Visit: Payer: Self-pay | Admitting: Cardiovascular Disease

## 2016-03-14 MED ORDER — CLOPIDOGREL BISULFATE 75 MG PO TABS: 75.0000 mg | ORAL_TABLET | Freq: Every day | ORAL | 3 refills | Status: DC

## 2016-03-15 ENCOUNTER — Ambulatory Visit: Payer: Commercial Managed Care - HMO | Admitting: Internal Medicine

## 2016-04-19 ENCOUNTER — Ambulatory Visit (INDEPENDENT_AMBULATORY_CARE_PROVIDER_SITE_OTHER): Payer: Commercial Managed Care - HMO | Admitting: Internal Medicine

## 2016-04-19 ENCOUNTER — Encounter: Payer: Self-pay | Admitting: Internal Medicine

## 2016-04-19 VITALS — BP 128/80 | HR 66 | Temp 98.1°F | Ht 72.0 in | Wt 210.0 lb

## 2016-04-19 DIAGNOSIS — J0100 Acute maxillary sinusitis, unspecified: Secondary | ICD-10-CM | POA: Diagnosis not present

## 2016-04-19 MED ORDER — METHYLPREDNISOLONE ACETATE 80 MG/ML IJ SUSP
80.0000 mg | Freq: Once | INTRAMUSCULAR | Status: AC
  Administered 2016-04-19: 80 mg via INTRAMUSCULAR

## 2016-04-19 MED ORDER — AMOXICILLIN 500 MG PO CAPS: 500.0000 mg | ORAL_CAPSULE | Freq: Three times a day (TID) | ORAL | 0 refills | Status: DC

## 2016-04-19 NOTE — Progress Notes (Signed)
   Subjective:    Patient ID: Johnny [name], male    DOB: [birth year], 65 y.o.   MRN: [id number]  HPI Onset before Thanksgiving of URI symptoms. Wife has had similar illness. No fever or shaking chills. Has had discolored nasal drainage. Has some maxillary sinus pressure, malaise and fatigue.    Review of Systems see above     Objective:   Physical Exam He says nasally congested. Right TM obscured by cerumen. Left TM slightly full. Pharynx slightly injected without exudate. Neck is supple without adenopathy. Chest clear to auscultation without rales or wheezing.       Assessment & Plan:  Acute sinusitis  Plan: Depo-Medrol 80 mg IM. Amoxicillin 500 mg 3 times daily for 10 days.

## 2016-04-19 NOTE — Addendum Note (Signed)
Addended by: Amado Coe on: 04/19/2016 12:39 PM   Modules accepted: Orders

## 2016-04-19 NOTE — Patient Instructions (Signed)
Amoxicillin 500 mg 3 times daily x 10 days. Depomedrol 80 mg IM.

## 2016-05-23 ENCOUNTER — Other Ambulatory Visit: Payer: Self-pay | Admitting: Cardiology

## 2016-05-23 NOTE — Telephone Encounter (Signed)
REFIL 

## 2016-07-20 ENCOUNTER — Other Ambulatory Visit: Payer: Self-pay | Admitting: Cardiovascular Disease

## 2016-07-20 NOTE — Telephone Encounter (Signed)
I do not see where Dr [name] has ever filled this for the patient. Okay to refill? Please advise. Thanks, MI

## 2016-10-11 ENCOUNTER — Other Ambulatory Visit: Payer: Self-pay | Admitting: Cardiovascular Disease

## 2016-11-14 ENCOUNTER — Other Ambulatory Visit: Payer: Self-pay | Admitting: Cardiology

## 2016-12-03 ENCOUNTER — Telehealth: Payer: Self-pay | Admitting: Cardiovascular Disease

## 2016-12-03 NOTE — Telephone Encounter (Signed)
Consulted Dr. Marlou Porch (DOD) he does not think patient needs to hold plavix for a a dental cleaning. Spoke with Dr. Shanda Bumps office and bridge repair and replacement with cleaning should not involve any incision, but might require a shot of novocain for numbing due to pain. Will forward to Dr. Marlou Porch for further advisement.

## 2016-12-03 NOTE — Telephone Encounter (Signed)
° °  [facility] Pre-operative Risk Assessment    Request for surgical clearance:  1. What type of surgery is being performed?Bridge Repair and replacement also Dental Cleaning-Need this back today asap please 2. When is this surgery scheduled? 12-06-16   3. Are there any medications that need to be held prior to surgery and how long?Pt is on Plavix-need to know if pt needs to stop Plavix and if so when?Name of physician performing surgery?Dr Skip Mayer   4. What is your office phone and fax number? [phone] and fax is [phone]   Johnny [name] 12/03/2016, 10:40 AM  _________________________________________________________________   (provider comments below)

## 2016-12-04 ENCOUNTER — Telehealth: Payer: Self-pay | Admitting: Cardiovascular Disease

## 2016-12-04 NOTE — Telephone Encounter (Signed)
New message      What dental office are you calling from?  Dr Skip Mayer 1. What is your office phone and fax number?  Fax [phone]  2. What type of procedure is the patient having performed? extraction  What date is procedure scheduled? 12-10-16 3. What is your question (ex. Antibiotics prior to procedure, holding medication-we need to know how long dentist wants pt to hold med)?   Pt now needs to have an extraction.  Need clearance to stop plavix.  If yes, need to know when to stop and when to start.  Dentist office got clearance for cleaning but now pt needs extraction

## 2016-12-04 NOTE — Telephone Encounter (Signed)
Talked to [facility] with Dr. Shanda Bumps office. Patient is having a procedure of an extraction that will included having an incision. They will need clearance to stop plavix, and when to stop and restart.

## 2016-12-04 NOTE — Telephone Encounter (Signed)
No further recs. Continue plaix. OK to get novocane.  Candee Furbish, MD

## 2016-12-04 NOTE — Telephone Encounter (Signed)
Left message for Kenney Houseman to call back to discuss procedure.

## 2016-12-04 NOTE — Telephone Encounter (Signed)
Will fax to number provided.  

## 2016-12-05 NOTE — Telephone Encounter (Signed)
I would recommend continuing Plavix for a single extraction. He has a history of CVA. Candee Furbish, MD

## 2016-12-05 NOTE — Telephone Encounter (Signed)
Message left with Tonya at Dr. Marlou Porch office with the below response from Dr. Marlou Porch since their office is closed today.

## 2017-02-07 ENCOUNTER — Ambulatory Visit (INDEPENDENT_AMBULATORY_CARE_PROVIDER_SITE_OTHER): Payer: Self-pay | Admitting: Urgent Care

## 2017-02-07 ENCOUNTER — Encounter: Payer: Self-pay | Admitting: Urgent Care

## 2017-02-07 VITALS — BP 131/80 | HR 64 | Temp 98.3°F | Resp 16 | Ht 72.0 in | Wt 211.0 lb

## 2017-02-07 DIAGNOSIS — I10 Essential (primary) hypertension: Secondary | ICD-10-CM

## 2017-02-07 DIAGNOSIS — I252 Old myocardial infarction: Secondary | ICD-10-CM

## 2017-02-07 DIAGNOSIS — Z8673 Personal history of transient ischemic attack (TIA), and cerebral infarction without residual deficits: Secondary | ICD-10-CM

## 2017-02-07 DIAGNOSIS — Z024 Encounter for examination for driving license: Secondary | ICD-10-CM

## 2017-02-07 DIAGNOSIS — E782 Mixed hyperlipidemia: Secondary | ICD-10-CM

## 2017-02-07 DIAGNOSIS — I519 Heart disease, unspecified: Secondary | ICD-10-CM

## 2017-02-07 NOTE — Patient Instructions (Addendum)
Keeping you healthy  Get these tests  Blood pressure- Have your blood pressure checked once a year by your healthcare provider.  Normal blood pressure is 120/80  Weight- Have your body mass index (BMI) calculated to screen for obesity.  BMI is a measure of body fat based on height and weight. You can also calculate your own BMI at www.nhl[facility].com/bmi/.  Cholesterol- Have your cholesterol checked every year.  Diabetes- Have your blood sugar checked regularly if you have high blood pressure, high cholesterol, have a family history of diabetes or if you are overweight.  Screening for Colon Cancer- Colonoscopy starting at age 50.  Screening may begin sooner depending on your family history and other health conditions. Follow up colonoscopy as directed by your Gastroenterologist.  Screening for Prostate Cancer- Both blood work (PSA) and a rectal exam help screen for Prostate Cancer.  Screening begins at age 40 with African-American men and at age 50 with Caucasian men.  Screening may begin sooner depending on your family history.  Take these medicines  Aspirin- One aspirin daily can help prevent Heart disease and Stroke.  Flu shot- Every fall.  Tetanus- Every 10 years.  Zostavax- Once after the age of 60 to prevent Shingles.  Pneumonia shot- Once after the age of 65; if you are younger than 65, ask your healthcare provider if you need a Pneumonia shot.  Take these steps  Don't smoke- If you do smoke, talk to your doctor about quitting.  For tips on how to quit, go to www.smokefree.gov or call 1-800-QUIT-NOW.  Be physically active- Exercise 5 days a week for at least 30 minutes.  If you are not already physically active start slow and gradually work up to 30 minutes of moderate physical activity.  Examples of moderate activity include walking briskly, mowing the yard, dancing, swimming, bicycling, etc.  Eat a healthy diet- Eat a variety of healthy food such as fruits, vegetables,  low fat milk, low fat cheese, yogurt, lean meant, poultry, fish, beans, tofu, etc. For more information go to www.thenutritionsource.org  Drink alcohol in moderation- Limit alcohol intake to less than two drinks a day. Never drink and drive.  Dentist- Brush and floss twice daily; visit your dentist twice a year.  Depression- Your emotional health is as important as your physical health. If you're feeling down, or losing interest in things you would normally enjoy please talk to your healthcare provider.  Eye exam- Visit your eye doctor every year.  Safe sex- If you may be exposed to a sexually transmitted infection, use a condom.  Seat belts- Seat belts can save your life; always wear one.  Smoke/Carbon Monoxide detectors- These detectors need to be installed on the appropriate level of your home.  Replace batteries at least once a year.  Skin cancer- When out in the sun, cover up and use sunscreen 15 SPF or higher.  Violence- If anyone is threatening you, please tell your healthcare provider.  Living Will/ Health care power of attorney- Speak with your healthcare provider and family.   IF you received an x-ray today, you will receive an invoice from [facility]. Please contact [facility] at [phone] with questions or concerns regarding your invoice.   IF you received labwork today, you will receive an invoice from [facility]. Please contact [facility] at [phone] with questions or concerns regarding your invoice.   Our billing staff will not be able to assist you with questions regarding bills from these companies.  You will be contacted   with the lab results as soon as they are available. The fastest way to get your results is to activate your My Chart account. Instructions are located on the last page of this paperwork. If you have not heard from us regarding the results in 2 weeks, please contact this office.     

## 2017-02-07 NOTE — Progress Notes (Signed)
  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 Patient has a history of MI, TIA, CVA, HTN, HL. Currently, patient is managed medically with Plavix, Bystolic, Nitrostat as needed, Ranexa. Last OV with his cardiologist was 02/23/2016, and had no concerns regarding medications. Patient was stable, follow up recommended in 6 months. Has not had to use any nitroglycerin. Today, denies dizziness, chronic headache, blurred vision, chest pain, shortness of breath, heart racing, palpitations, nausea, vomiting, abdominal pain, hematuria, lower leg swelling. Denies smoking cigarettes or drinking alcohol.   The following portions of the patient's history were reviewed and updated as appropriate: allergies, current medications, past family history, past medical history, past social history and past surgical history.  Objective:   BP 131/80   Pulse 64   Temp 98.3 F (36.8 C) (Oral)   Resp 16   Ht 6' (1.829 m)   Wt 211 lb (95.7 kg)   SpO2 97%   BMI 28.62 kg/m   Vision/hearing:  Visual Acuity Screening   Right eye Left eye Both eyes  Without correction:     With correction: 20/13 20/13 20/13   Comments: Colors:6/6 pass   Titmus:85 pass  Hearing Screening Comments: Whisper test: 42ft  Patient can recognize and distinguish among traffic control signals and devices showing standard red, green, and amber colors.  Corrective lenses required: Yes  Monocular Vision?: No  Hearing aid requirement: No  Physical Exam  Constitutional: He is oriented to person, place, and time. He appears well-developed and well-nourished.  HENT:  TM's intact bilaterally, no effusions or erythema. Nasal turbinates pink and moist, nasal passages patent. No sinus tenderness. Oropharynx clear, mucous membranes moist, dentition in good repair.  Eyes: Pupils are equal, round, and reactive to light. Conjunctivae and EOM are normal. Right eye exhibits  no discharge. Left eye exhibits no discharge. No scleral icterus.  Neck: Normal range of motion. Neck supple.  Cardiovascular: Normal rate, regular rhythm and intact distal pulses.  Exam reveals no gallop and no friction rub.   No murmur heard. Pulmonary/Chest: No stridor. No respiratory distress. He has no wheezes. He has no rales.  Abdominal: Soft. Bowel sounds are normal. He exhibits no distension and no mass. There is no tenderness.  Musculoskeletal: Normal range of motion. He exhibits no edema or tenderness.  Lymphadenopathy:    He has no cervical adenopathy.  Neurological: He is alert and oriented to person, place, and time. He has normal reflexes. He displays normal reflexes. Coordination normal.  Skin: Skin is warm and dry. No rash noted. No erythema. No pallor.  Psychiatric: He has a normal mood and affect.   Labs: Comments: spg: 1.020, blood: neg, pro: neg, sugar: neg  Assessment:    Healthy male exam.  Meets standards, but periodic monitoring required due to heart disease, HTN, HL, history of CVA and TIA.  Driver qualified only for 1 year.    Plan:   Medical examiners certificate completed and printed. Return as needed.  Jaynee Eagles, PA-C [facility] [phone] 02/07/2017  4:43 PM

## 2017-03-14 ENCOUNTER — Other Ambulatory Visit: Payer: Self-pay | Admitting: Cardiovascular Disease

## 2017-03-22 ENCOUNTER — Other Ambulatory Visit: Payer: Self-pay | Admitting: Cardiovascular Disease

## 2017-03-25 ENCOUNTER — Other Ambulatory Visit: Payer: Self-pay | Admitting: *Deleted

## 2017-03-25 ENCOUNTER — Telehealth: Payer: Self-pay | Admitting: Cardiovascular Disease

## 2017-03-25 MED ORDER — NEBIVOLOL HCL 2.5 MG PO TABS: 2.5000 mg | ORAL_TABLET | Freq: Every day | ORAL | 0 refills | Status: DC

## 2017-03-25 NOTE — Telephone Encounter (Signed)
I have refilled the bystolic for #95 which will last until patients appointment. Ranexa and clopidogrel have refills to get the patient past his appointment date.

## 2017-03-25 NOTE — Telephone Encounter (Signed)
New message    Pt wife wants a call back if they can not be filled.   *STAT* If patient is at the pharmacy, call can be transferred to refill team.   1. Which medications need to be refilled? (please list name of each medication and dose if known) bystolic, ranexa, plavix  2. Which pharmacy/location (including street and city if local pharmacy) is medication to be sent to? [facility] [place]   3. Do they need a 30 day or 90 day supply? 30 day

## 2017-04-19 NOTE — Progress Notes (Signed)
Cardiology Office Note   Date:  04/22/2017   ID:  [name], DOB 17-Jan-1951, MRN [id number]  PCP:  [name], MD  Cardiologist:  Dr. [name]     Chief Complaint  Patient presents with  . Chest Pain    CAD      History of Present Illness: Johnny [name] is a 66 y.o. male who presents for CAD.    Pt with hx of HTN, HLD, poss priot TIA, and GERD then admitted 2017 with + troponin and chest pain.  Per Dr. Gwenlyn Found "mr. Johnny [name] has branch vessel disease and a high marginal branch and a first diagonal branch. These are small vessels in diameter though they are fairly long in length. He has moderate segmental mid LAD disease as well. At this point, I would recommend optimizing his antianginal medications. If he has continued chest pain he would be a candidate for angioplasty of his diagonal branch and marginal branch. " pk troponin 0.39.   Last visit 1 year ago with no chest pain.   Hx of nitrates causing migraines.  Has been on ranexa and bystolic.  Intolerant to statin  With climb in LDL pt is back on Pruluent.   Today he does admit to chest pain when he pushes himself with no associated symptoms, and happens once a week.  May las 5 to 45 min, and he has not taken NTG.  No SOB.    He is not taking Praluent due to muscle pain and flu like symptoms.  He tries to eat healthy.  He is on ranexa as well.     Past Medical History:  Diagnosis Date  . Adenomatous polyps   . Bronchitis   . Chronic back pain   . GERD (gastroesophageal reflux disease)   . Hyperlipidemia   . Hypertension   . Internal hemorrhoids   . Low testosterone   . Myocardial infarction ([place])   . Osteopenia   . Shortness of breath dyspnea   . Spondylosis   . TIA (transient ischemic attack) 2008  . Ureterolithiasis   . Ventral hernia   . Vitamin D deficiency     Past Surgical History:  Procedure Laterality Date  . BACK SURGERY     2 surgeries  . CARDIAC CATHETERIZATION N/A 09/12/2015   Procedure:  Left Heart Cath and Coronary Angiography;  Surgeon: Lorretta Harp, MD;  Location: [facility];  Service: Cardiovascular;  Laterality: N/A;     Current Outpatient Medications  Medication Sig Dispense Refill  . Calcium-Magnesium-Vitamin D (CALCIUM MAGNESIUM PO) Take by mouth as directed.     . clopidogrel (PLAVIX) 75 MG tablet TAKE 1 TABLET (75 MG TOTAL) BY MOUTH DAILY. 90 tablet 0  . MAGNESIUM MALATE PO Take 200 mg by mouth as directed.     . nebivolol (BYSTOLIC) 2.5 MG tablet Take 1 tablet (2.5 mg total) daily by mouth. Patient must keep upcoming appointment for further refills 30 tablet 0  . nitroGLYCERIN (NITROSTAT) 0.4 MG SL tablet Place 1 tablet (0.4 mg total) under the tongue every 5 (five) minutes as needed for chest pain. No more than 3 tablets within 15 minutes. 25 tablet 2  . oxyCODONE-acetaminophen (PERCOCET) 10-325 MG tablet Take 10-325 tablets by mouth as directed. Takes 4-6 a day    . RANEXA 500 MG 12 hr tablet TAKE 1 TABLET BY MOUTH TWICE A DAY 60 tablet 5  . tamsulosin (FLOMAX) 0.4 MG CAPS capsule Take 0.4 mg by mouth  daily as needed (Kidney stones).     . vitamin B-12 (CYANOCOBALAMIN) 500 MCG tablet Take 500 mcg by mouth daily. sublingual     No current facility-administered medications for this visit.     Allergies:   Atorvastatin; Crestor [rosuvastatin calcium]; Levaquin [levofloxacin in d5w]; Metadate cd [methylphenidate hcl er (cd)]; Morphine and related; and Hydrocodone    Social History:  The patient  reports that he quit smoking about 39 years ago. His smoking use included cigarettes. He quit after 10.00 years of use. he has never used smokeless tobacco. He reports that he does not drink alcohol or use drugs.   Family History:  The patient's family history includes Emphysema in his father; Heart attack in his mother; Heart disease in his mother; Stroke in his mother.    ROS:  General:no colds or fevers, no weight changes Skin:no rashes or ulcers HEENT:no  blurred vision, no congestion CV:see HPI PUL:see HPI GI:no diarrhea constipation or melena, no indigestion GU:no hematuria, no dysuria MS:no joint pain, no claudication Neuro:no syncope, no lightheadedness Endo:no diabetes, no thyroid disease  Wt Readings from Last 3 Encounters:  04/22/17 221 lb 1.9 oz (100.3 kg)  02/07/17 211 lb (95.7 kg)  04/19/16 210 lb (95.3 kg)     PHYSICAL EXAM: VS:  BP 120/72   Pulse (!) 59   Resp 16   Ht 6' (1.829 m)   Wt 221 lb 1.9 oz (100.3 kg)   BMI 29.99 kg/m  , BMI Body mass index is 29.99 kg/m. General:Pleasant affect, NAD Skin:Warm and dry, brisk capillary refill HEENT:normocephalic, sclera clear, mucus membranes moist Neck:supple, no JVD, no bruits  Heart:S1S2 RRR without murmur, gallup, rub or click Lungs:clear without rales, rhonchi, or wheezes WUJ:WJXB, non tender, + BS, do not palpate liver spleen or masses Ext:no lower ext edema, 2+ pedal pulses, 2+ radial pulses Neuro:alert and oriented X 3, MAE, follows commands, + facial symmetry    EKG:  EKG is ordered today. The ekg ordered today demonstrates SB and normal EKG.     Recent Labs: No results found for requested labs within last 8760 hours.    Lipid Panel    Component Value Date/Time   CHOL 181 02/23/2016 0831   TRIG 214 (H) 02/23/2016 0831   HDL 38 (L) 02/23/2016 0831   CHOLHDL 4.8 02/23/2016 0831   VLDL 43 (H) 02/23/2016 0831   LDLCALC 100 02/23/2016 0831       Other studies Reviewed: Additional studies/ records that were reviewed today include: . 09/11/16 Echo Study Conclusions  - Left ventricle: The cavity size was normal. Wall thickness was   normal. Systolic function was normal. The estimated ejection   fraction was in the range of 55% to 60%. Wall motion was normal;   there were no regional wall motion abnormalities. Doppler   parameters are consistent with abnormal left ventricular   relaxation (grade 1 diastolic dysfunction). - Aortic root: The aortic  root was mildly dilated.  Impressions:  - Normal LV systolic function; probable mild diastolic dysfunction;   trace MR.  Cardiac cath 09/12/15  Procedures   Left Heart Cath and Coronary Angiography  Conclusion    Ramus lesion, 95% stenosed.  1st Diag lesion, 90% stenosed.  Mid LAD to Dist LAD lesion, 50% stenosed.  The left ventricular systolic function is normal.       ASSESSMENT AND PLAN:  1.  Angina that overall has increased from last year.  He has not tried NTG.  He would like  to try gentle exercise and see if this helps. We discussed adding imdur or increasing ranexa, he would prefer to wait with the understanding if pain increases he would call and plan would be for cath.  He and his wife are agreeable for this.  Follow up with Dr. [name] in 3 months.  2.  CAD see above.  3.  HLD is not on meds due to side effects.    4.  Hx TIA no carotid disease.   Current medicines are reviewed with the patient today.  The patient Has no concerns regarding medicines.  The following changes have been made:  See above Labs/ tests ordered today include:see above  Disposition:   FU:  see above  Signed, Cecilie Kicks, NP  04/22/2017 1:27 PM    [facility] [place], [address], [place], [place] Phone: [phone]; Fax: [phone]

## 2017-04-22 ENCOUNTER — Encounter (INDEPENDENT_AMBULATORY_CARE_PROVIDER_SITE_OTHER): Payer: Self-pay

## 2017-04-22 ENCOUNTER — Encounter: Payer: Self-pay | Admitting: Cardiology

## 2017-04-22 ENCOUNTER — Ambulatory Visit: Payer: 59 | Admitting: Cardiology

## 2017-04-22 VITALS — BP 120/72 | HR 59 | Resp 16 | Ht 72.0 in | Wt 221.1 lb

## 2017-04-22 DIAGNOSIS — E7800 Pure hypercholesterolemia, unspecified: Secondary | ICD-10-CM | POA: Diagnosis not present

## 2017-04-22 DIAGNOSIS — Z8673 Personal history of transient ischemic attack (TIA), and cerebral infarction without residual deficits: Secondary | ICD-10-CM

## 2017-04-22 DIAGNOSIS — I209 Angina pectoris, unspecified: Secondary | ICD-10-CM | POA: Diagnosis not present

## 2017-04-22 DIAGNOSIS — I25110 Atherosclerotic heart disease of native coronary artery with unstable angina pectoris: Secondary | ICD-10-CM | POA: Diagnosis not present

## 2017-04-22 NOTE — Patient Instructions (Signed)
Medication Instructions:   Your physician recommends that you continue on your current medications as directed. Please refer to the Current Medication list given to you today.   If you need a refill on your cardiac medications before your next appointment, please call your pharmacy.  Labwork:  NONE ORDERED  TODAY    Testing/Procedures: NONE ORDERED  TODAY   Follow-Up:  IN 3 MONTHS WITH DR [name]    Any Other Special Instructions Will Be Listed Below (If Applicable).

## 2017-04-30 ENCOUNTER — Other Ambulatory Visit: Payer: Self-pay | Admitting: Cardiovascular Disease

## 2017-05-11 ENCOUNTER — Other Ambulatory Visit: Payer: Self-pay | Admitting: Cardiovascular Disease

## 2017-06-11 ENCOUNTER — Other Ambulatory Visit: Payer: Self-pay | Admitting: Cardiovascular Disease

## 2017-07-15 NOTE — Progress Notes (Deleted)
Cardiology Office Note   Date:  07/15/2017   ID:  Johnny [name], DOB 26-Jul-1950, MRN [id number]  PCP:  [name], MD  Cardiologist:  Dr. [name]     No chief complaint on file.     History of Present Illness:  67 y.o. history OTN, HLD, TIA.  Admitted with [facility] 09/12/15 Rx medically with small/branch vessel disease Reviewed films: 90% small ramus and D1 with moderate mid LAD disease Nitrates can cause headache Intolerant to statins but is on Praluent    ***    Past Medical History:  Diagnosis Date  . Adenomatous polyps   . Bronchitis   . Chronic back pain   . GERD (gastroesophageal reflux disease)   . Hyperlipidemia   . Hypertension   . Internal hemorrhoids   . Low testosterone   . Myocardial infarction ([place])   . Osteopenia   . Shortness of breath dyspnea   . Spondylosis   . TIA (transient ischemic attack) 2008  . Ureterolithiasis   . Ventral hernia   . Vitamin D deficiency     Past Surgical History:  Procedure Laterality Date  . BACK SURGERY     2 surgeries  . CARDIAC CATHETERIZATION N/A 09/12/2015   Procedure: Left Heart Cath and Coronary Angiography;  Surgeon: Lorretta Harp, MD;  Location: [facility];  Service: Cardiovascular;  Laterality: N/A;     Current Outpatient Medications  Medication Sig Dispense Refill  . BYSTOLIC 2.5 MG tablet TAKE 1 TABLET BY MOUTH EVERY DAY MUST KEEP APPOINTMENT FOR FURTHER REFILL 30 tablet 11  . Calcium-Magnesium-Vitamin D (CALCIUM MAGNESIUM PO) Take by mouth as directed.     . clopidogrel (PLAVIX) 75 MG tablet TAKE 1 TABLET BY MOUTH EVERY DAY 90 tablet 3  . MAGNESIUM MALATE PO Take 200 mg by mouth as directed.     . nitroGLYCERIN (NITROSTAT) 0.4 MG SL tablet Place 1 tablet (0.4 mg total) under the tongue every 5 (five) minutes as needed for chest pain. No more than 3 tablets within 15 minutes. 25 tablet 2  . oxyCODONE-acetaminophen (PERCOCET) 10-325 MG tablet Take 10-325 tablets by mouth as directed. Takes 4-6  a day    . RANEXA 500 MG 12 hr tablet TAKE 1 TABLET BY MOUTH TWICE A DAY 180 tablet 3  . tamsulosin (FLOMAX) 0.4 MG CAPS capsule Take 0.4 mg by mouth daily as needed (Kidney stones).     . vitamin B-12 (CYANOCOBALAMIN) 500 MCG tablet Take 500 mcg by mouth daily. sublingual     No current facility-administered medications for this visit.     Allergies:   Atorvastatin; Crestor [rosuvastatin calcium]; Levaquin [levofloxacin in d5w]; Metadate cd [methylphenidate hcl er (cd)]; Morphine and related; and Hydrocodone    Social History:  The patient  reports that he quit smoking about 39 years ago. His smoking use included cigarettes. He quit after 10.00 years of use. he has never used smokeless tobacco. He reports that he does not drink alcohol or use drugs.   Family History:  The patient's family history includes Emphysema in his father; Heart attack in his mother; Heart disease in his mother; Stroke in his mother.    ROS:  General:no colds or fevers, no weight changes Skin:no rashes or ulcers HEENT:no blurred vision, no congestion CV:see HPI PUL:see HPI GI:no diarrhea constipation or melena, no indigestion GU:no hematuria, no dysuria MS:no joint pain, no claudication Neuro:no syncope, no lightheadedness Endo:no diabetes, no thyroid disease  Wt Readings from Last  3 Encounters:  04/22/17 221 lb 1.9 oz (100.3 kg)  02/07/17 211 lb (95.7 kg)  04/19/16 210 lb (95.3 kg)     PHYSICAL EXAM: VS:  There were no vitals taken for this visit. , BMI There is no height or weight on file to calculate BMI. Affect appropriate Healthy:  appears stated age 26: normal Neck supple with no adenopathy JVP normal no bruits no thyromegaly Lungs clear with no wheezing and good diaphragmatic motion Heart:  S1/S2 no murmur, no rub, gallop or click PMI normal Abdomen: benighn, BS positve, no tenderness, no AAA no bruit.  No HSM or HJR Distal pulses intact with no bruits No edema Neuro non-focal Skin  warm and dry No muscular weakness     EKG:   04/22/17 SB and normal EKG.     Recent Labs: No results found for requested labs within last 8760 hours.    Lipid Panel    Component Value Date/Time   CHOL 181 02/23/2016 0831   TRIG 214 (H) 02/23/2016 0831   HDL 38 (L) 02/23/2016 0831   CHOLHDL 4.8 02/23/2016 0831   VLDL 43 (H) 02/23/2016 0831   LDLCALC 100 02/23/2016 0831       Other studies Reviewed: Additional studies/ records that were reviewed today include: . 09/11/16 Echo Study Conclusions  - Left ventricle: The cavity size was normal. Wall thickness was   normal. Systolic function was normal. The estimated ejection   fraction was in the range of 55% to 60%. Wall motion was normal;   there were no regional wall motion abnormalities. Doppler   parameters are consistent with abnormal left ventricular   relaxation (grade 1 diastolic dysfunction). - Aortic root: The aortic root was mildly dilated.  Impressions:  - Normal LV systolic function; probable mild diastolic dysfunction;   trace MR.  Cardiac cath 09/12/15  Procedures   Left Heart Cath and Coronary Angiography  Conclusion    Ramus lesion, 95% stenosed.  1st Diag lesion, 90% stenosed.  Mid LAD to Dist LAD lesion, 50% stenosed.  The left ventricular systolic function is normal.       ASSESSMENT AND PLAN:  1.  CAD stable continue medical Rx small/branch vessel disease   2.  HLD is not on meds due to side effects.  Intolerant to statins Praluent ***  3.  Hx TIA no carotid disease. Plavix  and lipid management    Current medicines are reviewed with the patient today.  The patient Has no concerns regarding medicines.  The following changes have been made:  See above Labs/ tests ordered today include:see above  Disposition:   FU:  see above  Signed, [name], MD  07/15/2017 1:24 PM    [facility] [place], [place], [place]  [place], [place] Phone: [phone]; Fax: [phone]

## 2017-07-22 ENCOUNTER — Ambulatory Visit: Payer: 59 | Admitting: Cardiovascular Disease

## 2017-11-18 NOTE — Progress Notes (Signed)
Cardiology Office Note   Date:  11/19/2017   ID:  Johnny [name], DOB 12/21/1950, MRN [id number]  PCP:  Johnny Showers, MD  Cardiologist:  Dr. [name]    Chief Complaint  Patient presents with  . Coronary Artery Disease      History of Present Illness: Johnny [name] is a 67 y.o. male who presents for CAD, small vessel disease.   Pt with hx of HTN, HLD, poss priot TIA, and GERD then admitted 2017 with + troponin and chest pain.  Per Dr. Gwenlyn Found "mr. Johnny [name] has branch vessel disease and a high marginal branch and a first diagonal branch. These are small vessels in diameter though they are fairly long in length. He has moderate segmental mid LAD disease as well. At this point, I would recommend optimizing his antianginal medications. If he has continued chest pain he would be a candidate for angioplasty of his diagonal branch and marginal branch. " pk troponin 0.39.   Last visit 1 year ago with no chest pain.   Hx of nitrates causing migraines.  Has been on ranexa and bystolic.  Intolerant to statin  With climb in LDL pt is back on Pruluent.   On prior visit  he does admit to chest pain when he pushes himself with no associated symptoms, and happens once a week.  May las 5 to 45 min, and he has not taken NTG.  No SOB.    He is not taking Praluent due to muscle pain and flu like symptoms.  He is on ranexa as well.  On last visit 05/19/17 he ha chest pain see above but did not wish to have increase of meds.    Today he continues with episodic chest pain, maybe twice a week.   He also admits to eating foods that cause indigestion.  At times his chest pain is with indigestion.  Other times with overexertion and and increased heat.  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  We discussed stretching before and after.  Also he is active but no special exercise,  They have joined a gym, his wife has MS, so family has joined, and he is doing the treadmill.   Encouraged water aerobics.  When he has pain he will take extra ASA.  Still not interested in increasing routine meds- ranexa.  It is time for lipid eval.   Past Medical History:  Diagnosis Date  . Adenomatous polyps   . Bronchitis   . Chronic back pain   . GERD (gastroesophageal reflux disease)   . Hyperlipidemia   . Hypertension   . Internal hemorrhoids   . Low testosterone   . Myocardial infarction ([place])   . Osteopenia   . Shortness of breath dyspnea   . Spondylosis   . TIA (transient ischemic attack) 2008  . Ureterolithiasis   . Ventral hernia   . Vitamin D deficiency     Past Surgical History:  Procedure Laterality Date  . BACK SURGERY     2 surgeries  . CARDIAC CATHETERIZATION N/A 09/12/2015   Procedure: Left Heart Cath and Coronary Angiography;  Surgeon: Lorretta Harp, MD;  Location: [facility];  Service: Cardiovascular;  Laterality: N/A;     Current Outpatient Medications  Medication Sig Dispense Refill  . BYSTOLIC 2.5 MG tablet TAKE 1 TABLET BY MOUTH EVERY DAY MUST KEEP APPOINTMENT FOR FURTHER REFILL 30 tablet 11  . Calcium-Magnesium-Vitamin D (CALCIUM MAGNESIUM PO)  Take by mouth as directed.     . clopidogrel (PLAVIX) 75 MG tablet TAKE 1 TABLET BY MOUTH EVERY DAY 90 tablet 3  . MAGNESIUM MALATE PO Take 200 mg by mouth as directed.     . nitroGLYCERIN (NITROSTAT) 0.4 MG SL tablet Place 1 tablet (0.4 mg total) under the tongue every 5 (five) minutes as needed for chest pain. No more than 3 tablets within 15 minutes. 25 tablet 2  . oxyCODONE-acetaminophen (PERCOCET) 10-325 MG tablet Take 10-325 tablets by mouth as directed. Takes 4-6 a day    . RANEXA 500 MG 12 hr tablet TAKE 1 TABLET BY MOUTH TWICE A DAY 180 tablet 3  . vitamin B-12 (CYANOCOBALAMIN) 500 MCG tablet Take 500 mcg by mouth daily. sublingual     No current facility-administered medications for this visit.     Allergies:   Atorvastatin; Crestor [rosuvastatin calcium]; Metadate cd  [methylphenidate hcl er (cd)]; Other; Pravastatin; Rosuvastatin calcium; Hydrocodone; Levofloxacin; Levofloxacin in d5w; and Morphine and related    Social History:  The patient  reports that he quit smoking about 39 years ago. His smoking use included cigarettes. He quit after 10.00 years of use. He has never used smokeless tobacco. He reports that he does not drink alcohol or use drugs.   Family History:  The patient's family history includes Emphysema in his father; Heart attack in his mother; Heart disease in his mother; Stroke in his mother.    ROS:  General:no colds or fevers, no weight changes Skin:no rashes or ulcers HEENT:no blurred vision, no congestion CV:see HPI PUL:see HPI GI:no diarrhea constipation or melena, + indigestion GU:no hematuria, no dysuria MS:no joint pain, no claudication Neuro:no syncope, no lightheadedness Endo:no diabetes, no thyroid disease  Wt Readings from Last 3 Encounters:  11/19/17 212 lb (96.2 kg)  04/22/17 221 lb 1.9 oz (100.3 kg)  02/07/17 211 lb (95.7 kg)     PHYSICAL EXAM: VS:  BP 128/82   Pulse (!) 58   Ht 6' (1.829 m)   Wt 212 lb (96.2 kg)   SpO2 98%   BMI 28.75 kg/m  , BMI Body mass index is 28.75 kg/m. General:Pleasant affect, NAD Skin:Warm and dry, brisk capillary refill HEENT:normocephalic, sclera clear, mucus membranes moist Neck:supple, no JVD, no bruits  Heart:S1S2 RRR without murmur, gallup, rub or click Lungs:clear without rales, rhonchi, or wheezes PPI:RJJO, non tender, + BS, do not palpate liver spleen or masses Ext:no lower ext edema, 2+ pedal pulses, 2+ radial pulses Neuro:alert and oriented X 3, MAE, follows commands, + facial symmetry    EKG:  EKG is NOT ordered today.   Recent Labs: No results found for requested labs within last 8760 hours.    Lipid Panel    Component Value Date/Time   CHOL 181 02/23/2016 0831   TRIG 214 (H) 02/23/2016 0831   HDL 38 (L) 02/23/2016 0831   CHOLHDL 4.8 02/23/2016 0831     VLDL 43 (H) 02/23/2016 0831   LDLCALC 100 02/23/2016 0831       Other studies Reviewed: Additional studies/ records that were reviewed today include:  09/11/16 Echo Study Conclusions  - Left ventricle: The cavity size was normal. Wall thickness was normal. Systolic function was normal. The estimated ejection fraction was in the range of 55% to 60%. Wall motion was normal; there were no regional wall motion abnormalities. Doppler parameters are consistent with abnormal left ventricular relaxation (grade 1 diastolic dysfunction). - Aortic root: The aortic root was mildly dilated.  Impressions:  - Normal LV systolic function; probable mild diastolic dysfunction; trace MR.  Cardiac cath 09/12/15  Procedures   Left Heart Cath and Coronary Angiography  Conclusion    Ramus lesion, 95% stenosed.  1st Diag lesion, 90% stenosed.  Mid LAD to Dist LAD lesion, 50% stenosed.  The left ventricular systolic function is normal.    ASSESSMENT AND PLAN:  1.  Angina still episodic but stable and resolves with ASA, also may have a GI component.  Again does not wish to increase ranexa currently.  He will call if symptoms increase. On plavix.  Follow up with Dr. [name] in 6 months.   2.  CAD with cath in 2018, plan to treat medically but PCI could be done.  Currently stable with meds.  3.   HLD, intolerant to statins and Pravulent. Will check lipids.   4.   Hx TIA on plavix.  No carotid disease.    5.   GERD may be part of angina, instructed to use prilosec or pepcid prn.  Watch diet.    Current medicines are reviewed with the patient today.  The patient Has no concerns regarding medicines.  The following changes have been made:  See above Labs/ tests ordered today include:see above  Disposition:   FU:  see above  Signed, Cecilie Kicks, NP  11/19/2017 8:41 AM    [facility], [place], [place], [place] Phone: [phone]; Fax: [phone]

## 2017-11-19 ENCOUNTER — Ambulatory Visit: Payer: 59 | Admitting: Cardiology

## 2017-11-19 ENCOUNTER — Encounter (INDEPENDENT_AMBULATORY_CARE_PROVIDER_SITE_OTHER): Payer: Self-pay

## 2017-11-19 ENCOUNTER — Encounter: Payer: Self-pay | Admitting: Cardiology

## 2017-11-19 VITALS — BP 128/82 | HR 58 | Ht 72.0 in | Wt 212.0 lb

## 2017-11-19 DIAGNOSIS — K219 Gastro-esophageal reflux disease without esophagitis: Secondary | ICD-10-CM

## 2017-11-19 DIAGNOSIS — E7800 Pure hypercholesterolemia, unspecified: Secondary | ICD-10-CM | POA: Diagnosis not present

## 2017-11-19 DIAGNOSIS — I25110 Atherosclerotic heart disease of native coronary artery with unstable angina pectoris: Secondary | ICD-10-CM | POA: Diagnosis not present

## 2017-11-19 DIAGNOSIS — Z8673 Personal history of transient ischemic attack (TIA), and cerebral infarction without residual deficits: Secondary | ICD-10-CM | POA: Diagnosis not present

## 2017-11-19 DIAGNOSIS — I209 Angina pectoris, unspecified: Secondary | ICD-10-CM

## 2017-11-19 LAB — HEPATIC FUNCTION PANEL
ALT: 15 IU/L (ref 0–44)
AST: 12 IU/L (ref 0–40)
Albumin: 4.7 g/dL (ref 3.6–4.8)
Alkaline Phosphatase: 45 IU/L (ref 39–117)
BILIRUBIN TOTAL: 0.6 mg/dL (ref 0.0–1.2)
Bilirubin, Direct: 0.16 mg/dL (ref 0.00–0.40)
TOTAL PROTEIN: 7.2 g/dL (ref 6.0–8.5)

## 2017-11-19 LAB — LIPID PANEL
CHOLESTEROL TOTAL: 216 mg/dL — AB (ref 100–199)
Chol/HDL Ratio: 4.9 ratio (ref 0.0–5.0)
HDL: 44 mg/dL (ref >39)
LDL Calculated: 123 mg/dL — ABNORMAL HIGH (ref 0–99)
TRIGLYCERIDES: 246 mg/dL — AB (ref 0–149)
VLDL Cholesterol Cal: 49 mg/dL — ABNORMAL HIGH (ref 5–40)

## 2017-11-19 LAB — BASIC METABOLIC PANEL
BUN / CREAT RATIO: 10 (ref 10–24)
BUN: 12 mg/dL (ref 8–27)
CALCIUM: 10.2 mg/dL (ref 8.6–10.2)
CHLORIDE: 102 mmol/L (ref 96–106)
CO2: 22 mmol/L (ref 20–29)
Creatinine, Ser: 1.15 mg/dL (ref 0.76–1.27)
GFR, EST AFRICAN AMERICAN: 77 mL/min/{1.73_m2} (ref >59)
GFR, EST NON AFRICAN AMERICAN: 67 mL/min/{1.73_m2} (ref >59)
Glucose: 88 mg/dL (ref 65–99)
POTASSIUM: 4.6 mmol/L (ref 3.5–5.2)
Sodium: 138 mmol/L (ref 134–144)

## 2017-11-19 MED ORDER — NITROGLYCERIN 0.4 MG SL SUBL: 0.4000 mg | SUBLINGUAL_TABLET | SUBLINGUAL | 2 refills | Status: DC | PRN

## 2017-11-19 NOTE — Patient Instructions (Signed)
Medication Instructions:  Your physician recommends that you continue on your current medications as directed. Please refer to the Current Medication list given to you today.   Labwork: TODAY: BMET, LIVER FUNCTION, LIPIDS  Testing/Procedures: None ordered  Follow-Up: Your physician wants you to follow-up in: [place]. [name].  Any Other Special Instructions Will Be Listed Below (If Applicable).     If you need a refill on your cardiac medications before your next appointment, please call your pharmacy.

## 2017-11-20 ENCOUNTER — Telehealth: Payer: Self-pay | Admitting: Cardiovascular Disease

## 2017-11-20 NOTE — Telephone Encounter (Signed)
Pt calling and stating he is returning call to nurse please call pt.

## 2017-11-20 NOTE — Telephone Encounter (Signed)
I spoke with pt's wife and reviewed lab results with her.  She reports pt will watch his diet more carefully and call us back if he would like to be referred to lipid clinic.

## 2018-01-10 IMAGING — CT CT CHEST W/O CM
2 of 4 series · 15 of 36 positions shown, 18 images · non-contrast
Comparison: CT abdomen 07/13/2015.

CLINICAL DATA: Chronic burning sensation in the chest. Shortness of
breath with exertion. Former smoker. Right middle lobe nodule seen
on prior abdominal CT scan.

EXAM:
CT CHEST WITHOUT CONTRAST
TECHNIQUE: Multidetector CT imaging of the chest was performed following the
standard protocol without IV contrast.

[Series 2: thorax · axial · 0.76mm/px · z∈[+1064,+1332]mm · 12 of 160 slices shown, 15 images]
[im 13/160  mediastinal]
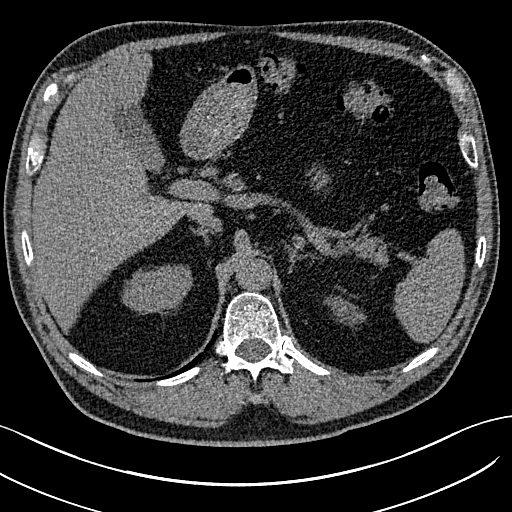
[im 13/160  lung]
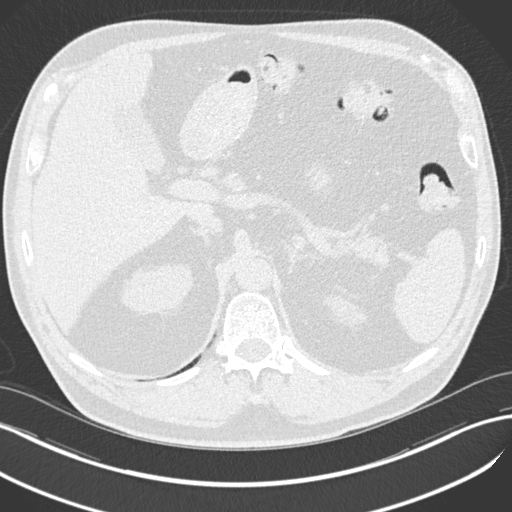
[im 25/160  lung]
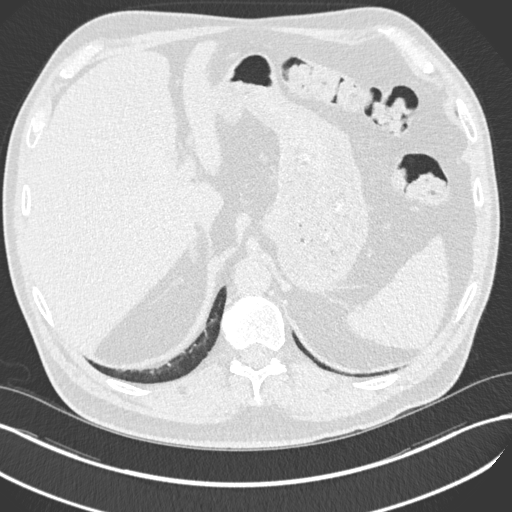
[im 37/160  lung]
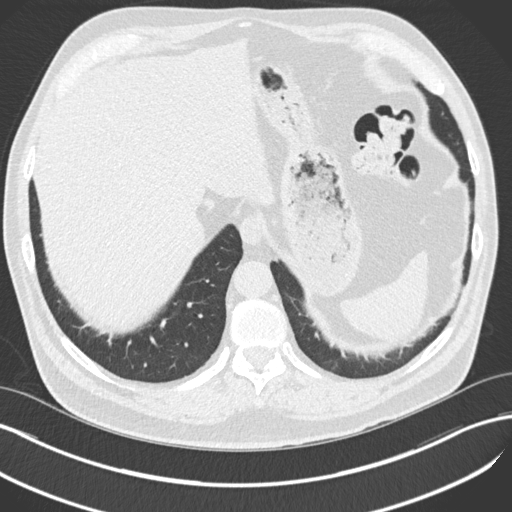
[im 49/160  lung]
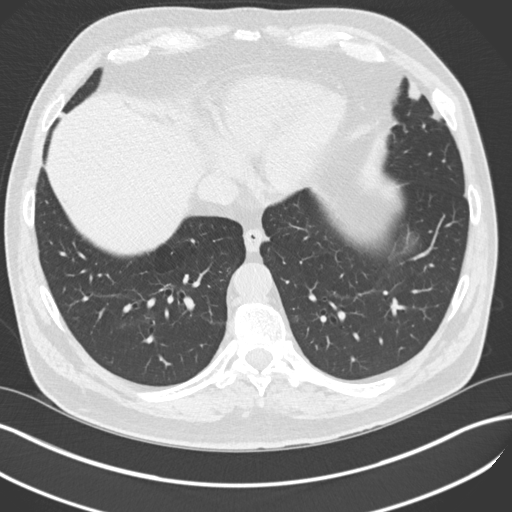
[im 62/160  mediastinal]
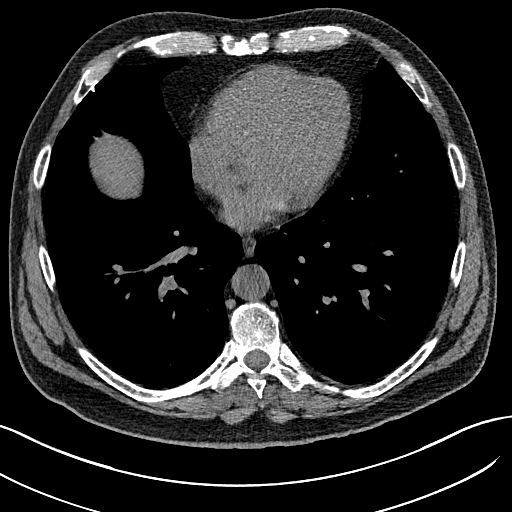
[im 62/160  lung]
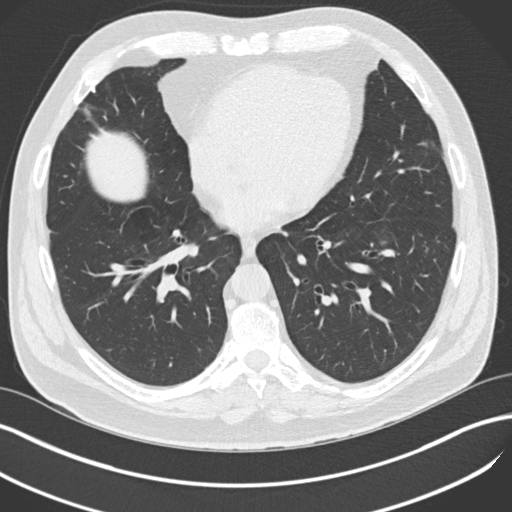
[im 74/160  lung]
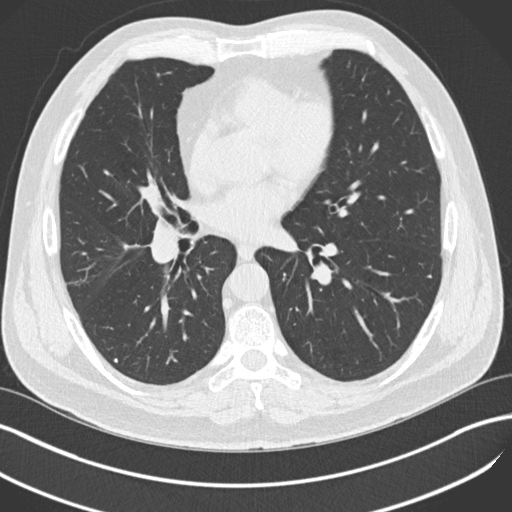
[im 86/160  lung]
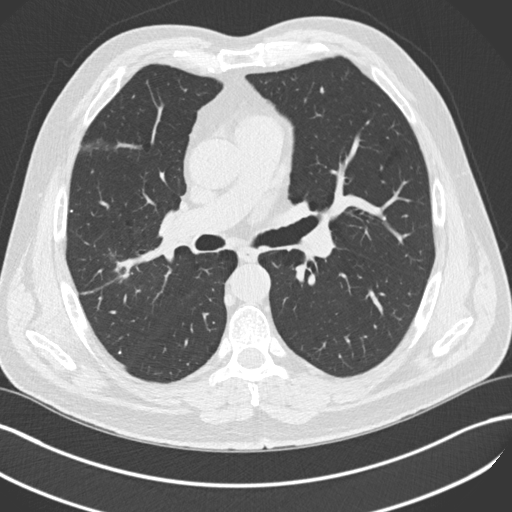
[im 98/160  lung]
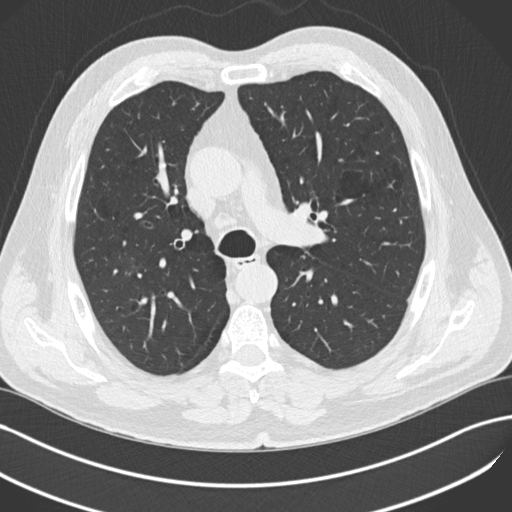
[im 111/160  mediastinal]
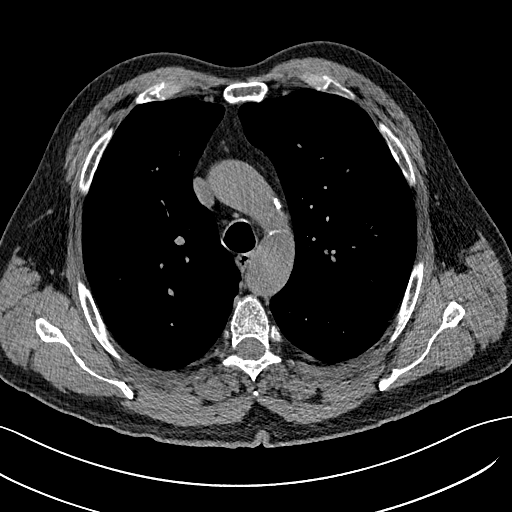
[im 111/160  lung]
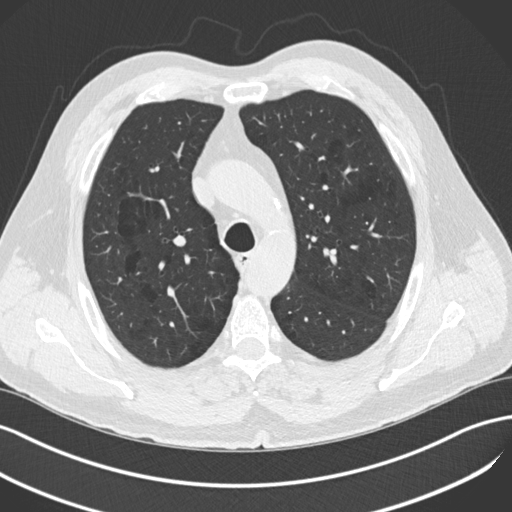
[im 123/160  lung]
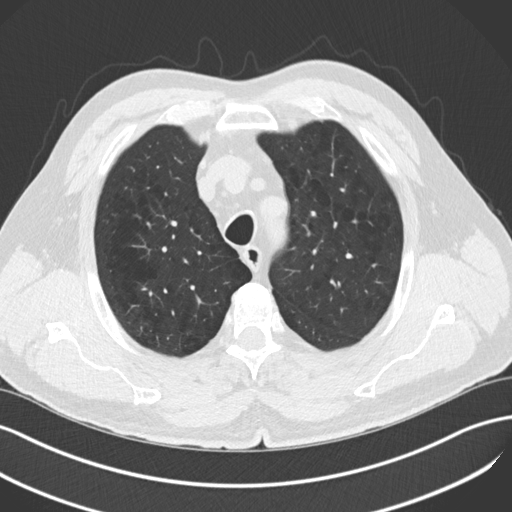
[im 135/160  lung]
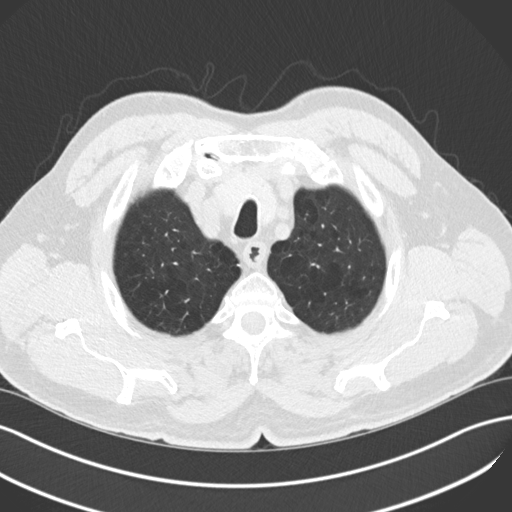
[im 147/160  lung]
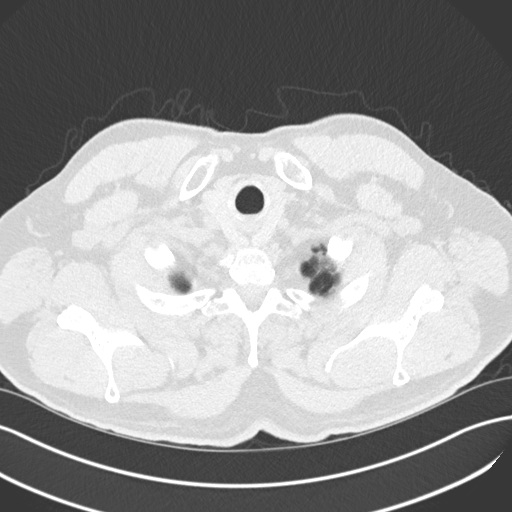

[Series 5: coronal · coronal · 0.80mm/px · 3 of 156 slices shown]
[im 32/156  lung]
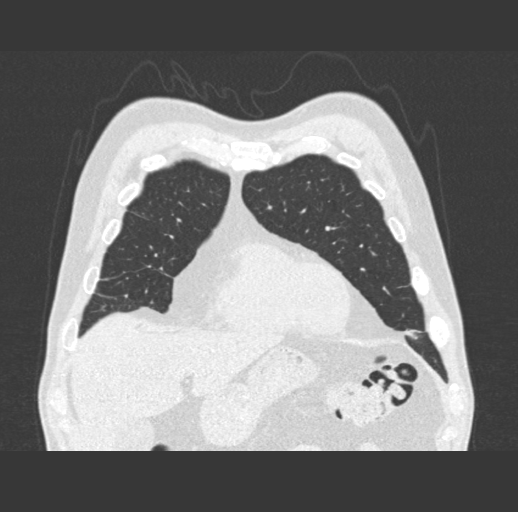
[im 63/156  lung]
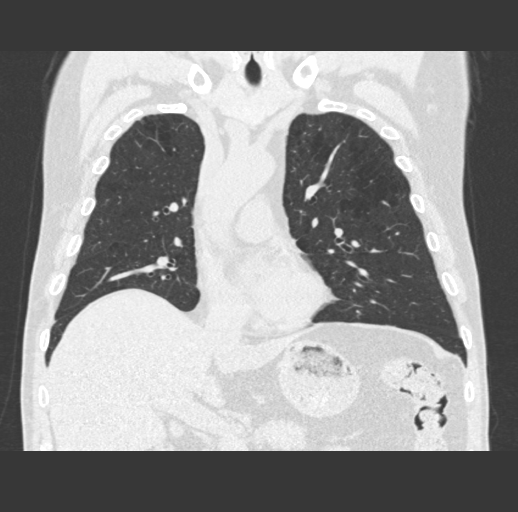
[im 94/156  lung]
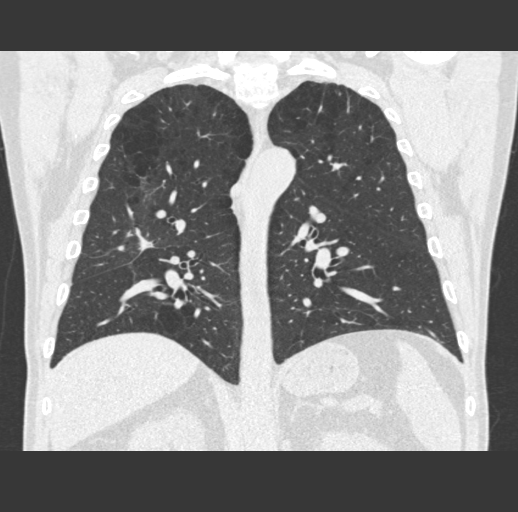

[15 of 36 positions shown; findings below may reference images not displayed]

FINDINGS: Mediastinum/Nodes: Heart is normal size. Aorta is normal caliber.
Scattered aortic arch calcifications. No mediastinal, hilar, or
axillary adenopathy.

Lungs/Pleura: Small calcified granuloma posteriorly in the right
upper lobe on image 65. Calcified granuloma peripherally in the
right middle lobe on image 75 and posteriorly in the right upper
lobe on the same image. Calcified nodule in the right lower lobe on
image 87. Noncalcified 5 mm nodule in the right middle lobe on image
85, stable since prior abdominal CT. Ill-defined airspace opacity is
noted in the right upper lobe centrally on image 82 -87. This is new
since prior abdominal CT. Linear densities in the right middle lobe
and lingula anteriorly compatible with scarring. Moderate emphysema.

Upper abdomen: Imaging into the upper abdomen shows no acute
findings.

Musculoskeletal: Chest wall soft tissues are unremarkable. No acute
bony abnormality or focal bone lesion.
IMPRESSION: 5 mm nodule in the right middle lobe is stable since prior abdominal
CT and nonspecific. There are several smaller calcified nodules
compatible with old granulomatous disease.

Ill-defined airspace opacity centrally in the right lung which
appears to be in the inferior right upper lobe, new since prior CT.
This is nonspecific and could reflect an area of early infiltrate.
Recommend follow-up in 3-6 months to assess for resolution.

## 2018-01-25 ENCOUNTER — Encounter (HOSPITAL_COMMUNITY): Payer: Self-pay | Admitting: Emergency Medicine

## 2018-01-25 ENCOUNTER — Observation Stay (HOSPITAL_COMMUNITY)
Admission: EM | Admit: 2018-01-25 | Discharge: 2018-01-27 | Disposition: A | Discharge status: Home / Self Care | Payer: Medicare Other | Attending: Internal Medicine | Admitting: Internal Medicine

## 2018-01-25 ENCOUNTER — Emergency Department (HOSPITAL_COMMUNITY): Payer: Medicare Other

## 2018-01-25 DIAGNOSIS — Z885 Allergy status to narcotic agent status: Secondary | ICD-10-CM | POA: Insufficient documentation

## 2018-01-25 DIAGNOSIS — I25110 Atherosclerotic heart disease of native coronary artery with unstable angina pectoris: Principal | ICD-10-CM | POA: Insufficient documentation

## 2018-01-25 DIAGNOSIS — N2889 Other specified disorders of kidney and ureter: Secondary | ICD-10-CM

## 2018-01-25 DIAGNOSIS — I10 Essential (primary) hypertension: Secondary | ICD-10-CM | POA: Insufficient documentation

## 2018-01-25 DIAGNOSIS — G459 Transient cerebral ischemic attack, unspecified: Secondary | ICD-10-CM | POA: Diagnosis present

## 2018-01-25 DIAGNOSIS — Z8673 Personal history of transient ischemic attack (TIA), and cerebral infarction without residual deficits: Secondary | ICD-10-CM | POA: Diagnosis not present

## 2018-01-25 DIAGNOSIS — D72829 Elevated white blood cell count, unspecified: Secondary | ICD-10-CM | POA: Diagnosis present

## 2018-01-25 DIAGNOSIS — I2510 Atherosclerotic heart disease of native coronary artery without angina pectoris: Secondary | ICD-10-CM | POA: Diagnosis not present

## 2018-01-25 DIAGNOSIS — E785 Hyperlipidemia, unspecified: Secondary | ICD-10-CM | POA: Diagnosis not present

## 2018-01-25 DIAGNOSIS — I252 Old myocardial infarction: Secondary | ICD-10-CM | POA: Diagnosis not present

## 2018-01-25 DIAGNOSIS — R0789 Other chest pain: Secondary | ICD-10-CM | POA: Diagnosis present

## 2018-01-25 DIAGNOSIS — E7800 Pure hypercholesterolemia, unspecified: Secondary | ICD-10-CM | POA: Diagnosis present

## 2018-01-25 DIAGNOSIS — Z7902 Long term (current) use of antithrombotics/antiplatelets: Secondary | ICD-10-CM | POA: Insufficient documentation

## 2018-01-25 DIAGNOSIS — Z87891 Personal history of nicotine dependence: Secondary | ICD-10-CM | POA: Insufficient documentation

## 2018-01-25 DIAGNOSIS — R079 Chest pain, unspecified: Secondary | ICD-10-CM | POA: Diagnosis present

## 2018-01-25 DIAGNOSIS — Z79899 Other long term (current) drug therapy: Secondary | ICD-10-CM | POA: Diagnosis not present

## 2018-01-25 DIAGNOSIS — I25118 Atherosclerotic heart disease of native coronary artery with other forms of angina pectoris: Secondary | ICD-10-CM | POA: Diagnosis present

## 2018-01-25 DIAGNOSIS — J9811 Atelectasis: Secondary | ICD-10-CM | POA: Diagnosis present

## 2018-01-25 DIAGNOSIS — I200 Unstable angina: Secondary | ICD-10-CM | POA: Diagnosis present

## 2018-01-25 HISTORY — DX: Atherosclerotic heart disease of native coronary artery without angina pectoris: I25.10

## 2018-01-25 LAB — BASIC METABOLIC PANEL
Anion gap: 8 (ref 5–15)
BUN: 13 mg/dL (ref 8–23)
CHLORIDE: 105 mmol/L (ref 98–111)
CO2: 26 mmol/L (ref 22–32)
Calcium: 9.7 mg/dL (ref 8.9–10.3)
Creatinine, Ser: 1.22 mg/dL (ref 0.61–1.24)
GFR calc Af Amer: >60 mL/min (ref >60)
GFR calc non Af Amer: >60 mL/min (ref >60)
Glucose, Bld: 94 mg/dL (ref 70–99)
POTASSIUM: 4.2 mmol/L (ref 3.5–5.1)
SODIUM: 139 mmol/L (ref 135–145)

## 2018-01-25 LAB — I-STAT TROPONIN, ED: TROPONIN I, POC: 0.01 ng/mL (ref 0.00–0.08)

## 2018-01-25 LAB — CBC
HEMATOCRIT: 38.9 % — AB (ref 39.0–52.0)
HEMOGLOBIN: 12.9 g/dL — AB (ref 13.0–17.0)
MCH: 33.1 pg (ref 26.0–34.0)
MCHC: 33.2 g/dL (ref 30.0–36.0)
MCV: 99.7 fL (ref 78.0–100.0)
Platelets: 200 10*3/uL (ref 150–400)
RBC: 3.9 MIL/uL — AB (ref 4.22–5.81)
RDW: 12.4 % (ref 11.5–15.5)
WBC: 14.5 10*3/uL — AB (ref 4.0–10.5)

## 2018-01-25 MED ORDER — RANOLAZINE ER 500 MG PO TB12
500.0000 mg | ORAL_TABLET | Freq: Two times a day (BID) | ORAL | Status: DC
  Administered 2018-01-26 (×3): 500 mg via ORAL
  Filled 2018-01-25 (×4): qty 1

## 2018-01-25 MED ORDER — MAGNESIUM OXIDE 400 (241.3 MG) MG PO TABS
200.0000 mg | ORAL_TABLET | Freq: Two times a day (BID) | ORAL | Status: DC
  Administered 2018-01-26 – 2018-01-27 (×4): 200 mg via ORAL
  Filled 2018-01-25 (×4): qty 1

## 2018-01-25 MED ORDER — ACETAMINOPHEN 325 MG PO TABS
650.0000 mg | ORAL_TABLET | ORAL | Status: DC | PRN
  Filled 2018-01-25: qty 2

## 2018-01-25 MED ORDER — HYDROMORPHONE HCL 1 MG/ML IJ SOLN
1.0000 mg | INTRAMUSCULAR | Status: DC | PRN
  Administered 2018-01-26 (×2): 1 mg via INTRAVENOUS
  Filled 2018-01-25 (×3): qty 1

## 2018-01-25 MED ORDER — CYANOCOBALAMIN 500 MCG PO TABS
500.0000 ug | ORAL_TABLET | Freq: Every day | ORAL | Status: DC
  Administered 2018-01-26 – 2018-01-27 (×2): 500 ug via ORAL
  Filled 2018-01-25 (×2): qty 1

## 2018-01-25 MED ORDER — CYCLOSPORINE 0.05 % OP EMUL
1.0000 [drp] | Freq: Two times a day (BID) | OPHTHALMIC | Status: DC
  Administered 2018-01-26: 1 [drp] via OPHTHALMIC
  Filled 2018-01-25 (×4): qty 1

## 2018-01-25 MED ORDER — OXYCODONE-ACETAMINOPHEN 5-325 MG PO TABS
1.0000 | ORAL_TABLET | ORAL | Status: DC | PRN
  Administered 2018-01-26 – 2018-01-27 (×3): 1 via ORAL
  Filled 2018-01-25 (×4): qty 1

## 2018-01-25 MED ORDER — HYDRALAZINE HCL 20 MG/ML IJ SOLN: 5.0000 mg | INTRAMUSCULAR | Status: DC | PRN

## 2018-01-25 MED ORDER — NITROGLYCERIN 2 % TD OINT: 0.5000 [in_us] | TOPICAL_OINTMENT | Freq: Once | TRANSDERMAL | Status: DC

## 2018-01-25 MED ORDER — CLOPIDOGREL BISULFATE 75 MG PO TABS
75.0000 mg | ORAL_TABLET | Freq: Every day | ORAL | Status: DC
  Administered 2018-01-26 – 2018-01-27 (×2): 75 mg via ORAL
  Filled 2018-01-25 (×2): qty 1

## 2018-01-25 MED ORDER — NEBIVOLOL HCL 5 MG PO TABS
2.5000 mg | ORAL_TABLET | Freq: Every day | ORAL | Status: DC
  Administered 2018-01-26: 2.5 mg via ORAL
  Filled 2018-01-25 (×2): qty 1

## 2018-01-25 MED ORDER — ASPIRIN EC 81 MG PO TBEC
81.0000 mg | DELAYED_RELEASE_TABLET | Freq: Every day | ORAL | Status: DC
  Filled 2018-01-25 (×2): qty 1

## 2018-01-25 MED ORDER — NITROGLYCERIN 0.4 MG SL SUBL: 0.4000 mg | SUBLINGUAL_TABLET | SUBLINGUAL | Status: DC | PRN

## 2018-01-25 MED ORDER — ZOLPIDEM TARTRATE 5 MG PO TABS
5.0000 mg | ORAL_TABLET | Freq: Every evening | ORAL | Status: DC | PRN
  Filled 2018-01-25: qty 1

## 2018-01-25 MED ORDER — ALPRAZOLAM 0.25 MG PO TABS
0.2500 mg | ORAL_TABLET | Freq: Two times a day (BID) | ORAL | Status: DC | PRN
  Administered 2018-01-26 – 2018-01-27 (×2): 0.25 mg via ORAL
  Filled 2018-01-25 (×3): qty 1

## 2018-01-25 MED ORDER — SODIUM CHLORIDE 0.9 % IV SOLN
INTRAVENOUS | Status: DC
  Administered 2018-01-26 (×2): via INTRAVENOUS

## 2018-01-25 MED ORDER — ONDANSETRON HCL 4 MG/2ML IJ SOLN
4.0000 mg | Freq: Four times a day (QID) | INTRAMUSCULAR | Status: DC | PRN
  Administered 2018-01-26 – 2018-01-27 (×2): 4 mg via INTRAVENOUS
  Filled 2018-01-25 (×2): qty 2

## 2018-01-25 MED ORDER — ENOXAPARIN SODIUM 40 MG/0.4ML ~~LOC~~ SOLN
40.0000 mg | Freq: Every day | SUBCUTANEOUS | Status: DC
  Administered 2018-01-26 (×2): 40 mg via SUBCUTANEOUS
  Filled 2018-01-25 (×2): qty 0.4

## 2018-01-25 NOTE — ED Triage Notes (Signed)
  Patient BIB [facility] for chest pain that started around 1730.  Mid sternal and hurt worse with deep breath.  Patient took 2 nitro tabs prior to EMS arrival with no relief. Patient also took 324 ASA.  EMS gave patient 1 nitro with no relief.  Patient states pain is in mid chest, back and neck.  A&O x 4.  No N/V.  Hx of MI in 2017.

## 2018-01-25 NOTE — ED Provider Notes (Signed)
[facility] EMERGENCY DEPARTMENT Provider Note   CSN: [id number] Arrival date & time: 01/25/18  1928     History   Chief Complaint Chief Complaint  Patient presents with  . Chest Pain    HPI Johnny [name] is a 67 y.o. male.  HPI  The patient is a 67 year old male, he has a history of coronary disease status post myocardial infarction but no stenting due to the involves arteries being very small.  He had a 50% occlusion in a larger artery but this was not subjected any intervention in 2017 when he had his heart attack.  He presents today with a recurrence of chest pain, this is midsternal, seems to radiate to the neck, seems to get worse with deep breathing, it came on a short time after trying to help his 200 pound son who is in a wheelchair to get into the house.  He does not recall a correlation with the actual action but it did occur sometime afterwards.  He does not feel short of breath, is not having fevers, has no swelling of the legs.  There is dullness or heaviness on the chest seems to be persistent but worse with deep breathing.  Took nitroglycerin and 325 mg of aspirin prior to arrival, states that might of helped a little bit.  Per review of the medical record he has a high marginal and first diagonal obstructions, these are small, it was noted in prior cardiology notes that he would be a candidate for some angioplasty should he continue to have ongoing chest pain.  He was last seen in the doctor's office of the cardiologist on July 2019.  Past Medical History:  Diagnosis Date  . Adenomatous polyps   . Bronchitis   . Chronic back pain   . GERD (gastroesophageal reflux disease)   . Hyperlipidemia   . Hypertension   . Internal hemorrhoids   . Low testosterone   . Myocardial infarction ([place])   . Osteopenia   . Shortness of breath dyspnea   . Spondylosis   . TIA (transient ischemic attack) 2008  . Ureterolithiasis   . Ventral hernia   . Vitamin D  deficiency     Patient Active Problem List   Diagnosis Date Noted  . Exertional dyspnea 09/23/2015  . CAP (community acquired pneumonia) 09/23/2015  . Lung nodule 09/23/2015  . Cough 09/23/2015  . Pneumonitis   . Coronary artery disease involving native coronary artery of native heart with unstable angina pectoris ([place])   . NSTEMI (non-ST elevated myocardial infarction) ([place])   . Chest pain 09/10/2015  . 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 Osteopenia 01/20/2012  . Hx of adenomatous colonic polyps 01/20/2012  . GE reflux 01/20/2012  . Vitamin D deficiency 01/20/2012  . Low serum testosterone level 01/20/2012  . Erectile dysfunction 01/20/2012  . Impaired glucose tolerance 01/20/2012  . Chronic back pain 01/20/2012  . Depression 02/01/2011  . ATTENTION OR CONCENTRATION DEFICIT 01/11/2010  . HYPERCHOLESTEROLEMIA 11/30/2008  . Essential hypertension 11/30/2008  . DEGENERATIVE JOINT DISEASE 11/30/2008    Past Surgical History:  Procedure Laterality Date  . BACK SURGERY     2 surgeries  . CARDIAC CATHETERIZATION N/A 09/12/2015   Procedure: Left Heart Cath and Coronary Angiography;  Surgeon: Lorretta Harp, MD;  Location: [facility];  Service: Cardiovascular;  Laterality: N/A;        Home Medications    Prior to Admission medications   Medication Sig Start Date End  Date Taking? Authorizing Provider  BYSTOLIC 2.5 MG tablet TAKE 1 TABLET BY MOUTH EVERY DAY MUST KEEP APPOINTMENT FOR FURTHER REFILL Patient taking differently: Take 2.5 mg by mouth daily.  05/01/17  Yes [name], MD  Calcium-Magnesium-Vitamin D (CALCIUM MAGNESIUM PO) Take 1 tablet by mouth daily.    Yes [provider]  clopidogrel (PLAVIX) 75 MG tablet TAKE 1 TABLET BY MOUTH EVERY DAY Patient taking differently: Take 75 mg by mouth daily.  06/11/17  Yes [name], MD  MAGNESIUM MALATE PO Take 200 mg by mouth 2 (two) times daily.    Yes [provider]  nitroGLYCERIN (NITROSTAT)  0.4 MG SL tablet Place 1 tablet (0.4 mg total) under the tongue every 5 (five) minutes as needed for chest pain. No more than 3 tablets within 15 minutes. 11/19/17  Yes Isaiah Serge, NP  oxyCODONE-acetaminophen (PERCOCET) 10-325 MG tablet Take 10-325 tablets by mouth every 4 (four) hours as needed (severe pain). Four to six times daily 04/14/16  Yes [provider]  RANEXA 500 MG 12 hr tablet TAKE 1 TABLET BY MOUTH TWICE A DAY Patient taking differently: Take 500 mg by mouth 2 (two) times daily.  05/13/17  Yes [name], MD  vitamin B-12 (CYANOCOBALAMIN) 500 MCG tablet Take 500 mcg by mouth daily.    Yes [provider]  RESTASIS 0.05 % ophthalmic emulsion Place 1 drop into both eyes 2 (two) times daily. 12/19/17   [provider]    Family History Family History  Problem Relation Age of Onset  . Heart disease Mother   . Stroke Mother   . Heart attack Mother   . Emphysema Father     Social History Social History   Tobacco Use  . Smoking status: Former Smoker    Years: 10.00    Types: Cigarettes    Last attempt to quit: 01/04/1978    Years since quitting: 40.0  . Smokeless tobacco: Never Used  Substance Use Topics  . Alcohol use: No    Alcohol/week: 0.0 standard drinks    Comment: rarely  . Drug use: No     Allergies   Atorvastatin; Crestor [rosuvastatin calcium]; Metadate cd [methylphenidate hcl er (cd)]; Other; Pravastatin; Rosuvastatin calcium; Hydrocodone; Levofloxacin; Levofloxacin in d5w; and Morphine and related   Review of Systems Review of Systems  All other systems reviewed and are negative.    Physical Exam Updated Vital Signs BP 124/78   Pulse 64   Temp 98.2 F (36.8 C) (Oral)   Resp 13   Ht 1.829 m (6')   Wt 97.1 kg   SpO2 96%   BMI 29.02 kg/m   Physical Exam  Constitutional: He appears well-developed and well-nourished. No distress.  HENT:  Head: Normocephalic and atraumatic.  Mouth/Throat: Oropharynx is clear  and moist. No oropharyngeal exudate.  Eyes: Pupils are equal, round, and reactive to light. Conjunctivae and EOM are normal. Right eye exhibits no discharge. Left eye exhibits no discharge. No scleral icterus.  Neck: Normal range of motion. Neck supple. No JVD present. No thyromegaly present.  Cardiovascular: Normal rate, regular rhythm, normal heart sounds and intact distal pulses. Exam reveals no gallop and no friction rub.  No murmur heard. Pulmonary/Chest: Effort normal and breath sounds normal. No respiratory distress. He has no wheezes. He has no rales.  Abdominal: Soft. Bowel sounds are normal. He exhibits no distension and no mass. There is no tenderness.  Musculoskeletal: Normal range of motion. He exhibits no  edema or tenderness.  Lymphadenopathy:    He has no cervical adenopathy.  Neurological: He is alert. Coordination normal.  Skin: Skin is warm and dry. No rash noted. No erythema.  Psychiatric: He has a normal mood and affect. His behavior is normal.  Nursing note and vitals reviewed.    ED Treatments / Results  Labs (all labs ordered are listed, but only abnormal results are displayed) Labs Reviewed  CBC - Abnormal; Notable for the following components:      Result Value   WBC 14.5 (*)    RBC 3.90 (*)    Hemoglobin 12.9 (*)    HCT 38.9 (*)    All other components within normal limits  BASIC METABOLIC PANEL  I-STAT TROPONIN, ED    EKG EKG Interpretation  Date/Time:  Saturday January 25 2018 19:37:33 EDT Ventricular Rate:  65 PR Interval:    QRS Duration: 97 QT Interval:  401 QTC Calculation: 417 R Axis:   93 Text Interpretation:  Sinus rhythm Right axis deviation since last tracing no significant change Confirmed by Noemi Chapel [phone]) on 01/25/2018 7:40:19 PM   Radiology Dg Chest 2 View  Result Date: 01/25/2018 CLINICAL DATA:  Chest pain EXAM: CHEST - 2 VIEW COMPARISON:  12/30/2015 chest radiograph. FINDINGS: Stable cardiomediastinal silhouette with  normal heart size. No pneumothorax. No pleural effusion. No pulmonary edema. Mild bibasilar scarring versus atelectasis. IMPRESSION: Mild bibasilar scarring versus atelectasis. Otherwise no active disease. Electronically Signed   By: Ilona Sorrel M.D.   On: 01/25/2018 20:24    Procedures .Critical Care Performed by: Noemi Chapel, MD Authorized by: Noemi Chapel, MD   Critical care provider statement:    Critical care time (minutes):  35   Critical care time was exclusive of:  Separately billable procedures and treating other patients and teaching time   Critical care was necessary to treat or prevent imminent or life-threatening deterioration of the following conditions: Unstable Angina.   Critical care was time spent personally by me on the following activities:  Blood draw for specimens, development of treatment plan with patient or surrogate, discussions with consultants, evaluation of patient's response to treatment, examination of patient, obtaining history from patient or surrogate, ordering and performing treatments and interventions, ordering and review of laboratory studies, ordering and review of radiographic studies, pulse oximetry, re-evaluation of patient's condition and review of old charts   (including critical care time)  Medications Ordered in ED Medications  nitroGLYCERIN (NITROGLYN) 2 % ointment 0.5 inch (has no administration in time range)     Initial Impression / Assessment and Plan / ED Course  I have reviewed the triage vital signs and the nursing notes.  Pertinent labs & imaging results that were available during my care of the patient were reviewed by me and considered in my medical decision making (see chart for details).    The patient's EKG is rather unremarkable, there is a slight right axis deviation but no signs of ST elevation depression or T wave abnormalities.  We will pursue troponin, chest x-ray, repeat evaluation.  He may need a second troponin and I  will discuss with cardiology.  Pt of Dr. [name].  The patient has ongoing chest discomfort going to his neck, he does have an elevated white blood cell count of 14,500 but a negative troponin.  Based on his prior cardiology evaluation there was a possibility that he may be a beneficiary of some type of angioplasty to these segments of coronary arteries that have obstructive  disease however he has not had significant angina to prompt this consideration up to this point.  Now that he is having ongoing pain I think it is reasonable to admit the patient overnight.  The patient also agrees with this.  I did discuss the case with Dr. Radford Pax who is in agreement that the patient can be admitted to the hospital service.  The patient will be placed on nitroglycerin topically.  Consider this to be unstable angina.  D/w Dr. Blaine Hamper - will admit  Final Clinical Impressions(s) / ED Diagnoses   Final diagnoses:  Unstable angina [facility])     Noemi Chapel, MD 01/25/18 2330

## 2018-01-25 NOTE — Progress Notes (Signed)
Pt admitted from ED with c/o of chest pain, still c/o of pain with a rating of 4, pt settled in bed with call light and family at bedside, tele monitor put and verified on pt, was however reassured and will continue to monitor, v/s stable. Johnny [name], Johnny [name]

## 2018-01-25 NOTE — ED Notes (Signed)
Nurse will collect labs. 

## 2018-01-25 NOTE — H&P (Signed)
History and Physical    Johnny [name] BDZ:[id number] DOB: 05/30/1950 DOA: 01/25/2018  Referring MD/NP/PA:   PCP: [name], MD   Patient coming from:  The patient is coming from home.  At baseline, pt is independent for most of ADL.   Chief Complaint: chest pain  HPI: Johnny [name] is a 67 y.o. male with medical history significant of hypertension, hyperlipidemia, TIA, GERD, CAD, myocardial infarction, who presents with chest pain.  Pt states that his CP started after trying to help his 200 pound son who is in a wheelchair to get into the house at about 5:30 PM. His chest pain is located in the substernal area, initially 10 out of 10 severity, currently 3 out of 10 severity, pressure-like, radiating to the neck bilaterally.  The pain is pleuritic, aggravated by deep breath.  He does not have long distant traveling.  No shortness of breath.  Patient denies cough, fever or chills.  No nausea, vomiting, diarrhea or abdominal pain.  No symptoms of UTI. Took nitroglycerin and 325 mg of aspirin prior to arrival, states that might of helped a little bit.  Cardiac cath on 09/12/15:  Ramus lesion, 95% stenosed.  1st Diag lesion, 90% stenosed.  Mid LAD to Dist LAD lesion, 50% stenosed.  The left ventricular systolic function is normal.  ED Course: pt was found to have negative troponin, WBC 14.5, creatinine 1.22, GFR 60, temperature normal, no tachycardia, no tachypnea, oxygen satting 96% on room air, chest x-ray showed bilateral basilar scarring versus atelectasis.  Patient is placed on telemetry bed for observation.  Patient has been followed up by Dr. [name] of cardiology  Review of Systems:   General: no fevers, chills, no body weight gain, fatigue HEENT: no blurry vision, hearing changes or sore throat Respiratory: no dyspnea, coughing, wheezing CV: has chest pain, no palpitations GI: no nausea, vomiting, abdominal pain, diarrhea, constipation GU: no dysuria, burning on  urination, increased urinary frequency, hematuria  Ext: no leg edema Neuro: no unilateral weakness, numbness, or tingling, no vision change or hearing loss Skin: no rash, no skin tear. MSK: No muscle spasm, no deformity, no limitation of range of movement in spin Heme: No easy bruising.  Travel history: No recent long distant travel.  Allergy:  Allergies  Allergen Reactions  . Atorvastatin Other (See Comments)    Joint pain Joint pain  . Crestor [Rosuvastatin Calcium] Other (See Comments)    Joint pain  . Metadate Cd [Methylphenidate Hcl Er (Cd)] Other (See Comments)    Burning, GI upset, bloating  . Other Other (See Comments)    Burning, GI upset, bloating  . Pravastatin Other (See Comments)  . Rosuvastatin Calcium Other (See Comments)    Joint pain  . Hydrocodone Nausea Only    Can take with food Can take with food  . Levofloxacin Nausea And Vomiting  . Levofloxacin In D5w Nausea And Vomiting  . Morphine And Related Nausea And Vomiting    Past Medical History:  Diagnosis Date  . Adenomatous polyps   . Bronchitis   . Chronic back pain   . GERD (gastroesophageal reflux disease)   . Hyperlipidemia   . Hypertension   . Internal hemorrhoids   . Low testosterone   . Myocardial infarction ([place])   . Osteopenia   . Shortness of breath dyspnea   . Spondylosis   . TIA (transient ischemic attack) 2008  . Ureterolithiasis   . Ventral hernia   . Vitamin D deficiency  Past Surgical History:  Procedure Laterality Date  . BACK SURGERY     2 surgeries  . CARDIAC CATHETERIZATION N/A 09/12/2015   Procedure: Left Heart Cath and Coronary Angiography;  Surgeon: Lorretta Harp, MD;  Location: [facility];  Service: Cardiovascular;  Laterality: N/A;    Social History:  reports that he quit smoking about 40 years ago. His smoking use included cigarettes. He quit after 10.00 years of use. He has never used smokeless tobacco. He reports that he does not drink alcohol or  use drugs.  Family History:  Family History  Problem Relation Age of Onset  . Heart disease Mother   . Stroke Mother   . Heart attack Mother   . Emphysema Father      Prior to Admission medications   Medication Sig Start Date End Date Taking? Authorizing Provider  BYSTOLIC 2.5 MG tablet TAKE 1 TABLET BY MOUTH EVERY DAY MUST KEEP APPOINTMENT FOR FURTHER REFILL Patient taking differently: Take 2.5 mg by mouth daily.  05/01/17  Yes [name], MD  Calcium-Magnesium-Vitamin D (CALCIUM MAGNESIUM PO) Take 1 tablet by mouth daily.    Yes [provider]  clopidogrel (PLAVIX) 75 MG tablet TAKE 1 TABLET BY MOUTH EVERY DAY Patient taking differently: Take 75 mg by mouth daily.  06/11/17  Yes [name], MD  MAGNESIUM MALATE PO Take 200 mg by mouth 2 (two) times daily.    Yes [provider]  nitroGLYCERIN (NITROSTAT) 0.4 MG SL tablet Place 1 tablet (0.4 mg total) under the tongue every 5 (five) minutes as needed for chest pain. No more than 3 tablets within 15 minutes. 11/19/17  Yes Isaiah Serge, NP  oxyCODONE-acetaminophen (PERCOCET) 10-325 MG tablet Take 10-325 tablets by mouth every 4 (four) hours as needed (severe pain). Four to six times daily 04/14/16  Yes [provider]  RANEXA 500 MG 12 hr tablet TAKE 1 TABLET BY MOUTH TWICE A DAY Patient taking differently: Take 500 mg by mouth 2 (two) times daily.  05/13/17  Yes [name], MD  vitamin B-12 (CYANOCOBALAMIN) 500 MCG tablet Take 500 mcg by mouth daily.    Yes [provider]  RESTASIS 0.05 % ophthalmic emulsion Place 1 drop into both eyes 2 (two) times daily. 12/19/17   [provider]    Physical Exam: Vitals:   01/25/18 2100 01/25/18 2130 01/25/18 2200 01/25/18 2230  BP: 127/80 124/78 107/71 110/74  Pulse: 70 64 71 64  Resp: 16 13 17 16   Temp:      TempSrc:      SpO2: 100% 96% 97% 96%  Weight:      Height:       General: Not in acute distress HEENT:       Eyes: PERRL,  EOMI, no scleral icterus.       ENT: No discharge from the ears and nose, no pharynx injection, no tonsillar enlargement.        Neck: No JVD, no bruit, no mass felt. Heme: No neck lymph node enlargement. Cardiac: S1/S2, RRR, No murmurs, No gallops or rubs. Respiratory: No rales, wheezing, rhonchi or rubs. Chest Wall: pt has minimal frontal chest wall tenderness GI: Soft, nondistended, nontender, no rebound pain, no organomegaly, BS present. GU: No hematuria Ext: No pitting leg edema bilaterally. 2+DP/PT pulse bilaterally. Musculoskeletal: No joint deformities, No joint redness or warmth, no limitation of ROM in spin. Skin: No rashes.  Neuro: Alert, oriented X3, cranial nerves II-XII grossly  intact, moves all extremities normally.  Psych: Patient is not psychotic, no suicidal or hemocidal ideation.  Labs on Admission: I have personally reviewed following labs and imaging studies  CBC: Recent Labs  Lab 01/25/18 1941  WBC 14.5*  HGB 12.9*  HCT 38.9*  MCV 99.7  PLT 016   Basic Metabolic Panel: Recent Labs  Lab 01/25/18 1941  NA 139  K 4.2  CL 105  CO2 26  GLUCOSE 94  BUN 13  CREATININE 1.22  CALCIUM 9.7   GFR: Estimated Creatinine Clearance: 72.9 mL/min (by C-G formula based on SCr of 1.22 mg/dL). Liver Function Tests: No results for input(s): AST, ALT, ALKPHOS, BILITOT, PROT, ALBUMIN in the last 168 hours. No results for input(s): LIPASE, AMYLASE in the last 168 hours. No results for input(s): AMMONIA in the last 168 hours. Coagulation Profile: No results for input(s): INR, PROTIME in the last 168 hours. Cardiac Enzymes: No results for input(s): CKTOTAL, CKMB, CKMBINDEX, TROPONINI in the last 168 hours. BNP (last 3 results) No results for input(s): PROBNP in the last 8760 hours. HbA1C: No results for input(s): HGBA1C in the last 72 hours. CBG: No results for input(s): GLUCAP in the last 168 hours. Lipid Profile: No results for input(s): CHOL, HDL, LDLCALC, TRIG,  CHOLHDL, LDLDIRECT in the last 72 hours. Thyroid Function Tests: No results for input(s): TSH, T4TOTAL, FREET4, T3FREE, THYROIDAB in the last 72 hours. Anemia Panel: No results for input(s): VITAMINB12, FOLATE, FERRITIN, TIBC, IRON, RETICCTPCT in the last 72 hours. Urine analysis:    Component Value Date/Time   COLORURINE YELLOW 12/30/2015 0546   APPEARANCEUR CLEAR 12/30/2015 0546   LABSPEC 1.019 12/30/2015 0546   PHURINE 5.5 12/30/2015 0546   GLUCOSEU NEGATIVE 12/30/2015 0546   HGBUR NEGATIVE 12/30/2015 0546   BILIRUBINUR NEGATIVE 12/30/2015 0546   BILIRUBINUR neg 06/24/2015 1508   KETONESUR NEGATIVE 12/30/2015 0546   PROTEINUR NEGATIVE 12/30/2015 0546   UROBILINOGEN 0.2 06/24/2015 1508   UROBILINOGEN 0.2 10/26/2014 0337   NITRITE NEGATIVE 12/30/2015 0546   LEUKOCYTESUR NEGATIVE 12/30/2015 0546   Sepsis Labs: @LABRCNTIP (procalcitonin:4,lacticidven:4) )No results found for this or any previous visit (from the past 240 hour(s)).   Radiological Exams on Admission: Dg Chest 2 View  Result Date: 01/25/2018 CLINICAL DATA:  Chest pain EXAM: CHEST - 2 VIEW COMPARISON:  12/30/2015 chest radiograph. FINDINGS: Stable cardiomediastinal silhouette with normal heart size. No pneumothorax. No pleural effusion. No pulmonary edema. Mild bibasilar scarring versus atelectasis. IMPRESSION: Mild bibasilar scarring versus atelectasis. Otherwise no active disease. Electronically Signed   By: Ilona Sorrel M.D.   On: 01/25/2018 20:24     EKG: Independently reviewed.  Sinus rhythm, QTC 417, low voltage, nonspecific T wave change.   Assessment/Plan Principal Problem:   Chest pain Active Problems:   HYPERCHOLESTEROLEMIA   Essential hypertension   TIA (transient ischemic attack)   Leukocytosis  Chest pain: Etiology is not clear.  Patient has leukocytosis, but no no fever, no respiratory symptoms.  Chest x-ray did not show infiltration.  Less likely to have pneumonia.  Chest pain is pleuritic, but  d-dimer negative, less likely to have PE.  She has known CAD, will need to rule out ACS.  - will place on Tele bed for obs - cycle CE q6 x3 and repeat EKG in the am  - prn Nitroglycerin, dilaudid, and aspirin, Ranexa, Bystolic - pt is allergic to statin - Risk factor stratification: will check FLP,UDS and A1C  - did not order 2d echo--> please reevaluate pt in  AM to decide if pt needs 2D echo. - inpt card consult was requested via Epic  HYPERCHOLESTEROLEMIA: LDL was 123 on 11/20/15. Pt is allergic to statin -f/u FLP. If elevated, may start Zetia  HTN:  -Continue home medications: Bystolic -IV hydralazine prn  TIA (transient ischemic attack): -on plavix  Leukocytosis: WBC 14.5. no signs of infection. Likely due to stress induced to demargination. -will follow up blood and UA -follow up by CBC     DVT ppx:  SQ Lovenox Code Status: Full code Family Communication:  Yes, patient's giralfriend   at bed side Disposition Plan:  Anticipate discharge back to previous home environment Consults called:  none Admission status: Obs / tele     Date of Service 01/25/2018    Ivor Costa [facility] Pager [phone]  If 7PM-7AM, please contact night-coverage [email or web address] Password [email or web address] 01/25/2018, 10:55 PM

## 2018-01-26 ENCOUNTER — Encounter (HOSPITAL_COMMUNITY): Payer: Self-pay | Admitting: Cardiology

## 2018-01-26 ENCOUNTER — Other Ambulatory Visit: Payer: Self-pay

## 2018-01-26 ENCOUNTER — Observation Stay (HOSPITAL_COMMUNITY): Payer: Medicare Other

## 2018-01-26 ENCOUNTER — Observation Stay (HOSPITAL_BASED_OUTPATIENT_CLINIC_OR_DEPARTMENT_OTHER): Payer: Medicare Other

## 2018-01-26 DIAGNOSIS — E785 Hyperlipidemia, unspecified: Secondary | ICD-10-CM | POA: Diagnosis not present

## 2018-01-26 DIAGNOSIS — I2510 Atherosclerotic heart disease of native coronary artery without angina pectoris: Secondary | ICD-10-CM | POA: Diagnosis not present

## 2018-01-26 DIAGNOSIS — I200 Unstable angina: Secondary | ICD-10-CM | POA: Diagnosis present

## 2018-01-26 DIAGNOSIS — I10 Essential (primary) hypertension: Secondary | ICD-10-CM | POA: Diagnosis not present

## 2018-01-26 DIAGNOSIS — N2889 Other specified disorders of kidney and ureter: Secondary | ICD-10-CM

## 2018-01-26 DIAGNOSIS — J9811 Atelectasis: Secondary | ICD-10-CM | POA: Diagnosis not present

## 2018-01-26 DIAGNOSIS — I25110 Atherosclerotic heart disease of native coronary artery with unstable angina pectoris: Secondary | ICD-10-CM | POA: Diagnosis not present

## 2018-01-26 DIAGNOSIS — R079 Chest pain, unspecified: Secondary | ICD-10-CM | POA: Diagnosis not present

## 2018-01-26 DIAGNOSIS — E7800 Pure hypercholesterolemia, unspecified: Secondary | ICD-10-CM | POA: Diagnosis not present

## 2018-01-26 LAB — CBC WITH DIFFERENTIAL/PLATELET
Abs Immature Granulocytes: 0 10*3/uL (ref 0.0–0.1)
BASOS PCT: 0 %
Basophils Absolute: 0 10*3/uL (ref 0.0–0.1)
EOS ABS: 0.1 10*3/uL (ref 0.0–0.7)
EOS PCT: 1 %
HCT: 40.2 % (ref 39.0–52.0)
Hemoglobin: 13.2 g/dL (ref 13.0–17.0)
Immature Granulocytes: 0 %
Lymphocytes Relative: 23 %
Lymphs Abs: 2.4 10*3/uL (ref 0.7–4.0)
MCH: 32.8 pg (ref 26.0–34.0)
MCHC: 32.8 g/dL (ref 30.0–36.0)
MCV: 100 fL (ref 78.0–100.0)
MONO ABS: 1.6 10*3/uL — AB (ref 0.1–1.0)
MONOS PCT: 15 %
NEUTROS PCT: 61 %
Neutro Abs: 6.3 10*3/uL (ref 1.7–7.7)
PLATELETS: 194 10*3/uL (ref 150–400)
RBC: 4.02 MIL/uL — ABNORMAL LOW (ref 4.22–5.81)
RDW: 12.3 % (ref 11.5–15.5)
WBC: 10.4 10*3/uL (ref 4.0–10.5)

## 2018-01-26 LAB — URINALYSIS, ROUTINE W REFLEX MICROSCOPIC
BILIRUBIN URINE: NEGATIVE
Glucose, UA: NEGATIVE mg/dL
Hgb urine dipstick: NEGATIVE
Ketones, ur: NEGATIVE mg/dL
Leukocytes, UA: NEGATIVE
NITRITE: NEGATIVE
PH: 7 (ref 5.0–8.0)
Protein, ur: NEGATIVE mg/dL
SPECIFIC GRAVITY, URINE: 1.013 (ref 1.005–1.030)

## 2018-01-26 LAB — TROPONIN I: Troponin I: <0.03 ng/mL (ref <0.03)

## 2018-01-26 LAB — RAPID URINE DRUG SCREEN, HOSP PERFORMED
AMPHETAMINES: NOT DETECTED
BARBITURATES: NOT DETECTED
BENZODIAZEPINES: NOT DETECTED
COCAINE: NOT DETECTED
Opiates: POSITIVE — AB
Tetrahydrocannabinol: NOT DETECTED

## 2018-01-26 LAB — HEMOGLOBIN A1C
Hgb A1c MFr Bld: 5.3 % (ref 4.8–5.6)
Mean Plasma Glucose: 105.41 mg/dL

## 2018-01-26 LAB — LIPID PANEL
CHOLESTEROL: 177 mg/dL (ref 0–200)
HDL: 37 mg/dL — ABNORMAL LOW (ref >40)
LDL CALC: 97 mg/dL (ref 0–99)
Total CHOL/HDL Ratio: 4.8 RATIO
Triglycerides: 217 mg/dL — ABNORMAL HIGH (ref <150)
VLDL: 43 mg/dL — AB (ref 0–40)

## 2018-01-26 LAB — ECHOCARDIOGRAM COMPLETE
Height: 72 in
Weight: 3488.56 oz

## 2018-01-26 LAB — HIV ANTIBODY (ROUTINE TESTING W REFLEX): HIV Screen 4th Generation wRfx: NONREACTIVE

## 2018-01-26 LAB — D-DIMER, QUANTITATIVE (NOT AT ARMC)

## 2018-01-26 MED ORDER — EZETIMIBE 10 MG PO TABS
10.0000 mg | ORAL_TABLET | Freq: Every day | ORAL | Status: DC
  Filled 2018-01-26 (×2): qty 1

## 2018-01-26 MED ORDER — SODIUM CHLORIDE 0.9 % WEIGHT BASED INFUSION: 3.0000 mL/kg/h | INTRAVENOUS | Status: DC

## 2018-01-26 MED ORDER — SODIUM CHLORIDE 0.9% FLUSH: 3.0000 mL | INTRAVENOUS | Status: DC | PRN

## 2018-01-26 MED ORDER — ASPIRIN 81 MG PO CHEW
81.0000 mg | CHEWABLE_TABLET | ORAL | Status: AC
  Administered 2018-01-27: 81 mg via ORAL
  Filled 2018-01-26: qty 1

## 2018-01-26 MED ORDER — SODIUM CHLORIDE 0.9 % WEIGHT BASED INFUSION
1.0000 mL/kg/h | INTRAVENOUS | Status: DC
  Administered 2018-01-27: 1 mL/kg/h via INTRAVENOUS

## 2018-01-26 MED ORDER — SODIUM CHLORIDE 0.9 % IV SOLN: 250.0000 mL | INTRAVENOUS | Status: DC | PRN

## 2018-01-26 MED ORDER — SODIUM CHLORIDE 0.9% FLUSH
3.0000 mL | Freq: Two times a day (BID) | INTRAVENOUS | Status: DC
  Administered 2018-01-26: 3 mL via INTRAVENOUS

## 2018-01-26 NOTE — Progress Notes (Signed)
  Echocardiogram 2D Echocardiogram has been performed.  Johnny [name] 01/26/2018, 12:49 PM

## 2018-01-26 NOTE — Consult Note (Signed)
Cardiology Consultation:   Patient ID: Johnny [name] MRN: [id number]; DOB: [birth year]  Admit date: 01/25/2018 Date of Consult: 01/26/2018  Primary Care Provider: [name], MD Primary Cardiologist: [name], MD  Primary Electrophysiologist:  None    Patient Profile:   Johnny [name] is a 67 y.o. male with a hx of HTN, HLD, possible prior TIA, and GERD and small vessel disease with episodic angina who is being seen today for the evaluation of chest pain at the request of Dr. Evangeline Gula.  History of Present Illness:   MR Read with above hx and last cath 2017 with Ramus lesion, 95% stenosed., 1st Diag lesion, 90% stenosed and mLAD to dLAD 50 % stenosis.  Per Dr. Gwenlyn Found at time of cat "branch vessel disease and a high marginal branch and a first diagonal branch. These are small vessels in diameter though they are fairly long in length. He has moderate segmental mid LAD disease as well. At this point, I would recommend optimizing his antianginal medications. She had continued chest pain he would be a candidate for angioplasty of his diagonal branch and marginal branch"  Pt has been treated medically.  He has not wanted to increase the ranexa.   Now admitted with chest pain.  He presented last pm with mid sternal pain and increase of pain with deep breath.  2 NTG prior to admit.  No relief.  Pain began with skeet shooting and then increased with helping 200 lb son maneuver to wheel chair.  He did take 325 mg ASA as well.  Initially heavy pressure in chest and then more with movement and being flat.   EKG I personally reviewed SR and no acute changes.  Today's EKG with mild ? ST elevation lat leads.   Tele I personally reviewed.   SR  Troponin <0.03 X 2, Na 139, K+ 4.2, Cr 1.22  Hgb 12.9, WBC 14.5, Plts 200.  Tchol 177, HDL 37, LDL 97 and TG 217  ddimer <0.27 Hgb A1C 5.3 Blood cultures pending.   CXR Mild bibasilar scarring versus atelectasis. Otherwise no  active disease  Currently just back from CT - pain increased with lying flat in scanner.  Is tender across his chest with palpation.  Some racing HR yesterday, none now.  Does go through to his back.  BP controlled     Past Medical History:  Diagnosis Date  . Adenomatous polyps   . Bronchitis   . CAD in native artery 2017  . Chronic back pain   . GERD (gastroesophageal reflux disease)   . Hyperlipidemia   . Hypertension   . Internal hemorrhoids   . Low testosterone   . Myocardial infarction ([place])   . Osteopenia   . Shortness of breath dyspnea   . Spondylosis   . TIA (transient ischemic attack) 2008  . Ureterolithiasis   . Ventral hernia   . Vitamin D deficiency     Past Surgical History:  Procedure Laterality Date  . BACK SURGERY     2 surgeries  . CARDIAC CATHETERIZATION N/A 09/12/2015   Procedure: Left Heart Cath and Coronary Angiography;  Surgeon: Lorretta Harp, MD;  Location: [facility];  Service: Cardiovascular;  Laterality: N/A;     Home Medications:  Prior to Admission medications   Medication Sig Start Date End Date Taking? Authorizing Provider  BYSTOLIC 2.5 MG tablet TAKE 1 TABLET BY MOUTH EVERY DAY MUST KEEP APPOINTMENT FOR FURTHER REFILL Patient taking differently: Take 2.5 mg  by mouth daily.  05/01/17  Yes [name], MD  Calcium-Magnesium-Vitamin D (CALCIUM MAGNESIUM PO) Take 1 tablet by mouth daily.    Yes [provider]  clopidogrel (PLAVIX) 75 MG tablet TAKE 1 TABLET BY MOUTH EVERY DAY Patient taking differently: Take 75 mg by mouth daily.  06/11/17  Yes [name], MD  MAGNESIUM MALATE PO Take 200 mg by mouth 2 (two) times daily.    Yes [provider]  nitroGLYCERIN (NITROSTAT) 0.4 MG SL tablet Place 1 tablet (0.4 mg total) under the tongue every 5 (five) minutes as needed for chest pain. No more than 3 tablets within 15 minutes. 11/19/17  Yes Isaiah Serge, NP  oxyCODONE-acetaminophen (PERCOCET) 10-325 MG tablet  Take 10-325 tablets by mouth every 4 (four) hours as needed (severe pain). Four to six times daily 04/14/16  Yes [provider]  RANEXA 500 MG 12 hr tablet TAKE 1 TABLET BY MOUTH TWICE A DAY Patient taking differently: Take 500 mg by mouth 2 (two) times daily.  05/13/17  Yes [name], MD  vitamin B-12 (CYANOCOBALAMIN) 500 MCG tablet Take 500 mcg by mouth daily.    Yes [provider]  RESTASIS 0.05 % ophthalmic emulsion Place 1 drop into both eyes 2 (two) times daily. 12/19/17   [provider]    Inpatient Medications: Scheduled Meds: . aspirin EC  81 mg Oral Daily  . clopidogrel  75 mg Oral Daily  . cycloSPORINE  1 drop Both Eyes BID  . enoxaparin (LOVENOX) injection  40 mg Subcutaneous QHS  . magnesium oxide  200 mg Oral BID  . nebivolol  2.5 mg Oral Daily  . ranolazine  500 mg Oral BID  . vitamin B-12  500 mcg Oral Daily   Continuous Infusions: . sodium chloride 75 mL/hr at 01/26/18 0019   PRN Meds: acetaminophen, ALPRAZolam, hydrALAZINE, HYDROmorphone (DILAUDID) injection, nitroGLYCERIN, ondansetron (ZOFRAN) IV, oxyCODONE-acetaminophen, zolpidem  Allergies:    Allergies  Allergen Reactions  . Atorvastatin Other (See Comments)    Joint pain Joint pain  . Crestor [Rosuvastatin Calcium] Other (See Comments)    Joint pain  . Metadate Cd [Methylphenidate Hcl Er (Cd)] Other (See Comments)    Burning, GI upset, bloating  . Other Other (See Comments)    Burning, GI upset, bloating  . Pravastatin Other (See Comments)  . Rosuvastatin Calcium Other (See Comments)    Joint pain  . Hydrocodone Nausea Only    Can take with food Can take with food  . Levofloxacin Nausea And Vomiting  . Levofloxacin In D5w Nausea And Vomiting  . Morphine And Related Nausea And Vomiting    Social History:   Social History   Socioeconomic History  . Marital status: Married    Spouse name: Not on file  . Number of children: 4  . Years of education: 72  .  Highest education level: Not on file  Occupational History  . Occupation: Geophysical data processor  Social Needs  . Financial resource strain: Not on file  . Food insecurity:    Worry: Not on file    Inability: Not on file  . Transportation needs:    Medical: Not on file    Non-medical: Not on file  Tobacco Use  . Smoking status: Former Smoker    Years: 10.00    Types: Cigarettes    Last attempt to quit: 01/04/1978    Years since quitting: 40.0  . Smokeless tobacco: Never Used  Substance and Sexual  Activity  . Alcohol use: No    Alcohol/week: 0.0 standard drinks    Comment: rarely  . Drug use: No  . Sexual activity: Not on file  Lifestyle  . Physical activity:    Days per week: Not on file    Minutes per session: Not on file  . Stress: Not on file  Relationships  . Social connections:    Talks on phone: Not on file    Gets together: Not on file    Attends religious service: Not on file    Active member of club or organization: Not on file    Attends meetings of clubs or organizations: Not on file    Relationship status: Not on file  . Intimate partner violence:    Fear of current or ex partner: Not on file    Emotionally abused: Not on file    Physically abused: Not on file    Forced sexual activity: Not on file  Other Topics Concern  . Not on file  Social History Narrative   Lives at home with his wife.   Right-handed.   2 cups caffeine per day.    Family History:    Family History  Problem Relation Age of Onset  . Heart disease Mother   . Stroke Mother   . Heart attack Mother   . Emphysema Father      ROS:  Please see the history of present illness.  General:no colds or fevers, no weight changes Skin:no rashes or ulcers HEENT:no blurred vision, no congestion CV:see HPI PUL:see HPI GI:no diarrhea constipation or melena, no indigestion GU:no hematuria, no dysuria MS:no joint pain, no claudication Neuro:no syncope, no lightheadedness Endo:no diabetes, no  thyroid disease  All other ROS reviewed and negative.     Physical Exam/Data:   Vitals:   01/25/18 2300 01/25/18 2349 01/26/18 0529 01/26/18 0758  BP: 106/71 123/89 (!) 126/94 111/62  Pulse: 63 65 66   Resp: 14     Temp:  98.2 F (36.8 C) 98.8 F (37.1 C) 98.4 F (36.9 C)  TempSrc:  Oral Oral Oral  SpO2: 96% 100% 98% 97%  Weight:  98.9 kg    Height:  6' (1.829 m)      Intake/Output Summary (Last 24 hours) at 01/26/2018 0947 Last data filed at 01/26/2018 0318 Gross per 24 hour  Intake 200.68 ml  Output 275 ml  Net -74.32 ml   Filed Weights   01/25/18 1939 01/25/18 2349  Weight: 97.1 kg 98.9 kg   Body mass index is 29.57 kg/m.  General:  Well nourished, well developed, in no acute distress HEENT: normal Lymph: no adenopathy Neck: no JVD Endocrine:  No thryomegaly Vascular: No carotid bruits; FA pulses 2+ bilaterally without bruits  Cardiac:  normal S1, S2; RRR; no murmur , gallup or rub Lungs:  clear to auscultation bilaterally, no wheezing, rhonchi or rales  Abd: soft, nontender, no hepatomegaly  Ext: no edema Musculoskeletal:  No deformities, BUE and BLE strength normal and equal Skin: warm and dry  Neuro:  CNs 2-12 intact, no focal abnormalities noted Psych:  Normal affect     Relevant CV Studies: Echo  09/11/17 Study Conclusions  - Left ventricle: The cavity size was normal. Wall thickness was   normal. Systolic function was normal. The estimated ejection   fraction was in the range of 55% to 60%. Wall motion was normal;   there were no regional wall motion abnormalities. Doppler   parameters are consistent with  abnormal left ventricular   relaxation (grade 1 diastolic dysfunction). - Aortic root: The aortic root was mildly dilated.  Impressions:  - Normal LV systolic function; probable mild diastolic dysfunction;   trace MR.  Cardiac cath 09/12/15  Ramus lesion, 95% stenosed.  1st Diag lesion, 90% stenosed.  Mid LAD to Dist LAD lesion, 50%  stenosed.  The left ventricular systolic function is normal.    Laboratory Data:  Chemistry Recent Labs  Lab 01/25/18 1941  NA 139  K 4.2  CL 105  CO2 26  GLUCOSE 94  BUN 13  CREATININE 1.22  CALCIUM 9.7  GFRNONAA >60  GFRAA >60  ANIONGAP 8    No results for input(s): PROT, ALBUMIN, AST, ALT, ALKPHOS, BILITOT in the last 168 hours. Hematology Recent Labs  Lab 01/25/18 1941  WBC 14.5*  RBC 3.90*  HGB 12.9*  HCT 38.9*  MCV 99.7  MCH 33.1  MCHC 33.2  RDW 12.4  PLT 200   Cardiac Enzymes Recent Labs  Lab 01/25/18 2321 01/26/18 0457  TROPONINI <0.03 <0.03    Recent Labs  Lab 01/25/18 1953  TROPIPOC 0.01    BNPNo results for input(s): BNP, PROBNP in the last 168 hours.  DDimer  Recent Labs  Lab 01/25/18 2321  DDIMER <0.27    Radiology/Studies:  Dg Chest 2 View  Result Date: 01/25/2018 CLINICAL DATA:  Chest pain EXAM: CHEST - 2 VIEW COMPARISON:  12/30/2015 chest radiograph. FINDINGS: Stable cardiomediastinal silhouette with normal heart size. No pneumothorax. No pleural effusion. No pulmonary edema. Mild bibasilar scarring versus atelectasis. IMPRESSION: Mild bibasilar scarring versus atelectasis. Otherwise no active disease. Electronically Signed   By: Ilona Sorrel M.D.   On: 01/25/2018 20:24   Ct Chest Wo Contrast  Result Date: 01/26/2018 CLINICAL DATA:  Worsening hypoxemia. Upper chest pain and shortness of breath for several days. EXAM: CT CHEST WITHOUT CONTRAST TECHNIQUE: Multidetector CT imaging of the chest was performed following the standard protocol without IV contrast. COMPARISON:  Chest x-ray January 25, 2018 and chest CT October 2017 FINDINGS: Cardiovascular: Coronary artery calcifications seen in the left coronary arteries. The thoracic aorta is nonaneurysmal. The main pulmonary artery is normal in caliber. The heart is normal in size. Mediastinum/Nodes: No enlarged mediastinal or axillary lymph nodes. Thyroid gland, trachea, and esophagus  demonstrate no significant findings. Lungs/Pleura: Central airways are normal. No pneumothorax. Emphysematous changes in the lungs. Platelike opacities in the right middle lobe, lingula, and bases is all most likely worsening atelectasis. The opacity is most prominent in the right base and a superimposed developing infiltrate/pneumonia is not completely excluded. 4.5 mm nodule in the left base on series 4, image 90. 3 mm nodule in the right base on series 4, image 66. A calcified granuloma seen in the right lung on image 79. No other nodules. No suspicious masses. Upper Abdomen: Stones are seen in the right kidney with no obstruction. An exophytic mass off the left kidney on series 3, image 156 measures up to 2.1 cm with an attenuation of greater than 20 Hounsfield units. No other abnormalities. Musculoskeletal: Loss of height of an upper thoracic vertebral body on sagittal image 91, stable since the comparison chest CT. IMPRESSION: 1. Opacities in the right middle lobe, lingula, and bases are primarily atelectasis. The opacity is most prominent in the right base and a superimposed developing infiltrate is not completely excluded. However, I favor the findings are all most likely worsening atelectasis. Recommend clinical correlation and attention on follow-up. 2. There is  an exophytic mass measuring 2.1 cm off the left kidney with an attenuation of 21 are 22 Hounsfield units. This mass may simply be a complicated cyst but is indeterminate on this study. Recommend an ultrasound in an attempt to confirm the cystic nature of this mass. 3. Coronary artery calcifications. 4. Emphysematous changes in the lungs. 5. 4.5 mm nodule in the left base and 3 mm nodule in the right base. No follow-up needed if patient is low-risk. Non-contrast chest CT can be considered in 12 months if patient is high-risk. This recommendation follows the consensus statement: Guidelines for Management of Incidental Pulmonary Nodules Detected on CT  Images: From the Fleischner Society 2017; Radiology 2017; [id number]:[id number]. 6. Right nephrolithiasis with no obstruction. Emphysema (ICD10-J43.9). Electronically Signed   By: Dorise Bullion III M.D   On: 01/26/2018 09:28    Assessment and Plan:   1. Chest pain neg MI.  May be muscular skeletal- Dr. Oval Linsey to see.  Possible nuc he is NPO 2. CAD as above 3. HLD continue statin. 4. Elevated WBC blood cultures pending.        For questions or updates, please contact [place] Please consult [email or web address] for contact info under     Signed, Cecilie Kicks, NP  01/26/2018 9:47 AM

## 2018-01-26 NOTE — Progress Notes (Addendum)
PROGRESS NOTE    Johnny [name]  XTK:[id number] DOB: [birth year] DOA: 01/25/2018 PCP: [name], MD   Brief Narrative:  Johnny [name] is a 67 y.o. male with medical history significant of hypertension, hyperlipidemia, TIA, GERD, CAD, myocardial infarction, who presents with chest pain.  Cardiac cath in 2017 revealed a ramus lesion at 95% stenosed; first diagonal lesion 90% stenosed; mid LAD to distal LAD lesion 50% stenosed; and left ventricular systolic function which was normal.  Patient was placed on medical management with plan for possible intervention if chest pain developed. Pt states that his CP started after trying to help his 200 pound son who is in a wheelchair to get into the house at about 5:30 PM. His chest pain is located in the substernal area, initially 10 out of 10 severity, currently 3 out of 10 severity, pressure-like, radiating to the neck bilaterally.   Has ruled out for myocardial infarction overnight however his symptoms although sound atypical the only way to determine if this is a cardiac issue is to proceed with cardiac catheterization.  Dr. Oval Linsey has plan for cardiac catheterization tomorrow and echocardiogram has been ordered for today.  Assessment & Plan:   Principal Problem:   Chest pain Active Problems:   HYPERCHOLESTEROLEMIA   Essential hypertension   CAD in native artery   TIA (transient ischemic attack)   Leukocytosis   Unstable angina (HCC)   Atelectasis of both lungs  Chest pain: Etiology is not clear at this may be unstable angina.    Leukocytosis has resolved a noncontrast CT scan of the chest did not show any significant evidence of pneumonia.  Patient does have a lot of atelectasis. Chest pain is pleuritic, but d-dimer negative, less likely to have PE.  - will continue tele bed for obs -Patient has ruled out by serial enzymes and EKG - prn Nitroglycerin, dilaudid, and aspirin, Ranexa, Bystolic - pt is allergic to statin and has refused  Zetia today - Risk factor stratification:    FLP shows elevated triglycerides but patient has refused that he until results of cardiac cath tomorrow  UDS shows positive for opiates but patient takes Percocet at home.  A1C is 5.3 with no indication of diabetes -Cardiology has ordered echo results are pending. -Cardiology consult very much appreciated  HYPERCHOLESTEROLEMIA: LDL was 123 on 11/20/15. Pt is allergic to statin -f/u FLP.  Triglycerides are elevated.  (217) Zetia offered but patient refused.  HTN:  -Continue home medications: Bystolic -IV hydralazine prn  TIA (transient ischemic attack): -on plavix  Leukocytosis:  Resolved and CT of the chest is not significant for pneumonia.  Patient did not receive antibiotics.  Left renal mass (exophytic): Patient will need outpatient ultrasound of the kidneys for evaluation.  This was found incidentally.  DVT ppx:  SQ Lovenox Code Status: Full code Family Communication:  Yes, patient's giralfriend at bed side Disposition Plan:  Anticipate discharge back to previous home environment Consults called:  none Admission status: Obs / tele    Consultants:  Dr. Skeet Latch from cardiology  Procedures: Echocardiogram results pending.    Subjective: Patient somewhat better today but still with discomfort in his chest.  Given his elevated white blood cell count yesterday I have repeated his CBC and his white count is down to 10 without antibiotics.  Noncontrast CT scan of the chest revealed a renal mass recommending an ultrasound as well as atelectasis.  No definite diagnosis of pneumonia.  Radiologist favored atelectasis as his diagnosis.  Objective: Vitals:   01/26/18 0529 01/26/18 0758 01/26/18 1200 01/26/18 1234  BP: (!) 126/94 111/62 122/71   Pulse: 66     Resp:   16   Temp: 98.8 F (37.1 C) 98.4 F (36.9 C) 98.1 F (36.7 C)   TempSrc: Oral Oral Oral   SpO2: 98% 97% 98%   Weight:      Height:    5\' 10"  (1.778 m)     Intake/Output Summary (Last 24 hours) at 01/26/2018 1249 Last data filed at 01/26/2018 0318 Gross per 24 hour  Intake 200.68 ml  Output 275 ml  Net -74.32 ml   Filed Weights   01/25/18 1939 01/25/18 2349  Weight: 97.1 kg 98.9 kg    Examination:  General exam: Appears calm and comfortable  Respiratory system: Clear to auscultation. Respiratory effort normal. Cardiovascular system: S1 & S2 heard, RRR. No JVD, murmurs, rubs, gallops or clicks. No pedal edema. Gastrointestinal system: Abdomen is nondistended, soft and nontender. No organomegaly or masses felt. Normal bowel sounds heard. Central nervous system: Alert and oriented. No focal neurological deficits. Extremities: Symmetric 5 x 5 power. Skin: No rashes, lesions or ulcers Psychiatry: Judgement and insight appear normal. Mood & affect appropriate.     Data Reviewed: I have personally reviewed following labs and imaging studies  CBC: Recent Labs  Lab 01/25/18 1941 01/26/18 0945  WBC 14.5* 10.4  NEUTROABS  --  6.3  HGB 12.9* 13.2  HCT 38.9* 40.2  MCV 99.7 100.0  PLT 200 998   Basic Metabolic Panel: Recent Labs  Lab 01/25/18 1941  NA 139  K 4.2  CL 105  CO2 26  GLUCOSE 94  BUN 13  CREATININE 1.22  CALCIUM 9.7   GFR: Estimated Creatinine Clearance: 71.2 mL/min (by C-G formula based on SCr of 1.22 mg/dL).  Cardiac Enzymes: Recent Labs  Lab 01/25/18 2321 01/26/18 0457 01/26/18 0945  TROPONINI <0.03 <0.03 <0.03   HbA1C: Recent Labs    01/26/18 0457  HGBA1C 5.3   Lipid Profile: Recent Labs    01/26/18 0457  CHOL 177  HDL 37*  LDLCALC 97  TRIG 217*  CHOLHDL 4.8    No results found for this or any previous visit (from the past 240 hour(s)).     Radiology Studies: Dg Chest 2 View  Result Date: 01/25/2018 CLINICAL DATA:  Chest pain EXAM: CHEST - 2 VIEW COMPARISON:  12/30/2015 chest radiograph. FINDINGS: Stable cardiomediastinal silhouette with normal heart size. No pneumothorax. No  pleural effusion. No pulmonary edema. Mild bibasilar scarring versus atelectasis. IMPRESSION: Mild bibasilar scarring versus atelectasis. Otherwise no active disease. Electronically Signed   By: Ilona Sorrel M.D.   On: 01/25/2018 20:24   Ct Chest Wo Contrast  Result Date: 01/26/2018 CLINICAL DATA:  Worsening hypoxemia. Upper chest pain and shortness of breath for several days. EXAM: CT CHEST WITHOUT CONTRAST TECHNIQUE: Multidetector CT imaging of the chest was performed following the standard protocol without IV contrast. COMPARISON:  Chest x-ray January 25, 2018 and chest CT October 2017 FINDINGS: Cardiovascular: Coronary artery calcifications seen in the left coronary arteries. The thoracic aorta is nonaneurysmal. The main pulmonary artery is normal in caliber. The heart is normal in size. Mediastinum/Nodes: No enlarged mediastinal or axillary lymph nodes. Thyroid gland, trachea, and esophagus demonstrate no significant findings. Lungs/Pleura: Central airways are normal. No pneumothorax. Emphysematous changes in the lungs. Platelike opacities in the right middle lobe, lingula, and bases is all most likely worsening atelectasis. The opacity is most prominent in  the right base and a superimposed developing infiltrate/pneumonia is not completely excluded. 4.5 mm nodule in the left base on series 4, image 90. 3 mm nodule in the right base on series 4, image 66. A calcified granuloma seen in the right lung on image 79. No other nodules. No suspicious masses. Upper Abdomen: Stones are seen in the right kidney with no obstruction. An exophytic mass off the left kidney on series 3, image 156 measures up to 2.1 cm with an attenuation of greater than 20 Hounsfield units. No other abnormalities. Musculoskeletal: Loss of height of an upper thoracic vertebral body on sagittal image 91, stable since the comparison chest CT. IMPRESSION: 1. Opacities in the right middle lobe, lingula, and bases are primarily atelectasis. The  opacity is most prominent in the right base and a superimposed developing infiltrate is not completely excluded. However, I favor the findings are all most likely worsening atelectasis. Recommend clinical correlation and attention on follow-up. 2. There is an exophytic mass measuring 2.1 cm off the left kidney with an attenuation of 21 are 22 Hounsfield units. This mass may simply be a complicated cyst but is indeterminate on this study. Recommend an ultrasound in an attempt to confirm the cystic nature of this mass. 3. Coronary artery calcifications. 4. Emphysematous changes in the lungs. 5. 4.5 mm nodule in the left base and 3 mm nodule in the right base. No follow-up needed if patient is low-risk. Non-contrast chest CT can be considered in 12 months if patient is high-risk. This recommendation follows the consensus statement: Guidelines for Management of Incidental Pulmonary Nodules Detected on CT Images: From the Fleischner Society 2017; Radiology 2017; 284:228-243. 6. Right nephrolithiasis with no obstruction. Emphysema (ICD10-J43.9). Electronically Signed   By: Dorise Bullion III M.D   On: 01/26/2018 09:28        Scheduled Meds: . aspirin EC  81 mg Oral Daily  . clopidogrel  75 mg Oral Daily  . cycloSPORINE  1 drop Both Eyes BID  . enoxaparin (LOVENOX) injection  40 mg Subcutaneous QHS  . ezetimibe  10 mg Oral Daily  . magnesium oxide  200 mg Oral BID  . nebivolol  2.5 mg Oral Daily  . ranolazine  500 mg Oral BID  . vitamin B-12  500 mcg Oral Daily   Continuous Infusions: . sodium chloride 75 mL/hr at 01/26/18 0019     LOS: 0 days    Time spent: 42 minutes    Lady Deutscher, MD [facility] Pager [phone]  If 7PM-7AM, please contact night-coverage [email or web address] Password [facility] 01/26/2018, 12:49 PM

## 2018-01-26 NOTE — Progress Notes (Signed)
Pt refused Zetia this am. He and his wife felt nervous about taking any cholesterol medication due to issues with statins in the past. I explained the purpose and that it isn't a statin. He stated he would rather see what the cath shows tomorrow and then decide

## 2018-01-27 ENCOUNTER — Encounter (HOSPITAL_COMMUNITY)
Admission: EM | Disposition: A | Discharge status: Home / Self Care | Payer: Self-pay | Source: Home / Self Care | Attending: Emergency Medicine

## 2018-01-27 ENCOUNTER — Telehealth: Payer: Self-pay | Admitting: Cardiovascular Disease

## 2018-01-27 ENCOUNTER — Encounter (HOSPITAL_COMMUNITY): Payer: Self-pay | Admitting: Interventional Cardiology

## 2018-01-27 DIAGNOSIS — I25110 Atherosclerotic heart disease of native coronary artery with unstable angina pectoris: Secondary | ICD-10-CM | POA: Diagnosis not present

## 2018-01-27 DIAGNOSIS — I25118 Atherosclerotic heart disease of native coronary artery with other forms of angina pectoris: Secondary | ICD-10-CM

## 2018-01-27 DIAGNOSIS — I208 Other forms of angina pectoris: Secondary | ICD-10-CM | POA: Diagnosis not present

## 2018-01-27 HISTORY — PX: LEFT HEART CATH AND CORONARY ANGIOGRAPHY: CATH118249

## 2018-01-27 LAB — CBC
HEMATOCRIT: 38 % — AB (ref 39.0–52.0)
HEMOGLOBIN: 12.3 g/dL — AB (ref 13.0–17.0)
MCH: 32.3 pg (ref 26.0–34.0)
MCHC: 32.4 g/dL (ref 30.0–36.0)
MCV: 99.7 fL (ref 78.0–100.0)
Platelets: 167 10*3/uL (ref 150–400)
RBC: 3.81 MIL/uL — AB (ref 4.22–5.81)
RDW: 12.2 % (ref 11.5–15.5)
WBC: 9.2 10*3/uL (ref 4.0–10.5)

## 2018-01-27 LAB — BASIC METABOLIC PANEL WITH GFR
Anion gap: 6 (ref 5–15)
BUN: 14 mg/dL (ref 8–23)
CO2: 24 mmol/L (ref 22–32)
Calcium: 8.9 mg/dL (ref 8.9–10.3)
Chloride: 109 mmol/L (ref 98–111)
Creatinine, Ser: 1.19 mg/dL (ref 0.61–1.24)
GFR calc Af Amer: >60 mL/min
GFR calc non Af Amer: >60 mL/min
Glucose, Bld: 95 mg/dL (ref 70–99)
Potassium: 3.9 mmol/L (ref 3.5–5.1)
Sodium: 139 mmol/L (ref 135–145)

## 2018-01-27 SURGERY — LEFT HEART CATH AND CORONARY ANGIOGRAPHY
Anesthesia: LOCAL

## 2018-01-27 MED ORDER — SODIUM CHLORIDE 0.9% FLUSH: 3.0000 mL | Freq: Two times a day (BID) | INTRAVENOUS | Status: DC

## 2018-01-27 MED ORDER — FENTANYL CITRATE (PF) 100 MCG/2ML IJ SOLN
INTRAMUSCULAR | Status: DC | PRN
  Administered 2018-01-27: 25 ug via INTRAVENOUS

## 2018-01-27 MED ORDER — VERAPAMIL HCL 2.5 MG/ML IV SOLN
INTRAVENOUS | Status: AC
  Filled 2018-01-27: qty 2

## 2018-01-27 MED ORDER — EZETIMIBE 10 MG PO TABS: 10.0000 mg | ORAL_TABLET | Freq: Every day | ORAL | 0 refills | Status: DC

## 2018-01-27 MED ORDER — ONDANSETRON HCL 4 MG/2ML IJ SOLN: 4.0000 mg | Freq: Four times a day (QID) | INTRAMUSCULAR | Status: DC | PRN

## 2018-01-27 MED ORDER — SODIUM CHLORIDE 0.9 % IV SOLN: INTRAVENOUS | Status: AC

## 2018-01-27 MED ORDER — LIDOCAINE HCL (PF) 1 % IJ SOLN
INTRAMUSCULAR | Status: DC | PRN
  Administered 2018-01-27: 2 mL

## 2018-01-27 MED ORDER — HEPARIN SODIUM (PORCINE) 1000 UNIT/ML IJ SOLN
INTRAMUSCULAR | Status: DC | PRN
  Administered 2018-01-27: 5000 [IU] via INTRAVENOUS

## 2018-01-27 MED ORDER — HEPARIN (PORCINE) IN NACL 1000-0.9 UT/500ML-% IV SOLN
INTRAVENOUS | Status: DC | PRN
  Administered 2018-01-27 (×2): 500 mL

## 2018-01-27 MED ORDER — FENTANYL CITRATE (PF) 100 MCG/2ML IJ SOLN
INTRAMUSCULAR | Status: AC
  Filled 2018-01-27: qty 2

## 2018-01-27 MED ORDER — ACETAMINOPHEN 325 MG PO TABS: 650.0000 mg | ORAL_TABLET | ORAL | Status: DC | PRN

## 2018-01-27 MED ORDER — MIDAZOLAM HCL 2 MG/2ML IJ SOLN
INTRAMUSCULAR | Status: DC | PRN
  Administered 2018-01-27: 2 mg via INTRAVENOUS

## 2018-01-27 MED ORDER — SODIUM CHLORIDE 0.9% FLUSH: 3.0000 mL | INTRAVENOUS | Status: DC | PRN

## 2018-01-27 MED ORDER — SODIUM CHLORIDE 0.9 % IV SOLN: 250.0000 mL | INTRAVENOUS | Status: DC | PRN

## 2018-01-27 MED ORDER — ASPIRIN 81 MG PO TBEC: 81.0000 mg | DELAYED_RELEASE_TABLET | Freq: Every day | ORAL | 0 refills | Status: DC

## 2018-01-27 MED ORDER — HEPARIN (PORCINE) IN NACL 1000-0.9 UT/500ML-% IV SOLN
INTRAVENOUS | Status: AC
  Filled 2018-01-27: qty 1000

## 2018-01-27 MED ORDER — MIDAZOLAM HCL 2 MG/2ML IJ SOLN
INTRAMUSCULAR | Status: AC
  Filled 2018-01-27: qty 2

## 2018-01-27 MED ORDER — VERAPAMIL HCL 2.5 MG/ML IV SOLN
INTRAVENOUS | Status: DC | PRN
  Administered 2018-01-27: 14:00:00 via INTRA_ARTERIAL
  Administered 2018-01-27: 10 mL via INTRA_ARTERIAL

## 2018-01-27 MED ORDER — LORAZEPAM 2 MG/ML IJ SOLN: 0.5000 mg | INTRAMUSCULAR | Status: DC | PRN

## 2018-01-27 MED ORDER — LIDOCAINE HCL (PF) 1 % IJ SOLN
INTRAMUSCULAR | Status: AC
  Filled 2018-01-27: qty 30

## 2018-01-27 MED ORDER — IOHEXOL 350 MG/ML SOLN
INTRAVENOUS | Status: DC | PRN
  Administered 2018-01-27: 55 mL via INTRA_ARTERIAL

## 2018-01-27 MED ORDER — HEPARIN SODIUM (PORCINE) 1000 UNIT/ML IJ SOLN
INTRAMUSCULAR | Status: AC
  Filled 2018-01-27: qty 1

## 2018-01-27 SURGICAL SUPPLY — 9 items
CATH 5FR JL3.5 JR4 ANG PIG MP (CATHETERS) ×2 IMPLANT
DEVICE RAD COMP TR BAND LRG (VASCULAR PRODUCTS) ×2 IMPLANT
GLIDESHEATH SLEND SS 6F .021 (SHEATH) ×2 IMPLANT
GUIDEWIRE INQWIRE 1.5J.035X260 (WIRE) ×1 IMPLANT
INQWIRE 1.5J .035X260CM (WIRE) ×2
KIT HEART LEFT (KITS) ×2 IMPLANT
PACK CARDIAC CATHETERIZATION (CUSTOM PROCEDURE TRAY) ×2 IMPLANT
TRANSDUCER W/STOPCOCK (MISCELLANEOUS) ×2 IMPLANT
TUBING CIL FLEX 10 FLL-RA (TUBING) ×2 IMPLANT

## 2018-01-27 NOTE — Telephone Encounter (Signed)
° ° °  TOC  9/24 w/ Servando Snare

## 2018-01-27 NOTE — Discharge Summary (Signed)
Physician Discharge Summary  Johnny [name] JQB:[id number] DOB: 1950-12-22 DOA: 01/25/2018  PCP: [name], MD  Admit date: 01/25/2018 Discharge date: 01/27/2018  Admitted From: Home Disposition: Home  Recommendations for Outpatient Follow-up:  1. Follow up with PCP in 1  weeks 2. Please obtain BMP/CBC in one week 3. Follow-up with cardiology in 2 weeks 4. Patient will need outpatient ultrasound of the kidneys due to incidental renal mass noted  Home Health: No Equipment/Devices: None  Discharge Condition: Stable CODE STATUS: Full code Diet recommendation: Heart Healthy   Brief/Interim Summary: [place] Johnny [name] a 67 y.o.malewith medical history significant ofhypertension, hyperlipidemia, TIA, GERD, CAD, myocardial infarction, who presents with chest pain.  Cardiac cath in 2017 revealed a ramus lesion at 95% stenosed; first diagonal lesion 90% stenosed; mid LAD to distal LAD lesion 50% stenosed; and left ventricular systolic function which was normal.  Patient was placed on medical management with plan for possible intervention if chest pain developed. Pt states that his CP startedafter trying to help his 200 pound son who is in a wheelchair to get into the houseat about 5:30 PM. Hischest pain is located in the substernal area, initially 10 out of 10 severity, currently 3 out of 10 severity, pressure-like, radiating to the neck bilaterally.  Has ruled out for myocardial infarction overnight however his symptoms although sound atypical the only way to determine if this is a cardiac issue is to proceed with cardiac catheterization.    To [facility] showed normal left ventricular systolic function with grade 1 diastolic dysfunction.  Cardiac catheterization performed 01/27/2018 was unchanged from 2017 and medical management was recommended.  Please see below  Patient has reached maximal benefit of hospitalization.  Discharge diagnosis, prognosis, plans, follow-up, medications and  treatments discussed with the patient(or responsible party) and is in agreement with the plans as described.  Patient is stable for discharge.  Discharge Diagnoses:  Principal Problem:   Chest pain Active Problems:   HYPERCHOLESTEROLEMIA   Essential hypertension   CAD in native artery   TIA (transient ischemic attack)   Unstable angina (HCC)   Atelectasis of both lungs   Renal mass  Chest pain: Cardiac catheterization revealed symptoms unchanged from prior.  Patient appears to have stable angina.  Further intervention is not indicated due to the nature of the lesions.  We will continue with medical management.  Patient to follow-up with cardiology as noted above.  HYPERCHOLESTEROLEMIA:LDL was 123 on 11/20/15. Pt is allergic to statin -f/u FLP.  Triglycerides are elevated.  (217) Zetia offered but patient refused.  Recommend Zetia at discharge  HTN:  -Continue home medications:Bystolic -IV hydralazine prn  TIA (transient ischemic attack): -on plavix  Leukocytosis: Resolved and CT of the chest is not significant for pneumonia.  Patient did not receive antibiotics.  Left renal mass (exophytic): Patient will need outpatient ultrasound of the kidneys for evaluation.  This was found incidentally.  Discharge Instructions  Discharge Instructions    Call MD for:  difficulty breathing, headache or visual disturbances   Complete by:  As directed    Call MD for:  severe uncontrolled pain   Complete by:  As directed    Diet - low sodium heart healthy   Complete by:  As directed    Diet - low sodium heart healthy   Complete by:  As directed    Discharge instructions   Complete by:  As directed    Make and keep follow-up appointment with cardiology in 2 weeks. Follow-up  with your primary care doctor, Tedra Senegal, MD.  In 1 week.  You will need to have a chemistry profile as well as a CBC done.   Discharge instructions   Complete by:  As directed    You will need evaluation  of mass noted on kidneys with bilateral ultrasound performed in the next 4 to 6 weeks   Increase activity slowly   Complete by:  As directed    Increase activity slowly   Complete by:  As directed      Allergies as of 01/27/2018      Reactions   Atorvastatin Other (See Comments)   Joint pain Joint pain   Crestor [rosuvastatin Calcium] Other (See Comments)   Joint pain   Metadate Cd [methylphenidate Hcl Er (cd)] Other (See Comments)   Burning, GI upset, bloating   Other Other (See Comments)   Burning, GI upset, bloating   Pravastatin Other (See Comments)   Rosuvastatin Calcium Other (See Comments)   Joint pain   Hydrocodone Nausea Only   Can take with food Can take with food   Levofloxacin Nausea And Vomiting   Levofloxacin In D5w Nausea And Vomiting   Morphine And Related Nausea And Vomiting      Medication List    TAKE these medications   aspirin 81 MG EC tablet Take 1 tablet (81 mg total) by mouth daily. Start taking on:  3/53/6144   BYSTOLIC 2.5 MG tablet Generic drug:  nebivolol TAKE 1 TABLET BY MOUTH EVERY DAY MUST KEEP APPOINTMENT FOR FURTHER REFILL What changed:  See the new instructions.   CALCIUM MAGNESIUM PO Take 1 tablet by mouth daily.   clopidogrel 75 MG tablet Commonly known as:  PLAVIX TAKE 1 TABLET BY MOUTH EVERY DAY   ezetimibe 10 MG tablet Commonly known as:  ZETIA Take 1 tablet (10 mg total) by mouth daily. Start taking on:  01/28/2018   MAGNESIUM MALATE PO Take 200 mg by mouth 2 (two) times daily.   nitroGLYCERIN 0.4 MG SL tablet Commonly known as:  NITROSTAT Place 1 tablet (0.4 mg total) under the tongue every 5 (five) minutes as needed for chest pain. No more than 3 tablets within 15 minutes.   oxyCODONE-acetaminophen 10-325 MG tablet Commonly known as:  PERCOCET Take 10-325 tablets by mouth every 4 (four) hours as needed (severe pain). Four to six times daily   RANEXA 500 MG 12 hr tablet Generic drug:  ranolazine TAKE 1 TABLET BY  MOUTH TWICE A DAY What changed:  how much to take   RESTASIS 0.05 % ophthalmic emulsion Generic drug:  cycloSPORINE Place 1 drop into both eyes 2 (two) times daily.   vitamin B-12 500 MCG tablet Commonly known as:  CYANOCOBALAMIN Take 500 mcg by mouth daily.      Follow-up Information    Burtis Junes, NP Follow up on 02/11/2018.   Specialties:  Nurse Practitioner, Interventional Cardiology, Cardiology, Radiology Why:  10:00 AM TCM Contact information: [place]. [address] [place] [address] [phone]          Allergies  Allergen Reactions  . Atorvastatin Other (See Comments)    Joint pain Joint pain  . Crestor [Rosuvastatin Calcium] Other (See Comments)    Joint pain  . Metadate Cd [Methylphenidate Hcl Er (Cd)] Other (See Comments)    Burning, GI upset, bloating  . Other Other (See Comments)    Burning, GI upset, bloating  . Pravastatin Other (See Comments)  . Rosuvastatin Calcium  Other (See Comments)    Joint pain  . Hydrocodone Nausea Only    Can take with food Can take with food  . Levofloxacin Nausea And Vomiting  . Levofloxacin In D5w Nausea And Vomiting  . Morphine And Related Nausea And Vomiting    Consultations: [facility] cardiology  Procedures/Studies:  Left heart cardiac catheterization on 01/27/2018:  1st Diag lesion is 90% stenosed.  Ramus lesion is 95% stenosed.  Dist LAD lesion is 50% stenosed. This area appears improved since the last cath.  2nd Diag lesion is 90% stenosed.  1st Mrg lesion is 80% stenosed.  Ost 1st Diag lesion is 80% stenosed.  The left ventricular systolic function is normal.  LV end diastolic pressure is normal.  The left ventricular ejection fraction is 55-65% by visual estimate.  There is no aortic valve stenosis.   Continue medical therapy for branch vessel CAD.  The diagonals and OM are small vessels.  No significant change since the last cath in 2017.   Dg Chest 2 View  Result Date:  01/25/2018 CLINICAL DATA:  Chest pain EXAM: CHEST - 2 VIEW COMPARISON:  12/30/2015 chest radiograph. FINDINGS: Stable cardiomediastinal silhouette with normal heart size. No pneumothorax. No pleural effusion. No pulmonary edema. Mild bibasilar scarring versus atelectasis. IMPRESSION: Mild bibasilar scarring versus atelectasis. Otherwise no active disease. Electronically Signed   By: Ilona Sorrel M.D.   On: 01/25/2018 20:24   Ct Chest Wo Contrast  Result Date: 01/26/2018 CLINICAL DATA:  Worsening hypoxemia. Upper chest pain and shortness of breath for several days. EXAM: CT CHEST WITHOUT CONTRAST TECHNIQUE: Multidetector CT imaging of the chest was performed following the standard protocol without IV contrast. COMPARISON:  Chest x-ray January 25, 2018 and chest CT October 2017 FINDINGS: Cardiovascular: Coronary artery calcifications seen in the left coronary arteries. The thoracic aorta is nonaneurysmal. The main pulmonary artery is normal in caliber. The heart is normal in size. Mediastinum/Nodes: No enlarged mediastinal or axillary lymph nodes. Thyroid gland, trachea, and esophagus demonstrate no significant findings. Lungs/Pleura: Central airways are normal. No pneumothorax. Emphysematous changes in the lungs. Platelike opacities in the right middle lobe, lingula, and bases is all most likely worsening atelectasis. The opacity is most prominent in the right base and a superimposed developing infiltrate/pneumonia is not completely excluded. 4.5 mm nodule in the left base on series 4, image 90. 3 mm nodule in the right base on series 4, image 66. A calcified granuloma seen in the right lung on image 79. No other nodules. No suspicious masses. Upper Abdomen: Stones are seen in the right kidney with no obstruction. An exophytic mass off the left kidney on series 3, image 156 measures up to 2.1 cm with an attenuation of greater than 20 Hounsfield units. No other abnormalities. Musculoskeletal: Loss of height of an  upper thoracic vertebral body on sagittal image 91, stable since the comparison chest CT. IMPRESSION: 1. Opacities in the right middle lobe, lingula, and bases are primarily atelectasis. The opacity is most prominent in the right base and a superimposed developing infiltrate is not completely excluded. However, I favor the findings are all most likely worsening atelectasis. Recommend clinical correlation and attention on follow-up. 2. There is an exophytic mass measuring 2.1 cm off the left kidney with an attenuation of 21 are 22 Hounsfield units. This mass may simply be a complicated cyst but is indeterminate on this study. Recommend an ultrasound in an attempt to confirm the cystic nature of this mass. 3. Coronary artery  calcifications. 4. Emphysematous changes in the lungs. 5. 4.5 mm nodule in the left base and 3 mm nodule in the right base. No follow-up needed if patient is low-risk. Non-contrast chest CT can be considered in 12 months if patient is high-risk. This recommendation follows the consensus statement: Guidelines for Management of Incidental Pulmonary Nodules Detected on CT Images: From the Fleischner Society 2017; Radiology 2017; 284:228-243. 6. Right nephrolithiasis with no obstruction. Emphysema (ICD10-J43.9). Electronically Signed   By: Dorise Bullion III M.D   On: 01/26/2018 09:28    Echocardiogram revealed normal left ventricular systolic function with grade 1 diastolic dysfunction   Subjective: Patient with no new complaints.  No further episodes of chest pain.  Discharge Exam: Vitals:   01/27/18 0452 01/27/18 1403  BP: 120/85   Pulse: (!) 58   Resp: 14   Temp: 98.3 F (36.8 C)   SpO2: 96% 100%   Vitals:   01/26/18 1952 01/26/18 2205 01/27/18 0452 01/27/18 1403  BP: 134/86 135/90 120/85   Pulse: 70  (!) 58   Resp: 19 (!) 22 14   Temp: 98.4 F (36.9 C)  98.3 F (36.8 C)   TempSrc: Oral  Oral   SpO2: 98%  96% 100%  Weight:   95.9 kg   Height:        General: Pt is  alert, awake, not in acute distress Cardiovascular: RRR, S1/S2 +, no rubs, no gallops Respiratory: CTA bilaterally, no wheezing, no rhonchi Abdominal: Soft, NT, ND, bowel sounds + Extremities: no edema, no cyanosis    The results of significant diagnostics from this hospitalization (including imaging, microbiology, ancillary and laboratory) are listed below for reference.     Microbiology: Recent Results (from the past 240 hour(s))  Culture, blood (Routine X 2) w Reflex to ID Panel     Status: None (Preliminary result)   Collection Time: 01/25/18 11:21 PM  Result Value Ref Range Status   Specimen Description BLOOD RIGHT ANTECUBITAL  Final   Special Requests   Final    BOTTLES DRAWN AEROBIC AND ANAEROBIC Blood Culture adequate volume   Culture   Final    NO GROWTH 1 DAY Performed at [facility], [address], [place], [place] [address]    Report Status PENDING  Incomplete  Culture, blood (Routine X 2) w Reflex to ID Panel     Status: None (Preliminary result)   Collection Time: 01/25/18 11:31 PM  Result Value Ref Range Status   Specimen Description BLOOD LEFT HAND  Final   Special Requests   Final    BOTTLES DRAWN AEROBIC AND ANAEROBIC Blood Culture adequate volume   Culture   Final    NO GROWTH 1 DAY Performed at [facility], [place] [address], [place], [place] [address]    Report Status PENDING  Incomplete     Labs: Basic Metabolic Panel: Recent Labs  Lab 01/25/18 1941 01/27/18 0444  NA 139 139  K 4.2 3.9  CL 105 109  CO2 26 24  GLUCOSE 94 95  BUN 13 14  CREATININE 1.22 1.19  CALCIUM 9.7 8.9   CBC: Recent Labs  Lab 01/25/18 1941 01/26/18 0945 01/27/18 0444  WBC 14.5* 10.4 9.2  NEUTROABS  --  6.3  --   HGB 12.9* 13.2 12.3*  HCT 38.9* 40.2 38.0*  MCV 99.7 100.0 99.7  PLT 200 194 167   Cardiac Enzymes: Recent Labs  Lab 01/25/18 2321 01/26/18 0457 01/26/18 0945  TROPONINI <0.03 <0.03 <  0.03   D-Dimer Recent Labs    01/25/18 2321   DDIMER <0.27   Hgb A1c Recent Labs    01/26/18 0457  HGBA1C 5.3   Lipid Profile Recent Labs    01/26/18 0457  CHOL 177  HDL 37*  LDLCALC 97  TRIG 217*  CHOLHDL 4.8   Urinalysis    Component Value Date/Time   COLORURINE YELLOW 01/26/2018 0311   APPEARANCEUR CLEAR 01/26/2018 0311   LABSPEC 1.013 01/26/2018 0311   PHURINE 7.0 01/26/2018 0311   GLUCOSEU NEGATIVE 01/26/2018 0311   HGBUR NEGATIVE 01/26/2018 0311   BILIRUBINUR NEGATIVE 01/26/2018 0311   BILIRUBINUR neg 06/24/2015 1508   KETONESUR NEGATIVE 01/26/2018 0311   PROTEINUR NEGATIVE 01/26/2018 0311   UROBILINOGEN 0.2 06/24/2015 1508   UROBILINOGEN 0.2 10/26/2014 0337   NITRITE NEGATIVE 01/26/2018 0311   LEUKOCYTESUR NEGATIVE 01/26/2018 0311   Microbiology Recent Results (from the past 240 hour(s))  Culture, blood (Routine X 2) w Reflex to ID Panel     Status: None (Preliminary result)   Collection Time: 01/25/18 11:21 PM  Result Value Ref Range Status   Specimen Description BLOOD RIGHT ANTECUBITAL  Final   Special Requests   Final    BOTTLES DRAWN AEROBIC AND ANAEROBIC Blood Culture adequate volume   Culture   Final    NO GROWTH 1 DAY Performed at [facility], [place] [address] [place], [place] [address]    Report Status PENDING  Incomplete  Culture, blood (Routine X 2) w Reflex to ID Panel     Status: None (Preliminary result)   Collection Time: 01/25/18 11:31 PM  Result Value Ref Range Status   Specimen Description BLOOD LEFT HAND  Final   Special Requests   Final    BOTTLES DRAWN AEROBIC AND ANAEROBIC Blood Culture adequate volume   Culture   Final    NO GROWTH 1 DAY Performed at [facility], [place] [address], [place], [place] [address]    Report Status PENDING  Incomplete     Time coordinating discharge: 42 minutes  SIGNED:   Lady Deutscher, MD  FACP [facility] 01/27/2018, 3:28 PM Pager   If 7PM-7AM, please contact night-coverage [email or web address] Password [email or web address]

## 2018-01-27 NOTE — Care Management Note (Signed)
Case Management Note  Patient Details  Name: BRYSON GAVIA MRN: [id number] Date of Birth: Jul 21, 1950  Subjective/Objective:  Pt presented for Chest Pain- plan for cath 01-27-18. PTA Independent from home. No home needs identified.                  Action/Plan: No further needs from CM at this time.   Expected Discharge Date:                  Expected Discharge Plan:  Home/Self Care  In-House Referral:  NA  Discharge planning Services  CM Consult  Post Acute Care Choice:  NA Choice offered to:  NA  DME Arranged:  N/A DME Agency:  NA  HH Arranged:  NA HH Agency:  NA  Status of Service:  Completed, signed off  If discussed at [place] of Stay Meetings, dates discussed:    Additional Comments:  Bethena Roys, RN 01/27/2018, 10:46 AM

## 2018-01-27 NOTE — Interval H&P Note (Signed)
Cath Lab Visit (complete for each Cath Lab visit)  Clinical Evaluation Leading to the Procedure:   ACS: Yes.    Non-ACS:    Anginal Classification: CCS IV  Anti-ischemic medical therapy: Minimal Therapy (1 class of medications)  Non-Invasive Test Results: No non-invasive testing performed  Prior CABG: No previous CABG      History and Physical Interval Note:  01/27/2018 2:09 PM  Johnny [name]  has presented today for surgery, with the diagnosis of unstable angina  The various methods of treatment have been discussed with the patient and family. After consideration of risks, benefits and other options for treatment, the patient has consented to  Procedure(s): LEFT HEART CATH AND CORONARY ANGIOGRAPHY (N/A) as a surgical intervention .  The patient's history has been reviewed, patient examined, no change in status, stable for surgery.  I have reviewed the patient's chart and labs.  Questions were answered to the patient's satisfaction.     Larae Grooms

## 2018-01-27 NOTE — H&P (View-Only) (Signed)
Progress Note  Patient Name: Johnny [name] Date of Encounter: 01/27/2018  Primary Cardiologist: [name], MD   Subjective   Some mild pressure when trying to take a deep breath.  Inpatient Medications    Scheduled Meds: . aspirin EC  81 mg Oral Daily  . clopidogrel  75 mg Oral Daily  . cycloSPORINE  1 drop Both Eyes BID  . enoxaparin (LOVENOX) injection  40 mg Subcutaneous QHS  . ezetimibe  10 mg Oral Daily  . magnesium oxide  200 mg Oral BID  . nebivolol  2.5 mg Oral Daily  . ranolazine  500 mg Oral BID  . sodium chloride flush  3 mL Intravenous Q12H  . vitamin B-12  500 mcg Oral Daily   Continuous Infusions: . sodium chloride 75 mL/hr at 01/26/18 1642  . sodium chloride    . sodium chloride 1 mL/kg/hr (01/27/18 0652)   PRN Meds: sodium chloride, acetaminophen, ALPRAZolam, hydrALAZINE, HYDROmorphone (DILAUDID) injection, LORazepam, nitroGLYCERIN, ondansetron (ZOFRAN) IV, oxyCODONE-acetaminophen, sodium chloride flush, zolpidem   Vital Signs    Vitals:   01/26/18 1622 01/26/18 1952 01/26/18 2205 01/27/18 0452  BP: 108/71 134/86 135/90 120/85  Pulse: 63 70  (!) 58  Resp:  19 (!) 22 14  Temp: 98.2 F (36.8 C) 98.4 F (36.9 C)  98.3 F (36.8 C)  TempSrc: Oral Oral  Oral  SpO2: 97% 98%  96%  Weight:    95.9 kg  Height:        Intake/Output Summary (Last 24 hours) at 01/27/2018 0849 Last data filed at 01/26/2018 2209 Gross per 24 hour  Intake 723 ml  Output 800 ml  Net -77 ml   Filed Weights   01/25/18 1939 01/25/18 2349 01/27/18 0452  Weight: 97.1 kg 98.9 kg 95.9 kg    Telemetry    NSR - Personally Reviewed  ECG    NSR, no ST changes - Personally Reviewed  Physical Exam   GEN: No acute distress.   Neck: No JVD Cardiac: RRR, no murmurs, rubs, or gallops.  Respiratory: Clear to auscultation bilaterally. GI: Soft, nontender, non-distended  MS: No edema; No deformity. Neuro:  Nonfocal  Psych: Normal affect   Labs    Chemistry Recent  Labs  Lab 01/25/18 1941 01/27/18 0444  NA 139 139  K 4.2 3.9  CL 105 109  CO2 26 24  GLUCOSE 94 95  BUN 13 14  CREATININE 1.22 1.19  CALCIUM 9.7 8.9  GFRNONAA >60 >60  GFRAA >60 >60  ANIONGAP 8 6     Hematology Recent Labs  Lab 01/25/18 1941 01/26/18 0945 01/27/18 0444  WBC 14.5* 10.4 9.2  RBC 3.90* 4.02* 3.81*  HGB 12.9* 13.2 12.3*  HCT 38.9* 40.2 38.0*  MCV 99.7 100.0 99.7  MCH 33.1 32.8 32.3  MCHC 33.2 32.8 32.4  RDW 12.4 12.3 12.2  PLT 200 194 167    Cardiac Enzymes Recent Labs  Lab 01/25/18 2321 01/26/18 0457 01/26/18 0945  TROPONINI <0.03 <0.03 <0.03    Recent Labs  Lab 01/25/18 1953  TROPIPOC 0.01     BNPNo results for input(s): BNP, PROBNP in the last 168 hours.   DDimer  Recent Labs  Lab 01/25/18 2321  DDIMER <0.27     Radiology    Dg Chest 2 View  Result Date: 01/25/2018 CLINICAL DATA:  Chest pain EXAM: CHEST - 2 VIEW COMPARISON:  12/30/2015 chest radiograph. FINDINGS: Stable cardiomediastinal silhouette with normal heart size. No pneumothorax. No pleural effusion. No  pulmonary edema. Mild bibasilar scarring versus atelectasis. IMPRESSION: Mild bibasilar scarring versus atelectasis. Otherwise no active disease. Electronically Signed   By: Ilona Sorrel M.D.   On: 01/25/2018 20:24   Ct Chest Wo Contrast  Result Date: 01/26/2018 CLINICAL DATA:  Worsening hypoxemia. Upper chest pain and shortness of breath for several days. EXAM: CT CHEST WITHOUT CONTRAST TECHNIQUE: Multidetector CT imaging of the chest was performed following the standard protocol without IV contrast. COMPARISON:  Chest x-ray January 25, 2018 and chest CT October 2017 FINDINGS: Cardiovascular: Coronary artery calcifications seen in the left coronary arteries. The thoracic aorta is nonaneurysmal. The main pulmonary artery is normal in caliber. The heart is normal in size. Mediastinum/Nodes: No enlarged mediastinal or axillary lymph nodes. Thyroid gland, trachea, and esophagus  demonstrate no significant findings. Lungs/Pleura: Central airways are normal. No pneumothorax. Emphysematous changes in the lungs. Platelike opacities in the right middle lobe, lingula, and bases is all most likely worsening atelectasis. The opacity is most prominent in the right base and a superimposed developing infiltrate/pneumonia is not completely excluded. 4.5 mm nodule in the left base on series 4, image 90. 3 mm nodule in the right base on series 4, image 66. A calcified granuloma seen in the right lung on image 79. No other nodules. No suspicious masses. Upper Abdomen: Stones are seen in the right kidney with no obstruction. An exophytic mass off the left kidney on series 3, image 156 measures up to 2.1 cm with an attenuation of greater than 20 Hounsfield units. No other abnormalities. Musculoskeletal: Loss of height of an upper thoracic vertebral body on sagittal image 91, stable since the comparison chest CT. IMPRESSION: 1. Opacities in the right middle lobe, lingula, and bases are primarily atelectasis. The opacity is most prominent in the right base and a superimposed developing infiltrate is not completely excluded. However, I favor the findings are all most likely worsening atelectasis. Recommend clinical correlation and attention on follow-up. 2. There is an exophytic mass measuring 2.1 cm off the left kidney with an attenuation of 21 are 22 Hounsfield units. This mass may simply be a complicated cyst but is indeterminate on this study. Recommend an ultrasound in an attempt to confirm the cystic nature of this mass. 3. Coronary artery calcifications. 4. Emphysematous changes in the lungs. 5. 4.5 mm nodule in the left base and 3 mm nodule in the right base. No follow-up needed if patient is low-risk. Non-contrast chest CT can be considered in 12 months if patient is high-risk. This recommendation follows the consensus statement: Guidelines for Management of Incidental Pulmonary Nodules Detected on CT  Images: From the Fleischner Society 2017; Radiology 2017; 284:228-243. 6. Right nephrolithiasis with no obstruction. Emphysema (ICD10-J43.9). Electronically Signed   By: Dorise Bullion III M.D   On: 01/26/2018 09:28    Cardiac Studies   Prior cath with moderate disease-medically managed  Patient Profile     67 y.o. male with some exertional chest pains, and h/o CAD  Assessment & Plan    Plan for cardiac cath today.  All questions about the procedure were answered.  He did have a radial heart cath in the past.  He recalls feeling some sensation of the catheter moving through his radial artery.  He does not describe severe pain.  We discussed the femoral option as well.  He will think about what he would prefer.  He does want to be sedated.     For questions or updates, please contact [place] Please  consult [email or web address] for contact info under        Signed, Larae Grooms, MD  01/27/2018, 8:49 AM

## 2018-01-27 NOTE — Care Management Obs Status (Signed)
[facility] [place] NOTIFICATION   Patient Details  Name: Johnny [name] MRN: [id number] Date of Birth: 07-Aug-1950   Medicare Observation Status Notification Given:  Yes    Bethena Roys, RN 01/27/2018, 10:10 AM

## 2018-01-27 NOTE — Progress Notes (Signed)
Progress Note  Patient Name: Johnny [name] Date of Encounter: 01/27/2018  Primary Cardiologist: [name], MD   Subjective   Some mild pressure when trying to take a deep breath.  Inpatient Medications    Scheduled Meds: . aspirin EC  81 mg Oral Daily  . clopidogrel  75 mg Oral Daily  . cycloSPORINE  1 drop Both Eyes BID  . enoxaparin (LOVENOX) injection  40 mg Subcutaneous QHS  . ezetimibe  10 mg Oral Daily  . magnesium oxide  200 mg Oral BID  . nebivolol  2.5 mg Oral Daily  . ranolazine  500 mg Oral BID  . sodium chloride flush  3 mL Intravenous Q12H  . vitamin B-12  500 mcg Oral Daily   Continuous Infusions: . sodium chloride 75 mL/hr at 01/26/18 1642  . sodium chloride    . sodium chloride 1 mL/kg/hr (01/27/18 0652)   PRN Meds: sodium chloride, acetaminophen, ALPRAZolam, hydrALAZINE, HYDROmorphone (DILAUDID) injection, LORazepam, nitroGLYCERIN, ondansetron (ZOFRAN) IV, oxyCODONE-acetaminophen, sodium chloride flush, zolpidem   Vital Signs    Vitals:   01/26/18 1622 01/26/18 1952 01/26/18 2205 01/27/18 0452  BP: 108/71 134/86 135/90 120/85  Pulse: 63 70  (!) 58  Resp:  19 (!) 22 14  Temp: 98.2 F (36.8 C) 98.4 F (36.9 C)  98.3 F (36.8 C)  TempSrc: Oral Oral  Oral  SpO2: 97% 98%  96%  Weight:    95.9 kg  Height:        Intake/Output Summary (Last 24 hours) at 01/27/2018 0849 Last data filed at 01/26/2018 2209 Gross per 24 hour  Intake 723 ml  Output 800 ml  Net -77 ml   Filed Weights   01/25/18 1939 01/25/18 2349 01/27/18 0452  Weight: 97.1 kg 98.9 kg 95.9 kg    Telemetry    NSR - Personally Reviewed  ECG    NSR, no ST changes - Personally Reviewed  Physical Exam   GEN: No acute distress.   Neck: No JVD Cardiac: RRR, no murmurs, rubs, or gallops.  Respiratory: Clear to auscultation bilaterally. GI: Soft, nontender, non-distended  MS: No edema; No deformity. Neuro:  Nonfocal  Psych: Normal affect   Labs    Chemistry Recent  Labs  Lab 01/25/18 1941 01/27/18 0444  NA 139 139  K 4.2 3.9  CL 105 109  CO2 26 24  GLUCOSE 94 95  BUN 13 14  CREATININE 1.22 1.19  CALCIUM 9.7 8.9  GFRNONAA >60 >60  GFRAA >60 >60  ANIONGAP 8 6     Hematology Recent Labs  Lab 01/25/18 1941 01/26/18 0945 01/27/18 0444  WBC 14.5* 10.4 9.2  RBC 3.90* 4.02* 3.81*  HGB 12.9* 13.2 12.3*  HCT 38.9* 40.2 38.0*  MCV 99.7 100.0 99.7  MCH 33.1 32.8 32.3  MCHC 33.2 32.8 32.4  RDW 12.4 12.3 12.2  PLT 200 194 167    Cardiac Enzymes Recent Labs  Lab 01/25/18 2321 01/26/18 0457 01/26/18 0945  TROPONINI <0.03 <0.03 <0.03    Recent Labs  Lab 01/25/18 1953  TROPIPOC 0.01     BNPNo results for input(s): BNP, PROBNP in the last 168 hours.   DDimer  Recent Labs  Lab 01/25/18 2321  DDIMER <0.27     Radiology    Dg Chest 2 View  Result Date: 01/25/2018 CLINICAL DATA:  Chest pain EXAM: CHEST - 2 VIEW COMPARISON:  12/30/2015 chest radiograph. FINDINGS: Stable cardiomediastinal silhouette with normal heart size. No pneumothorax. No pleural effusion. No  pulmonary edema. Mild bibasilar scarring versus atelectasis. IMPRESSION: Mild bibasilar scarring versus atelectasis. Otherwise no active disease. Electronically Signed   By: Ilona Sorrel M.D.   On: 01/25/2018 20:24   Ct Chest Wo Contrast  Result Date: 01/26/2018 CLINICAL DATA:  Worsening hypoxemia. Upper chest pain and shortness of breath for several days. EXAM: CT CHEST WITHOUT CONTRAST TECHNIQUE: Multidetector CT imaging of the chest was performed following the standard protocol without IV contrast. COMPARISON:  Chest x-ray January 25, 2018 and chest CT October 2017 FINDINGS: Cardiovascular: Coronary artery calcifications seen in the left coronary arteries. The thoracic aorta is nonaneurysmal. The main pulmonary artery is normal in caliber. The heart is normal in size. Mediastinum/Nodes: No enlarged mediastinal or axillary lymph nodes. Thyroid gland, trachea, and esophagus  demonstrate no significant findings. Lungs/Pleura: Central airways are normal. No pneumothorax. Emphysematous changes in the lungs. Platelike opacities in the right middle lobe, lingula, and bases is all most likely worsening atelectasis. The opacity is most prominent in the right base and a superimposed developing infiltrate/pneumonia is not completely excluded. 4.5 mm nodule in the left base on series 4, image 90. 3 mm nodule in the right base on series 4, image 66. A calcified granuloma seen in the right lung on image 79. No other nodules. No suspicious masses. Upper Abdomen: Stones are seen in the right kidney with no obstruction. An exophytic mass off the left kidney on series 3, image 156 measures up to 2.1 cm with an attenuation of greater than 20 Hounsfield units. No other abnormalities. Musculoskeletal: Loss of height of an upper thoracic vertebral body on sagittal image 91, stable since the comparison chest CT. IMPRESSION: 1. Opacities in the right middle lobe, lingula, and bases are primarily atelectasis. The opacity is most prominent in the right base and a superimposed developing infiltrate is not completely excluded. However, I favor the findings are all most likely worsening atelectasis. Recommend clinical correlation and attention on follow-up. 2. There is an exophytic mass measuring 2.1 cm off the left kidney with an attenuation of 21 are 22 Hounsfield units. This mass may simply be a complicated cyst but is indeterminate on this study. Recommend an ultrasound in an attempt to confirm the cystic nature of this mass. 3. Coronary artery calcifications. 4. Emphysematous changes in the lungs. 5. 4.5 mm nodule in the left base and 3 mm nodule in the right base. No follow-up needed if patient is low-risk. Non-contrast chest CT can be considered in 12 months if patient is high-risk. This recommendation follows the consensus statement: Guidelines for Management of Incidental Pulmonary Nodules Detected on CT  Images: From the Fleischner Society 2017; Radiology 2017; 284:228-243. 6. Right nephrolithiasis with no obstruction. Emphysema (ICD10-J43.9). Electronically Signed   By: Dorise Bullion III M.D   On: 01/26/2018 09:28    Cardiac Studies   Prior cath with moderate disease-medically managed  Patient Profile     67 y.o. male with some exertional chest pains, and h/o CAD  Assessment & Plan    Plan for cardiac cath today.  All questions about the procedure were answered.  He did have a radial heart cath in the past.  He recalls feeling some sensation of the catheter moving through his radial artery.  He does not describe severe pain.  We discussed the femoral option as well.  He will think about what he would prefer.  He does want to be sedated.     For questions or updates, please contact [facility] Please  consult [email or web address] for contact info under        Signed, Larae Grooms, MD  01/27/2018, 8:49 AM

## 2018-01-28 NOTE — Telephone Encounter (Signed)
Attempted to call the pt back to place a TCM call, since discharge from the hospital yesterday.  Pt did not answer and has no VM set-up.

## 2018-01-29 NOTE — Telephone Encounter (Signed)
Patient contacted regarding discharge from [facility] on 01/27/18.  Patient understands to follow up with provider Truitt Merle, NP on 02/11/18 at 10 am at [facility] in [place]. Patient understands discharge instructions? Yes Patient understands medications and regiment? Yes Patient understands to bring all medications to this visit? Yes

## 2018-01-31 LAB — CULTURE, BLOOD (ROUTINE X 2)
CULTURE: NO GROWTH
Culture: NO GROWTH
SPECIAL REQUESTS: ADEQUATE
SPECIAL REQUESTS: ADEQUATE

## 2018-02-03 ENCOUNTER — Ambulatory Visit: Payer: Medicare Other | Admitting: Internal Medicine

## 2018-02-05 ENCOUNTER — Ambulatory Visit: Payer: Medicare Other | Admitting: Internal Medicine

## 2018-02-05 ENCOUNTER — Encounter: Payer: Self-pay | Admitting: Internal Medicine

## 2018-02-05 VITALS — BP 120/70 | HR 55 | Temp 98.3°F | Ht 72.0 in | Wt 214.0 lb

## 2018-02-05 DIAGNOSIS — I519 Heart disease, unspecified: Secondary | ICD-10-CM

## 2018-02-05 DIAGNOSIS — N2889 Other specified disorders of kidney and ureter: Secondary | ICD-10-CM

## 2018-02-05 DIAGNOSIS — E781 Pure hyperglyceridemia: Secondary | ICD-10-CM

## 2018-02-05 DIAGNOSIS — I10 Essential (primary) hypertension: Secondary | ICD-10-CM

## 2018-02-05 DIAGNOSIS — I252 Old myocardial infarction: Secondary | ICD-10-CM

## 2018-02-05 MED ORDER — ROSUVASTATIN CALCIUM 5 MG PO TABS: 5.0000 mg | ORAL_TABLET | Freq: Every day | ORAL | 2 refills | Status: DC
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
## 2018-02-05 NOTE — Patient Instructions (Addendum)
Try Crestor 5 mg daily.  Discontinue Zetia.  We will follow-up in December at time of physical exam with lipid panel and other fasting labs.  He will return Friday, September 20 for CBC and be met status post recent cardiac catheterization.  He will have ultrasound of left kidney to rule out mass as abnormality was noted on CT scan recently.   

## 2018-02-06 ENCOUNTER — Other Ambulatory Visit: Payer: Medicare Other | Admitting: Internal Medicine

## 2018-02-06 ENCOUNTER — Other Ambulatory Visit: Payer: Self-pay | Admitting: Internal Medicine

## 2018-02-06 DIAGNOSIS — I519 Heart disease, unspecified: Secondary | ICD-10-CM

## 2018-02-06 DIAGNOSIS — N2889 Other specified disorders of kidney and ureter: Secondary | ICD-10-CM

## 2018-02-06 DIAGNOSIS — Z9889 Other specified postprocedural states: Secondary | ICD-10-CM

## 2018-02-07 ENCOUNTER — Other Ambulatory Visit: Payer: Medicare Other | Admitting: Internal Medicine

## 2018-02-07 DIAGNOSIS — I519 Heart disease, unspecified: Secondary | ICD-10-CM

## 2018-02-07 DIAGNOSIS — N2889 Other specified disorders of kidney and ureter: Secondary | ICD-10-CM

## 2018-02-07 DIAGNOSIS — Z9889 Other specified postprocedural states: Secondary | ICD-10-CM

## 2018-02-07 LAB — CBC WITH DIFFERENTIAL/PLATELET
BASOS ABS: 54 {cells}/uL (ref 0–200)
Basophils Relative: 0.8 %
EOS PCT: 1.5 %
Eosinophils Absolute: 101 cells/uL (ref 15–500)
HCT: 40.8 % (ref 38.5–50.0)
Hemoglobin: 14 g/dL (ref 13.2–17.1)
Lymphs Abs: 1849 cells/uL (ref 850–3900)
MCH: 32.6 pg (ref 27.0–33.0)
MCHC: 34.3 g/dL (ref 32.0–36.0)
MCV: 95.1 fL (ref 80.0–100.0)
MPV: 9.5 fL (ref 7.5–12.5)
Monocytes Relative: 10.5 %
NEUTROS PCT: 59.6 %
Neutro Abs: 3993 cells/uL (ref 1500–7800)
PLATELETS: 259 10*3/uL (ref 140–400)
RBC: 4.29 10*6/uL (ref 4.20–5.80)
RDW: 12.2 % (ref 11.0–15.0)
Total Lymphocyte: 27.6 %
WBC mixed population: 704 cells/uL (ref 200–950)
WBC: 6.7 10*3/uL (ref 3.8–10.8)

## 2018-02-07 LAB — BASIC METABOLIC PANEL
BUN: 16 mg/dL (ref 7–25)
CALCIUM: 10.4 mg/dL — AB (ref 8.6–10.3)
CO2: 29 mmol/L (ref 20–32)
Chloride: 105 mmol/L (ref 98–110)
Creat: 1.19 mg/dL (ref 0.70–1.25)
Glucose, Bld: 88 mg/dL (ref 65–99)
Potassium: 4.5 mmol/L (ref 3.5–5.3)
Sodium: 139 mmol/L (ref 135–146)

## 2018-02-11 ENCOUNTER — Encounter: Payer: Self-pay | Admitting: Nurse Practitioner

## 2018-02-11 ENCOUNTER — Ambulatory Visit (INDEPENDENT_AMBULATORY_CARE_PROVIDER_SITE_OTHER): Payer: Medicare Other | Admitting: Nurse Practitioner

## 2018-02-11 VITALS — BP 146/90 | HR 60 | Ht 72.0 in | Wt 213.1 lb

## 2018-02-11 DIAGNOSIS — I10 Essential (primary) hypertension: Secondary | ICD-10-CM

## 2018-02-11 DIAGNOSIS — E7849 Other hyperlipidemia: Secondary | ICD-10-CM | POA: Diagnosis not present

## 2018-02-11 DIAGNOSIS — I259 Chronic ischemic heart disease, unspecified: Secondary | ICD-10-CM

## 2018-02-11 DIAGNOSIS — Z9889 Other specified postprocedural states: Secondary | ICD-10-CM

## 2018-02-11 MED ORDER — EZETIMIBE 10 MG PO TABS: 10.0000 mg | ORAL_TABLET | Freq: Every day | ORAL | 3 refills | Status: DC

## 2018-02-11 MED ORDER — CLOPIDOGREL BISULFATE 75 MG PO TABS: 75.0000 mg | ORAL_TABLET | Freq: Every day | ORAL | 3 refills | Status: DC

## 2018-02-11 MED ORDER — NEBIVOLOL HCL 2.5 MG PO TABS: 2.5000 mg | ORAL_TABLET | Freq: Every day | ORAL | 3 refills | Status: DC

## 2018-02-11 MED ORDER — RANOLAZINE ER 500 MG PO TB12: 500.0000 mg | ORAL_TABLET | Freq: Two times a day (BID) | ORAL | 3 refills | Status: DC
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
## 2018-02-11 NOTE — Patient Instructions (Addendum)
We will be checking the following labs today - NONE  Fasting lab in 6 weeks - lipids and LFTs   Medication Instructions:    Continue with your current medicines. BUT  I would try to take the Zetia and the Crestor - we will recheck your labs if able to tolerate in 6 weeks  I have sent in your refills today    Testing/Procedures To Be Arranged:  N/A  Follow-Up:   See Dr. [name] as planned in December.     Other Special Instructions:   N/A    If you need a refill on your cardiac medications before your next appointment, please call your pharmacy.   Call the [facility] office at [phone] if you have any questions, problems or concerns.

## 2018-02-12 ENCOUNTER — Ambulatory Visit
Admission: RE | Admit: 2018-02-12 | Discharge: 2018-02-12 | Disposition: A | Discharge status: Home / Self Care | Payer: Medicare Other | Source: Ambulatory Visit | Attending: Internal Medicine | Admitting: Internal Medicine

## 2018-02-28 ENCOUNTER — Encounter: Payer: Commercial Managed Care - HMO | Admitting: Internal Medicine

## 2018-03-01 ENCOUNTER — Other Ambulatory Visit: Payer: Self-pay | Admitting: Internal Medicine

## 2018-03-19 ENCOUNTER — Other Ambulatory Visit: Payer: Medicare Other

## 2018-03-27 ENCOUNTER — Ambulatory Visit (INDEPENDENT_AMBULATORY_CARE_PROVIDER_SITE_OTHER): Payer: Medicare Other | Admitting: Family Medicine

## 2018-03-27 ENCOUNTER — Other Ambulatory Visit: Payer: Self-pay

## 2018-03-27 ENCOUNTER — Encounter: Payer: Self-pay | Admitting: Family Medicine

## 2018-03-27 VITALS — BP 128/72 | HR 55 | Temp 98.0°F | Resp 16 | Ht 72.44 in | Wt 209.0 lb

## 2018-03-27 DIAGNOSIS — Z024 Encounter for examination for driving license: Secondary | ICD-10-CM

## 2018-03-27 NOTE — Patient Instructions (Signed)
° ° ° °  If you have lab work done today you will be contacted with your lab results within the next 2 weeks.  If you have not heard from us then please contact us. The fastest way to get your results is to register for My Chart. ° ° °IF you received an x-ray today, you will receive an invoice from [facility]. Please contact [facility] at [phone] with questions or concerns regarding your invoice.  ° °IF you received labwork today, you will receive an invoice from [facility]. Please contact [facility] at [phone] with questions or concerns regarding your invoice.  ° °Our billing staff will not be able to assist you with questions regarding bills from these companies. ° °You will be contacted with the lab results as soon as they are available. The fastest way to get your results is to activate your My Chart account. Instructions are located on the last page of this paperwork. If you have not heard from us regarding the results in 2 weeks, please contact this office. °  ° ° ° °
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
## 2018-04-01 ENCOUNTER — Encounter: Payer: Self-pay | Admitting: Cardiovascular Disease

## 2018-04-21 ENCOUNTER — Other Ambulatory Visit: Payer: Self-pay | Admitting: Internal Medicine

## 2018-04-21 DIAGNOSIS — I519 Heart disease, unspecified: Secondary | ICD-10-CM

## 2018-04-21 DIAGNOSIS — N2889 Other specified disorders of kidney and ureter: Secondary | ICD-10-CM

## 2018-04-21 DIAGNOSIS — Z125 Encounter for screening for malignant neoplasm of prostate: Secondary | ICD-10-CM

## 2018-04-21 DIAGNOSIS — I10 Essential (primary) hypertension: Secondary | ICD-10-CM

## 2018-04-21 DIAGNOSIS — E782 Mixed hyperlipidemia: Secondary | ICD-10-CM

## 2018-04-24 ENCOUNTER — Other Ambulatory Visit: Payer: Medicare Other | Admitting: Internal Medicine

## 2018-04-24 DIAGNOSIS — I519 Heart disease, unspecified: Secondary | ICD-10-CM

## 2018-04-24 DIAGNOSIS — I10 Essential (primary) hypertension: Secondary | ICD-10-CM

## 2018-04-24 DIAGNOSIS — Z125 Encounter for screening for malignant neoplasm of prostate: Secondary | ICD-10-CM

## 2018-04-24 DIAGNOSIS — N2889 Other specified disorders of kidney and ureter: Secondary | ICD-10-CM

## 2018-04-24 DIAGNOSIS — E782 Mixed hyperlipidemia: Secondary | ICD-10-CM

## 2018-04-25 LAB — COMPLETE METABOLIC PANEL WITH GFR
AG Ratio: 1.6 (calc) (ref 1.0–2.5)
ALT: 12 U/L (ref 9–46)
AST: 12 U/L (ref 10–35)
Albumin: 4.5 g/dL (ref 3.6–5.1)
Alkaline phosphatase (APISO): 51 U/L (ref 40–115)
BUN/Creatinine Ratio: 11 (calc) (ref 6–22)
BUN: 14 mg/dL (ref 7–25)
CO2: 28 mmol/L (ref 20–32)
CREATININE: 1.28 mg/dL — AB (ref 0.70–1.25)
Calcium: 10.7 mg/dL — ABNORMAL HIGH (ref 8.6–10.3)
Chloride: 104 mmol/L (ref 98–110)
GFR, Est African American: 68 mL/min/{1.73_m2} (ref >=60)
GFR, Est Non African American: 58 mL/min/{1.73_m2} — ABNORMAL LOW (ref >=60)
GLOBULIN: 2.8 g/dL (ref 1.9–3.7)
Glucose, Bld: 84 mg/dL (ref 65–99)
Potassium: 5 mmol/L (ref 3.5–5.3)
Sodium: 139 mmol/L (ref 135–146)
Total Bilirubin: 0.9 mg/dL (ref 0.2–1.2)
Total Protein: 7.3 g/dL (ref 6.1–8.1)

## 2018-04-25 LAB — CBC WITH DIFFERENTIAL/PLATELET
Basophils Absolute: 57 cells/uL (ref 0–200)
Basophils Relative: 0.5 %
Eosinophils Absolute: 102 cells/uL (ref 15–500)
Eosinophils Relative: 0.9 %
HCT: 41.1 % (ref 38.5–50.0)
Hemoglobin: 14.4 g/dL (ref 13.2–17.1)
Lymphs Abs: 1910 cells/uL (ref 850–3900)
MCH: 32.1 pg (ref 27.0–33.0)
MCHC: 35 g/dL (ref 32.0–36.0)
MCV: 91.5 fL (ref 80.0–100.0)
MPV: 9.8 fL (ref 7.5–12.5)
Monocytes Relative: 8.4 %
Neutro Abs: 8283 cells/uL — ABNORMAL HIGH (ref 1500–7800)
Neutrophils Relative %: 73.3 %
Platelets: 240 10*3/uL (ref 140–400)
RBC: 4.49 10*6/uL (ref 4.20–5.80)
RDW: 12.4 % (ref 11.0–15.0)
Total Lymphocyte: 16.9 %
WBC mixed population: 949 cells/uL (ref 200–950)
WBC: 11.3 10*3/uL — ABNORMAL HIGH (ref 3.8–10.8)

## 2018-04-25 LAB — LIPID PANEL
Cholesterol: 197 mg/dL (ref <200)
HDL: 39 mg/dL — ABNORMAL LOW (ref >40)
LDL Cholesterol (Calc): 121 mg/dL (calc) — ABNORMAL HIGH
Non-HDL Cholesterol (Calc): 158 mg/dL (calc) — ABNORMAL HIGH (ref <130)
Total CHOL/HDL Ratio: 5.1 (calc) — ABNORMAL HIGH (ref <5.0)
Triglycerides: 258 mg/dL — ABNORMAL HIGH (ref <150)

## 2018-04-25 LAB — PSA: PSA: 0.7 ng/mL (ref <=4.0)

## 2018-04-28 ENCOUNTER — Ambulatory Visit (INDEPENDENT_AMBULATORY_CARE_PROVIDER_SITE_OTHER): Payer: Medicare Other | Admitting: Internal Medicine

## 2018-04-28 ENCOUNTER — Encounter: Payer: Self-pay | Admitting: Internal Medicine

## 2018-04-28 VITALS — BP 128/80 | HR 64 | Ht 72.0 in | Wt 212.0 lb

## 2018-04-28 DIAGNOSIS — I252 Old myocardial infarction: Secondary | ICD-10-CM | POA: Diagnosis not present

## 2018-04-28 DIAGNOSIS — Z86010 Personal history of colonic polyps: Secondary | ICD-10-CM

## 2018-04-28 DIAGNOSIS — E8881 Metabolic syndrome: Secondary | ICD-10-CM

## 2018-04-28 DIAGNOSIS — E782 Mixed hyperlipidemia: Secondary | ICD-10-CM | POA: Diagnosis not present

## 2018-04-28 DIAGNOSIS — Z0000 Encounter for general adult medical examination without abnormal findings: Secondary | ICD-10-CM | POA: Diagnosis not present

## 2018-04-28 DIAGNOSIS — I10 Essential (primary) hypertension: Secondary | ICD-10-CM | POA: Diagnosis not present

## 2018-04-28 DIAGNOSIS — Z87442 Personal history of urinary calculi: Secondary | ICD-10-CM | POA: Diagnosis not present

## 2018-04-28 DIAGNOSIS — K2100 Gastro-esophageal reflux disease with esophagitis, without bleeding: Secondary | ICD-10-CM

## 2018-04-28 DIAGNOSIS — M8580 Other specified disorders of bone density and structure, unspecified site: Secondary | ICD-10-CM

## 2018-04-28 DIAGNOSIS — R911 Solitary pulmonary nodule: Secondary | ICD-10-CM

## 2018-04-28 DIAGNOSIS — Z8639 Personal history of other endocrine, nutritional and metabolic disease: Secondary | ICD-10-CM

## 2018-04-28 DIAGNOSIS — K648 Other hemorrhoids: Secondary | ICD-10-CM

## 2018-04-28 DIAGNOSIS — J22 Unspecified acute lower respiratory infection: Secondary | ICD-10-CM

## 2018-04-28 LAB — POCT URINALYSIS DIPSTICK
Appearance: NEGATIVE
Bilirubin, UA: NEGATIVE
Blood, UA: NEGATIVE
GLUCOSE UA: NEGATIVE
Ketones, UA: NEGATIVE
Leukocytes, UA: NEGATIVE
Nitrite, UA: NEGATIVE
Odor: NEGATIVE
Protein, UA: NEGATIVE
Spec Grav, UA: 1.01 (ref 1.010–1.025)
Urobilinogen, UA: 0.2 E.U./dL
pH, UA: 7 (ref 5.0–8.0)

## 2018-04-28 MED ORDER — DOXYCYCLINE HYCLATE 100 MG PO TABS: 100.0000 mg | ORAL_TABLET | Freq: Two times a day (BID) | ORAL | 0 refills | Status: DC
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
## 2018-05-16 ENCOUNTER — Ambulatory Visit: Payer: Medicare Other | Admitting: Cardiovascular Disease

## 2018-05-20 NOTE — Patient Instructions (Signed)
Please restart lipid-lowering medication.  Watch diet and get exercise.  Needs follow-up in 6 months.  Doxycycline for respiratory infection.  Continue follow-up with cardiology.

## 2018-12-09 ENCOUNTER — Encounter: Payer: Self-pay | Admitting: Pharmacist

## 2018-12-27 ENCOUNTER — Other Ambulatory Visit: Payer: Self-pay | Admitting: Nurse Practitioner

## 2018-12-27 DIAGNOSIS — Z9889 Other specified postprocedural states: Secondary | ICD-10-CM

## 2018-12-27 DIAGNOSIS — I259 Chronic ischemic heart disease, unspecified: Secondary | ICD-10-CM

## 2018-12-27 DIAGNOSIS — I10 Essential (primary) hypertension: Secondary | ICD-10-CM

## 2018-12-27 DIAGNOSIS — E7849 Other hyperlipidemia: Secondary | ICD-10-CM

## 2019-02-16 ENCOUNTER — Ambulatory Visit (INDEPENDENT_AMBULATORY_CARE_PROVIDER_SITE_OTHER): Payer: Medicare Other | Admitting: Cardiology

## 2019-02-16 ENCOUNTER — Encounter: Payer: Self-pay | Admitting: Cardiology

## 2019-02-16 ENCOUNTER — Other Ambulatory Visit: Payer: Self-pay

## 2019-02-16 VITALS — BP 140/78 | HR 68 | Ht 72.0 in | Wt 218.2 lb

## 2019-02-16 DIAGNOSIS — E7800 Pure hypercholesterolemia, unspecified: Secondary | ICD-10-CM

## 2019-02-16 DIAGNOSIS — I2510 Atherosclerotic heart disease of native coronary artery without angina pectoris: Secondary | ICD-10-CM | POA: Diagnosis not present

## 2019-02-16 DIAGNOSIS — I10 Essential (primary) hypertension: Secondary | ICD-10-CM

## 2019-02-16 DIAGNOSIS — I259 Chronic ischemic heart disease, unspecified: Secondary | ICD-10-CM

## 2019-02-16 NOTE — Patient Instructions (Addendum)
Medication Instructions:   Your physician recommends that you continue on your current medications as directed. Please refer to the Current Medication list given to you today.  If you need a refill on your cardiac medications before your next appointment, please call your pharmacy.   Lab work:  Your physician recommends that you return for lab work tomorrow 02/17/19 for lipid and hepatic panel.  If you have labs (blood work) drawn today and your tests are completely normal, you will receive your results only by: Marland Kitchen MyChart Message (if you have MyChart) OR . A paper copy in the mail If you have any lab test that is abnormal or we need to change your treatment, we will call you to review the results.  Testing/Procedures:  None ordered today  Follow-Up: At [facility], you and your health needs are our priority.  As part of our continuing mission to provide you with exceptional heart care, we have created designated Provider Care Teams.  These Care Teams include your primary Cardiologist (physician) and Advanced Practice Providers (APPs -  Physician Assistants and Nurse Practitioners) who all work together to provide you with the care you need, when you need it. You will need a follow up appointment in 6 months.  Please call our office 2 months in advance to schedule this appointment.  You may see [name], MD or one of the following Advanced Practice Providers on your designated Care Team:   Truitt Merle, NP Cecilie Kicks, NP . Kathyrn Drown, NP
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
## 2019-02-17 ENCOUNTER — Other Ambulatory Visit: Payer: Medicare Other

## 2019-02-19 ENCOUNTER — Encounter (HOSPITAL_COMMUNITY): Payer: Self-pay | Admitting: Emergency Medicine

## 2019-02-19 ENCOUNTER — Emergency Department (HOSPITAL_COMMUNITY): Payer: Medicare Other

## 2019-02-19 ENCOUNTER — Emergency Department (HOSPITAL_COMMUNITY)
Admission: EM | Admit: 2019-02-19 | Discharge: 2019-02-20 | Discharge status: Against Medical Advice | Payer: Medicare Other | Attending: Emergency Medicine | Admitting: Emergency Medicine

## 2019-02-19 ENCOUNTER — Other Ambulatory Visit: Payer: Self-pay

## 2019-02-19 DIAGNOSIS — Z5321 Procedure and treatment not carried out due to patient leaving prior to being seen by health care provider: Secondary | ICD-10-CM | POA: Diagnosis not present

## 2019-02-19 DIAGNOSIS — R0789 Other chest pain: Secondary | ICD-10-CM | POA: Diagnosis present

## 2019-02-19 LAB — CBC
HCT: 41.9 % (ref 39.0–52.0)
Hemoglobin: 14.4 g/dL (ref 13.0–17.0)
MCH: 33.3 pg (ref 26.0–34.0)
MCHC: 34.4 g/dL (ref 30.0–36.0)
MCV: 97 fL (ref 80.0–100.0)
Platelets: 255 10*3/uL (ref 150–400)
RBC: 4.32 MIL/uL (ref 4.22–5.81)
RDW: 12 % (ref 11.5–15.5)
WBC: 9.4 10*3/uL (ref 4.0–10.5)
nRBC: 0 % (ref 0.0–0.2)

## 2019-02-19 LAB — BASIC METABOLIC PANEL
Anion gap: 8 (ref 5–15)
BUN: 12 mg/dL (ref 8–23)
CO2: 26 mmol/L (ref 22–32)
Calcium: 10 mg/dL (ref 8.9–10.3)
Chloride: 103 mmol/L (ref 98–111)
Creatinine, Ser: 1.18 mg/dL (ref 0.61–1.24)
GFR calc Af Amer: >60 mL/min (ref >60)
GFR calc non Af Amer: >60 mL/min (ref >60)
Glucose, Bld: 89 mg/dL (ref 70–99)
Potassium: 4 mmol/L (ref 3.5–5.1)
Sodium: 137 mmol/L (ref 135–145)

## 2019-02-19 LAB — TROPONIN I (HIGH SENSITIVITY)
Troponin I (High Sensitivity): 12 ng/L (ref <18)
Troponin I (High Sensitivity): 9 ng/L (ref <18)

## 2019-02-19 MED ORDER — SODIUM CHLORIDE 0.9% FLUSH: 3.0000 mL | Freq: Once | INTRAVENOUS | Status: DC
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
## 2019-02-20 NOTE — ED Notes (Signed)
Pt states his sister is in [place] and is dying. He was headed there when chest pain started. States he feels its stress and is going to leave so he can see his sister before she passes. Advised pt to stay but he thanked [place] for help and left.

## 2019-03-06 ENCOUNTER — Other Ambulatory Visit: Payer: Self-pay | Admitting: Nurse Practitioner

## 2019-03-06 DIAGNOSIS — E7849 Other hyperlipidemia: Secondary | ICD-10-CM

## 2019-03-06 DIAGNOSIS — Z9889 Other specified postprocedural states: Secondary | ICD-10-CM

## 2019-03-06 DIAGNOSIS — I10 Essential (primary) hypertension: Secondary | ICD-10-CM

## 2019-03-06 DIAGNOSIS — I259 Chronic ischemic heart disease, unspecified: Secondary | ICD-10-CM

## 2019-03-07 ENCOUNTER — Other Ambulatory Visit: Payer: Self-pay | Admitting: Nurse Practitioner

## 2019-03-07 DIAGNOSIS — I259 Chronic ischemic heart disease, unspecified: Secondary | ICD-10-CM

## 2019-03-07 DIAGNOSIS — Z9889 Other specified postprocedural states: Secondary | ICD-10-CM

## 2019-03-07 DIAGNOSIS — E7849 Other hyperlipidemia: Secondary | ICD-10-CM

## 2019-03-07 DIAGNOSIS — I10 Essential (primary) hypertension: Secondary | ICD-10-CM

## 2019-06-11 ENCOUNTER — Other Ambulatory Visit: Payer: Medicare Other | Admitting: Internal Medicine

## 2019-06-11 ENCOUNTER — Other Ambulatory Visit: Payer: Self-pay

## 2019-06-11 DIAGNOSIS — I252 Old myocardial infarction: Secondary | ICD-10-CM

## 2019-06-11 DIAGNOSIS — K219 Gastro-esophageal reflux disease without esophagitis: Secondary | ICD-10-CM

## 2019-06-11 DIAGNOSIS — E782 Mixed hyperlipidemia: Secondary | ICD-10-CM

## 2019-06-11 DIAGNOSIS — R7302 Impaired glucose tolerance (oral): Secondary | ICD-10-CM

## 2019-06-11 DIAGNOSIS — M8580 Other specified disorders of bone density and structure, unspecified site: Secondary | ICD-10-CM

## 2019-06-11 DIAGNOSIS — R911 Solitary pulmonary nodule: Secondary | ICD-10-CM

## 2019-06-11 DIAGNOSIS — E8881 Metabolic syndrome: Secondary | ICD-10-CM

## 2019-06-12 LAB — CBC WITH DIFFERENTIAL/PLATELET
Absolute Monocytes: 739 cells/uL (ref 200–950)
Basophils Absolute: 59 cells/uL (ref 0–200)
Basophils Relative: 0.9 %
Eosinophils Absolute: 152 cells/uL (ref 15–500)
Eosinophils Relative: 2.3 %
HCT: 42.8 % (ref 38.5–50.0)
Hemoglobin: 14.8 g/dL (ref 13.2–17.1)
Lymphs Abs: 1987 cells/uL (ref 850–3900)
MCH: 32.8 pg (ref 27.0–33.0)
MCHC: 34.6 g/dL (ref 32.0–36.0)
MCV: 94.9 fL (ref 80.0–100.0)
MPV: 9.8 fL (ref 7.5–12.5)
Monocytes Relative: 11.2 %
Neutro Abs: 3663 cells/uL (ref 1500–7800)
Neutrophils Relative %: 55.5 %
Platelets: 231 10*3/uL (ref 140–400)
RBC: 4.51 10*6/uL (ref 4.20–5.80)
RDW: 12.6 % (ref 11.0–15.0)
Total Lymphocyte: 30.1 %
WBC: 6.6 10*3/uL (ref 3.8–10.8)

## 2019-06-12 LAB — COMPLETE METABOLIC PANEL WITH GFR
AG Ratio: 1.6 (calc) (ref 1.0–2.5)
ALT: 15 U/L (ref 9–46)
AST: 15 U/L (ref 10–35)
Albumin: 4.4 g/dL (ref 3.6–5.1)
Alkaline phosphatase (APISO): 46 U/L (ref 35–144)
BUN: 14 mg/dL (ref 7–25)
CO2: 25 mmol/L (ref 20–32)
Calcium: 10.4 mg/dL — ABNORMAL HIGH (ref 8.6–10.3)
Chloride: 103 mmol/L (ref 98–110)
Creat: 1.24 mg/dL (ref 0.70–1.25)
GFR, Est African American: 70 mL/min/{1.73_m2} (ref >=60)
GFR, Est Non African American: 60 mL/min/{1.73_m2} (ref >=60)
Globulin: 2.7 g/dL (calc) (ref 1.9–3.7)
Glucose, Bld: 85 mg/dL (ref 65–99)
Potassium: 4.1 mmol/L (ref 3.5–5.3)
Sodium: 139 mmol/L (ref 135–146)
Total Bilirubin: 0.8 mg/dL (ref 0.2–1.2)
Total Protein: 7.1 g/dL (ref 6.1–8.1)

## 2019-06-12 LAB — MICROALBUMIN / CREATININE URINE RATIO
Creatinine, Urine: 82 mg/dL (ref 20–320)
Microalb Creat Ratio: 5 mcg/mg creat (ref <30)
Microalb, Ur: 0.4 mg/dL

## 2019-06-12 LAB — HEMOGLOBIN A1C
Hgb A1c MFr Bld: 5.3 % of total Hgb (ref <5.7)
Mean Plasma Glucose: 105 (calc)
eAG (mmol/L): 5.8 (calc)

## 2019-06-12 LAB — PSA: PSA: 0.5 ng/mL (ref <=4.0)

## 2019-06-12 LAB — LIPID PANEL
Cholesterol: 225 mg/dL — ABNORMAL HIGH (ref <200)
HDL: 40 mg/dL (ref >=40)
LDL Cholesterol (Calc): 144 mg/dL (calc) — ABNORMAL HIGH
Non-HDL Cholesterol (Calc): 185 mg/dL (calc) — ABNORMAL HIGH (ref <130)
Total CHOL/HDL Ratio: 5.6 (calc) — ABNORMAL HIGH (ref <5.0)
Triglycerides: 268 mg/dL — ABNORMAL HIGH (ref <150)

## 2019-06-16 ENCOUNTER — Encounter: Payer: Self-pay | Admitting: Internal Medicine

## 2019-06-16 ENCOUNTER — Other Ambulatory Visit: Payer: Self-pay

## 2019-06-16 ENCOUNTER — Ambulatory Visit: Payer: Medicare Other | Admitting: Internal Medicine

## 2019-06-16 VITALS — BP 130/80 | HR 76 | Temp 98.0°F | Ht 72.0 in | Wt 217.0 lb

## 2019-06-16 DIAGNOSIS — I252 Old myocardial infarction: Secondary | ICD-10-CM

## 2019-06-16 DIAGNOSIS — E782 Mixed hyperlipidemia: Secondary | ICD-10-CM | POA: Diagnosis not present

## 2019-06-16 DIAGNOSIS — Z86010 Personal history of colonic polyps: Secondary | ICD-10-CM

## 2019-06-16 DIAGNOSIS — Z860101 Personal history of adenomatous and serrated colon polyps: Secondary | ICD-10-CM

## 2019-06-16 DIAGNOSIS — K2100 Gastro-esophageal reflux disease with esophagitis, without bleeding: Secondary | ICD-10-CM

## 2019-06-16 DIAGNOSIS — Z125 Encounter for screening for malignant neoplasm of prostate: Secondary | ICD-10-CM

## 2019-06-16 DIAGNOSIS — I10 Essential (primary) hypertension: Secondary | ICD-10-CM | POA: Diagnosis not present

## 2019-06-16 DIAGNOSIS — Z87442 Personal history of urinary calculi: Secondary | ICD-10-CM | POA: Diagnosis not present

## 2019-06-16 DIAGNOSIS — Z0000 Encounter for general adult medical examination without abnormal findings: Secondary | ICD-10-CM | POA: Diagnosis not present

## 2019-06-16 DIAGNOSIS — M8580 Other specified disorders of bone density and structure, unspecified site: Secondary | ICD-10-CM

## 2019-06-16 LAB — POCT URINALYSIS DIPSTICK
Appearance: NEGATIVE
Bilirubin, UA: NEGATIVE
Glucose, UA: NEGATIVE
Ketones, UA: NEGATIVE
Leukocytes, UA: NEGATIVE
Nitrite, UA: NEGATIVE
Odor: NEGATIVE
Protein, UA: NEGATIVE
Spec Grav, UA: 1.01 (ref 1.010–1.025)
Urobilinogen, UA: 0.2 E.U./dL
pH, UA: 6.5 (ref 5.0–8.0)

## 2019-06-16 MED ORDER — GENERIC EXTERNAL MEDICATION
Status: DC
Start: ? — End: 2019-06-16

## 2019-06-16 MED ORDER — SODIUM CHLORIDE 0.9 % IV SOLN
10.00 | INTRAVENOUS | Status: DC
Start: ? — End: 2019-06-16

## 2019-06-16 MED ORDER — ROSUVASTATIN CALCIUM 5 MG PO TABS: 5.0000 mg | ORAL_TABLET | Freq: Every day | ORAL | 0 refills | Status: DC

## 2019-06-16 MED ORDER — NITROGLYCERIN 0.4 MG SL SUBL: 0.4000 mg | SUBLINGUAL_TABLET | SUBLINGUAL | 3 refills | Status: DC | PRN

## 2019-06-19 NOTE — H&P (View-Only) (Signed)
CARDIOLOGY OFFICE NOTE  Date:  06/22/2019    Johnny [name] Date of Birth: Nov 15, 1950 Medical Record [id number]  PCP:  [name], MD  Cardiologist:  [name]   Chief Complaint  Patient presents with  . Follow-up    Seen for Dr. [name]    History of Present Illness: Johnny [name] is a 69 y.o. male who presents today for a work in visit at request of his PCP. Seen for Dr. [name].   He has a history of HTN, HLD, TIA, GERD, and CADwith priormyocardial infarction. Former smoker. Cardiac cath in 2017 revealed a ramus lesion at 95% stenosed; first diagonal lesion 90% stenosed; mid LAD to distal LAD lesion 50% stenosed; and left ventricular systolic function which was normal. He was managed medically.   Presented back to the hospital in 01/2018 with chest pain and was cathed - unchanged - medical management to continue. Intolerant of nitrates due to headache. Statin intolerance but on low dose Crestor.   Last seen by Mickel Baas in 01/2019 - overall stable. Had gotten off track with CV risk factor modification.   The patient does not have symptoms concerning for COVID-19 infection (fever, chills, cough, or new shortness of breath).   Comes in today. Here alone. PCP asked him to touch base. To see Dr. [name] next month. He notes more chest pain - was "every once in a while" he would have chest pain with exertion - now with more minimal activity - it is a sharp pain - clearly exertional. He is using NTG with some relief. Pretty much having these spells now every day. He notes his wife and he thought that maybe this was related to taking Ranexa - so he stopped taking this as of Friday - but no better. Had a little bit of chest pain earlier today. It is all with activity - he denies any symptoms with rest.   Past Medical History:  Diagnosis Date  . Adenomatous polyps   . Bronchitis   . CAD in native artery 2017  . Chronic back pain   . GERD (gastroesophageal reflux disease)   .  Hyperlipidemia   . Hypertension   . Internal hemorrhoids   . Low testosterone   . Myocardial infarction ([place])   . Osteopenia   . Shortness of breath dyspnea   . Spondylosis   . TIA (transient ischemic attack) 2008  . Ureterolithiasis   . Ventral hernia   . Vitamin D deficiency     Past Surgical History:  Procedure Laterality Date  . BACK SURGERY     2 surgeries  . CARDIAC CATHETERIZATION N/A 09/12/2015   Procedure: Left Heart Cath and Coronary Angiography;  Surgeon: Lorretta Harp, MD;  Location: [facility];  Service: Cardiovascular;  Laterality: N/A;  . LEFT HEART CATH AND CORONARY ANGIOGRAPHY N/A 01/27/2018   Procedure: LEFT HEART CATH AND CORONARY ANGIOGRAPHY;  Surgeon: Jettie Booze, MD;  Location: [facility];  Service: Cardiovascular;  Laterality: N/A;     Medications: Current Meds  Medication Sig  . BYSTOLIC 2.5 MG tablet TAKE 1 TABLET BY MOUTH EVERY DAY  . clopidogrel (PLAVIX) 75 MG tablet Take 1 tablet (75 mg total) by mouth daily.  . Cyanocobalamin (VITAMIN B-12) 1000 MCG SUBL Place 1 each under the tongue every morning.  Marland Kitchen MAGNESIUM MALATE PO Take 200 mg by mouth 2 (two) times daily.   . nitroGLYCERIN (NITROSTAT) 0.4 MG SL tablet Place 0.4 mg under  the tongue every 5 (five) minutes as needed for chest pain.  Marland Kitchen ondansetron (ZOFRAN-ODT) 4 MG disintegrating tablet Take 4 mg by mouth every 8 (eight) hours as needed.  Marland Kitchen oxyCODONE-acetaminophen (PERCOCET) 10-325 MG tablet SMARTSIG:1-2 Tablet(s) By Mouth Every 6 Hours PRN  . RANEXA 500 MG 12 hr tablet TAKE 1 TABLET BY MOUTH TWICE A DAY  . rosuvastatin (CRESTOR) 5 MG tablet Take 1 tablet (5 mg total) by mouth daily.  . tamsulosin (FLOMAX) 0.4 MG CAPS capsule Take by mouth.  . [DISCONTINUED] Calcium-Magnesium-Vitamin D (CALCIUM MAGNESIUM PO) Take 1 tablet by mouth daily.   . [DISCONTINUED] nitroGLYCERIN (NITROSTAT) 0.4 MG SL tablet Place 1 tablet (0.4 mg total) under the tongue every 5 (five) minutes as  needed for chest pain. (Patient taking differently: Place 0.4 mg under the tongue every 5 (five) minutes as needed for chest pain. )     Allergies: Allergies  Allergen Reactions  . Atorvastatin Other (See Comments)    Joint pain Joint pain  . Metadate Cd [Methylphenidate Hcl Er (Cd)] Other (See Comments)    Burning, GI upset, bloating  . Other Other (See Comments)    Burning, GI upset, bloating  . Praluent [Alirocumab]     Muscle pain and flu like symptoms  . Pravastatin Other (See Comments)  . Hydrocodone Nausea Only    Can take with food Can take with food  . Levofloxacin Nausea And Vomiting  . Levofloxacin In D5w Nausea And Vomiting  . Morphine And Related Nausea And Vomiting    Social History: The patient  reports that he quit smoking about 41 years ago. His smoking use included cigarettes. He quit after 10.00 years of use. He has never used smokeless tobacco. He reports that he does not drink alcohol or use drugs.   Family History: The patient's family history includes Emphysema in his father; Heart attack in his mother; Heart disease in his mother; Stroke in his mother.   Review of Systems: Please see the history of present illness.   All other systems are reviewed and negative.   Physical Exam: VS:  BP 130/78   Pulse 63   Ht 6' (1.829 m)   Wt 219 lb (99.3 kg)   SpO2 99%   BMI 29.70 kg/m  .  BMI Body mass index is 29.7 kg/m.  Wt Readings from Last 3 Encounters:  06/22/19 219 lb (99.3 kg)  06/16/19 217 lb (98.4 kg)  02/16/19 218 lb 3.2 oz (99 kg)    General: Pleasant. Well developed, well nourished and in no acute distress.   HEENT: Normal.  Neck: Supple, no JVD, carotid bruits, or masses noted.  Cardiac: Regular rate and rhythm. No murmurs, rubs, or gallops. No edema.  Respiratory:  Lungs are clear to auscultation bilaterally with normal work of breathing.  GI: Soft and nontender.  MS: No deformity or atrophy. Gait and ROM intact.  Skin: Warm and dry.  Color is normal.  Neuro:  Strength and sensation are intact and no gross focal deficits noted.  Psych: Alert, appropriate and with normal affect.   LABORATORY DATA:  EKG:  EKG is ordered today. This demonstrates NSR - there is baseline artifact - but has inferior ST changes noted -  no acute changes. Reviewed with Dr. Burt Knack.   Lab Results  Component Value Date   WBC 6.6 06/11/2019   HGB 14.8 06/11/2019   HCT 42.8 06/11/2019   PLT 231 06/11/2019   GLUCOSE 85 06/11/2019   CHOL 225 (H)  06/11/2019   TRIG 268 (H) 06/11/2019   HDL 40 06/11/2019   LDLCALC 144 (H) 06/11/2019   ALT 15 06/11/2019   AST 15 06/11/2019   NA 139 06/11/2019   K 4.1 06/11/2019   CL 103 06/11/2019   CREATININE 1.24 06/11/2019   BUN 14 06/11/2019   CO2 25 06/11/2019   TSH 0.637 Test methodology is 3rd generation TSH 05/01/2007   PSA 0.5 06/11/2019   INR 1.06 09/12/2015   HGBA1C 5.3 06/11/2019   MICROALBUR 0.4 06/11/2019     BNP (last 3 results) No results for input(s): BNP in the last 8760 hours.  ProBNP (last 3 results) No results for input(s): PROBNP in the last 8760 hours.   Other Studies Reviewed Today:  LEFT HEART CATH AND CORONARY ANGIOGRAPHY9/2019  Conclusion     1st Diag lesion is 90% stenosed.  Ramus lesion is 95% stenosed.  Dist LAD lesion is 50% stenosed. This area appears improved since the last cath.  2nd Diag lesion is 90% stenosed.  1st Mrg lesion is 80% stenosed.  Ost 1st Diag lesion is 80% stenosed.  The left ventricular systolic function is normal.  LV end diastolic pressure is normal.  The left ventricular ejection fraction is 55-65% by visual estimate.  There is no aortic valve stenosis.  Continue medical therapy for branch vessel CAD. The diagonals and OM are small vessels.  No significant change since the last cath in 2017.     EchoStudy Conclusions9/2019  - Left ventricle: The cavity size was normal. There was moderate focal basal  hypertrophy of the septum. Systolic function was normal. The estimated ejection fraction was in the range of 60% to 65%. Wall motion was normal; there were no regional wall motion abnormalities. Doppler parameters are consistent with abnormal left ventricular relaxation (grade 1 diastolic dysfunction). Doppler parameters are consistent with indeterminate ventricular filling pressure. - Aortic valve: Transvalvular velocity was within the normal range. There was no stenosis. There was no regurgitation. Valve area (VTI): 3.34 cm^2. Valve area (Vmax): 3.29 cm^2. Valve area (Vmean): 3.33 cm^2. - Mitral valve: Transvalvular velocity was within the normal range. There was no evidence for stenosis. There was trivial regurgitation. - Right ventricle: The cavity size was normal. Wall thickness was normal. Systolic function was normal. - Tricuspid valve: There was trivial regurgitation. - Pulmonary arteries: Systolic pressure was within the normal range. PA peak pressure: 29 mm Hg (S).  CT CHESTIMPRESSION9/2019: 1. Opacities in the right middle lobe, lingula, and bases are primarily atelectasis. The opacity is most prominent in the right base and a superimposed developing infiltrate is not completely excluded. However, I favor the findings are all most likely worsening atelectasis. Recommend clinical correlation and attention on follow-up. 2. There is an exophytic mass measuring 2.1 cm off the left kidney with an attenuation of 21 are 22 Hounsfield units. This mass may simply be a complicated cyst but is indeterminate on this study. Recommend an ultrasound in an attempt to confirm the cystic nature of this mass. 3. Coronary artery calcifications. 4. Emphysematous changes in the lungs. 5. 4.5 mm nodule in the left base and 3 mm nodule in the right base. No follow-up needed if patient is low-risk. Non-contrast chest CT can be considered in 12 months if patient is  high-risk. This recommendation follows the consensus statement: Guidelines for Management of Incidental Pulmonary Nodules Detected on CT Images: From the Fleischner Society 2017; Radiology 2017; 284:228-243. 6. Right nephrolithiasis with no obstruction.  Emphysema (ICD10-J43.9).  ASSESSMENT AND PLAN:  1.  CAD - managed medically - now with more unstable angina - cardiac catheterization has been recommended - discussed with Dr. Burt Knack and will proceed tomorrow. He is to curtail all activities. Restart Ranexa. Lab today. COVID test today. The patient understands that risks include but are not limited to stroke (1 in 1000), death (1 in 15), kidney failure [usually temporary] (1 in 500), bleeding (1 in 200), allergic reaction [possibly serious] (1 in 200), as well as possible COVID exposure and agrees to proceed. COVID test today. Sending for CXR today.   2. HLD - on just low dose statin - has had tolerability issues - LDL not at goal - consider lipid clinic referral - only on low dose Crestor - will need to readdress.   3. HTN - BP is ok - no changes made today.   4. Abnormal CT - noted lung nodule - needs follow up non contrast study per prior recommendation - will arrange in the next few weeks. Will get CXR today.   5. Former smoker  6. COVID-19 Education: The signs and symptoms of COVID-19 were discussed with the patient and how to seek care for testing (follow up with PCP or arrange E-visit).  The importance of social distancing, staying at home, hand hygiene and wearing a mask when out in public were discussed today.  Current medicines are reviewed with the patient today.  The patient does not have concerns regarding medicines other than what has been noted above.  The following changes have been made:  See above.  Labs/ tests ordered today include:    Orders Placed This Encounter  Procedures  . DG Chest 2 View  . CT CHEST WO CONTRAST  . Comprehensive metabolic panel  .  CBC  . EKG 12-Lead     Disposition:   FU with me in about 2 weeks.   Patient is agreeable to this plan and will call if any problems develop in the interim.   SignedTruitt Merle, NP  06/22/2019 11:38 AM  [facility] [address] [place], [place]  [address] Phone: [phone] Fax: [phone]

## 2019-06-19 NOTE — Progress Notes (Signed)
CARDIOLOGY OFFICE NOTE  Date:  06/22/2019    Johnny [name] Date of Birth: 1950-11-05 Medical Record [id number]  PCP:  Johnny Showers, MD  Cardiologist:  Johnny [name]   Chief Complaint  Patient presents with  . Follow-up    Seen for Dr. Johnsie [name]    History of Present Illness: Johnny [name] is a 69 y.o. male who presents today for a work in visit at request of his PCP. Seen for Dr. Johnsie [name].   He has a history of HTN, HLD, TIA, GERD, and CADwith priormyocardial infarction. Former smoker. Cardiac cath in 2017 revealed a ramus lesion at 95% stenosed; first diagonal lesion 90% stenosed; mid LAD to distal LAD lesion 50% stenosed; and left ventricular systolic function which was normal. He was managed medically.   Presented back to the hospital in 01/2018 with chest pain and was cathed - unchanged - medical management to continue. Intolerant of nitrates due to headache. Statin intolerance but on low dose Crestor.   Last seen by Johnny [name] in 01/2019 - overall stable. Had gotten off track with CV risk factor modification.   The patient does not have symptoms concerning for COVID-19 infection (fever, chills, cough, or new shortness of breath).   Comes in today. Here alone. PCP asked him to touch base. To see Dr. Johnsie [name] next month. He notes more chest pain - was "every once in a while" he would have chest pain with exertion - now with more minimal activity - it is a sharp pain - clearly exertional. He is using NTG with some relief. Pretty much having these spells now every day. He notes his wife and he thought that maybe this was related to taking Ranexa - so he stopped taking this as of Friday - but no better. Had a little bit of chest pain earlier today. It is all with activity - he denies any symptoms with rest.   Past Medical History:  Diagnosis Date  . Adenomatous polyps   . Bronchitis   . CAD in native artery 2017  . Chronic back pain   . GERD (gastroesophageal reflux disease)   .  Hyperlipidemia   . Hypertension   . Internal hemorrhoids   . Low testosterone   . Myocardial infarction ([place])   . Osteopenia   . Shortness of breath dyspnea   . Spondylosis   . TIA (transient ischemic attack) 2008  . Ureterolithiasis   . Ventral hernia   . Vitamin D deficiency     Past Surgical History:  Procedure Laterality Date  . BACK SURGERY     2 surgeries  . CARDIAC CATHETERIZATION N/A 09/12/2015   Procedure: Left Heart Cath and Coronary Angiography;  Surgeon: Johnny Harp, MD;  Location: [facility];  Service: Cardiovascular;  Laterality: N/A;  . LEFT HEART CATH AND CORONARY ANGIOGRAPHY N/A 01/27/2018   Procedure: LEFT HEART CATH AND CORONARY ANGIOGRAPHY;  Surgeon: Johnny Booze, MD;  Location: [facility];  Service: Cardiovascular;  Laterality: N/A;     Medications: Current Meds  Medication Sig  . BYSTOLIC 2.5 MG tablet TAKE 1 TABLET BY MOUTH EVERY DAY  . clopidogrel (PLAVIX) 75 MG tablet Take 1 tablet (75 mg total) by mouth daily.  . Cyanocobalamin (VITAMIN B-12) 1000 MCG SUBL Place 1 each under the tongue every morning.  Marland Kitchen MAGNESIUM MALATE PO Take 200 mg by mouth 2 (two) times daily.   . nitroGLYCERIN (NITROSTAT) 0.4 MG SL tablet Place 0.4 mg under  the tongue every 5 (five) minutes as needed for chest pain.  Marland Kitchen ondansetron (ZOFRAN-ODT) 4 MG disintegrating tablet Take 4 mg by mouth every 8 (eight) hours as needed.  Marland Kitchen oxyCODONE-acetaminophen (PERCOCET) 10-325 MG tablet SMARTSIG:1-2 Tablet(s) By Mouth Every 6 Hours PRN  . RANEXA 500 MG 12 hr tablet TAKE 1 TABLET BY MOUTH TWICE A DAY  . rosuvastatin (CRESTOR) 5 MG tablet Take 1 tablet (5 mg total) by mouth daily.  . tamsulosin (FLOMAX) 0.4 MG CAPS capsule Take by mouth.  . [DISCONTINUED] Calcium-Magnesium-Vitamin D (CALCIUM MAGNESIUM PO) Take 1 tablet by mouth daily.   . [DISCONTINUED] nitroGLYCERIN (NITROSTAT) 0.4 MG SL tablet Place 1 tablet (0.4 mg total) under the tongue every 5 (five) minutes as  needed for chest pain. (Patient taking differently: Place 0.4 mg under the tongue every 5 (five) minutes as needed for chest pain. )     Allergies: Allergies  Allergen Reactions  . Atorvastatin Other (See Comments)    Joint pain Joint pain  . Metadate Cd [Methylphenidate Hcl Er (Cd)] Other (See Comments)    Burning, GI upset, bloating  . Other Other (See Comments)    Burning, GI upset, bloating  . Praluent [Alirocumab]     Muscle pain and flu like symptoms  . Pravastatin Other (See Comments)  . Hydrocodone Nausea Only    Can take with food Can take with food  . Levofloxacin Nausea And Vomiting  . Levofloxacin In D5w Nausea And Vomiting  . Morphine And Related Nausea And Vomiting    Social History: The patient  reports that he quit smoking about 41 years ago. His smoking use included cigarettes. He quit after 10.00 years of use. He has never used smokeless tobacco. He reports that he does not drink alcohol or use drugs.   Family History: The patient's family history includes Emphysema in his father; Heart attack in his mother; Heart disease in his mother; Stroke in his mother.   Review of Systems: Please see the history of present illness.   All other systems are reviewed and negative.   Physical Exam: VS:  BP 130/78   Pulse 63   Ht 6' (1.829 m)   Wt 219 lb (99.3 kg)   SpO2 99%   BMI 29.70 kg/m  .  BMI Body mass index is 29.7 kg/m.  Wt Readings from Last 3 Encounters:  06/22/19 219 lb (99.3 kg)  06/16/19 217 lb (98.4 kg)  02/16/19 218 lb 3.2 oz (99 kg)    General: Pleasant. Well developed, well nourished and in no acute distress.   HEENT: Normal.  Neck: Supple, no JVD, carotid bruits, or masses noted.  Cardiac: Regular rate and rhythm. No murmurs, rubs, or gallops. No edema.  Respiratory:  Lungs are clear to auscultation bilaterally with normal work of breathing.  GI: Soft and nontender.  MS: No deformity or atrophy. Gait and ROM intact.  Skin: Warm and dry.  Color is normal.  Neuro:  Strength and sensation are intact and no gross focal deficits noted.  Psych: Alert, appropriate and with normal affect.   LABORATORY DATA:  EKG:  EKG is ordered today. This demonstrates NSR - there is baseline artifact - but has inferior ST changes noted -  no acute changes. Reviewed with Dr. Burt Knack.   Lab Results  Component Value Date   WBC 6.6 06/11/2019   HGB 14.8 06/11/2019   HCT 42.8 06/11/2019   PLT 231 06/11/2019   GLUCOSE 85 06/11/2019   CHOL 225 (H)  06/11/2019   TRIG 268 (H) 06/11/2019   HDL 40 06/11/2019   LDLCALC 144 (H) 06/11/2019   ALT 15 06/11/2019   AST 15 06/11/2019   NA 139 06/11/2019   K 4.1 06/11/2019   CL 103 06/11/2019   CREATININE 1.24 06/11/2019   BUN 14 06/11/2019   CO2 25 06/11/2019   TSH 0.637 Test methodology is 3rd generation TSH 05/01/2007   PSA 0.5 06/11/2019   INR 1.06 09/12/2015   HGBA1C 5.3 06/11/2019   MICROALBUR 0.4 06/11/2019     BNP (last 3 results) No results for input(s): BNP in the last 8760 hours.  ProBNP (last 3 results) No results for input(s): PROBNP in the last 8760 hours.   Other Studies Reviewed Today:  LEFT HEART CATH AND CORONARY ANGIOGRAPHY9/2019  Conclusion     1st Diag lesion is 90% stenosed.  Ramus lesion is 95% stenosed.  Dist LAD lesion is 50% stenosed. This area appears improved since the last cath.  2nd Diag lesion is 90% stenosed.  1st Mrg lesion is 80% stenosed.  Ost 1st Diag lesion is 80% stenosed.  The left ventricular systolic function is normal.  LV end diastolic pressure is normal.  The left ventricular ejection fraction is 55-65% by visual estimate.  There is no aortic valve stenosis.  Continue medical therapy for branch vessel CAD. The diagonals and OM are small vessels.  No significant change since the last cath in 2017.     EchoStudy Conclusions9/2019  - Left ventricle: The cavity size was normal. There was moderate focal basal  hypertrophy of the septum. Systolic function was normal. The estimated ejection fraction was in the range of 60% to 65%. Wall motion was normal; there were no regional wall motion abnormalities. Doppler parameters are consistent with abnormal left ventricular relaxation (grade 1 diastolic dysfunction). Doppler parameters are consistent with indeterminate ventricular filling pressure. - Aortic valve: Transvalvular velocity was within the normal range. There was no stenosis. There was no regurgitation. Valve area (VTI): 3.34 cm^2. Valve area (Vmax): 3.29 cm^2. Valve area (Vmean): 3.33 cm^2. - Mitral valve: Transvalvular velocity was within the normal range. There was no evidence for stenosis. There was trivial regurgitation. - Right ventricle: The cavity size was normal. Wall thickness was normal. Systolic function was normal. - Tricuspid valve: There was trivial regurgitation. - Pulmonary arteries: Systolic pressure was within the normal range. PA peak pressure: 29 mm Hg (S).  CT CHESTIMPRESSION9/2019: 1. Opacities in the right middle lobe, lingula, and bases are primarily atelectasis. The opacity is most prominent in the right base and a superimposed developing infiltrate is not completely excluded. However, I favor the findings are all most likely worsening atelectasis. Recommend clinical correlation and attention on follow-up. 2. There is an exophytic mass measuring 2.1 cm off the left kidney with an attenuation of 21 are 22 Hounsfield units. This mass may simply be a complicated cyst but is indeterminate on this study. Recommend an ultrasound in an attempt to confirm the cystic nature of this mass. 3. Coronary artery calcifications. 4. Emphysematous changes in the lungs. 5. 4.5 mm nodule in the left base and 3 mm nodule in the right base. No follow-up needed if patient is low-risk. Non-contrast chest CT can be considered in 12 months if patient is  high-risk. This recommendation follows the consensus statement: Guidelines for Management of Incidental Pulmonary Nodules Detected on CT Images: From the Fleischner Society 2017; Radiology 2017; 284:228-243. 6. Right nephrolithiasis with no obstruction.  Emphysema (ICD10-J43.9).  ASSESSMENT AND PLAN:  1.  CAD - managed medically - now with more unstable angina - cardiac catheterization has been recommended - discussed with Dr. Burt Knack and will proceed tomorrow. He is to curtail all activities. Restart Ranexa. Lab today. COVID test today. The patient understands that risks include but are not limited to stroke (1 in 1000), death (1 in 95), kidney failure [usually temporary] (1 in 500), bleeding (1 in 200), allergic reaction [possibly serious] (1 in 200), as well as possible COVID exposure and agrees to proceed. COVID test today. Sending for CXR today.   2. HLD - on just low dose statin - has had tolerability issues - LDL not at goal - consider lipid clinic referral - only on low dose Crestor - will need to readdress.   3. HTN - BP is ok - no changes made today.   4. Abnormal CT - noted lung nodule - needs follow up non contrast study per prior recommendation - will arrange in the next few weeks. Will get CXR today.   5. Former smoker  6. COVID-19 Education: The signs and symptoms of COVID-19 were discussed with the patient and how to seek care for testing (follow up with PCP or arrange E-visit).  The importance of social distancing, staying at home, hand hygiene and wearing a mask when out in public were discussed today.  Current medicines are reviewed with the patient today.  The patient does not have concerns regarding medicines other than what has been noted above.  The following changes have been made:  See above.  Labs/ tests ordered today include:    Orders Placed This Encounter  Procedures  . DG Chest 2 View  . CT CHEST WO CONTRAST  . Comprehensive metabolic panel  .  CBC  . EKG 12-Lead     Disposition:   FU with me in about 2 weeks.   Patient is agreeable to this plan and will call if any problems develop in the interim.   SignedTruitt Merle, NP  06/22/2019 11:38 AM  [facility] [address] [place], [place]  [address] Phone: [phone] Fax: [phone]

## 2019-06-21 NOTE — Progress Notes (Signed)
Subjective:    Patient ID: Johnny [name], male    DOB: Feb 05, 1951, 69 y.o.   MRN: [id number]  HPI 69 year old male in today for Medicare wellness exam, health maintenance exam and evaluation of medical issues.  He has a history of osteopenia, hypertension, hyperlipidemia, internal hemorrhoids, adenomatous colon polyps, low testosterone, GE reflux, vitamin D deficiency and prediabetes.  Admitted for chest pain September 2019.  Chest pain started while he was trying to help his son who was in a wheelchair to get out of the house.  MI was ruled out.  Subsequently had cardiac cath and was found to have normal left ventricular systolic function and grade 1 diastolic dysfunction.  Repeat cath showed no significant change from 2017 and medical management was recommended.  History of NSTEMI in 2017.  He had cath by Dr. Alvester Chou noting 50% mid LAD, 95% ramus and 90% first diagonal.  Was placed on medical management including Crestor beta-blocker and Imdur.  History of mixed hyperlipidemia.  History of 5 mm right middle lobe nodule on chest CT that was discovered during renal stone study in 2016 and was referred to pulmonologist.  History of recurrent kidney stones 2017.  Social history: He is married.  He has a son who was in a very serious motor vehicle accident and has been recovering over the past year or more.  His wife has multiple sclerosis and is disabled.  Patient is a retired Agricultural consultant.  He does not smoke.  Very occasional alcohol consumption.  2 other adult children.  He had upper endoscopy by Dr. 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  History of GE reflux and hiatal hernia.  History of adenomatous colon polyps on colonoscopy in July 2005 but repeat study in June 2010 was normal.  Had anal warts removed in 2005.  Lumbar surgery by Dr. 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  History of vitamin D deficiency.   History of prediabetes.  History of osteopenia.  He smoked in the remote past but quit around 1979   Review of Systems  Respiratory: Negative.   Cardiovascular: Negative for chest pain.  Gastrointestinal: Negative.   Genitourinary: Negative.   Psychiatric/Behavioral: Negative.    Has upcoming cardiology evaluation    Objective:   Physical Exam Blood pressure 130/80, pulse 76 temperature 98 degrees orally pulse oximetry 97% weight 217 pounds.  BMI 29.43 Skin warm and dry.  Nodes none.  TMs are clear.  Neck is supple without JVD thyromegaly or carotid bruits.  Chest clear to auscultation.  Cardiac exam regular rate and rhythm normal S1 and S2 without murmurs.  Abdomen soft nondistended without hepatosplenomegaly masses or tenderness.  Prostate is normal without nodules.  No lower extremity edema.  Neuro no gross focal deficits on brief neurological exam.  Affect thought and judgment are normal.      Assessment & Plan:  Coronary artery disease-has upcoming cardiology appointment.  Had NSTEMI 2017 and was placed on medical management.  Is on Ranexa for angina.  History of essential hypertension-currently on Bystolic  Per Cardiology  Hyperlipidemia- start Crestor 5 mg daily and follow up in March  History of osteopenia  History of adenomatous colon polyps  Metabolic syndrome  History of low testosterone treated with AndroGel  GE reflux treated with PPI  History of impaired glucose tolerance-recent hemoglobin A1c's have been normal  History of 5 mm nodule right middle lobe follow-up previously on chest  CT but not recently  History of kidney stones  Situational stress with son who has serious motor vehicle accident and wife who is disabled with multiple sclerosis  Plan: Agree with Cardiology follow-up in the near future.  Return in 1 year or as needed.  Labs reviewed.  Begin Crestor 5 mg daily and follow-up in March.  Subjective:   Patient presents for Medicare  Annual/Subsequent preventive examination.  Review Past Medical/Family/Social: See above   Risk Factors  Current exercise habits: Good exercise more often Dietary issues discussed: Low-fat low carbohydrate  Cardiac risk factors: History of NSTEMI, family history of heart disease in mother, hyperlipidemia  Depression Screen  (Note: if answer to either of the following is "Yes", a more complete depression screening is indicated)   Over the past two weeks, have you felt down, depressed or hopeless? No  Over the past two weeks, have you felt little interest or pleasure in doing things? No Have you lost interest or pleasure in daily life? No Do you often feel hopeless? No Do you cry easily over simple problems? No   Activities of Daily Living  In your present state of health, do you have any difficulty performing the following activities?:   Driving? No  Managing money? No  Feeding yourself? No  Getting from bed to chair? No  Climbing a flight of stairs? No  Preparing food and eating?: No  Bathing or showering? No  Getting dressed: No  Getting to the toilet? No  Using the toilet:No  Moving around from place to place: No  In the past year have you fallen or had a near fall?:No  Are you sexually active? yes Do you have more than one partner? No   Hearing Difficulties: No  Do you often ask people to speak up or repeat themselves?  Occasionally Do you experience ringing or noises in your ears?  Yes Do you have difficulty understanding soft or whispered voices?  Yes Do you feel that you have a problem with memory?  Occasionally do you often misplace items? No    Home Safety:  Do you have a smoke alarm at your residence? Yes Do you have grab bars in the bathroom?  Yes Do you have throw rugs in your house?  None   Cognitive Testing  Alert? Yes Normal Appearance?Yes  Oriented to person? Yes Place? Yes  Time? Yes  Recall of three objects? Yes  Can perform simple calculations?  Yes  Displays appropriate judgment?Yes  Can read the correct time from a watch face?Yes   List the Names of Other Physician/Practitioners you currently use:  See referral list for the physicians patient is currently seeing.  Cardiology  Urology   Review of Systems: See above   Objective:     General appearance: Appears stated age and mildly obese  Head: Normocephalic, without obvious abnormality, atraumatic  Eyes: conj clear, EOMi PEERLA  Ears: normal TM's and external ear canals both ears  Nose: Nares normal. Septum midline. Mucosa normal. No drainage or sinus tenderness.  Throat: lips, mucosa, and tongue normal; teeth and gums normal  Neck: no adenopathy, no carotid bruit, no JVD, supple, symmetrical, trachea midline and thyroid not enlarged, symmetric, no tenderness/mass/nodules  No CVA tenderness.  Lungs: clear to auscultation bilaterally  Breasts: normal appearance, no masses or tenderness Heart: regular rate and rhythm, S1, S2 normal, no murmur, click, rub or gallop  Abdomen: soft, non-tender; bowel sounds normal; no masses, no organomegaly  Musculoskeletal: ROM normal in all  joints, no crepitus, no deformity, Normal muscle strengthen. Back  is symmetric, no curvature. Skin: Skin color, texture, turgor normal. No rashes or lesions  Lymph nodes: Cervical, supraclavicular, and axillary nodes normal.  Neurologic: CN 2 -12 Normal, Normal symmetric reflexes. Normal coordination and gait  Psych: Alert & Oriented x 3, Mood appear stable.    Assessment:    Annual wellness medicare exam   Plan:    During the course of the visit the patient was educated and counseled about appropriate screening and preventive services including:   Recommend annual flu vaccine  Recommend Covid vaccine when available  No documentation of pneumococcal vaccines-previously declined     Patient Instructions (the written plan) was given to the patient.  Medicare Attestation  I have  personally reviewed:  The patient's medical and social history  Their use of alcohol, tobacco or illicit drugs  Their current medications and supplements  The patient's functional ability including ADLs,fall risks, home safety risks, cognitive, and hearing and visual impairment  Diet and physical activities  Evidence for depression or mood disorders  The patient's weight, height, BMI, and visual acuity have been recorded in the chart. I have made referrals, counseling, and provided education to the patient based on review of the above and I have provided the patient with a written personalized care plan for preventive services.

## 2019-06-22 ENCOUNTER — Ambulatory Visit
Admission: RE | Admit: 2019-06-22 | Discharge: 2019-06-22 | Disposition: A | Discharge status: Home / Self Care | Payer: Medicare Other | Source: Ambulatory Visit | Attending: Nurse Practitioner | Admitting: Nurse Practitioner

## 2019-06-22 ENCOUNTER — Ambulatory Visit: Payer: Medicare Other | Admitting: Nurse Practitioner

## 2019-06-22 ENCOUNTER — Other Ambulatory Visit (HOSPITAL_COMMUNITY)
Admission: RE | Admit: 2019-06-22 | Discharge: 2019-06-22 | Disposition: A | Discharge status: Home / Self Care | Payer: Medicare Other | Source: Ambulatory Visit | Attending: Cardiovascular Disease | Admitting: Cardiovascular Disease

## 2019-06-22 ENCOUNTER — Encounter: Payer: Self-pay | Admitting: Nurse Practitioner

## 2019-06-22 ENCOUNTER — Other Ambulatory Visit: Payer: Self-pay

## 2019-06-22 VITALS — BP 130/78 | HR 63 | Ht 72.0 in | Wt 219.0 lb

## 2019-06-22 DIAGNOSIS — I259 Chronic ischemic heart disease, unspecified: Secondary | ICD-10-CM

## 2019-06-22 DIAGNOSIS — Z01812 Encounter for preprocedural laboratory examination: Secondary | ICD-10-CM | POA: Diagnosis present

## 2019-06-22 DIAGNOSIS — R911 Solitary pulmonary nodule: Secondary | ICD-10-CM | POA: Diagnosis not present

## 2019-06-22 DIAGNOSIS — Z20822 Contact with and (suspected) exposure to covid-19: Secondary | ICD-10-CM | POA: Diagnosis not present

## 2019-06-22 DIAGNOSIS — R918 Other nonspecific abnormal finding of lung field: Secondary | ICD-10-CM

## 2019-06-22 LAB — COMPREHENSIVE METABOLIC PANEL
ALT: 11 IU/L (ref 0–44)
AST: 11 IU/L (ref 0–40)
Albumin/Globulin Ratio: 1.6 (ref 1.2–2.2)
Albumin: 4.6 g/dL (ref 3.8–4.8)
Alkaline Phosphatase: 52 IU/L (ref 39–117)
BUN/Creatinine Ratio: 6 — ABNORMAL LOW (ref 10–24)
BUN: 7 mg/dL — ABNORMAL LOW (ref 8–27)
Bilirubin Total: 0.6 mg/dL (ref 0.0–1.2)
CO2: 21 mmol/L (ref 20–29)
Calcium: 10.2 mg/dL (ref 8.6–10.2)
Chloride: 106 mmol/L (ref 96–106)
Creatinine, Ser: 1.16 mg/dL (ref 0.76–1.27)
GFR calc Af Amer: 75 mL/min/{1.73_m2} (ref >59)
GFR calc non Af Amer: 65 mL/min/{1.73_m2} (ref >59)
Globulin, Total: 2.8 g/dL (ref 1.5–4.5)
Glucose: 93 mg/dL (ref 65–99)
Potassium: 5.1 mmol/L (ref 3.5–5.2)
Sodium: 141 mmol/L (ref 134–144)
Total Protein: 7.4 g/dL (ref 6.0–8.5)

## 2019-06-22 LAB — CBC
Hematocrit: 41.5 % (ref 37.5–51.0)
Hemoglobin: 14.5 g/dL (ref 13.0–17.7)
MCH: 33 pg (ref 26.6–33.0)
MCHC: 34.9 g/dL (ref 31.5–35.7)
MCV: 95 fL (ref 79–97)
Platelets: 258 10*3/uL (ref 150–450)
RBC: 4.39 x10E6/uL (ref 4.14–5.80)
RDW: 12.2 % (ref 11.6–15.4)
WBC: 8.4 10*3/uL (ref 3.4–10.8)

## 2019-06-22 NOTE — Patient Instructions (Addendum)
After Visit Summary:  We will be checking the following labs today - STAT CMET & CBC  Please go to [facility] on the first floor for a chest Xray - you may walk in. Do this today.    Medication Instructions:    Continue with your current medicines.   Get back on your Ranexa   If you need a refill on your cardiac medications before your next appointment, please call your pharmacy.     Testing/Procedures To Be Arranged:  Non contrast CT of the chest to follow up lung nodule in about 2 weeks  Cardiac catheterization  Follow-Up:   See me in about 2 weeks  See Dr. [name] per recall sometime in April.     At [facility], you and your health needs are our priority.  As part of our continuing mission to provide you with exceptional heart care, we have created designated Provider Care Teams.  These Care Teams include your primary Cardiologist (physician) and Advanced Practice Providers (APPs -  Physician Assistants and Nurse Practitioners) who all work together to provide you with the care you need, when you need it.  Special Instructions:  . Stay safe, stay home, wash your hands for at least 20 seconds and wear a mask when out in public.  . It was good to talk with you today.  . If you have any more issues - you need to come to [facility] for admission.  . Limit all your activities  Your provider has recommended a cardiac catherization.   COVID SCREENING INFORMATION: You are scheduled for your COVID screening today at __________________________. [facility] ([facility]) [address] Stay in the RIGHT lane and proceed under the brick awning (NOT the tent) and tell them you are there for pre-procedure testing. Do NOT bring any pets with you to the testing site  You are scheduled for a cardiac catheterization tomorrow February 2nd at 12noon with Dr. Burt Knack at  Please arrive at the [facility] (Main Entrance) at [facility] Stay tomorrow February 2nd at [place] parking is available.   You are allowed only one visitor in the hospital with you. Both you and your visitor must wear masks.    Special note: Every effort is made to have your procedure done on time.   Please understand that emergencies sometimes delay a scheduled   procedure.  No food after midnight on Wednesday.  May have clear liquids up until 5 AM tomorrow.  On the morning of your procedure, take your aspirin.   You may take your morning medications with a sip of water on the day of your procedure.  Please take a baby aspirin (81 mg) on the morning of your procedure - along with your Plavix.   Medications to [place] for a one night stay -- bring personal belongings.  Bring a current list of your medications and current insurance cards.  You MUST have a responsible person to drive you home. Someone MUST be with you the first 24 hours after you arrive home or your discharge will be delayed. Wear clothes that are easy to get on and off and wear slip on shoes.    Coronary Angiogram A coronary angiogram, also called coronary angiography, is an X-ray procedure used to look at the arteries in the heart. In this procedure, a dye (  contrast dye) is injected through a long, hollow tube (catheter). The catheter is about the size of a piece of cooked spaghetti and is inserted through your groin, wrist, or arm. The dye is injected into each artery, and X-rays are then taken to show if there is a blockage in the arteries of your heart.  LET [facility] CARE PROVIDER KNOW ABOUT:  Any allergies you have, including allergies to shellfish or contrast dye.    All medicines you are taking, including vitamins, herbs, eye drops, creams, and over-the-counter medicines.    Previous problems you or members of your family have had with the use of anesthetics.    Any blood disorders  you have.    Previous surgeries you have had.  History of kidney problems or failure.    Other medical conditions you have.  RISKS AND COMPLICATIONS  Generally, a coronary angiogram is a safe procedure. However, about 1 person out of 1000 can have problems that may include:  Allergic reaction to the dye.  Bleeding/bruising from the access site or other locations.  Kidney injury, especially in people with impaired kidney function.   Stroke (rare).  Heart attack (rare).  Irregular rhythms (rare)  Death (rare)  BEFORE THE PROCEDURE   Do not eat or drink anything after midnight the night before the procedure or as directed by your health care provider.    Ask your health care provider about changing or stopping your regular medicines. This is especially important if you are taking diabetes medicines or blood thinners.  PROCEDURE  You may be given a medicine to help you relax (sedative) before the procedure. This medicine is given through an intravenous (IV) access tube that is inserted into one of your veins.    The area where the catheter will be inserted will be washed and shaved. This is usually done in the groin but may be done in the fold of your arm (near your elbow) or in the wrist.     A medicine will be given to numb the area where the catheter will be inserted (local anesthetic).    The health care provider will insert the catheter into an artery. The catheter will be guided by using a special type of X-ray (fluoroscopy) of the blood vessel being examined.    A special dye will then be injected into the catheter, and X-rays will be taken. The dye will help to show where any narrowing or blockages are located in the heart arteries.     AFTER THE PROCEDURE   If the procedure is done through the leg, you will be kept in bed lying flat for several hours. You will be instructed to not bend or cross your legs.  The insertion site will be checked frequently.    The  pulse in your feet or wrist will be checked frequently.    Additional blood tests, X-rays, and an electrocardiogram may be done.       Call the [place] office at [phone] if you have any questions, problems or concerns.

## 2019-06-23 ENCOUNTER — Encounter (HOSPITAL_COMMUNITY): Payer: Self-pay | Admitting: Emergency Medicine

## 2019-06-23 ENCOUNTER — Other Ambulatory Visit: Payer: Self-pay

## 2019-06-23 ENCOUNTER — Encounter (HOSPITAL_COMMUNITY)
Admission: EM | Disposition: A | Discharge status: Home / Self Care | Payer: Self-pay | Source: Home / Self Care | Attending: Emergency Medicine

## 2019-06-23 ENCOUNTER — Ambulatory Visit (HOSPITAL_COMMUNITY): Admission: RE | Admit: 2019-06-23 | Payer: Medicare Other | Source: Home / Self Care | Admitting: Cardiovascular Disease

## 2019-06-23 ENCOUNTER — Emergency Department (HOSPITAL_COMMUNITY): Payer: Medicare Other

## 2019-06-23 ENCOUNTER — Ambulatory Visit (HOSPITAL_COMMUNITY)
Admission: EM | Admit: 2019-06-23 | Discharge: 2019-06-24 | Disposition: A | Discharge status: Home / Self Care | Payer: Medicare Other | Attending: Cardiovascular Disease | Admitting: Cardiovascular Disease

## 2019-06-23 DIAGNOSIS — Z8673 Personal history of transient ischemic attack (TIA), and cerebral infarction without residual deficits: Secondary | ICD-10-CM | POA: Insufficient documentation

## 2019-06-23 DIAGNOSIS — Z7902 Long term (current) use of antithrombotics/antiplatelets: Secondary | ICD-10-CM | POA: Insufficient documentation

## 2019-06-23 DIAGNOSIS — I259 Chronic ischemic heart disease, unspecified: Secondary | ICD-10-CM

## 2019-06-23 DIAGNOSIS — R079 Chest pain, unspecified: Secondary | ICD-10-CM | POA: Diagnosis present

## 2019-06-23 DIAGNOSIS — Z79899 Other long term (current) drug therapy: Secondary | ICD-10-CM | POA: Insufficient documentation

## 2019-06-23 DIAGNOSIS — I25110 Atherosclerotic heart disease of native coronary artery with unstable angina pectoris: Secondary | ICD-10-CM

## 2019-06-23 DIAGNOSIS — K219 Gastro-esophageal reflux disease without esophagitis: Secondary | ICD-10-CM | POA: Diagnosis not present

## 2019-06-23 DIAGNOSIS — I25118 Atherosclerotic heart disease of native coronary artery with other forms of angina pectoris: Secondary | ICD-10-CM | POA: Diagnosis present

## 2019-06-23 DIAGNOSIS — I10 Essential (primary) hypertension: Secondary | ICD-10-CM | POA: Diagnosis not present

## 2019-06-23 DIAGNOSIS — Z8249 Family history of ischemic heart disease and other diseases of the circulatory system: Secondary | ICD-10-CM | POA: Diagnosis not present

## 2019-06-23 DIAGNOSIS — Z881 Allergy status to other antibiotic agents status: Secondary | ICD-10-CM | POA: Diagnosis not present

## 2019-06-23 DIAGNOSIS — E785 Hyperlipidemia, unspecified: Secondary | ICD-10-CM | POA: Diagnosis not present

## 2019-06-23 DIAGNOSIS — Z87891 Personal history of nicotine dependence: Secondary | ICD-10-CM | POA: Insufficient documentation

## 2019-06-23 DIAGNOSIS — Z885 Allergy status to narcotic agent status: Secondary | ICD-10-CM | POA: Insufficient documentation

## 2019-06-23 DIAGNOSIS — I252 Old myocardial infarction: Secondary | ICD-10-CM | POA: Insufficient documentation

## 2019-06-23 DIAGNOSIS — Z955 Presence of coronary angioplasty implant and graft: Secondary | ICD-10-CM

## 2019-06-23 DIAGNOSIS — E7800 Pure hypercholesterolemia, unspecified: Secondary | ICD-10-CM | POA: Diagnosis not present

## 2019-06-23 HISTORY — PX: LEFT HEART CATH AND CORONARY ANGIOGRAPHY: CATH118249

## 2019-06-23 HISTORY — PX: CORONARY STENT INTERVENTION: CATH118234

## 2019-06-23 LAB — CBC
HCT: 39.4 % (ref 39.0–52.0)
Hemoglobin: 12.9 g/dL — ABNORMAL LOW (ref 13.0–17.0)
MCH: 32.4 pg (ref 26.0–34.0)
MCHC: 32.7 g/dL (ref 30.0–36.0)
MCV: 99 fL (ref 80.0–100.0)
Platelets: 226 10*3/uL (ref 150–400)
RBC: 3.98 MIL/uL — ABNORMAL LOW (ref 4.22–5.81)
RDW: 12.2 % (ref 11.5–15.5)
WBC: 7.9 10*3/uL (ref 4.0–10.5)
nRBC: 0 % (ref 0.0–0.2)

## 2019-06-23 LAB — TROPONIN I (HIGH SENSITIVITY)
Troponin I (High Sensitivity): 4 ng/L (ref <18)
Troponin I (High Sensitivity): 6 ng/L (ref <18)

## 2019-06-23 LAB — BASIC METABOLIC PANEL
Anion gap: 10 (ref 5–15)
BUN: 9 mg/dL (ref 8–23)
CO2: 23 mmol/L (ref 22–32)
Calcium: 9.4 mg/dL (ref 8.9–10.3)
Chloride: 103 mmol/L (ref 98–111)
Creatinine, Ser: 1.22 mg/dL (ref 0.61–1.24)
GFR calc Af Amer: >60 mL/min (ref >60)
GFR calc non Af Amer: >60 mL/min (ref >60)
Glucose, Bld: 98 mg/dL (ref 70–99)
Potassium: 3.8 mmol/L (ref 3.5–5.1)
Sodium: 136 mmol/L (ref 135–145)

## 2019-06-23 LAB — POCT ACTIVATED CLOTTING TIME
Activated Clotting Time: 246 seconds
Activated Clotting Time: 263 seconds

## 2019-06-23 LAB — SARS CORONAVIRUS 2 (TAT 6-24 HRS): SARS Coronavirus 2: NEGATIVE

## 2019-06-23 SURGERY — LEFT HEART CATH AND CORONARY ANGIOGRAPHY
Anesthesia: LOCAL

## 2019-06-23 MED ORDER — RANOLAZINE ER 500 MG PO TB12
500.0000 mg | ORAL_TABLET | Freq: Two times a day (BID) | ORAL | Status: DC
  Administered 2019-06-23 – 2019-06-24 (×2): 500 mg via ORAL
  Filled 2019-06-23 (×2): qty 1

## 2019-06-23 MED ORDER — OXYCODONE HCL 5 MG PO TABS: 5.0000 mg | ORAL_TABLET | Freq: Four times a day (QID) | ORAL | Status: DC | PRN

## 2019-06-23 MED ORDER — FENTANYL CITRATE (PF) 100 MCG/2ML IJ SOLN
INTRAMUSCULAR | Status: AC
  Filled 2019-06-23: qty 2

## 2019-06-23 MED ORDER — SODIUM CHLORIDE 0.9 % WEIGHT BASED INFUSION: 1.0000 mL/kg/h | INTRAVENOUS | Status: AC

## 2019-06-23 MED ORDER — OXYCODONE-ACETAMINOPHEN 10-325 MG PO TABS: 1.0000 | ORAL_TABLET | Freq: Four times a day (QID) | ORAL | Status: DC | PRN

## 2019-06-23 MED ORDER — SODIUM CHLORIDE 0.9 % IV SOLN: INTRAVENOUS | Status: DC

## 2019-06-23 MED ORDER — LIDOCAINE HCL (PF) 1 % IJ SOLN
INTRAMUSCULAR | Status: AC
  Filled 2019-06-23: qty 30

## 2019-06-23 MED ORDER — ASPIRIN 81 MG PO CHEW: 81.0000 mg | CHEWABLE_TABLET | ORAL | Status: DC

## 2019-06-23 MED ORDER — VERAPAMIL HCL 2.5 MG/ML IV SOLN
INTRAVENOUS | Status: DC | PRN
  Administered 2019-06-23: 10 mL via INTRA_ARTERIAL

## 2019-06-23 MED ORDER — NITROGLYCERIN 0.4 MG SL SUBL
0.4000 mg | SUBLINGUAL_TABLET | SUBLINGUAL | Status: DC | PRN
  Administered 2019-06-23: 16:00:00 0.4 mg via SUBLINGUAL
  Filled 2019-06-23: qty 1

## 2019-06-23 MED ORDER — HYDRALAZINE HCL 20 MG/ML IJ SOLN: 10.0000 mg | INTRAMUSCULAR | Status: AC | PRN

## 2019-06-23 MED ORDER — FENTANYL CITRATE (PF) 100 MCG/2ML IJ SOLN
INTRAMUSCULAR | Status: DC | PRN
  Administered 2019-06-23 (×2): 25 ug via INTRAVENOUS
  Administered 2019-06-23: 50 ug via INTRAVENOUS
  Administered 2019-06-23 (×2): 25 ug via INTRAVENOUS

## 2019-06-23 MED ORDER — MIDAZOLAM HCL 2 MG/2ML IJ SOLN
INTRAMUSCULAR | Status: DC | PRN
  Administered 2019-06-23 (×2): 1 mg via INTRAVENOUS
  Administered 2019-06-23: 2 mg via INTRAVENOUS
  Administered 2019-06-23: 1 mg via INTRAVENOUS

## 2019-06-23 MED ORDER — LIDOCAINE HCL (PF) 1 % IJ SOLN
INTRAMUSCULAR | Status: DC | PRN
  Administered 2019-06-23: 2 mL

## 2019-06-23 MED ORDER — OXYCODONE-ACETAMINOPHEN 5-325 MG PO TABS
ORAL_TABLET | ORAL | Status: AC
  Filled 2019-06-23: qty 2

## 2019-06-23 MED ORDER — DIAZEPAM 5 MG PO TABS: 10.0000 mg | ORAL_TABLET | ORAL | Status: DC

## 2019-06-23 MED ORDER — VERAPAMIL HCL 2.5 MG/ML IV SOLN
INTRAVENOUS | Status: AC
  Filled 2019-06-23: qty 2

## 2019-06-23 MED ORDER — SODIUM CHLORIDE 0.9 % IV SOLN: 250.0000 mL | INTRAVENOUS | Status: DC | PRN

## 2019-06-23 MED ORDER — MIDAZOLAM HCL 2 MG/2ML IJ SOLN
INTRAMUSCULAR | Status: AC
  Filled 2019-06-23: qty 2

## 2019-06-23 MED ORDER — TICAGRELOR 90 MG PO TABS
90.0000 mg | ORAL_TABLET | Freq: Two times a day (BID) | ORAL | Status: DC
  Administered 2019-06-23: 22:00:00 90 mg via ORAL
  Filled 2019-06-23 (×2): qty 1

## 2019-06-23 MED ORDER — ANGIOPLASTY BOOK
Freq: Once | Status: AC
  Filled 2019-06-23: qty 1

## 2019-06-23 MED ORDER — NITROGLYCERIN 1 MG/10 ML FOR IR/CATH LAB
INTRA_ARTERIAL | Status: DC | PRN
  Administered 2019-06-23: 150 ug via INTRACORONARY
  Administered 2019-06-23: 200 ug via INTRACORONARY
  Administered 2019-06-23: 150 ug via INTRACORONARY
  Administered 2019-06-23: 200 ug via INTRACORONARY

## 2019-06-23 MED ORDER — SODIUM CHLORIDE 0.9% FLUSH
3.0000 mL | Freq: Two times a day (BID) | INTRAVENOUS | Status: DC
  Administered 2019-06-23: 3 mL via INTRAVENOUS

## 2019-06-23 MED ORDER — IOHEXOL 350 MG/ML SOLN
INTRAVENOUS | Status: DC | PRN
  Administered 2019-06-23: 175 mL via INTRA_ARTERIAL

## 2019-06-23 MED ORDER — SODIUM CHLORIDE 0.9% FLUSH: 3.0000 mL | INTRAVENOUS | Status: DC | PRN

## 2019-06-23 MED ORDER — HEPARIN (PORCINE) IN NACL 1000-0.9 UT/500ML-% IV SOLN
INTRAVENOUS | Status: AC
  Filled 2019-06-23: qty 1000

## 2019-06-23 MED ORDER — TICAGRELOR 90 MG PO TABS
ORAL_TABLET | ORAL | Status: DC | PRN
  Administered 2019-06-23: 180 mg via ORAL

## 2019-06-23 MED ORDER — TICAGRELOR 90 MG PO TABS
ORAL_TABLET | ORAL | Status: AC
  Filled 2019-06-23: qty 2

## 2019-06-23 MED ORDER — ACETAMINOPHEN 325 MG PO TABS
650.0000 mg | ORAL_TABLET | ORAL | Status: DC | PRN
  Filled 2019-06-23: qty 2

## 2019-06-23 MED ORDER — OXYCODONE-ACETAMINOPHEN 5-325 MG PO TABS
2.0000 | ORAL_TABLET | Freq: Once | ORAL | Status: AC
  Administered 2019-06-23: 2 via ORAL

## 2019-06-23 MED ORDER — NITROGLYCERIN 1 MG/10 ML FOR IR/CATH LAB
INTRA_ARTERIAL | Status: AC
  Filled 2019-06-23: qty 10

## 2019-06-23 MED ORDER — OXYCODONE-ACETAMINOPHEN 5-325 MG PO TABS: 1.0000 | ORAL_TABLET | Freq: Four times a day (QID) | ORAL | Status: DC | PRN

## 2019-06-23 MED ORDER — HEPARIN SODIUM (PORCINE) 1000 UNIT/ML IJ SOLN
INTRAMUSCULAR | Status: DC | PRN
  Administered 2019-06-23 (×2): 5000 [IU] via INTRAVENOUS
  Administered 2019-06-23: 2000 [IU] via INTRAVENOUS

## 2019-06-23 MED ORDER — NEBIVOLOL HCL 5 MG PO TABS
2.5000 mg | ORAL_TABLET | Freq: Every day | ORAL | Status: DC
  Administered 2019-06-24: 10:00:00 2.5 mg via ORAL
  Filled 2019-06-23: qty 1

## 2019-06-23 MED ORDER — ROSUVASTATIN CALCIUM 5 MG PO TABS
5.0000 mg | ORAL_TABLET | Freq: Every day | ORAL | Status: DC
  Administered 2019-06-24: 10:00:00 5 mg via ORAL
  Filled 2019-06-23: qty 1

## 2019-06-23 MED ORDER — THE SENSUOUS HEART BOOK
Freq: Once | Status: AC
  Filled 2019-06-23: qty 1

## 2019-06-23 MED ORDER — LABETALOL HCL 5 MG/ML IV SOLN: 10.0000 mg | INTRAVENOUS | Status: AC | PRN

## 2019-06-23 MED ORDER — HEPARIN SODIUM (PORCINE) 1000 UNIT/ML IJ SOLN
INTRAMUSCULAR | Status: AC
  Filled 2019-06-23: qty 1

## 2019-06-23 MED ORDER — ONDANSETRON HCL 4 MG/2ML IJ SOLN: 4.0000 mg | Freq: Four times a day (QID) | INTRAMUSCULAR | Status: DC | PRN

## 2019-06-23 MED ORDER — HEPARIN (PORCINE) IN NACL 1000-0.9 UT/500ML-% IV SOLN
INTRAVENOUS | Status: DC | PRN
  Administered 2019-06-23 (×2): 500 mL

## 2019-06-23 SURGICAL SUPPLY — 18 items
BALLN SAPPHIRE 2.5X12 (BALLOONS) ×2
BALLN SAPPHIRE ~~LOC~~ 3.25X12 (BALLOONS) ×2 IMPLANT
BALLOON SAPPHIRE 2.5X12 (BALLOONS) ×1 IMPLANT
CATH 5FR JL3.5 JR4 ANG PIG MP (CATHETERS) ×2 IMPLANT
CATH LAUNCHER 5F EBU3.5 (CATHETERS) ×2 IMPLANT
DEVICE RAD COMP TR BAND LRG (VASCULAR PRODUCTS) ×2 IMPLANT
GLIDESHEATH SLEND SS 6F .021 (SHEATH) ×2 IMPLANT
GUIDEWIRE INQWIRE 1.5J.035X260 (WIRE) ×1 IMPLANT
INQWIRE 1.5J .035X260CM (WIRE) ×2
KIT ENCORE 26 ADVANTAGE (KITS) ×2 IMPLANT
KIT HEART LEFT (KITS) ×2 IMPLANT
PACK CARDIAC CATHETERIZATION (CUSTOM PROCEDURE TRAY) ×2 IMPLANT
STENT RESOLUTE ONYX 3.0X12 (Permanent Stent) ×2 IMPLANT
STENT RESOLUTE ONYX 3.0X15 (Permanent Stent) ×2 IMPLANT
SYR MEDRAD MARK 7 150ML (SYRINGE) ×2 IMPLANT
TRANSDUCER W/STOPCOCK (MISCELLANEOUS) ×2 IMPLANT
TUBING CIL FLEX 10 FLL-RA (TUBING) ×2 IMPLANT
WIRE COUGAR XT STRL 190CM (WIRE) ×4 IMPLANT

## 2019-06-23 NOTE — ED Triage Notes (Signed)
Patient got up around 4am to go to urinate, sudden onset of chest pain, center of chest, radiating to his back and down both arms.  He states that it felt like his last MI.  Patient took 5 tabs of 81mg  ASA and 2 0.4mg  nitro at home.  Patient's pain has gone from shooting and stabbing to dull in nature, 2/10 pain level at this time.  Patient is scheduled for cardiac cath today at 10am.

## 2019-06-23 NOTE — ED Provider Notes (Signed)
TIME SEEN: 5:36 AM  CHIEF COMPLAINT: Chest pain  HPI: Patient is a 69 year old male with history of hypertension, hyperlipidemia, CAD with history of MI who presents to the emergency department chest pain.  He reports that around 4 AM this morning he woke up with a tight, pressure in the center of his chest that radiated into the middle of his back and down both arms.  Did also have a sharp component.  No associated shortness of breath, nausea, vomiting, diaphoresis or dizziness.  Took 5 aspirin and 2 nitroglycerin at home and now pain is down to a 2/10.  He is scheduled for cardiac catheterization today with Dr. Burt Knack.  He has been on Ranexa.   Patient was Covid negative yesterday.  Also had a two-view chest x-ray yesterday that was negative.  ROS: See HPI Constitutional: no fever  Eyes: no drainage  ENT: no runny nose   Cardiovascular:   chest pain  Resp: no SOB  GI: no vomiting GU: no dysuria Integumentary: no rash  Allergy: no hives  Musculoskeletal: no leg swelling  Neurological: no slurred speech ROS otherwise negative  PAST MEDICAL HISTORY/PAST SURGICAL HISTORY:  Past Medical History:  Diagnosis Date  . Adenomatous polyps   . Bronchitis   . CAD in native artery 2017  . Chronic back pain   . GERD (gastroesophageal reflux disease)   . Hyperlipidemia   . Hypertension   . Internal hemorrhoids   . Low testosterone   . Myocardial infarction ([place])   . Osteopenia   . Shortness of breath dyspnea   . Spondylosis   . TIA (transient ischemic attack) 2008  . Ureterolithiasis   . Ventral hernia   . Vitamin D deficiency     MEDICATIONS:  Prior to Admission medications   Medication Sig Start Date End Date Taking? Authorizing Provider  BYSTOLIC 2.5 MG tablet TAKE 1 TABLET BY MOUTH EVERY DAY Patient taking differently: Take 2.5 mg by mouth daily.  03/09/19   Burtis Junes, NP  clopidogrel (PLAVIX) 75 MG tablet Take 1 tablet (75 mg total) by mouth daily. 03/06/19   Burtis Junes, NP  Cyanocobalamin (VITAMIN B-12) 1000 MCG SUBL Place 1 each under the tongue every morning.    [provider]  nitroGLYCERIN (NITROSTAT) 0.4 MG SL tablet Place 0.4 mg under the tongue every 5 (five) minutes as needed for chest pain.    [provider]  ondansetron (ZOFRAN-ODT) 4 MG disintegrating tablet Take 4 mg by mouth every 8 (eight) hours as needed for nausea or vomiting.  06/14/19   [provider]  oxyCODONE-acetaminophen (PERCOCET) 10-325 MG tablet Take 1 tablet by mouth every 6 (six) hours as needed for pain.  05/25/19   [provider]  RANEXA 500 MG 12 hr tablet TAKE 1 TABLET BY MOUTH TWICE A DAY Patient taking differently: Take 500 mg by mouth 2 (two) times daily.  12/29/18   Burtis Junes, NP  rosuvastatin (CRESTOR) 5 MG tablet Take 1 tablet (5 mg total) by mouth daily. 06/16/19   [name], MD    ALLERGIES:  Allergies  Allergen Reactions  . Atorvastatin Other (See Comments)    Joint pain  . Metadate Cd [Methylphenidate Hcl Er (Cd)] Other (See Comments)    Burning, GI upset, bloating  . Other Other (See Comments)    Burning, GI upset, bloating  . Praluent [Alirocumab]     Muscle pain and flu like symptoms  . Pravastatin Other (See Comments)  .  Hydrocodone Nausea Only    Can take with food  . Levofloxacin Nausea And Vomiting  . Levofloxacin In D5w Nausea And Vomiting  . Morphine And Related Nausea And Vomiting    SOCIAL HISTORY:  Social History   Tobacco Use  . Smoking status: Former Smoker    Years: 10.00    Types: Cigarettes    Quit date: 01/04/1978    Years since quitting: 41.4  . Smokeless tobacco: Never Used  Substance Use Topics  . Alcohol use: No    Alcohol/week: 0.0 standard drinks    Comment: rarely    FAMILY HISTORY: Family History  Problem Relation Age of Onset  . Heart disease Mother   . Stroke Mother   . Heart attack Mother   . Emphysema Father     EXAM: BP 120/83   Pulse (!) 52   Temp  98.1 F (36.7 C) (Oral)   Resp 18   Ht 6' (1.829 m)   Wt 98.9 kg   SpO2 97%   BMI 29.57 kg/m  CONSTITUTIONAL: Alert and oriented and responds appropriately to questions. Well-appearing; well-nourished HEAD: Normocephalic EYES: Conjunctivae clear, pupils appear equal, EOM appear intact ENT: normal nose; moist mucous membranes NECK: Supple, normal ROM CARD: RRR; S1 and S2 appreciated; no murmurs, no clicks, no rubs, no gallops RESP: Normal chest excursion without splinting or tachypnea; breath sounds clear and equal bilaterally; no wheezes, no rhonchi, no rales, no hypoxia or respiratory distress, speaking full sentences ABD/GI: Normal bowel sounds; non-distended; soft, non-tender, no rebound, no guarding, no peritoneal signs, no hepatosplenomegaly BACK:  The back appears normal EXT: Normal ROM in all joints; no deformity noted, no edema; no cyanosis SKIN: Normal color for age and race; warm; no rash on exposed skin NEURO: Moves all extremities equally PSYCH: The patient's mood and manner are appropriate.   MEDICAL DECISION MAKING: Patient here with chest pain concerning for ACS.  Scheduled for catheterization today.  EKG does not show ischemic changes.  Will obtain troponin and discuss with cardiology on-call.  ED PROGRESS: Spoke with Mendel Ryder with cardiology.  Cardiology to admit.  Cath Lab also contacted nursing staff reporting that they will try to get him into the Cath Lab at 9 AM today.  Labs unremarkable.  Troponin negative.   I reviewed all nursing notes and pertinent previous records as available.  I have interpreted any EKGs, lab and urine results, imaging (as available).    EKG Interpretation  Date/Time:  Tuesday June 23 2019 05:33:34 EST Ventricular Rate:  55 PR Interval:    QRS Duration: 99 QT Interval:  417 QTC Calculation: 399 R Axis:   55 Text Interpretation: Sinus rhythm No significant change since last tracing Confirmed by Pryor Curia [phone]) on 06/23/2019  6:14:30 AM          [name] was evaluated in Emergency Department on 06/23/2019 for the symptoms described in the history of present illness. He was evaluated in the context of the global COVID-19 pandemic, which necessitated consideration that the patient might be at risk for infection with the SARS-CoV-2 virus that causes COVID-19. Institutional protocols and algorithms that pertain to the evaluation of patients at risk for COVID-19 are in a state of rapid change based on information released by regulatory bodies including the CDC and federal and state organizations. These policies and algorithms were followed during the patient's care in the ED.  Patient was seen wearing N95, face shield, gloves.    Earmon Sherrow, Delice Bison, DO 06/23/19  [phone]  

## 2019-06-23 NOTE — ED Notes (Addendum)
R wrist clipped for cath lab procedure, consent filled out at bedside, pt does not feel like he has been fully explained procedure at this time.

## 2019-06-23 NOTE — Discharge Summary (Signed)
Discharge Summary    Patient ID: Johnny [name],  MRN: [id number], DOB/AGE: [birth year] 69 y.o.  Admit date: 06/23/2019 Discharge date: 06/24/2019  Primary Care Provider: [name] Primary Cardiologist: [name], MD  Discharge Diagnoses    Principal Problem:   Coronary artery disease involving native coronary artery of native heart with unstable angina pectoris [facility]) Active Problems:   HYPERCHOLESTEROLEMIA   Essential hypertension   Allergies Allergies  Allergen Reactions  . Atorvastatin Other (See Comments)    Joint pain  . Metadate Cd [Methylphenidate Hcl Er (Cd)] Other (See Comments)    Burning, GI upset, bloating  . Other Other (See Comments)    Burning, GI upset, bloating  . Praluent [Alirocumab]     Muscle pain and flu like symptoms  . Pravastatin Other (See Comments)  . Hydrocodone Nausea Only    Can take with food  . Levofloxacin Nausea And Vomiting  . Levofloxacin In D5w Nausea And Vomiting  . Morphine And Related Nausea And Vomiting    Diagnostic Studies/Procedures    Cath: 06/23/19  1.  Nonobstructive LAD stenosis with moderately tight distal vessel stenosis and severe diagonal branch stenosis, all unchanged from the previous study. 2.  Nonobstructive RCA stenosis, large dominant vessel with mild nonobstructive plaque in the PDA branch 3.  Severe mid to distal left circumflex stenosis, new lesion from previous study suspected plaque rupture, treated successfully with overlapping drug-eluting stents as outlined (3.0 x 12 mm resolute Onyx and 3.0 x 15 mm resolute Onyx placed more proximally in overlapping fashion) 4.  Normal LV systolic function  Recommendations: We will change the patient from clopidogrel to take ticagrelor for more potent antiplatelet therapy.  Aggressive risk reduction measures and medical therapy.  Diagnostic Dominance: Right  Intervention    _____________   History of Present Illness     69 y.o. male with PMH of HTN,  HLD, TIA, GERD, and CADwith priormyocardial infarction. Former smoker. Cardiac cath in 2017 revealed a ramus lesion at 95% stenosed; first diagonal lesion 90% stenosed; mid LAD to distal LAD lesion 50% stenosed; and left ventricular systolic function which was normal. He was managed medically.   Presented back to the hospital in 01/2018 with chest pain and was cathed - unchanged - medical management to continue. Intolerant of nitrates due to headache. Statin intolerance but on low dose Crestor.   Last seen by Mickel Baas in 01/2019 - overall stable. Had gotten off track with CV risk factor modification.   He was seen in the office on 06/22/19 by Truitt Merle. He noted more chest pain - was "every once in a while" he would have chest pain with exertion - now with more minimal activity - it was a sharp pain - clearly exertional. He was using NTG with some relief. Pretty much having these spells more recently now every day. He noted his wife and he thought that maybe this was related to taking Ranexa - so he stopped taking this as of the Friday prior to admission - but no better. Had a little bit of chest pain the day of his office visit. It was all with activity - he denied any symptoms with rest. Given his recurrent symptoms he was set up for outpatient cardiac cath.   Hospital Course     He presented to the ED the morning of his scheduled cath with ongoing chest pain. hsTn was 4>>6. He seen by Dr. Burt Knack in the ED and brought up for cardiac  cath noted above. Found to have severe m/dLcx stenosis which was new from prior cath felt to be plaque rupture treated with overlapping DESx2. Does have residual nonobstructive LAD stenosis with moderate lesion in the distal vessel, along with nonobstructive stenosis in the RCA and PDA branch. These lesions were unchanged when compared to prior study. He was switched from Plavix to Brilinta. Plan for DAPT for at least one year. Of note he has been intolerant to higher  dose statins, currently doing ok with Crestor 5mg  daily. Will plan outpatient lipid clinic referral. Worked well with cardiac rehab without recurrent chest pain.   General: Well developed, well nourished, male appearing in no acute distress. Head: Normocephalic, atraumatic.  Neck: Supple without bruits, JVD. Lungs:  Resp regular and unlabored, CTA. Heart: RRR, S1, S2, no S3, S4, or murmur; no rub. Abdomen: Soft, non-tender, non-distended with normoactive bowel sounds. No hepatomegaly. No rebound/guarding. No obvious abdominal masses. Extremities: No clubbing, cyanosis, edema. Distal pedal pulses are 2+ bilaterally. Right radial cath site stable without bruising or hematoma Neuro: Alert and oriented X 3. Moves all extremities spontaneously. Psych: Normal affect.  Johnny [name] was seen by Dr. Ellyn Hack and determined stable for discharge home. Follow up in the office has been arranged. Medications are listed below.   _____________  Discharge Vitals Blood pressure (!) 146/93, pulse (!) 55, temperature 97.8 F (36.6 C), temperature source Oral, resp. rate 16, height 6' (1.829 m), weight 94.9 kg, SpO2 100 %.  Filed Weights   06/23/19 0535 06/24/19 0504  Weight: 98.9 kg 94.9 kg    Labs & Radiologic Studies    CBC Recent Labs    06/23/19 0548 06/24/19 0400  WBC 7.9 9.7  HGB 12.9* 13.2  HCT 39.4 39.0  MCV 99.0 97.7  PLT 226 [id number]   Basic Metabolic Panel Recent Labs    06/23/19 0548 06/24/19 0400  NA 136 140  K 3.8 4.7  CL 103 108  CO2 23 24  GLUCOSE 98 90  BUN 9 10  CREATININE 1.22 1.19  CALCIUM 9.4 9.5   Liver Function Tests Recent Labs    06/22/19 1154  AST 11  ALT 11  ALKPHOS 52  BILITOT 0.6  PROT 7.4  ALBUMIN 4.6   No results for input(s): LIPASE, AMYLASE in the last 72 hours. Cardiac Enzymes No results for input(s): CKTOTAL, CKMB, CKMBINDEX, TROPONINI in the last 72 hours. BNP Invalid input(s): POCBNP D-Dimer No results for input(s): DDIMER in the last  72 hours. Hemoglobin A1C No results for input(s): HGBA1C in the last 72 hours. Fasting Lipid Panel No results for input(s): CHOL, HDL, LDLCALC, TRIG, CHOLHDL, LDLDIRECT in the last 72 hours. Thyroid Function Tests No results for input(s): TSH, T4TOTAL, T3FREE, THYROIDAB in the last 72 hours.  Invalid input(s): FREET3 _____________  DG Chest 2 View  Result Date: 06/22/2019 CLINICAL DATA:  Preoperative evaluation. EXAM: CHEST - 2 VIEW COMPARISON:  February 19, 2019 FINDINGS: Very mild, stable atelectasis is seen within the bilateral lung bases. There is no evidence of a pleural effusion or pneumothorax. The heart size and mediastinal contours are within normal limits. Multilevel degenerative changes seen throughout the thoracic spine. IMPRESSION: No active cardiopulmonary disease. Electronically Signed   By: Virgina Norfolk M.D.   On: 06/22/2019 21:10   CARDIAC CATHETERIZATION  Result Date: 06/24/2019 1.  Nonobstructive LAD stenosis with moderately tight distal vessel stenosis and severe diagonal branch stenosis, all unchanged from the previous study. 2.  Nonobstructive RCA stenosis, large dominant  vessel with mild nonobstructive plaque in the PDA branch 3.  Severe mid to distal left circumflex stenosis, new lesion from previous study suspected plaque rupture, treated successfully with overlapping drug-eluting stents as outlined (3.0 x 12 mm resolute Onyx and 3.0 x 15 mm resolute Onyx placed more proximally in overlapping fashion) 4.  Normal LV systolic function Recommendations: We will change the patient from clopidogrel to take ticagrelor for more potent antiplatelet therapy.  Aggressive risk reduction measures and medical therapy.  Disposition   Pt is being discharged home today in good condition.  Follow-up Plans & Appointments    Follow-up Information    Burtis Junes, NP Follow up on 07/07/2019.   Specialties: Nurse Practitioner, Interventional Cardiology, Cardiology, Radiology Why:  at 11:15am for your follow up appt.  Contact information: [place] [place] [address] [phone]          Discharge Instructions    Call MD for:  redness, tenderness, or signs of infection (pain, swelling, redness, odor or green/yellow discharge around incision site)   Complete by: As directed    Diet - low sodium heart healthy   Complete by: As directed    Discharge instructions   Complete by: As directed    Radial Site Care Refer to this sheet in the next few weeks. These instructions provide you with information on caring for yourself after your procedure. Your caregiver may also give you more specific instructions. Your treatment has been planned according to current medical practices, but problems sometimes occur. Call your caregiver if you have any problems or questions after your procedure. HOME CARE INSTRUCTIONS You may shower the day after the procedure.Remove the bandage (dressing) and gently wash the site with plain soap and water.Gently pat the site dry.  Do not apply powder or lotion to the site.  Do not submerge the affected site in water for 3 to 5 days.  Inspect the site at least twice daily.  Do not flex or bend the affected arm for 24 hours.  No lifting over 5 pounds (2.3 kg) for 5 days after your procedure.  Do not drive home if you are discharged the same day of the procedure. Have someone else drive you.  You may drive 24 hours after the procedure unless otherwise instructed by your caregiver.  What to expect: Any bruising will usually fade within 1 to 2 weeks.  Blood that collects in the tissue (hematoma) may be painful to the touch. It should usually decrease in size and tenderness within 1 to 2 weeks.  SEEK IMMEDIATE MEDICAL CARE IF: You have unusual pain at the radial site.  You have redness, warmth, swelling, or pain at the radial site.  You have drainage (other than a small amount of blood on the dressing).  You have chills.    You have a fever or persistent symptoms for more than 72 hours.  You have a fever and your symptoms suddenly get worse.  Your arm becomes pale, cool, tingly, or numb.  You have heavy bleeding from the site. Hold pressure on the site.   PLEASE DO NOT MISS ANY DOSES OF YOUR BRILINTA!!!!! Also keep a log of you blood pressures and bring back to your follow up appt. Please call the office with any questions.   Patients taking blood thinners should generally stay away from medicines like ibuprofen, Advil, Motrin, naproxen, and Aleve due to risk of stomach bleeding. You may take Tylenol as directed or talk to  your primary doctor about alternatives.   Increase activity slowly   Complete by: As directed      Discharge Medications     Medication List    STOP taking these medications   clopidogrel 75 MG tablet Commonly known as: PLAVIX     TAKE these medications   aspirin 81 MG EC tablet Take 1 tablet (81 mg total) by mouth daily.   Bystolic 2.5 MG tablet Generic drug: nebivolol TAKE 1 TABLET BY MOUTH EVERY DAY What changed: how much to take   nitroGLYCERIN 0.4 MG SL tablet Commonly known as: NITROSTAT Place 0.4 mg under the tongue every 5 (five) minutes as needed for chest pain.   ondansetron 4 MG disintegrating tablet Commonly known as: ZOFRAN-ODT Take 4 mg by mouth every 8 (eight) hours as needed for nausea or vomiting.   oxyCODONE-acetaminophen 10-325 MG tablet Commonly known as: PERCOCET Take 1 tablet by mouth every 6 (six) hours as needed for pain.   Ranexa 500 MG 12 hr tablet Generic drug: ranolazine TAKE 1 TABLET BY MOUTH TWICE A DAY What changed: how much to take   rosuvastatin 5 MG tablet Commonly known as: Crestor Take 1 tablet (5 mg total) by mouth daily.   ticagrelor 90 MG Tabs tablet Commonly known as: BRILINTA Take 1 tablet (90 mg total) by mouth 2 (two) times daily.   Vitamin B-12 1000 MCG Subl Place 1 each under the tongue every morning.         No                               Did the patient have a percutaneous coronary intervention (stent / angioplasty)?:  Yes.     Cath/PCI Registry Performance & Quality Measures: 1. Aspirin prescribed? - Yes 2. ADP Receptor Inhibitor (Plavix/Clopidogrel, Brilinta/Ticagrelor or Effient/Prasugrel) prescribed (includes medically managed patients)? - Yes 3. High Intensity Statin (Lipitor 40-80mg  or Crestor 20-40mg ) prescribed? - No - intolerant, referral to lipid clinic 4. For EF <40%, was ACEI/ARB prescribed? - Not Applicable (EF >/= [id number]) 5. For EF <40%, Aldosterone Antagonist (Spironolactone or Eplerenone) prescribed? - Not Applicable (EF >/= [id number]) 6. Cardiac Rehab Phase II ordered (Included Medically managed Patients)? - Yes  Outstanding Labs/Studies   Outpatient lipid clinic  Duration of Discharge Encounter   Greater than 30 minutes including physician time.  Signed, Reino Bellis NP-C 06/24/2019, 8:59 AM

## 2019-06-23 NOTE — ED Notes (Signed)
Pt holding his Ranexa tablet he failed to take this am, ok per Dr. Leonides Schanz for pt to take medication. Pt talking on telephone, rates central chest discomfort 2 or less. Pt reports pain began at 4am, he took 5 baby ASA and 2 NTG bringing pain down to 2/10

## 2019-06-23 NOTE — Progress Notes (Signed)
Pt states CP relieved at present time. BP 114/84 HR 71. Cont to monitor. Johnny [name]

## 2019-06-23 NOTE — Interval H&P Note (Signed)
History and Physical Interval Note:  06/23/2019 8:42 AM  Johnny [name]  has presented today for surgery, with the diagnosis of unstable angna.  The various methods of treatment have been discussed with the patient and family. After consideration of risks, benefits and other options for treatment, the patient has consented to  Procedure(s): LEFT HEART CATH AND CORONARY ANGIOGRAPHY (N/A) as a surgical intervention.  The patient's history has been reviewed, patient examined, no change in status, stable for surgery.  I have reviewed the patient's chart and labs.  Questions were answered to the patient's satisfaction.    The patient is independently interviewed and examined.  I discussed his case with Truitt Merle yesterday.  He was set up for outpatient heart catheterization today with possible PCI.  However, he experienced chest pain overnight when he got up to use the bathroom.  When he was back in bed his pain intensified and he asked his wife to drive him to the fire station.  EMS met him at the fire station and drove him into the emergency room.  He currently is chest pain-free.  His troponin levels are all normal.  His symptoms are concerning for unstable angina and he has a history of small vessel coronary artery disease.  The patient's physical exam is unremarkable as he is alert, oriented, in no distress, resting comfortably in bed.  Carotid upstrokes are normal without bruits.  Lung fields are clear, heart is regular rate and rhythm with no murmur gallop, abdomen is soft and nontender with no organomegaly, and extremities have no edema.  Skin is warm and dry with no rash.  I have reviewed all available lab and radiographic data.  I have also reviewed risks, indications, and alternatives to cardiac catheterization and PCI.  The patient understands and agrees to proceed.  Sherren Mocha

## 2019-06-23 NOTE — Progress Notes (Signed)
Pt cont to have 3/10 CP, same as when in cath lab. Wants to try Ntg. 1 SL Ntg given, BP 150/84, 62.

## 2019-06-24 DIAGNOSIS — Z955 Presence of coronary angioplasty implant and graft: Secondary | ICD-10-CM

## 2019-06-24 DIAGNOSIS — I259 Chronic ischemic heart disease, unspecified: Secondary | ICD-10-CM

## 2019-06-24 DIAGNOSIS — E7800 Pure hypercholesterolemia, unspecified: Secondary | ICD-10-CM

## 2019-06-24 DIAGNOSIS — I25110 Atherosclerotic heart disease of native coronary artery with unstable angina pectoris: Secondary | ICD-10-CM | POA: Diagnosis not present

## 2019-06-24 DIAGNOSIS — I10 Essential (primary) hypertension: Secondary | ICD-10-CM | POA: Diagnosis not present

## 2019-06-24 DIAGNOSIS — I252 Old myocardial infarction: Secondary | ICD-10-CM | POA: Diagnosis not present

## 2019-06-24 DIAGNOSIS — E785 Hyperlipidemia, unspecified: Secondary | ICD-10-CM | POA: Diagnosis not present

## 2019-06-24 LAB — BASIC METABOLIC PANEL
Anion gap: 8 (ref 5–15)
BUN: 10 mg/dL (ref 8–23)
CO2: 24 mmol/L (ref 22–32)
Calcium: 9.5 mg/dL (ref 8.9–10.3)
Chloride: 108 mmol/L (ref 98–111)
Creatinine, Ser: 1.19 mg/dL (ref 0.61–1.24)
GFR calc Af Amer: >60 mL/min (ref >60)
GFR calc non Af Amer: >60 mL/min (ref >60)
Glucose, Bld: 90 mg/dL (ref 70–99)
Potassium: 4.7 mmol/L (ref 3.5–5.1)
Sodium: 140 mmol/L (ref 135–145)

## 2019-06-24 LAB — CBC
HCT: 39 % (ref 39.0–52.0)
Hemoglobin: 13.2 g/dL (ref 13.0–17.0)
MCH: 33.1 pg (ref 26.0–34.0)
MCHC: 33.8 g/dL (ref 30.0–36.0)
MCV: 97.7 fL (ref 80.0–100.0)
Platelets: 231 10*3/uL (ref 150–400)
RBC: 3.99 MIL/uL — ABNORMAL LOW (ref 4.22–5.81)
RDW: 12.2 % (ref 11.5–15.5)
WBC: 9.7 10*3/uL (ref 4.0–10.5)
nRBC: 0 % (ref 0.0–0.2)

## 2019-06-24 MED ORDER — TICAGRELOR 90 MG PO TABS: 90.0000 mg | ORAL_TABLET | Freq: Two times a day (BID) | ORAL | 2 refills | Status: DC

## 2019-06-24 MED ORDER — ASPIRIN 81 MG PO TBEC: 81.0000 mg | DELAYED_RELEASE_TABLET | Freq: Every day | ORAL | 1 refills | Status: DC

## 2019-06-24 MED ORDER — ASPIRIN EC 81 MG PO TBEC
81.0000 mg | DELAYED_RELEASE_TABLET | Freq: Every day | ORAL | Status: DC
  Filled 2019-06-24: qty 1

## 2019-06-24 MED FILL — ASPIRIN LOW DOSE 81 MG TBEC: 81 | 90 days supply | Qty: 90 | Fill #0

## 2019-06-24 MED FILL — BRILINTA 90 MG TABLET: 90 | 30 days supply | Qty: 60 | Fill #0

## 2019-06-24 MED FILL — Nitroglycerin IV Soln 100 MCG/ML in D5W: INTRA_ARTERIAL | Qty: 10 | Status: AC

## 2019-06-24 NOTE — Care Management Important Message (Signed)
Important Message  Patient Details  Name: Johnny [name] MRN: [id number] Date of Birth: Aug 07, 1950   Medicare Important Message Given:  Yes     Shelda Altes 06/24/2019, 11:49 AM

## 2019-06-24 NOTE — Progress Notes (Signed)
CARDIAC REHAB PHASE I   PRE:  Rate/Rhythm: 71 SR  BP:  Supine:   Sitting: 127/65  Standing:    SaO2: 98%RA  MODE:  Ambulation: 800 ft   POST:  Rate/Rhythm: 91 SR  BP:  Supine:   Sitting: 163/85  Standing:    SaO2: 99%RA [id number] Pt walked 800 ft on RA with steady gait and no CP. Tolerated well. BP a little elevated after walk. Reviewed NTG use, importance of brilinta with stent(has med in room), heart healthy food choices and walking for ex. Discussed CRP 2 and referred to [facility] program. Pt voiced understanding of ed.  Pt is interested in participating in Virtual Cardiac and Pulmonary Rehab. Pt advised that Virtual Cardiac and Pulmonary Rehab is provided at no cost to the patient.  Checklist:  1. Pt has smart device  ie smartphone and/or ipad for downloading an app  Yes 2. Reliable internet/wifi service    yes  3.  4. Understands how to use their smartphone and navigate within an app.  Yes   Pt verbalized understanding and is in agreement.    Graylon Good, RN BSN  06/24/2019 9:32 AM

## 2019-06-24 NOTE — Care Management (Signed)
Per Trellis Paganini W/[facility] pharmacy help desk. Co-pay amount for Brilinta 90 mg.for a 30 day supply bid, $25.00. at retail pharmacy.  Co-pay for a 90 day supply of Brilinta 90 mg.bid $50.00 [facility] mail order pharmacy..  No PA required Deductible not met. Tier 2 medication Retail pharmacy: [facility].

## 2019-06-25 ENCOUNTER — Telehealth: Payer: Self-pay | Admitting: *Deleted

## 2019-06-25 NOTE — Telephone Encounter (Signed)
S/w pt and pt's wife per (DPR).  Called to advise of CXR results.  Pt is having SOB/smothering due to new start of Brilinta.  Advised with Cecille Rubin pt is to have caffeine, either coffee or soda.  If this does not help family will call office back.  Cecille Rubin does not want pt to go off of [place].  Pt was previously on plavix.

## 2019-06-26 ENCOUNTER — Encounter (HOSPITAL_COMMUNITY): Payer: Self-pay | Admitting: *Deleted

## 2019-06-26 NOTE — Progress Notes (Signed)
Referral received and verified for MD signature.  Follow up appt is 2/16.  Due to the senior leadership directed instructions to pause onsite cardiac rehab in response to the surge of Covid + patients within the health system staff were deployed to support health system and community needs.  Will wait to check pt insurance benefits and eligibility once onsite cardiac rehab is permitted to reopen. Pt will be contacted for scheduling Virtual [facility] upon satisfactorily completing their follow up appt on 2/16.  Cherre Huger, BSN Cardiac and Training and development officer

## 2019-07-02 NOTE — Progress Notes (Signed)
CARDIOLOGY OFFICE NOTE  Date:  07/07/2019    [name] Date of Birth: 07/02/1950 Medical Record [id number]  PCP:  [name], MD  Cardiologist:  Gillian Shields    Chief Complaint  Patient presents with  . Follow-up    Seen for Dr. [name]    History of Present Illness: ROSENDO [name] is a 69 y.o. male who presents today for a post cath/post admission visit. Seen for Dr. [name].   He has a history of HTN, HLD, TIA, GERD, and CADwith priormyocardial infarction. Former smoker. Cardiac cath in 2017 revealed a ramus lesion at 95% stenosed; first diagonal lesion 90% stenosed; mid LAD to distal LAD lesion 50% stenosed; and left ventricular systolic function which was normal. He was managed medically.   Presented back to the hospital in 01/2018 with chest pain and was cathed - unchanged - medical management to continue. Intolerant of nitrates due to headache. Statin intolerance but on low dose Crestor.   Last seen by Mickel Baas in 01/2019 - overall stable. Had gotten off track with CV risk factor modification. I then saw him earlier this month - having more chest pain - cardiac cath was arranged - he ended up going to the ER the night prior due to onset of symptoms at rest.   He was admitted - underwent cath the following morning and was found to have severe m/dLcx stenosis which was new from prior cath felt to be plaque rupture treated with overlapping DESx2. Does have residual nonobstructive LAD stenosis with moderate lesion in the distal vessel, along with nonobstructive stenosis in the RCA and PDA branch. These lesions were unchanged when compared to prior study. He was switched from Plavix to Brilinta. Plan for DAPT for at least one year. Of note he has been intolerant to higher dose statins, currently doing ok with Crestor 5mg  daily. To plan for outpatient lipid clinic referral. Worked well with cardiac rehab without recurrent chest pain.   He called shortly after  discharge with dyspnea - felt to be due to Brilinta - advised to take with caffeine.   Then called back 07/07/19 - "He has had CP for about 2 months, but it has progressed in the last couple weeks.  The CP ranges from a pressure to stabbing. It occurs at rest and on exertion. After much discussion, the patient reports the pain feels like "lung" pain, like his lungs are "burning." He states the pain is worse when he inhales. He also states he thinks his symptoms may be cause by stomach issues and he may need an endoscopy. He then stated this is how he felt when he had his heart attack.  The patient went to the ED 2/13 for chest pressure. He was admitted for observation and was released yesterday with instructions to keep follow-up appointments. He has an appointment with Truitt Merle tomorrow.  Reviewed NTG instructions (he has not taking any since discharge). Reviewed ED precautions.  He understands he will be called prior to visit tomorrow if Cecille Rubin has instructions prior to visit".   The patient does not have symptoms concerning for COVID-19 infection (fever, chills, cough, or new shortness of breath).   Comes in today. Here alone. He has lots of concerns. He has brought lots of papers from his recent admissions/discharges/visits. Not clear about his medicines. He has not gotten the Isordil or the Protonix yet from the drug store. He has not tolerated Imdur in the past. He says that  today he felt better - but did have some chest discomfort with coming in here - sat down and it got better. He took Mylanta yesterday and felt better. He is thinking he has something "esophagus" going on. For his follow up CT tomorrow (this was for prior lung nodule). He is back on his Ranexa. He has had complete labs.   Past Medical History:  Diagnosis Date  . Adenomatous polyps   . Bronchitis   . CAD in native artery 2017  . Chronic back pain   . GERD (gastroesophageal reflux disease)   . Hyperlipidemia   .  Hypertension   . Internal hemorrhoids   . Low testosterone   . Myocardial infarction ([place])   . Osteopenia   . Shortness of breath dyspnea   . Spondylosis   . TIA (transient ischemic attack) 2008  . Ureterolithiasis   . Ventral hernia   . Vitamin D deficiency     Past Surgical History:  Procedure Laterality Date  . BACK SURGERY     2 surgeries  . CARDIAC CATHETERIZATION N/A 09/12/2015   Procedure: Left Heart Cath and Coronary Angiography;  Surgeon: Lorretta Harp, MD;  Location: [facility];  Service: Cardiovascular;  Laterality: N/A;  . CORONARY STENT INTERVENTION N/A 06/23/2019   Procedure: CORONARY STENT INTERVENTION;  Surgeon: Sherren Mocha, MD;  Location: [facility];  Service: Cardiovascular;  Laterality: N/A;  . LEFT HEART CATH AND CORONARY ANGIOGRAPHY N/A 01/27/2018   Procedure: LEFT HEART CATH AND CORONARY ANGIOGRAPHY;  Surgeon: Jettie Booze, MD;  Location: [facility];  Service: Cardiovascular;  Laterality: N/A;  . LEFT HEART CATH AND CORONARY ANGIOGRAPHY N/A 06/23/2019   Procedure: LEFT HEART CATH AND CORONARY ANGIOGRAPHY;  Surgeon: Sherren Mocha, MD;  Location: [facility];  Service: Cardiovascular;  Laterality: N/A;     Medications: Current Meds  Medication Sig  . aspirin EC 81 MG EC tablet Take 1 tablet (81 mg total) by mouth daily.  Marland Kitchen BYSTOLIC 2.5 MG tablet TAKE 1 TABLET BY MOUTH EVERY DAY (Patient taking differently: Take 2.5 mg by mouth daily. )  . Cyanocobalamin (VITAMIN B-12) 1000 MCG SUBL Place 1 each under the tongue every morning.  . isosorbide dinitrate (ISORDIL) 10 MG tablet Take 1 tablet (10 mg total) by mouth 3 (three) times daily.  . nitroGLYCERIN (NITROSTAT) 0.4 MG SL tablet Place 0.4 mg under the tongue every 5 (five) minutes as needed for chest pain.  Marland Kitchen ondansetron (ZOFRAN-ODT) 4 MG disintegrating tablet Take 4 mg by mouth every 8 (eight) hours as needed for nausea or vomiting.   Marland Kitchen oxyCODONE-acetaminophen (PERCOCET)  10-325 MG tablet Take 1 tablet by mouth every 6 (six) hours as needed for pain.   . pantoprazole (PROTONIX) 20 MG tablet Take 1 tablet (20 mg total) by mouth daily.  Marland Kitchen RANEXA 500 MG 12 hr tablet TAKE 1 TABLET BY MOUTH TWICE A DAY (Patient taking differently: Take 500 mg by mouth 2 (two) times daily. )  . rosuvastatin (CRESTOR) 5 MG tablet Take 1 tablet (5 mg total) by mouth daily.  . ticagrelor (BRILINTA) 90 MG TABS tablet Take 1 tablet (90 mg total) by mouth 2 (two) times daily.     Allergies: Allergies  Allergen Reactions  . Atorvastatin Other (See Comments)    Joint pain  . Metadate Cd [Methylphenidate Hcl Er (Cd)] Other (See Comments)    Burning, GI upset, bloating  . Other Other (See Comments)    Burning,  GI upset, bloating  . Praluent [Alirocumab]     Muscle pain and flu like symptoms  . Pravastatin Other (See Comments)  . Hydrocodone Nausea Only    Can take with food  . Levofloxacin Nausea And Vomiting  . Levofloxacin In D5w Nausea And Vomiting  . Morphine And Related Nausea And Vomiting    Social History: The patient  reports that he quit smoking about 41 years ago. His smoking use included cigarettes. He quit after 10.00 years of use. He has never used smokeless tobacco. He reports that he does not drink alcohol or use drugs.   Family History: The patient's family history includes Emphysema in his father; Heart attack in his mother; Heart disease in his mother; Stroke in his mother.   Review of Systems: Please see the history of present illness.   All other systems are reviewed and negative.   Physical Exam: VS:  BP 122/84   Pulse 61   Ht 6' (1.829 m)   Wt 214 lb 12.8 oz (97.4 kg)   SpO2 98%   BMI 29.13 kg/m  .  BMI Body mass index is 29.13 kg/m.  Wt Readings from Last 3 Encounters:  07/07/19 214 lb 12.8 oz (97.4 kg)  06/24/19 209 lb 3.2 oz (94.9 kg)  06/22/19 219 lb (99.3 kg)    General: Pleasant. Alert and in no acute distress.   HEENT: Normal.  Neck:  Supple, no JVD, carotid bruits, or masses noted.  Cardiac: Regular rate and rhythm.  No edema.  Respiratory:  Lungs are clear to auscultation bilaterally with normal work of breathing.  GI: Soft and nontender.  MS: No deformity or atrophy. Gait and ROM intact.  Skin: Warm and dry. Color is normal.  Neuro:  Strength and sensation are intact and no gross focal deficits noted.  Psych: Alert, appropriate and with normal affect.   LABORATORY DATA:  EKG:  EKG is ordered today. This demonstrates NSR.  Lab Results  Component Value Date   WBC 6.5 07/04/2019   HGB 13.8 07/04/2019   HCT 41.1 07/04/2019   PLT 238 07/04/2019   GLUCOSE 92 07/05/2019   CHOL 225 (H) 06/11/2019   TRIG 268 (H) 06/11/2019   HDL 40 06/11/2019   LDLCALC 144 (H) 06/11/2019   ALT 11 06/22/2019   AST 11 06/22/2019   NA 137 07/05/2019   K 4.0 07/05/2019   CL 104 07/05/2019   CREATININE 1.25 (H) 07/05/2019   BUN 11 07/05/2019   CO2 23 07/05/2019   TSH 0.637 Test methodology is 3rd generation TSH 05/01/2007   PSA 0.5 06/11/2019   INR 1.06 09/12/2015   HGBA1C 5.3 06/11/2019   MICROALBUR 0.4 06/11/2019     BNP (last 3 results) No results for input(s): BNP in the last 8760 hours.  ProBNP (last 3 results) No results for input(s): PROBNP in the last 8760 hours.   Other Studies Reviewed Today:  CORONARY STENT INTERVENTION 06/23/2019  LEFT HEART CATH AND CORONARY ANGIOGRAPHY  Conclusion  1.  Nonobstructive LAD stenosis with moderately tight distal vessel stenosis and severe diagonal branch stenosis, all unchanged from the previous study. 2.  Nonobstructive RCA stenosis, large dominant vessel with mild nonobstructive plaque in the PDA branch 3.  Severe mid to distal left circumflex stenosis, new lesion from previous study suspected plaque rupture, treated successfully with overlapping drug-eluting stents as outlined (3.0 x 12 mm resolute Onyx and 3.0 x 15 mm resolute Onyx placed more proximally in overlapping  fashion) 4.  Normal LV systolic function  Recommendations: We will change the patient from clopidogrel to take ticagrelor for more potent antiplatelet therapy.  Aggressive risk reduction measures and medical therapy.    EchoStudy Conclusions9/2019  - Left ventricle: The cavity size was normal. There was moderate focal basal hypertrophy of the septum. Systolic function was normal. The estimated ejection fraction was in the range of 60% to 65%. Wall motion was normal; there were no regional wall motion abnormalities. Doppler parameters are consistent with abnormal left ventricular relaxation (grade 1 diastolic dysfunction). Doppler parameters are consistent with indeterminate ventricular filling pressure. - Aortic valve: Transvalvular velocity was within the normal range. There was no stenosis. There was no regurgitation. Valve area (VTI): 3.34 cm^2. Valve area (Vmax): 3.29 cm^2. Valve area (Vmean): 3.33 cm^2. - Mitral valve: Transvalvular velocity was within the normal range. There was no evidence for stenosis. There was trivial regurgitation. - Right ventricle: The cavity size was normal. Wall thickness was normal. Systolic function was normal. - Tricuspid valve: There was trivial regurgitation. - Pulmonary arteries: Systolic pressure was within the normal range. PA peak pressure: 29 mm Hg (S).  CT CHESTIMPRESSION9/2019: 1. Opacities in the right middle lobe, lingula, and bases are primarily atelectasis. The opacity is most prominent in the right base and a superimposed developing infiltrate is not completely excluded. However, I favor the findings are all most likely worsening atelectasis. Recommend clinical correlation and attention on follow-up. 2. There is an exophytic mass measuring 2.1 cm off the left kidney with an attenuation of 21 are 22 Hounsfield units. This mass may simply be a complicated cyst but is indeterminate on this  study. Recommend an ultrasound in an attempt to confirm the cystic nature of this mass. 3. Coronary artery calcifications. 4. Emphysematous changes in the lungs. 5. 4.5 mm nodule in the left base and 3 mm nodule in the right base. No follow-up needed if patient is low-risk. Non-contrast chest CT can be considered in 12 months if patient is high-risk. This recommendation follows the consensus statement: Guidelines for Management of Incidental Pulmonary Nodules Detected on CT Images: From the Fleischner Society 2017; Radiology 2017; 284:228-243. 6. Right nephrolithiasis with no obstruction.  Emphysema (ICD10-J43.9).   ASSESSMENT AND PLAN:  1.CAD/unstable angina - s/p recent cath - with overlapping stents in the LCX - he has continued to have chest pain - long acting nitrate and PPI to be added after this recent admission this past weekend - he has not received from his pharmacy yet and thus, has not started. Will try to obtain today and start today.  He has had prior intolerance to Imdur - hopefully he will tolerate Isordil. Will check on him in 2 weeks for a virtual visit. He is to remain on DAPT. He has follow up with Dr. [name] in early April.   2. HTN - BP is fine on his current regimen. No changes made today.   3. HLD - on statin - going to the lipid clinic later this month.   4. Abnormal CT - noted lung nodule - he is for his follow up CT tomorrow.   5. Former smoker  6. COVID-19 Education: The signs and symptoms of COVID-19 were discussed with the patient and how to seek care for testing (follow up with PCP or arrange E-visit).  The importance of social distancing, staying at home, hand hygiene and wearing a mask when out in public were discussed today.  Current medicines are reviewed with the patient today.  The  patient does not have concerns regarding medicines other than what has been noted above.  The following changes have been made:  See above.  Labs/ tests  ordered today include:    Orders Placed This Encounter  Procedures  . EKG 12-Lead     Disposition:   FU with me in 2 weeks.   Patient is agreeable to this plan and will call if any problems develop in the interim.   SignedTruitt Merle, NP  07/07/2019 11:47 AM  [facility] [address] [place], [place]  [address] Phone: [phone] Fax: [phone]

## 2019-07-04 ENCOUNTER — Emergency Department (HOSPITAL_COMMUNITY): Payer: Medicare Other

## 2019-07-04 ENCOUNTER — Observation Stay (HOSPITAL_COMMUNITY)
Admission: EM | Admit: 2019-07-04 | Discharge: 2019-07-05 | Disposition: A | Discharge status: Home / Self Care | Payer: Medicare Other | Attending: Cardiovascular Disease | Admitting: Cardiovascular Disease

## 2019-07-04 ENCOUNTER — Other Ambulatory Visit: Payer: Self-pay

## 2019-07-04 ENCOUNTER — Encounter (HOSPITAL_COMMUNITY): Payer: Self-pay | Admitting: Emergency Medicine

## 2019-07-04 DIAGNOSIS — Z87891 Personal history of nicotine dependence: Secondary | ICD-10-CM | POA: Insufficient documentation

## 2019-07-04 DIAGNOSIS — Z885 Allergy status to narcotic agent status: Secondary | ICD-10-CM | POA: Insufficient documentation

## 2019-07-04 DIAGNOSIS — E785 Hyperlipidemia, unspecified: Secondary | ICD-10-CM | POA: Insufficient documentation

## 2019-07-04 DIAGNOSIS — I252 Old myocardial infarction: Secondary | ICD-10-CM | POA: Insufficient documentation

## 2019-07-04 DIAGNOSIS — I10 Essential (primary) hypertension: Secondary | ICD-10-CM | POA: Insufficient documentation

## 2019-07-04 DIAGNOSIS — Z7902 Long term (current) use of antithrombotics/antiplatelets: Secondary | ICD-10-CM | POA: Diagnosis not present

## 2019-07-04 DIAGNOSIS — K219 Gastro-esophageal reflux disease without esophagitis: Secondary | ICD-10-CM | POA: Diagnosis not present

## 2019-07-04 DIAGNOSIS — Z20822 Contact with and (suspected) exposure to covid-19: Secondary | ICD-10-CM | POA: Insufficient documentation

## 2019-07-04 DIAGNOSIS — I208 Other forms of angina pectoris: Secondary | ICD-10-CM | POA: Diagnosis not present

## 2019-07-04 DIAGNOSIS — I6782 Cerebral ischemia: Secondary | ICD-10-CM | POA: Insufficient documentation

## 2019-07-04 DIAGNOSIS — Z7982 Long term (current) use of aspirin: Secondary | ICD-10-CM | POA: Insufficient documentation

## 2019-07-04 DIAGNOSIS — Z87442 Personal history of urinary calculi: Secondary | ICD-10-CM | POA: Diagnosis not present

## 2019-07-04 DIAGNOSIS — Z955 Presence of coronary angioplasty implant and graft: Secondary | ICD-10-CM | POA: Diagnosis not present

## 2019-07-04 DIAGNOSIS — R0789 Other chest pain: Secondary | ICD-10-CM | POA: Diagnosis present

## 2019-07-04 DIAGNOSIS — Z881 Allergy status to other antibiotic agents status: Secondary | ICD-10-CM | POA: Diagnosis not present

## 2019-07-04 DIAGNOSIS — I25119 Atherosclerotic heart disease of native coronary artery with unspecified angina pectoris: Secondary | ICD-10-CM | POA: Insufficient documentation

## 2019-07-04 DIAGNOSIS — Z79899 Other long term (current) drug therapy: Secondary | ICD-10-CM | POA: Insufficient documentation

## 2019-07-04 DIAGNOSIS — E7800 Pure hypercholesterolemia, unspecified: Secondary | ICD-10-CM | POA: Insufficient documentation

## 2019-07-04 DIAGNOSIS — I200 Unstable angina: Secondary | ICD-10-CM

## 2019-07-04 DIAGNOSIS — R079 Chest pain, unspecified: Principal | ICD-10-CM | POA: Insufficient documentation

## 2019-07-04 DIAGNOSIS — G8929 Other chronic pain: Secondary | ICD-10-CM | POA: Insufficient documentation

## 2019-07-04 DIAGNOSIS — Z8673 Personal history of transient ischemic attack (TIA), and cerebral infarction without residual deficits: Secondary | ICD-10-CM | POA: Diagnosis not present

## 2019-07-04 DIAGNOSIS — Z8249 Family history of ischemic heart disease and other diseases of the circulatory system: Secondary | ICD-10-CM | POA: Diagnosis not present

## 2019-07-04 LAB — CBC
HCT: 41.1 % (ref 39.0–52.0)
Hemoglobin: 13.8 g/dL (ref 13.0–17.0)
MCH: 33 pg (ref 26.0–34.0)
MCHC: 33.6 g/dL (ref 30.0–36.0)
MCV: 98.3 fL (ref 80.0–100.0)
Platelets: 238 10*3/uL (ref 150–400)
RBC: 4.18 MIL/uL — ABNORMAL LOW (ref 4.22–5.81)
RDW: 12 % (ref 11.5–15.5)
WBC: 6.5 10*3/uL (ref 4.0–10.5)
nRBC: 0 % (ref 0.0–0.2)

## 2019-07-04 LAB — BASIC METABOLIC PANEL
Anion gap: 10 (ref 5–15)
BUN: 10 mg/dL (ref 8–23)
CO2: 24 mmol/L (ref 22–32)
Calcium: 9.8 mg/dL (ref 8.9–10.3)
Chloride: 104 mmol/L (ref 98–111)
Creatinine, Ser: 1.22 mg/dL (ref 0.61–1.24)
GFR calc Af Amer: >60 mL/min (ref >60)
GFR calc non Af Amer: >60 mL/min (ref >60)
Glucose, Bld: 98 mg/dL (ref 70–99)
Potassium: 3.9 mmol/L (ref 3.5–5.1)
Sodium: 138 mmol/L (ref 135–145)

## 2019-07-04 LAB — TROPONIN I (HIGH SENSITIVITY)
Troponin I (High Sensitivity): 4 ng/L (ref <18)
Troponin I (High Sensitivity): 7 ng/L (ref <18)
Troponin I (High Sensitivity): 5 ng/L (ref <18)

## 2019-07-04 LAB — SARS CORONAVIRUS 2 (TAT 6-24 HRS): SARS Coronavirus 2: NEGATIVE

## 2019-07-04 LAB — HIV ANTIBODY (ROUTINE TESTING W REFLEX): HIV Screen 4th Generation wRfx: NONREACTIVE

## 2019-07-04 MED ORDER — ISOSORBIDE DINITRATE 10 MG PO TABS
10.0000 mg | ORAL_TABLET | Freq: Three times a day (TID) | ORAL | Status: DC
  Administered 2019-07-04 – 2019-07-05 (×2): 10 mg via ORAL
  Filled 2019-07-04 (×6): qty 1

## 2019-07-04 MED ORDER — ONDANSETRON HCL 4 MG/2ML IJ SOLN: 4.0000 mg | Freq: Four times a day (QID) | INTRAMUSCULAR | Status: DC | PRN

## 2019-07-04 MED ORDER — NITROGLYCERIN 0.4 MG SL SUBL: 0.4000 mg | SUBLINGUAL_TABLET | SUBLINGUAL | Status: DC | PRN

## 2019-07-04 MED ORDER — ACETAMINOPHEN 325 MG PO TABS: 650.0000 mg | ORAL_TABLET | ORAL | Status: DC | PRN

## 2019-07-04 MED ORDER — SODIUM CHLORIDE 0.9% FLUSH: 3.0000 mL | Freq: Once | INTRAVENOUS | Status: DC

## 2019-07-04 MED ORDER — ROSUVASTATIN CALCIUM 5 MG PO TABS
5.0000 mg | ORAL_TABLET | Freq: Every day | ORAL | Status: DC
  Administered 2019-07-05: 5 mg via ORAL
  Filled 2019-07-04 (×2): qty 1

## 2019-07-04 MED ORDER — NEBIVOLOL HCL 2.5 MG PO TABS
2.5000 mg | ORAL_TABLET | Freq: Every day | ORAL | Status: DC
  Administered 2019-07-05: 10:00:00 2.5 mg via ORAL
  Filled 2019-07-04: qty 1

## 2019-07-04 MED ORDER — ASPIRIN EC 81 MG PO TBEC
81.0000 mg | DELAYED_RELEASE_TABLET | Freq: Every day | ORAL | Status: DC
  Administered 2019-07-05: 10:00:00 81 mg via ORAL
  Filled 2019-07-04: qty 1

## 2019-07-04 MED ORDER — HEPARIN SODIUM (PORCINE) 5000 UNIT/ML IJ SOLN
5000.0000 [IU] | Freq: Three times a day (TID) | INTRAMUSCULAR | Status: DC
  Administered 2019-07-04 – 2019-07-05 (×3): 5000 [IU] via SUBCUTANEOUS
  Filled 2019-07-04 (×3): qty 1

## 2019-07-04 MED ORDER — TICAGRELOR 90 MG PO TABS
90.0000 mg | ORAL_TABLET | Freq: Two times a day (BID) | ORAL | Status: DC
  Administered 2019-07-04 – 2019-07-05 (×2): 90 mg via ORAL
  Filled 2019-07-04 (×2): qty 1

## 2019-07-04 MED ORDER — RANOLAZINE ER 500 MG PO TB12
500.0000 mg | ORAL_TABLET | Freq: Two times a day (BID) | ORAL | Status: DC
  Administered 2019-07-04 – 2019-07-05 (×2): 500 mg via ORAL
  Filled 2019-07-04 (×5): qty 1

## 2019-07-04 NOTE — Progress Notes (Signed)
Pt arrived from ED. Pt C/A/Ox4. Pt given CHG bath. Telebox 14 applied/ccmd notified. Vitals stable. Pt denies pain. Pt oriented to room call bell within reach.  Jerald Kief, RN

## 2019-07-04 NOTE — Progress Notes (Signed)
Called for report. RN advised she would call 4E back for report when she is available.  Jerald Kief, RN

## 2019-07-04 NOTE — ED Triage Notes (Signed)
Pt states he had cardiac stents placed 1 week ago.  Reports gradual onset of pain to center of chest after exercising that last for approx 20 min over the past couple days.  Pt reports stabbing substernal pain that started around 9am this morning while watching TV.  Reports radiation to back and bilateral arms with SOB.  Denies nausea and vomiting.  Took 5 baby ASA and 2 NTG with some relief PTA.

## 2019-07-04 NOTE — ED Provider Notes (Signed)
[place] EMERGENCY DEPARTMENT Provider Note   CSN: [id number] Arrival date & time: 07/04/19  1022     History Chief Complaint  Patient presents with  . Chest Pain    TARANCE PENRY is a 69 y.o. male.  Mr. ABBY DENIS is a 69 y/o male, with a PMH of GERD, HLD, HTN, TIA, and MI, who presents to [facility] with chest pain. His pain began this morning at 0600 while lying in bed. The pain was  Located in the center of his chest and radiated to his spine and both shoulders. Patient stated that the pain was sharp in character and "Was up there" on the pain scale. Patient states that he took a baby aspirin and two nitro which alleviated his pain. After taking his medications, his pain went down to a 2/10 with alleviation of the  Patient Patient states that he had been admitted on 06/24/19 for stenting due to his unstable angina where a stent was placed in the L distal circumflex. Patient states that he has been following the exercise regiment and medications as directed. Patient denies headaches, vision changes, nausea, vomiting, abdominal pain, constipation, or diarrhea.     Past Medical History:  Diagnosis Date  . Adenomatous polyps   . Bronchitis   . CAD in native artery 2017  . Chronic back pain   . GERD (gastroesophageal reflux disease)   . Hyperlipidemia   . Hypertension   . Internal hemorrhoids   . Low testosterone   . Myocardial infarction ([place])   . Osteopenia   . Shortness of breath dyspnea   . Spondylosis   . TIA (transient ischemic attack) 2008  . Ureterolithiasis   . Ventral hernia   . Vitamin D deficiency     Patient Active Problem List   Diagnosis Date Noted  . Atelectasis of both lungs 01/26/2018  . 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 Unstable angina ([place])   . TIA (transient ischemic attack) 01/25/2018  . Exertional dyspnea 09/23/2015  . CAP (community acquired pneumonia) 09/23/2015  . Lung nodule 09/23/2015  . Cough 09/23/2015  . Pneumonitis   .  Coronary artery disease involving native coronary artery of native heart with unstable angina pectoris ([place])   . NSTEMI (non-ST elevated myocardial infarction) ([place])   . Chest pain 09/10/2015  . 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 Osteopenia 01/20/2012  . Hx of adenomatous colonic polyps 01/20/2012  . GE reflux 01/20/2012  . Vitamin D deficiency 01/20/2012  . Low serum testosterone level 01/20/2012  . Erectile dysfunction 01/20/2012  . Impaired glucose tolerance 01/20/2012  . Chronic back pain 01/20/2012  . Depression 02/01/2011  . ATTENTION OR CONCENTRATION DEFICIT 01/11/2010  . HYPERCHOLESTEROLEMIA 11/30/2008  . Essential hypertension 11/30/2008  . DEGENERATIVE JOINT DISEASE 11/30/2008    Past Surgical History:  Procedure Laterality Date  . BACK SURGERY     2 surgeries  . CARDIAC CATHETERIZATION N/A 09/12/2015   Procedure: Left Heart Cath and Coronary Angiography;  Surgeon: Lorretta Harp, MD;  Location: [facility];  Service: Cardiovascular;  Laterality: N/A;  . CORONARY STENT INTERVENTION N/A 06/23/2019   Procedure: CORONARY STENT INTERVENTION;  Surgeon: Sherren Mocha, MD;  Location: [place] [facility];  Service: Cardiovascular;  Laterality: N/A;  . LEFT HEART CATH AND CORONARY ANGIOGRAPHY N/A 01/27/2018   Procedure: LEFT HEART CATH AND CORONARY ANGIOGRAPHY;  Surgeon: Jettie Booze, MD;  Location: [place] [facility];  Service: Cardiovascular;  Laterality: N/A;  . LEFT HEART  CATH AND CORONARY ANGIOGRAPHY N/A 06/23/2019   Procedure: LEFT HEART CATH AND CORONARY ANGIOGRAPHY;  Surgeon: Sherren Mocha, MD;  Location: [place] [facility];  Service: Cardiovascular;  Laterality: N/A;       Family History  Problem Relation Age of Onset  . Heart disease Mother   . Stroke Mother   . Heart attack Mother   . Emphysema Father     Social History   Tobacco Use  . Smoking status: Former Smoker    Years: 10.00    Types: Cigarettes    Quit date: 01/04/1978    Years since  quitting: 41.5  . Smokeless tobacco: Never Used  Substance Use Topics  . Alcohol use: No    Alcohol/week: 0.0 standard drinks    Comment: rarely  . Drug use: No    Home Medications Prior to Admission medications   Medication Sig Start Date End Date Taking? Authorizing Provider  aspirin EC 81 MG EC tablet Take 1 tablet (81 mg total) by mouth daily. 06/24/19  Yes Reino Bellis B, NP  BYSTOLIC 2.5 MG tablet TAKE 1 TABLET BY MOUTH EVERY DAY Patient taking differently: Take 2.5 mg by mouth daily.  03/09/19  Yes Burtis Junes, NP  Cyanocobalamin (VITAMIN B-12) 1000 MCG SUBL Place 1 each under the tongue every morning.   Yes [provider]  nitroGLYCERIN (NITROSTAT) 0.4 MG SL tablet Place 0.4 mg under the tongue every 5 (five) minutes as needed for chest pain.   Yes [provider]  oxyCODONE-acetaminophen (PERCOCET) 10-325 MG tablet Take 1 tablet by mouth every 6 (six) hours as needed for pain.  05/25/19  Yes [provider]  RANEXA 500 MG 12 hr tablet TAKE 1 TABLET BY MOUTH TWICE A DAY Patient taking differently: Take 500 mg by mouth 2 (two) times daily.  12/29/18  Yes Burtis Junes, NP  rosuvastatin (CRESTOR) 5 MG tablet Take 1 tablet (5 mg total) by mouth daily. 06/16/19  Yes Baxley, Cresenciano Lick, MD  ticagrelor (BRILINTA) 90 MG TABS tablet Take 1 tablet (90 mg total) by mouth 2 (two) times daily. 06/24/19  Yes Cheryln Manly, NP  ondansetron (ZOFRAN-ODT) 4 MG disintegrating tablet Take 4 mg by mouth every 8 (eight) hours as needed for nausea or vomiting.  06/14/19   [provider]    Allergies    Atorvastatin, Metadate cd [methylphenidate hcl er (cd)], Other, Praluent [alirocumab], Pravastatin, Hydrocodone, Levofloxacin, Levofloxacin in d5w, and Morphine and related  Review of Systems   Negative with exception to those found in the HPI.  Physical Exam Updated Vital Signs BP 131/81   Pulse 61   Temp 98.1 F (36.7 C) (Oral)   Resp 15   SpO2 100%    Physical Exam Constitutional:      General: He is not in acute distress.    Appearance: He is well-developed. He is not ill-appearing, toxic-appearing or diaphoretic.  HENT:     Head: Normocephalic and atraumatic.  Cardiovascular:     Rate and Rhythm: Normal rate and regular rhythm.     Heart sounds: Normal heart sounds. Heart sounds not distant. No murmur. No systolic murmur. No diastolic murmur. No friction rub. No gallop.   Pulmonary:     Effort: Pulmonary effort is normal.     Breath sounds: Normal breath sounds. No wheezing, rhonchi or rales.  Abdominal:     General: Bowel sounds are normal.     Palpations: Abdomen is soft.     Tenderness:  There is no abdominal tenderness. There is no guarding.  Musculoskeletal:     Right lower leg: No tenderness. No edema.     Left lower leg: No tenderness. No edema.  Skin:    General: Skin is warm and dry.  Neurological:     Mental Status: He is alert and oriented to person, place, and time.  Psychiatric:        Behavior: Behavior normal.     ED Results / Procedures / Treatments   Labs (all labs ordered are listed, but only abnormal results are displayed) Labs Reviewed  CBC - Abnormal; Notable for the following components:      Result Value   RBC 4.18 (*)    All other components within normal limits  SARS CORONAVIRUS 2 (TAT 6-24 HRS)  BASIC METABOLIC PANEL  HIV ANTIBODY (ROUTINE TESTING W REFLEX)  CBC  CREATININE, SERUM  TROPONIN I (HIGH SENSITIVITY)  TROPONIN I (HIGH SENSITIVITY)    EKG EKG Interpretation  Date/Time:  Saturday July 04 2019 11:32:14 EST Ventricular Rate:  56 PR Interval:    QRS Duration: 101 QT Interval:  421 QTC Calculation: 407 R Axis:   75 Text Interpretation: Sinus rhythm Borderline low voltage, extremity leads No significant change since last tracing Confirmed by Theotis Burrow [phone]) on 07/04/2019 11:33:53 AM   Radiology DG Chest 2 View  Result Date: 07/04/2019 CLINICAL DATA:  Chest pain  and shortness of breath. EXAM: CHEST - 2 VIEW COMPARISON:  06/22/2019 FINDINGS: Normal heart size. There is no pleural effusion or edema. No airspace densities identified bilaterally. Coarsened interstitial markings are noted bilaterally. IMPRESSION: No acute cardiopulmonary abnormalities. Electronically Signed   By: Kerby Moors M.D.   On: 07/04/2019 11:09    Medications Ordered in ED Medications  sodium chloride flush (NS) 0.9 % injection 3 mL (has no administration in time range)  aspirin EC tablet 81 mg (has no administration in time range)  nebivolol (BYSTOLIC) tablet 2.5 mg (has no administration in time range)  ranolazine (RANEXA) 12 hr tablet 500 mg (has no administration in time range)  rosuvastatin (CRESTOR) tablet 5 mg (has no administration in time range)  ticagrelor (BRILINTA) tablet 90 mg (has no administration in time range)  nitroGLYCERIN (NITROSTAT) SL tablet 0.4 mg (has no administration in time range)  acetaminophen (TYLENOL) tablet 650 mg (has no administration in time range)  ondansetron (ZOFRAN) injection 4 mg (has no administration in time range)  heparin injection 5,000 Units (has no administration in time range)  isosorbide dinitrate (ISORDIL) tablet 10 mg (has no administration in time range)    ED Course  I have reviewed the triage vital signs and the nursing notes.  Pertinent labs & imaging results that were available during my care of the patient were reviewed by me and considered in my medical decision making (see chart for details).  Furman Obarr is a 68 y/o male, with a PMH of GERD, HLD, HTN, who presents with a chest pain.   Patient with a history of stent placement a week ago for his L circumflex, presenting with sudden pain in the AM of 07/04/19. Pain is substernal with radiation to both arms, and denies diaphoresis or abdominal pain, or other associated symptoms. Pain is alleviated with 2 nitro. Initial concerns for angina vs MI vs stent  migration/oclusion. Will order EKG, CBC, BMP, CXR, and serial troponin.   First set of labs and imaging are back. Unremarkable CXR with additionally unrevealing CBC and BMP with a troponin  of 4. Will continue to monitor second troponin result on Dispo.   Second troponin 7. Spoke with cardiology, who wished to have patient admitted for observation for his chest pain. Patient transferred to inpatient care.     Final Clinical Impression(s) / ED Diagnoses Final diagnoses:  Unstable angina (HCC)  Chest pain, unspecified type  Patient is a 69 y/o male with HLD, HTN, and MI, stent placement a week ago. Patient states that he had chest pain at rest. Initial labs and imaging to assist in r/o pneumothorax vs angina v MI. Imaging was noncontributory with unremarkable labs. Due to recent stent placement and new onset chest pain with pain on exertion by following PT, cardiology was consulted. Cardiology recommended observation. Patient was transferred in stable condition.    Rx / DC Orders ED Discharge Orders    None       Maudie Mercury, MD 07/04/19 Benkelman, Wenda Overland, MD 07/07/19 [phone]

## 2019-07-04 NOTE — H&P (Signed)
Physician History and Physical     Patient ID: MIRL TROCCOLI MRN: [id number] DOB/AGE: 12-23-1950 69 y.o. Admit date: 07/04/2019  Primary Care Physician: [name], MD Primary Cardiologist: [name]  Active Problems:   * No active hospital problems. *   HPI:  69 y.o. with history of CAD, HTN, HLD, TIA, GERD. Had cath in 2017 and 2019 with medically Rx disease.  Admitted for angina and cath 06/23/19 by Dr Burt Knack. Had DES to mid circumflex. He has residual small vessel disease In in diagonal, Ramus and distal LAD not amenable to PCI as vessels less than 1.5 mm. Was d/c on 06/24/19. Indicates walking up / down driveway with some "angina" since d/c but pain went away with rest. This am at 9:00 had severe sharp pain in chest " like someone stabbing me with a butcher knife" Pain slowly subsided with 2 nitro and 4 baby aspirin Son took him to ER where pain has not returned. In ER ECG no acute changes CXR NAD troponin negative   Review of systems complete and found to be negative unless listed above   Past Medical History:  Diagnosis Date  . Adenomatous polyps   . Bronchitis   . CAD in native artery 2017  . Chronic back pain   . GERD (gastroesophageal reflux disease)   . Hyperlipidemia   . Hypertension   . Internal hemorrhoids   . Low testosterone   . Myocardial infarction ([place])   . Osteopenia   . Shortness of breath dyspnea   . Spondylosis   . TIA (transient ischemic attack) 2008  . Ureterolithiasis   . Ventral hernia   . Vitamin D deficiency     Family History  Problem Relation Age of Onset  . Heart disease Mother   . Stroke Mother   . Heart attack Mother   . Emphysema Father     Social History   Socioeconomic History  . Marital status: Married    Spouse name: Not on file  . Number of children: 4  . Years of education: 24  . Highest education level: Not on file  Occupational History  . Occupation: Geophysical data processor  Tobacco Use  . Smoking status: Former Smoker   Years: 10.00    Types: Cigarettes    Quit date: 01/04/1978    Years since quitting: 41.5  . Smokeless tobacco: Never Used  Substance and Sexual Activity  . Alcohol use: No    Alcohol/week: 0.0 standard drinks    Comment: rarely  . Drug use: No  . Sexual activity: Not on file  Other Topics Concern  . Not on file  Social History Narrative   Lives at home with his wife.   Right-handed.   2 cups caffeine per day.   Social Determinants of Health   Financial Resource Strain:   . Difficulty of Paying Living Expenses: Not on file  Food Insecurity:   . Worried About Charity fundraiser in the Last Year: Not on file  . Ran Out of Food in the Last Year: Not on file  Transportation Needs:   . Lack of Transportation (Medical): Not on file  . Lack of Transportation (Non-Medical): Not on file  Physical Activity:   . Days of Exercise per Week: Not on file  . Minutes of Exercise per Session: Not on file  Stress:   . Feeling of Stress : Not on file  Social Connections:   . Frequency of Communication with Friends and Family: Not on  file  . Frequency of Social Gatherings with Friends and Family: Not on file  . Attends Religious Services: Not on file  . Active Member of Clubs or Organizations: Not on file  . Attends Archivist Meetings: Not on file  . Marital Status: Not on file  Intimate Partner Violence:   . Fear of Current or Ex-Partner: Not on file  . Emotionally Abused: Not on file  . Physically Abused: Not on file  . Sexually Abused: Not on file    Past Surgical History:  Procedure Laterality Date  . BACK SURGERY     2 surgeries  . CARDIAC CATHETERIZATION N/A 09/12/2015   Procedure: Left Heart Cath and Coronary Angiography;  Surgeon: Lorretta Harp, MD;  Location: [facility] [facility];  Service: Cardiovascular;  Laterality: N/A;  . CORONARY STENT INTERVENTION N/A 06/23/2019   Procedure: CORONARY STENT INTERVENTION;  Surgeon: Sherren Mocha, MD;  Location: [facility] [place]  [facility];  Service: Cardiovascular;  Laterality: N/A;  . LEFT HEART CATH AND CORONARY ANGIOGRAPHY N/A 01/27/2018   Procedure: LEFT HEART CATH AND CORONARY ANGIOGRAPHY;  Surgeon: Jettie Booze, MD;  Location: [place] [facility];  Service: Cardiovascular;  Laterality: N/A;  . LEFT HEART CATH AND CORONARY ANGIOGRAPHY N/A 06/23/2019   Procedure: LEFT HEART CATH AND CORONARY ANGIOGRAPHY;  Surgeon: Sherren Mocha, MD;  Location: [facility];  Service: Cardiovascular;  Laterality: N/A;     (Not in a hospital admission)   Physical Exam: Blood pressure 107/75, pulse 61, temperature 98.1 F (36.7 C), temperature source Oral, resp. rate 18, SpO2 100 %.    Affect appropriate Healthy:  appears stated age 69: normal Neck supple with no adenopathy JVP normal no bruits no thyromegaly Lungs clear with no wheezing and good diaphragmatic motion Heart:  S1/S2 no murmur, no rub, gallop or click PMI normal Abdomen: benighn, BS positve, no tenderness, no AAA no bruit.  No HSM or HJR Distal pulses intact with no bruits No edema Neuro non-focal Skin warm and dry No muscular weakness  No current facility-administered medications on file prior to encounter.   Current Outpatient Medications on File Prior to Encounter  Medication Sig Dispense Refill  . aspirin EC 81 MG EC tablet Take 1 tablet (81 mg total) by mouth daily. 90 tablet 1  . BYSTOLIC 2.5 MG tablet TAKE 1 TABLET BY MOUTH EVERY DAY (Patient taking differently: Take 2.5 mg by mouth daily. ) 90 tablet 3  . Cyanocobalamin (VITAMIN B-12) 1000 MCG SUBL Place 1 each under the tongue every morning.    . nitroGLYCERIN (NITROSTAT) 0.4 MG SL tablet Place 0.4 mg under the tongue every 5 (five) minutes as needed for chest pain.    Marland Kitchen oxyCODONE-acetaminophen (PERCOCET) 10-325 MG tablet Take 1 tablet by mouth every 6 (six) hours as needed for pain.     Marland Kitchen RANEXA 500 MG 12 hr tablet TAKE 1 TABLET BY MOUTH TWICE A DAY (Patient taking differently: Take 500  mg by mouth 2 (two) times daily. ) 180 tablet 3  . rosuvastatin (CRESTOR) 5 MG tablet Take 1 tablet (5 mg total) by mouth daily. 90 tablet 0  . ticagrelor (BRILINTA) 90 MG TABS tablet Take 1 tablet (90 mg total) by mouth 2 (two) times daily. 180 tablet 2  . ondansetron (ZOFRAN-ODT) 4 MG disintegrating tablet Take 4 mg by mouth every 8 (eight) hours as needed for nausea or vomiting.       Labs:   Lab Results  Component Value  Date   WBC 6.5 07/04/2019   HGB 13.8 07/04/2019   HCT 41.1 07/04/2019   MCV 98.3 07/04/2019   PLT 238 07/04/2019    Recent Labs  Lab 07/04/19 1058  NA 138  K 3.9  CL 104  CO2 24  BUN 10  CREATININE 1.22  CALCIUM 9.8  GLUCOSE 98   Lab Results  Component Value Date   CKTOTAL 110 HEMOLYZED SPECIMEN, RESULTS MAY BE AFFECTED 05/01/2007   CKTOTAL 57 05/01/2007   CKTOTAL 62 05/01/2007   CKMB 1.0 HEMOLYZED SPECIMEN, RESULTS MAY BE AFFECTED 05/01/2007   CKMB 0.9 05/01/2007   CKMB 1.0 05/01/2007   TROPONINI <0.03 01/26/2018   TROPONINI <0.03 01/26/2018   TROPONINI <0.03 01/25/2018     Lab Results  Component Value Date   CHOL 225 (H) 06/11/2019   CHOL 197 04/24/2018   CHOL 177 01/26/2018   Lab Results  Component Value Date   HDL 40 06/11/2019   HDL 39 (L) 04/24/2018   HDL 37 (L) 01/26/2018   Lab Results  Component Value Date   LDLCALC 144 (H) 06/11/2019   LDLCALC 121 (H) 04/24/2018   LDLCALC 97 01/26/2018   Lab Results  Component Value Date   TRIG 268 (H) 06/11/2019   TRIG 258 (H) 04/24/2018   TRIG 217 (H) 01/26/2018   Lab Results  Component Value Date   CHOLHDL 5.6 (H) 06/11/2019   CHOLHDL 5.1 (H) 04/24/2018   CHOLHDL 4.8 01/26/2018   No results found for: LDLDIRECT     Radiology: DG Chest 2 View  Result Date: 07/04/2019 CLINICAL DATA:  Chest pain and shortness of breath. EXAM: CHEST - 2 VIEW COMPARISON:  06/22/2019 FINDINGS: Normal heart size. There is no pleural effusion or edema. No airspace densities identified bilaterally.  Coarsened interstitial markings are noted bilaterally. IMPRESSION: No acute cardiopulmonary abnormalities. Electronically Signed   By: Kerby Moors M.D.   On: 07/04/2019 11:09   DG Chest 2 View  Result Date: 06/22/2019 CLINICAL DATA:  Preoperative evaluation. EXAM: CHEST - 2 VIEW COMPARISON:  February 19, 2019 FINDINGS: Very mild, stable atelectasis is seen within the bilateral lung bases. There is no evidence of a pleural effusion or pneumothorax. The heart size and mediastinal contours are within normal limits. Multilevel degenerative changes seen throughout the thoracic spine. IMPRESSION: No active cardiopulmonary disease. Electronically Signed   By: Virgina Norfolk M.D.   On: 06/22/2019 21:10   CARDIAC CATHETERIZATION  Result Date: 06/24/2019 1.  Nonobstructive LAD stenosis with moderately tight distal vessel stenosis and severe diagonal branch stenosis, all unchanged from the previous study. 2.  Nonobstructive RCA stenosis, large dominant vessel with mild nonobstructive plaque in the PDA branch 3.  Severe mid to distal left circumflex stenosis, new lesion from previous study suspected plaque rupture, treated successfully with overlapping drug-eluting stents as outlined (3.0 x 12 mm resolute Onyx and 3.0 x 15 mm resolute Onyx placed more proximally in overlapping fashion) 4.  Normal LV systolic function Recommendations: We will change the patient from clopidogrel to take ticagrelor for more potent antiplatelet therapy.  Aggressive risk reduction measures and medical therapy.   EKG: NSR normal   ASSESSMENT AND PLAN:   1. CAD/Angina:  Not clear if pain is angina or not will admit for 24 hour obs. Do not think repeat cath needed as stent 2 weeks ago had nice result and other vessels known to be too small for intervention He has had headache with LA nitrates in past He is on beta  blocker and ranexa already Will add isordil 10 mg tid and see if he tolerates it. Plan to d/c home in am if pain does not  return   2. HLD:  Continue crestor    Signed: Collier Salina Nishan2/13/2021, 2:25 PM

## 2019-07-04 NOTE — Progress Notes (Signed)
   Helped enter admission order at the request of Dr. [name]: - Admit to observation. - Will trend troponin.  - Will add Isordil 10mg  three times daily.  - Continue home medications: Aspirin 81mg  daily, Bystolic 2.5mg  daily, Ranexa 500mg  twice daily, Crestor 5mg  daily, and Brilinta 90mg  twice daily.  Darreld Mclean, PA-C 07/04/2019 3:01 PM

## 2019-07-04 NOTE — ED Notes (Signed)
Took 2 NTG and 5 Baby ASA and pain eased off at 0910-- before coming to ED

## 2019-07-05 DIAGNOSIS — R079 Chest pain, unspecified: Secondary | ICD-10-CM | POA: Diagnosis not present

## 2019-07-05 LAB — BASIC METABOLIC PANEL
Anion gap: 10 (ref 5–15)
BUN: 11 mg/dL (ref 8–23)
CO2: 23 mmol/L (ref 22–32)
Calcium: 9.8 mg/dL (ref 8.9–10.3)
Chloride: 104 mmol/L (ref 98–111)
Creatinine, Ser: 1.25 mg/dL — ABNORMAL HIGH (ref 0.61–1.24)
GFR calc Af Amer: >60 mL/min (ref >60)
GFR calc non Af Amer: 60 mL/min — ABNORMAL LOW (ref >60)
Glucose, Bld: 92 mg/dL (ref 70–99)
Potassium: 4 mmol/L (ref 3.5–5.1)
Sodium: 137 mmol/L (ref 135–145)

## 2019-07-05 MED ORDER — ISOSORBIDE DINITRATE 10 MG PO TABS: 10.0000 mg | ORAL_TABLET | Freq: Three times a day (TID) | ORAL | 2 refills | Status: DC

## 2019-07-05 MED ORDER — ALUM & MAG HYDROXIDE-SIMETH 200-200-20 MG/5ML PO SUSP
15.0000 mL | Freq: Four times a day (QID) | ORAL | Status: DC | PRN
  Administered 2019-07-05: 15 mL via ORAL
  Filled 2019-07-05: qty 30

## 2019-07-05 MED ORDER — PANTOPRAZOLE SODIUM 20 MG PO TBEC: 20.0000 mg | DELAYED_RELEASE_TABLET | Freq: Every day | ORAL | 1 refills | Status: DC

## 2019-07-05 NOTE — Discharge Summary (Signed)
Discharge Summary    Patient ID: Johnny [name] MRN: [id number]; DOB: 04/10/1951  Admit date: 07/04/2019 Discharge date: 07/05/2019  Primary Care Provider: [name], MD  Primary Cardiologist: [name], MD  Primary Electrophysiologist:  None   Discharge Diagnoses    Active Problems:   Chest pain    Diagnostic Studies/Procedures    N/A _____________   History of Present Illness     Johnny [name] is a 69 y.o. male with of CAD s/p recent DES to mid circumflex on 06/23/2019, hypertension, hyperlipidemia, TIA, and GERD. He was recently admitted from 06/23/2019 to 06/24/2019 for angina. He underwent cardiac catheterization which showed severe mid to distal LCX stenosis which was successful treated with DES. Also had disease in the diagonal, ramus, and distal LAD that were not amenable to PCI and were treated medically. Since cath, he notes angina with walking up/down driveway that resolves with rest. He presented to the ED on 07/04/2019 after sudden onset of sharp pain in chest around 9am. He reported it felt like "somone stabbing me with a butcher knife." Pain slowly subsided with 2 sublingual Nitro and 4 baby Aspirin. His son transported to him the ED where he had no recurrent pain. EKG showed now acute changes and troponin was negative x3. Decision was made to admit overnight for observation to rule out MI.  Hospital Course     Consultants: None  Patient admitted overnight for MI rule out as stated above. He was initially started on IV Heparin but Troponin remained negative. He was started on Isordil 10mg  three times daily. He did develop a headache with first dose of this, but he is tolerating this morning's dose well so far. He states he had a brief episode of chest pain this morning that completely resolved with Maalox so he is wondering if this was all reflux to begin with. He has a history of GERD. Patient seen and examined by Dr. [name] this morning and felt to be stable for  discharge. Will prescribe Isordil at discharge as well as Protonix 20mg  daily due to possible GI component. Continue dual antiplatelet therapy (Aspirin/Brilinta), beta-blocker, Ranexa, and statin. Patient already has close follow-up with Truitt Merle, NP, scheduled for 07/07/2019. Medications as below.  Did the patient have an acute coronary syndrome (MI, NSTEMI, STEMI, etc) this admission?:  No                               Did the patient have a percutaneous coronary intervention (stent / angioplasty)?:  No.   _____________  Discharge Vitals Blood pressure 118/77, pulse 64, temperature 97.8 F (36.6 C), temperature source Oral, resp. rate 20, SpO2 99 %.  There were no vitals filed for this visit.  Labs & Radiologic Studies    CBC Recent Labs    07/04/19 1058  WBC 6.5  HGB 13.8  HCT 41.1  MCV 98.3  PLT 99991111   Basic Metabolic Panel Recent Labs    07/04/19 1058 07/05/19 0237  NA 138 137  K 3.9 4.0  CL 104 104  CO2 24 23  GLUCOSE 98 92  BUN 10 11  CREATININE 1.22 1.25*  CALCIUM 9.8 9.8   Liver Function Tests No results for input(s): AST, ALT, ALKPHOS, BILITOT, PROT, ALBUMIN in the last 72 hours. No results for input(s): LIPASE, AMYLASE in the last 72 hours. High Sensitivity Troponin:   Recent Labs  Lab 06/23/19 0548 06/23/19 [id number]  07/04/19 1058 07/04/19 1242 07/04/19 1644  TROPONINIHS 4 6 4 7 5     BNP Invalid input(s): POCBNP D-Dimer No results for input(s): DDIMER in the last 72 hours. Hemoglobin A1C No results for input(s): HGBA1C in the last 72 hours. Fasting Lipid Panel No results for input(s): CHOL, HDL, LDLCALC, TRIG, CHOLHDL, LDLDIRECT in the last 72 hours. Thyroid Function Tests No results for input(s): TSH, T4TOTAL, T3FREE, THYROIDAB in the last 72 hours.  Invalid input(s): FREET3 _____________  DG Chest 2 View  Result Date: 07/04/2019 CLINICAL DATA:  Chest pain and shortness of breath. EXAM: CHEST - 2 VIEW COMPARISON:  06/22/2019 FINDINGS: Normal  heart size. There is no pleural effusion or edema. No airspace densities identified bilaterally. Coarsened interstitial markings are noted bilaterally. IMPRESSION: No acute cardiopulmonary abnormalities. Electronically Signed   By: Kerby Moors M.D.   On: 07/04/2019 11:09   DG Chest 2 View  Result Date: 06/22/2019 CLINICAL DATA:  Preoperative evaluation. EXAM: CHEST - 2 VIEW COMPARISON:  February 19, 2019 FINDINGS: Very mild, stable atelectasis is seen within the bilateral lung bases. There is no evidence of a pleural effusion or pneumothorax. The heart size and mediastinal contours are within normal limits. Multilevel degenerative changes seen throughout the thoracic spine. IMPRESSION: No active cardiopulmonary disease. Electronically Signed   By: Virgina Norfolk M.D.   On: 06/22/2019 21:10   CARDIAC CATHETERIZATION  Result Date: 06/24/2019 1.  Nonobstructive LAD stenosis with moderately tight distal vessel stenosis and severe diagonal branch stenosis, all unchanged from the previous study. 2.  Nonobstructive RCA stenosis, large dominant vessel with mild nonobstructive plaque in the PDA branch 3.  Severe mid to distal left circumflex stenosis, new lesion from previous study suspected plaque rupture, treated successfully with overlapping drug-eluting stents as outlined (3.0 x 12 mm resolute Onyx and 3.0 x 15 mm resolute Onyx placed more proximally in overlapping fashion) 4.  Normal LV systolic function Recommendations: We will change the patient from clopidogrel to take ticagrelor for more potent antiplatelet therapy.  Aggressive risk reduction measures and medical therapy.  Disposition   Patient is being discharged home today in good condition.  Follow-up Plans & Appointments    Follow-up Information    Burtis Junes, NP Follow up.   Specialties: Nurse Practitioner, Interventional Cardiology, Cardiology, Radiology Why: You have a visit with Truitt Merle, NP, scheduled for Tuesday 07/07/2019  at 11:15am. Please arrive 15 minutes early for check-in. Contact information: [address] [place] [address] [phone]          Discharge Instructions    Diet - low sodium heart healthy   Complete by: As directed    Increase activity slowly   Complete by: As directed       Discharge Medications   Allergies as of 07/05/2019      Reactions   Atorvastatin Other (See Comments)   Joint pain   Metadate Cd [methylphenidate Hcl Er (cd)] Other (See Comments)   Burning, GI upset, bloating   Other Other (See Comments)   Burning, GI upset, bloating   Praluent [alirocumab]    Muscle pain and flu like symptoms   Pravastatin Other (See Comments)   Hydrocodone Nausea Only   Can take with food   Levofloxacin Nausea And Vomiting   Levofloxacin In D5w Nausea And Vomiting   Morphine And Related Nausea And Vomiting      Medication List    TAKE these medications   aspirin 81 MG EC tablet Take  1 tablet (81 mg total) by mouth daily. Notes to patient: Take tomorrow   Bystolic 2.5 MG tablet Generic drug: nebivolol TAKE 1 TABLET BY MOUTH EVERY DAY What changed: how much to take Notes to patient: Take tomorrow   isosorbide dinitrate 10 MG tablet Commonly known as: ISORDIL Take 1 tablet (10 mg total) by mouth 3 (three) times daily. Notes to patient: Take tomorrow   nitroGLYCERIN 0.4 MG SL tablet Commonly known as: NITROSTAT Place 0.4 mg under the tongue every 5 (five) minutes as needed for chest pain.   ondansetron 4 MG disintegrating tablet Commonly known as: ZOFRAN-ODT Take 4 mg by mouth every 8 (eight) hours as needed for nausea or vomiting.   oxyCODONE-acetaminophen 10-325 MG tablet Commonly known as: PERCOCET Take 1 tablet by mouth every 6 (six) hours as needed for pain.   pantoprazole 20 MG tablet Commonly known as: Protonix Take 1 tablet (20 mg total) by mouth daily. Notes to patient: Take tomorrow   Ranexa 500 MG 12 hr tablet Generic drug:  ranolazine TAKE 1 TABLET BY MOUTH TWICE A DAY What changed: how much to take Notes to patient: Take this evening @ 9 pm   rosuvastatin 5 MG tablet Commonly known as: Crestor Take 1 tablet (5 mg total) by mouth daily. Notes to patient: Take tomorrow   ticagrelor 90 MG Tabs tablet Commonly known as: BRILINTA Take 1 tablet (90 mg total) by mouth 2 (two) times daily. Notes to patient: Take this evening   Vitamin B-12 1000 MCG Subl Place 1 each under the tongue every morning.          Outstanding Labs/Studies   N/A  Duration of Discharge Encounter   Greater than 30 minutes including physician time.  Signed, Darreld Mclean, PA-C 07/05/2019, 12:06 PM

## 2019-07-05 NOTE — Progress Notes (Signed)
    Subjective:  No angina, dyspnea   Objective:  Vitals:   07/04/19 1630 07/04/19 2030 07/05/19 0440 07/05/19 0756  BP: (!) 144/91 131/83 (!) 96/59 122/80  Pulse: 60 63 (!) 58 (!) 59  Resp: 16 19 19 19   Temp:  98.9 F (37.2 C) (!) 97.5 F (36.4 C) 97.7 F (36.5 C)  TempSrc:  Oral Oral Oral  SpO2: 100% 99% 100% 99%    Intake/Output from previous day: No intake or output data in the 24 hours ending 07/05/19 0802  Physical Exam: BP 122/80 (BP Location: Right Arm)   Pulse (!) 59   Temp 97.7 F (36.5 C) (Oral)   Resp 19   SpO2 99%  Affect appropriate Healthy:  appears stated age HEENT: normal Neck supple with no adenopathy JVP normal no bruits no thyromegaly Lungs clear with no wheezing and good diaphragmatic motion Heart:  S1/S2 no murmur, no rub, gallop or click PMI normal Abdomen: benighn, BS positve, no tenderness, no AAA no bruit.  No HSM or HJR Distal pulses intact with no bruits No edema Neuro non-focal Skin warm and dry No muscular weakness   Lab Results: Basic Metabolic Panel: Recent Labs    07/04/19 1058 07/05/19 0237  NA 138 137  K 3.9 4.0  CL 104 104  CO2 24 23  GLUCOSE 98 92  BUN 10 11  CREATININE 1.22 1.25*  CALCIUM 9.8 9.8   Liver Function Tests: No results for input(s): AST, ALT, ALKPHOS, BILITOT, PROT, ALBUMIN in the last 72 hours. No results for input(s): LIPASE, AMYLASE in the last 72 hours. CBC: Recent Labs    07/04/19 1058  WBC 6.5  HGB 13.8  HCT 41.1  MCV 98.3  PLT 238    Imaging: DG Chest 2 View  Result Date: 07/04/2019 CLINICAL DATA:  Chest pain and shortness of breath. EXAM: CHEST - 2 VIEW COMPARISON:  06/22/2019 FINDINGS: Normal heart size. There is no pleural effusion or edema. No airspace densities identified bilaterally. Coarsened interstitial markings are noted bilaterally. IMPRESSION: No acute cardiopulmonary abnormalities. Electronically Signed   By: Kerby Moors M.D.   On: 07/04/2019 11:09    Cardiac  Studies:  ECG: SR normal    Telemetry: NSR no arrhythmia   Echo:   Medications:   . aspirin EC  81 mg Oral Daily  . heparin  5,000 Units Subcutaneous Q8H  . isosorbide dinitrate  10 mg Oral TID  . nebivolol  2.5 mg Oral Daily  . ranolazine  500 mg Oral BID  . rosuvastatin  5 mg Oral Daily  . sodium chloride flush  3 mL Intravenous Once  . ticagrelor  90 mg Oral BID      Assessment/Plan:   1. Chest Pain:  R/o no acute ST changes post recent stent to mid circumflex with residual small vessel disease in small OM and diagonal continue ranexa, nitrates beta blocker and DAT. He is stable and ready for d/c Has appointment with NP in am at our office  2. HLD:  On low dose statin labs with Cecille Rubin in am   [name] 07/05/2019, 8:02 AM

## 2019-07-05 NOTE — Progress Notes (Signed)
Pt provided discharge instructions and education. Pt vital stable. Pt ambulated multiple times without incident. Telebox removed/ccmd notified. IV removed and intact. Pt denies any complaints. Pt has all belongings. Pt tx via wheelchair to valet to meet ride.  Jerald Kief, RN

## 2019-07-06 ENCOUNTER — Telehealth: Payer: Self-pay | Admitting: Cardiovascular Disease

## 2019-07-06 ENCOUNTER — Ambulatory Visit: Payer: Medicare Other

## 2019-07-06 NOTE — Telephone Encounter (Signed)
New Message  Pt c/o of Chest Pain: STAT if CP now or developed within 24 hours  1. Are you having CP right now? Yes  2. Are you experiencing any other symptoms (ex. SOB, nausea, vomiting, sweating)? Headache, nausea, chest pain, back and shoulder pain,   3. How long have you been experiencing CP? Couple months, worse now  4. Is your CP continuous or coming and going? Coming and going  5. Have you taken Nitroglycerin? Not today ?

## 2019-07-06 NOTE — Telephone Encounter (Signed)
Agree with given recommendations.   Johnny [name]

## 2019-07-06 NOTE — Telephone Encounter (Signed)
He has had CP for about 2 months, but it has progressed in the last couple weeks.  The CP ranges from a pressure to stabbing. It occurs at rest and on exertion.  After much discussion, the patient reports the pain feels like "lung" pain, like his lungs are "burning." He states the pain is worse when he inhales.  He also states he thinks his symptoms may be cause by stomach issues and he may need an endoscopy.  He then stated this is how he felt when he had his heart attack.  The patient went to the ED 2/13 for chest pressure. He was admitted for observation and was released yesterday with instructiones to keep follow-up appointments.   He has an appointment with Truitt Merle tomorrow. Reviewed NTG instructions (he has not taking any since discharge). Reviewed ED precautions. He understands he will be called prior to visit tomorrow if Cecille Rubin has instructions prior to visit.

## 2019-07-07 ENCOUNTER — Other Ambulatory Visit: Payer: Self-pay

## 2019-07-07 ENCOUNTER — Ambulatory Visit: Payer: Medicare Other | Admitting: Nurse Practitioner

## 2019-07-07 ENCOUNTER — Telehealth: Payer: Self-pay | Admitting: *Deleted

## 2019-07-07 ENCOUNTER — Encounter: Payer: Self-pay | Admitting: Nurse Practitioner

## 2019-07-07 VITALS — BP 122/84 | HR 61 | Ht 72.0 in | Wt 214.8 lb

## 2019-07-07 DIAGNOSIS — Z9889 Other specified postprocedural states: Secondary | ICD-10-CM

## 2019-07-07 DIAGNOSIS — I259 Chronic ischemic heart disease, unspecified: Secondary | ICD-10-CM

## 2019-07-07 DIAGNOSIS — I2510 Atherosclerotic heart disease of native coronary artery without angina pectoris: Secondary | ICD-10-CM | POA: Diagnosis not present

## 2019-07-07 DIAGNOSIS — Z955 Presence of coronary angioplasty implant and graft: Secondary | ICD-10-CM

## 2019-07-07 DIAGNOSIS — I10 Essential (primary) hypertension: Secondary | ICD-10-CM

## 2019-07-07 DIAGNOSIS — E7800 Pure hypercholesterolemia, unspecified: Secondary | ICD-10-CM

## 2019-07-07 DIAGNOSIS — E7849 Other hyperlipidemia: Secondary | ICD-10-CM

## 2019-07-07 NOTE — Patient Instructions (Addendum)
After Visit Summary:  We will be checking the following labs today - NONE   Medication Instructions:    Continue with your current medicines.   Go by the pharmacy and pick up the new medicines and start those today please.    If you need a refill on your cardiac medications before your next appointment, please call your pharmacy.     Testing/Procedures To Be Arranged:  CT scan tomorrow at [facility].   Follow-Up:   Let's do a virtual visit in 2 weeks  See Dr. [name] in April.   See the pharmacist here later this month.     At [facility], you and your health needs are our priority.  As part of our continuing mission to provide you with exceptional heart care, we have created designated Provider Care Teams.  These Care Teams include your primary Cardiologist (physician) and Advanced Practice Providers (APPs -  Physician Assistants and Nurse Practitioners) who all work together to provide you with the care you need, when you need it.  Special Instructions:  . Stay safe, stay home, wash your hands for at least 20 seconds and wear a mask when out in public.  . It was good to talk with you today.  . Call Dr. Renold Genta and check on your labs - you may not need both of her appointments.    Call the [place] office at [phone] if you have any questions, problems or concerns.

## 2019-07-07 NOTE — Telephone Encounter (Signed)

## 2019-07-08 ENCOUNTER — Ambulatory Visit (HOSPITAL_COMMUNITY)
Admission: RE | Admit: 2019-07-08 | Discharge: 2019-07-08 | Disposition: A | Discharge status: Home / Self Care | Payer: Medicare Other | Source: Ambulatory Visit | Attending: Nurse Practitioner | Admitting: Nurse Practitioner

## 2019-07-08 ENCOUNTER — Telehealth (HOSPITAL_COMMUNITY): Payer: Self-pay

## 2019-07-08 DIAGNOSIS — R918 Other nonspecific abnormal finding of lung field: Secondary | ICD-10-CM

## 2019-07-08 NOTE — Telephone Encounter (Signed)
Pt insurance is active and benefits verified through [facility] Medicare Co-pay 0, DED 0/0 met, out of pocket $3,200/$234.52 met, co-insurance 10%. no pre-authorization required. Passport, 07/08/2019@9 :00am, REF# [id number]  Will contact patient to see if he is interested in the Cardiac Rehab Program. If interested, patient will need to complete follow up appt. Once completed, patient will be contacted for scheduling upon review by the RN Navigator.

## 2019-07-13 NOTE — Patient Instructions (Signed)
Start Crestor 5 mg daily and follow-up in March.  Try to get more exercise.

## 2019-07-14 ENCOUNTER — Telehealth: Payer: Self-pay | Admitting: Internal Medicine

## 2019-07-14 NOTE — Telephone Encounter (Signed)
Johnny [name] [phone]  Sheryl called to say that Johnny [name] is going to have labs for the [facility] tomorrow and start going to [facility], so Dr [name] said they should cancel labs and OV for March because they will be taking care of that at the lipid clinic. I have canceled labs, however would you still like to see him for office visit?

## 2019-07-14 NOTE — Telephone Encounter (Signed)
Appointment canceled.

## 2019-07-14 NOTE — Telephone Encounter (Signed)
Cancel visit here.

## 2019-07-15 ENCOUNTER — Other Ambulatory Visit: Payer: Self-pay

## 2019-07-15 ENCOUNTER — Ambulatory Visit (INDEPENDENT_AMBULATORY_CARE_PROVIDER_SITE_OTHER): Payer: Medicare Other | Admitting: Pharmacist

## 2019-07-15 DIAGNOSIS — E7800 Pure hypercholesterolemia, unspecified: Secondary | ICD-10-CM

## 2019-07-15 LAB — LIPID PANEL
Chol/HDL Ratio: 3.9 ratio (ref 0.0–5.0)
Cholesterol, Total: 146 mg/dL (ref 100–199)
HDL: 37 mg/dL — ABNORMAL LOW (ref >39)
LDL Chol Calc (NIH): 71 mg/dL (ref 0–99)
Triglycerides: 232 mg/dL — ABNORMAL HIGH (ref 0–149)
VLDL Cholesterol Cal: 38 mg/dL (ref 5–40)

## 2019-07-15 LAB — HEPATIC FUNCTION PANEL
ALT: 24 IU/L (ref 0–44)
AST: 18 IU/L (ref 0–40)
Albumin: 4.6 g/dL (ref 3.8–4.8)
Alkaline Phosphatase: 57 IU/L (ref 39–117)
Bilirubin Total: 0.8 mg/dL (ref 0.0–1.2)
Bilirubin, Direct: 0.22 mg/dL (ref 0.00–0.40)
Total Protein: 7.4 g/dL (ref 6.0–8.5)

## 2019-07-15 MED ORDER — ROSUVASTATIN CALCIUM 10 MG PO TABS: 10.0000 mg | ORAL_TABLET | Freq: Every day | ORAL | 3 refills | Status: DC

## 2019-07-15 NOTE — Progress Notes (Signed)
Telehealth Visit     Virtual Visit via Video Note   This visit type was conducted due to national recommendations for restrictions regarding the COVID-19 Pandemic (e.g. social distancing) in an effort to limit this patient's exposure and mitigate transmission in our community.  Due to his co-morbid illnesses, this patient is at least at moderate risk for complications without adequate follow up.  This format is felt to be most appropriate for this patient at this time.  All issues noted in this document were discussed and addressed.  A limited physical exam was performed with this format.  Please refer to the patient's chart for his consent to telehealth for [facility].   Evaluation Performed:  Follow-up visit  This visit type was conducted due to national recommendations for restrictions regarding the COVID-19 Pandemic (e.g. social distancing).  This format is felt to be most appropriate for this patient at this time.  All issues noted in this document were discussed and addressed.  No physical exam was performed (except for noted visual exam findings with Video Visits).  Please refer to the patient's chart (MyChart message for video visits and phone note for telephone visits) for the patient's consent to telehealth for [facility].  Date:  07/22/2019   ID:  ALAKI ROSEL, DOB 1950/08/27, MRN [id number]  Patient Location:  Home  Provider location:   Home  PCP:  [name], MD  Cardiologist:  Servando Snare & [name], MD  Electrophysiologist:  None   Chief Complaint:  Follow up  History of Present Illness:    KENTAVIS BOCIAN is a 69 y.o. male who presents via audio/video conferencing for a telehealth visit today.  Seen for Dr. [name].    He has a history of HTN, HLD,TIA, GERD,andCADwith priormyocardial infarction. Former smoker.Cardiac cath in 2017 revealed a ramus lesion at 95% stenosed; first diagonal lesion 90% stenosed; mid LAD to distal LAD lesion 50% stenosed;  and left ventricular systolic function which was normal. Hewasmanaged medically.  Presented back to the hospital in 01/2018 with chest pain and was cathed - unchanged - medical management to continue. Intolerant of nitrates due to headache. Statin intolerance but on low dose Crestor.   Last seen by Mickel Baas in 01/2019 - overall stable. Had gotten off track with CV risk factor modification.I then saw him earlier last month - having more chest pain - cardiac cath was arranged - he ended up going to the ER the night prior due to onset of symptoms at rest.   He was admitted - underwent cath the following morning and was found to have severe m/dLcx stenosis which was new from prior cath felt to be plaque rupture treated with overlapping DESx2. Does have residual nonobstructive LAD stenosis with moderate lesion in the distal vessel, along with nonobstructive stenosis in the RCA and PDA branch. These lesions were unchanged when compared to prior study. He was switched from Plavix to Brilinta. Plan for DAPT for at least one year. Of note he has been intolerant to higher dose statins, currently doing ok with Crestor 5mg  daily. To plan for outpatient lipid clinic referral. Worked well with cardiac rehab without recurrent chest pain.  He called shortly after discharge with dyspnea - felt to be due to Brilinta - advised to take with caffeine. Then called back with a multitude of issues. I saw him back about 2 weeks ago - he had lots of concerns. His medicines were not clear - he had not picked up his PPI  or nitrate from his last discharge. He was using Mylanta for chest pain with relief - he thought something "GI" was going on with him.   He did go to the lipid clinic - noted that he took one dose of the nitrate - had a headache and stopped. Did not start the PPI - said he was not having reflux. Very hesitant to start additional lipid lowering agents.   The patient does not have symptoms concerning for  COVID-19 infection (fever, chills, cough, or new shortness of breath).   Seen today by telephone visit. He has consented for this visit. He declined video. He notes that he is doing much better. Says he is "a lot better". He has not had anymore chest pain. He is not taking the nitrate therapy. He is back exercising and walking outside - does fine with this. He feels like his BP is doing ok. Not dizzy or lightheaded. His indigestion is gone - watching his coffee and stopped "all the crap" he was eating.   Past Medical History:  Diagnosis Date  . Adenomatous polyps   . Bronchitis   . CAD in native artery 2017  . Chronic back pain   . GERD (gastroesophageal reflux disease)   . Hyperlipidemia   . Hypertension   . Internal hemorrhoids   . Low testosterone   . Myocardial infarction ([place])   . Osteopenia   . Shortness of breath dyspnea   . Spondylosis   . TIA (transient ischemic attack) 2008  . Ureterolithiasis   . Ventral hernia   . Vitamin D deficiency    Past Surgical History:  Procedure Laterality Date  . BACK SURGERY     2 surgeries  . CARDIAC CATHETERIZATION N/A 09/12/2015   Procedure: Left Heart Cath and Coronary Angiography;  Surgeon: Lorretta Harp, MD;  Location: [facility];  Service: Cardiovascular;  Laterality: N/A;  . CORONARY STENT INTERVENTION N/A 06/23/2019   Procedure: CORONARY STENT INTERVENTION;  Surgeon: Sherren Mocha, MD;  Location: [facility];  Service: Cardiovascular;  Laterality: N/A;  . LEFT HEART CATH AND CORONARY ANGIOGRAPHY N/A 01/27/2018   Procedure: LEFT HEART CATH AND CORONARY ANGIOGRAPHY;  Surgeon: Jettie Booze, MD;  Location: [place] [facility];  Service: Cardiovascular;  Laterality: N/A;  . LEFT HEART CATH AND CORONARY ANGIOGRAPHY N/A 06/23/2019   Procedure: LEFT HEART CATH AND CORONARY ANGIOGRAPHY;  Surgeon: Sherren Mocha, MD;  Location: [place] [facility];  Service: Cardiovascular;  Laterality: N/A;     Current Meds  Medication  Sig  . aspirin EC 81 MG EC tablet Take 1 tablet (81 mg total) by mouth daily.  . Cyanocobalamin (VITAMIN B-12) 1000 MCG SUBL Place 1 each under the tongue every morning.  . nebivolol (BYSTOLIC) 2.5 MG tablet Take 2.5 mg by mouth daily.  . nitroGLYCERIN (NITROSTAT) 0.4 MG SL tablet Place 0.4 mg under the tongue every 5 (five) minutes as needed for chest pain.  Marland Kitchen oxyCODONE-acetaminophen (PERCOCET) 10-325 MG tablet Take 1 tablet by mouth every 6 (six) hours as needed for pain.   . pantoprazole (PROTONIX) 20 MG tablet Take 1 tablet (20 mg total) by mouth daily.  . ranolazine (RANEXA) 500 MG 12 hr tablet Take 500 mg by mouth 2 (two) times daily.  . rosuvastatin (CRESTOR) 10 MG tablet Take 1 tablet (10 mg total) by mouth daily.  . ticagrelor (BRILINTA) 90 MG TABS tablet Take 1 tablet (90 mg total) by mouth 2 (two) times daily.  . [  DISCONTINUED] BYSTOLIC 2.5 MG tablet TAKE 1 TABLET BY MOUTH EVERY DAY (Patient taking differently: Take 2.5 mg by mouth daily. )  . [DISCONTINUED] RANEXA 500 MG 12 hr tablet TAKE 1 TABLET BY MOUTH TWICE A DAY (Patient taking differently: Take 500 mg by mouth 2 (two) times daily. )  . [DISCONTINUED] ticagrelor (BRILINTA) 90 MG TABS tablet Take 1 tablet (90 mg total) by mouth 2 (two) times daily.     Allergies:   Atorvastatin, Metadate cd [methylphenidate hcl er (cd)], Other, Praluent [alirocumab], Pravastatin, Hydrocodone, Levofloxacin, Levofloxacin in d5w, and Morphine and related   Social History   Tobacco Use  . Smoking status: Former Smoker    Years: 10.00    Types: Cigarettes    Quit date: 01/04/1978    Years since quitting: 41.5  . Smokeless tobacco: Never Used  Substance Use Topics  . Alcohol use: No    Alcohol/week: 0.0 standard drinks    Comment: rarely  . Drug use: No     Family Hx: The patient's family history includes Emphysema in his father; Heart attack in his mother; Heart disease in his mother; Stroke in his mother.  ROS:   Please see the  history of present illness.   All other systems reviewed are negative.    Objective:    Vital Signs:  BP (!) 143/81   Pulse (!) 57   Ht 6' (1.829 m)   Wt 214 lb (97.1 kg)   BMI 29.02 kg/m    Wt Readings from Last 3 Encounters:  07/22/19 214 lb (97.1 kg)  07/07/19 214 lb 12.8 oz (97.4 kg)  06/24/19 209 lb 3.2 oz (94.9 kg)    Alert male in no acute distress. Appropriate in conversation. He sounds good. Not short of breath.    Labs/Other Tests and Data Reviewed:    Lab Results  Component Value Date   WBC 6.5 07/04/2019   HGB 13.8 07/04/2019   HCT 41.1 07/04/2019   PLT 238 07/04/2019   GLUCOSE 92 07/05/2019   CHOL 146 07/15/2019   TRIG 232 (H) 07/15/2019   HDL 37 (L) 07/15/2019   LDLCALC 71 07/15/2019   ALT 24 07/15/2019   AST 18 07/15/2019   NA 137 07/05/2019   K 4.0 07/05/2019   CL 104 07/05/2019   CREATININE 1.25 (H) 07/05/2019   BUN 11 07/05/2019   CO2 23 07/05/2019   TSH 0.637 Test methodology is 3rd generation TSH 05/01/2007   PSA 0.5 06/11/2019   INR 1.06 09/12/2015   HGBA1C 5.3 06/11/2019   MICROALBUR 0.4 06/11/2019     BNP (last 3 results) No results for input(s): BNP in the last 8760 hours.  ProBNP (last 3 results) No results for input(s): PROBNP in the last 8760 hours.    Prior CV studies:    The following studies were reviewed today:  CT CHEST IMPRESSION 06/2019: Small bilateral pulmonary nodules, 4 mm. The left lower lobe nodule is stable since prior study. The right middle lobe nodule was difficult to see on prior study due to adjacent atelectasis. Repeat study in 1 year could be reformed to ensure 2 years of stability.  Old granulomatous disease.  Coronary artery disease.  Aortic Atherosclerosis (ICD10-I70.0) and Emphysema (ICD10-J43.9).   Electronically Signed   By: Rolm Baptise M.D.   On: 07/09/2019 09:33  CORONARY STENT INTERVENTION 06/23/2019  LEFT HEART CATH AND CORONARY ANGIOGRAPHY  Conclusion  1. Nonobstructive  LAD stenosis with moderately tight distal vessel stenosis and severe diagonal branch  stenosis, all unchanged from the previous study. 2. Nonobstructive RCA stenosis, large dominant vessel with mild nonobstructive plaque in the PDA branch 3. Severe mid to distal left circumflex stenosis, new lesion from previous study suspected plaque rupture, treated successfully with overlapping drug-eluting stents as outlined (3.0 x 12 mm resolute Onyx and 3.0 x 15 mm resolute Onyx placed more proximally in overlapping fashion) 4. Normal LV systolic function  Recommendations: We will change the patient from clopidogrel to take ticagrelor for more potent antiplatelet therapy. Aggressive risk reduction measures and medical therapy.    EchoStudy Conclusions9/2019  - Left ventricle: The cavity size was normal. There was moderate focal basal hypertrophy of the septum. Systolic function was normal. The estimated ejection fraction was in the range of 60% to 65%. Wall motion was normal; there were no regional wall motion abnormalities. Doppler parameters are consistent with abnormal left ventricular relaxation (grade 1 diastolic dysfunction). Doppler parameters are consistent with indeterminate ventricular filling pressure. - Aortic valve: Transvalvular velocity was within the normal range. There was no stenosis. There was no regurgitation. Valve area (VTI): 3.34 cm^2. Valve area (Vmax): 3.29 cm^2. Valve area (Vmean): 3.33 cm^2. - Mitral valve: Transvalvular velocity was within the normal range. There was no evidence for stenosis. There was trivial regurgitation. - Right ventricle: The cavity size was normal. Wall thickness was normal. Systolic function was normal. - Tricuspid valve: There was trivial regurgitation. - Pulmonary arteries: Systolic pressure was within the normal range. PA peak pressure: 29 mm Hg (S).    ASSESSMENT & PLAN:     1.CAD/unstable angina  - s/p recent cath - with overlapping stents in the LCX - he has continued to have chest pain - long acting nitrate and PPI were to be added - he did not receive these initially - then only took one dose of Imdur - had headache and so he stopped - did not take the PPI    Challenging situation. Fortunately, doing much better now - chest pain is resolved.   2. HTN - would follow  3. HLD - he is hesitant to add additional therapy - he has had nice response with statin - for repeat lab in late April per the lipid clinic.   4. Follow up chest CT for lung nodule - this was stable - would plan to repeat in one year.   5. Former smoker   6. COVID-19 Education: The signs and symptoms of COVID-19 were discussed with the patient and how to seek care for testing (follow up with PCP or arrange E-visit).  The importance of social distancing, staying at home, hand hygiene and wearing a mask when out in public were discussed today.  Patient Risk:   After full review of this patient's clinical status, I feel that they are at least moderate risk at this time.  Time:   Today, I have spent 7 minutes with the patient with telehealth technology discussing the above issues.     Medication Adjustments/Labs and Tests Ordered: Current medicines are reviewed at length with the patient today.  Concerns regarding medicines are outlined above.   Tests Ordered: No orders of the defined types were placed in this encounter.   Medication Changes: Meds ordered this encounter  Medications  . ticagrelor (BRILINTA) 90 MG TABS tablet    Sig: Take 1 tablet (90 mg total) by mouth 2 (two) times daily.    Dispense:  60 tablet    Refill:  11    Disposition:  FU with  Dr. [name] as planned next month.      Patient is agreeable to this plan and will call if any problems develop in the interim.   Amie Critchley, NP  07/22/2019 10:18 AM    [place]

## 2019-07-15 NOTE — Patient Instructions (Addendum)
Your LDL is 144, your goal is less than 70.  Try increasing your Crestor to 10mg  daily. If you have any muscle aches, decrease your dose back to 5mg  daily  I would like to start you on Nexlizet - this is a combination pill that includes bempedoic acid and Zetia that will lower your LDL by an extra 40%.  Your triglycerides are 268 and your goal is less than 150.  Vascepa is a prescription fish oil that will lower your triglycerides about 30% and also prevent heart attacks and strokes.   Please call Crystall Donaldson, Pharmacist with Dr Kyla Balzarine office if you would like to start either of these medications [phone].

## 2019-07-15 NOTE — Progress Notes (Signed)
Patient ID: ABDIFATAH TARWATER                 DOB: 17-May-1951                    MRN: [id number]     HPI: Johnny [name] is a 69 y.o. male patient of Dr [name] referred to lipid clinic by Erin Hearing, PharmD while pt was recently admitted. PMH is significant for CAD s/p MI, TIA, HTN, HLD, and GERD. He was admitted 06/23/19 for chest pain, cath revealed severe mid to distal LCx stenosis treated with overlapping DES x2. Does have residual nonobstructive LAD stenosis with moderate lesion in the distal vessel, along with nonobstructive stenosis in the RCA and PDA branch. Plavix was also changed to Brilinta. He presented to the ED on 07/04/19 with further chest pain, ruled out for MI. Episode of chest pain resolved with Maalox, questioned if pain was due to his GERD. He was discharged on Protonix due to possible GI component.  Pt presents today in good spirits. He did pick up his Isordil and Protonix. He reports taking 1 dose of Isordil and had to stop therapy due to headaches (previously experienced headaches with Imdur too). He has not used Protonix yet as he hasn't reported any acid reflux. Discussed that PPIs work better when used on a consistent basis and that an antacid like Tums or H2 blocker would be more effective for prn relief.  He reports tolerating his Crestor 5mg  daily well. He started taking this 1 month ago after lipids were drawn at his PCP. He is willing to increase his dose. Reports previous myalgias with Lipitor and other statins took a bit longer to develop - he took Lipitor for a few years before stopping secondary to myalgias. He does not recall having issues tolerating Zetia but states an MD changed him to a statin instead. Does not recall issues with fenofibrate but he has taken this in the past as well.  Current Medications: rosuvastatin 5mg  daily  Intolerances: atorvastatin 40mg  daily, rosuvastatin 10mg  and 20mg  daily, pravastatin 10mg  every other day, fenofibrate 145mg  and 160mg   daily, Praluent - myalgias. Zetia 10mg  daily - other MD stopped this, no issues tolerating Risk Factors: angina, CAD, TIA, HTN LDL goal: 70mg /dL, non-HDL < 100mg /dL  Diet: Has cut back on burgers, red meat, fast food, fried food, and cheese, eating more salad and chicken. Uses low fat dairy products. No alcohol. Trying to decrease sweets.  Exercise: Stays busy with his grandkids  Family History: The patient's family history includes Emphysema in his father; Heart attack in his mother; Heart disease in his mother; Stroke in his mother.   Social History: The patient  reports that he quit smoking about 41 years ago. His smoking use included cigarettes. He quit after 10.00 years of use. He has never used smokeless tobacco. He reports that he does not drink alcohol or use drugs.  Labs: 06/11/19: TC 225, HDL 40, TG 268, LDL 144, non-HDL 185 (no lipid lowering therapy)  Past Medical History:  Diagnosis Date  . Adenomatous polyps   . Bronchitis   . CAD in native artery 2017  . Chronic back pain   . GERD (gastroesophageal reflux disease)   . Hyperlipidemia   . Hypertension   . Internal hemorrhoids   . Low testosterone   . Myocardial infarction ([place])   . Osteopenia   . Shortness of breath dyspnea   . Spondylosis   . TIA (transient  ischemic attack) 2008  . Ureterolithiasis   . Ventral hernia   . Vitamin D deficiency     Current Outpatient Medications on File Prior to Visit  Medication Sig Dispense Refill  . aspirin EC 81 MG EC tablet Take 1 tablet (81 mg total) by mouth daily. 90 tablet 1  . BYSTOLIC 2.5 MG tablet TAKE 1 TABLET BY MOUTH EVERY DAY (Patient taking differently: Take 2.5 mg by mouth daily. ) 90 tablet 3  . Cyanocobalamin (VITAMIN B-12) 1000 MCG SUBL Place 1 each under the tongue every morning.    . isosorbide dinitrate (ISORDIL) 10 MG tablet Take 1 tablet (10 mg total) by mouth 3 (three) times daily. 90 tablet 2  . nitroGLYCERIN (NITROSTAT) 0.4 MG SL tablet Place 0.4 mg  under the tongue every 5 (five) minutes as needed for chest pain.    Marland Kitchen ondansetron (ZOFRAN-ODT) 4 MG disintegrating tablet Take 4 mg by mouth every 8 (eight) hours as needed for nausea or vomiting.     Marland Kitchen oxyCODONE-acetaminophen (PERCOCET) 10-325 MG tablet Take 1 tablet by mouth every 6 (six) hours as needed for pain.     . pantoprazole (PROTONIX) 20 MG tablet Take 1 tablet (20 mg total) by mouth daily. 30 tablet 1  . RANEXA 500 MG 12 hr tablet TAKE 1 TABLET BY MOUTH TWICE A DAY (Patient taking differently: Take 500 mg by mouth 2 (two) times daily. ) 180 tablet 3  . rosuvastatin (CRESTOR) 5 MG tablet Take 1 tablet (5 mg total) by mouth daily. 90 tablet 0  . ticagrelor (BRILINTA) 90 MG TABS tablet Take 1 tablet (90 mg total) by mouth 2 (two) times daily. 180 tablet 2   No current facility-administered medications on file prior to visit.    Allergies  Allergen Reactions  . Atorvastatin Other (See Comments)    Joint pain  . Metadate Cd [Methylphenidate Hcl Er (Cd)] Other (See Comments)    Burning, GI upset, bloating  . Other Other (See Comments)    Burning, GI upset, bloating  . Praluent [Alirocumab]     Muscle pain and flu like symptoms  . Pravastatin Other (See Comments)  . Hydrocodone Nausea Only    Can take with food  . Levofloxacin Nausea And Vomiting  . Levofloxacin In D5w Nausea And Vomiting  . Morphine And Related Nausea And Vomiting    Assessment/Plan:  1. Hyperlipidemia - Baseline LDL 144 above goal < 70, pt wishes to have updated labs checked today since he has been taking Crestor 5mg  daily for the past month, will check this today. He is willing to increase his dose of Crestor to 10mg  daily. Advised him to decrease his dose back to 5mg  daily if he develops myalgias on higher dosing. Also encouraged pt to start Nexlizet to lower his LDL an additional 40% - he wishes to discuss this with his PCP first. TG also elevated at 268 above goal <150. He is working to cut back on sweets.  Discussed cardiovascular benefit of Vascepa which would also lower TG about 30%. He also wishes to discuss this with PCP. Provided pt with my direct # so that he can call if he would like to start Nexletol or Vascepa, I strongly encouraged him to consider starting both in addition to his statin for optimal lipid lowering and CV benefit.   Fransico Sciandra E. Akeema Broder, PharmD, BCACP, [place] [id number] [address], [place], [place] [address] Phone: [phone]; Fax: [phone] 07/15/2019 8:50 AM

## 2019-07-15 NOTE — Progress Notes (Signed)
Thanks for the update!  Johnny [name]

## 2019-07-15 NOTE — Progress Notes (Signed)
I totally agree with you. He has been reluctant all along to get control of his lipids with medication. I will tell him to do as Cardiology says. Thank you for contacting me.

## 2019-07-17 ENCOUNTER — Telehealth: Payer: Self-pay | Admitting: Pharmacist

## 2019-07-17 DIAGNOSIS — E7800 Pure hypercholesterolemia, unspecified: Secondary | ICD-10-CM

## 2019-07-17 NOTE — Telephone Encounter (Signed)
Pt returned call to clinic. He is tolerating dose increase of rosuvastatin 10mg  daily well. He does not wish to start additional medication at this time, including Vascepa. He reports he has made many dietary improvements like eating more salads and grilled chicken, and has cut back on sweets and sugary drinks. Will recheck lipids in 2 months and if TG remain elevated at that time, will discuss addition of Vascepa again.

## 2019-07-17 NOTE — Telephone Encounter (Signed)
Updated lipid panel has improved notably after pt has been on Crestor 5mg  daily for 4 weeks.  07/15/19: TC 146, TG 232, HDL 37, LDL 71 improved from TC 225, HDL 40, TG 268, LDL 144 on 06/11/19.  Still recommend starting Vascepa 2g BID to lower TG and for CV benefit. Pt agreeable to increasing rosuvastatin to 10mg  daily but was hesitant to add new medication at visit.  Called pt, he was out but spoke with his wife. She will have pt call clinic back to discuss Vascepa.

## 2019-07-22 ENCOUNTER — Other Ambulatory Visit: Payer: Self-pay

## 2019-07-22 ENCOUNTER — Telehealth (INDEPENDENT_AMBULATORY_CARE_PROVIDER_SITE_OTHER): Payer: Medicare Other | Admitting: Nurse Practitioner

## 2019-07-22 ENCOUNTER — Encounter: Payer: Self-pay | Admitting: Nurse Practitioner

## 2019-07-22 VITALS — BP 143/81 | HR 57 | Ht 72.0 in | Wt 214.0 lb

## 2019-07-22 DIAGNOSIS — E785 Hyperlipidemia, unspecified: Secondary | ICD-10-CM | POA: Diagnosis not present

## 2019-07-22 DIAGNOSIS — I10 Essential (primary) hypertension: Secondary | ICD-10-CM | POA: Diagnosis not present

## 2019-07-22 DIAGNOSIS — Z87891 Personal history of nicotine dependence: Secondary | ICD-10-CM

## 2019-07-22 DIAGNOSIS — Z955 Presence of coronary angioplasty implant and graft: Secondary | ICD-10-CM

## 2019-07-22 DIAGNOSIS — R911 Solitary pulmonary nodule: Secondary | ICD-10-CM | POA: Diagnosis not present

## 2019-07-22 DIAGNOSIS — I25110 Atherosclerotic heart disease of native coronary artery with unstable angina pectoris: Secondary | ICD-10-CM | POA: Diagnosis not present

## 2019-07-22 DIAGNOSIS — E7849 Other hyperlipidemia: Secondary | ICD-10-CM

## 2019-07-22 DIAGNOSIS — Z7189 Other specified counseling: Secondary | ICD-10-CM

## 2019-07-22 DIAGNOSIS — I259 Chronic ischemic heart disease, unspecified: Secondary | ICD-10-CM

## 2019-07-22 MED ORDER — TICAGRELOR 90 MG PO TABS: 90.0000 mg | ORAL_TABLET | Freq: Two times a day (BID) | ORAL | 11 refills | Status: DC

## 2019-07-22 NOTE — Patient Instructions (Addendum)
After Visit Summary:  We will be checking the following labs today - NONE   Medication Instructions:    Continue with your current medicines.   I have refilled your Brilinta for you today.    If you need a refill on your cardiac medications before your next appointment, please call your pharmacy.     Testing/Procedures To Be Arranged:  N/A  Follow-Up:   See Dr. [name] as planned next month - you do not need to fast for that visit  You will have repeat fasting labs on September 14, 2019 at our office to recheck your cholesterol levels    At [facility], you and your health needs are our priority.  As part of our continuing mission to provide you with exceptional heart care, we have created designated Provider Care Teams.  These Care Teams include your primary Cardiologist (physician) and Advanced Practice Providers (APPs -  Physician Assistants and Nurse Practitioners) who all work together to provide you with the care you need, when you need it.  Special Instructions:  . Stay safe, stay home, wash your hands for at least 20 seconds and wear a mask when out in public.  . It was good to talk with you today.  Marland Kitchen Keep up the good work.    Call the [place] office at [phone] if you have any questions, problems or concerns.

## 2019-07-27 ENCOUNTER — Other Ambulatory Visit: Payer: Self-pay | Admitting: Student

## 2019-07-28 ENCOUNTER — Other Ambulatory Visit: Payer: Medicare Other | Admitting: Internal Medicine

## 2019-07-30 ENCOUNTER — Ambulatory Visit: Payer: Medicare Other | Admitting: Internal Medicine

## 2019-08-04 ENCOUNTER — Encounter (HOSPITAL_COMMUNITY): Payer: Self-pay

## 2019-08-04 NOTE — Telephone Encounter (Signed)
Attempted to call patient in regards to Cardiac Rehab - unable to leave a VM no VM set up Mailed letter

## 2019-08-14 NOTE — Progress Notes (Signed)
Telehealth Visit       Date:  08/20/2019   ID:  Johnny [name], DOB 1951-03-23, MRN [id number]  Patient Location:  Home  Provider location:   Home  PCP:  [name], MD  Cardiologist:  Servando Snare & [name], MD  Electrophysiologist:  None   Chief Complaint:  Follow up  History of Present Illness:    69 y.o. with a history of HTN, HLD,TIA, GERD,andCADwith priormyocardial infarction. Former smoker.Cardiac cath in 2017 revealed a ramus lesion at 95% stenosed; first diagonal lesion 90% stenosed; mid LAD to distal LAD lesion 50% stenosed; and left ventricular systolic function which was normal. Hewasmanaged medically.  Presented back to the hospital in 01/2018 with chest pain and was cathed - unchanged - medical management to continue. Intolerant of nitrates due to headache. Statin intolerance but on low dose Crestor.   Seen by PA February 2021 with more angina underwent cath the following morning and was found to have severe m/dLcx stenosis which was new from prior cath felt to be plaque rupture treated with overlapping DESx2. Does have residual nonobstructive LAD stenosis with moderate lesion in the distal vessel, along with nonobstructive stenosis in the RCA and PDA branch. These lesions were unchanged when compared to prior study. He was switched from Plavix to Brilinta. Plan for DAPT for at least one year. Of note he has been intolerant to higher dose statins, currently doing ok with Crestor 5mg  daily.  Tends to get lots of GERD when not eating well   No angina Active has fresh nitro   The patient does not have symptoms concerning for COVID-19 infection (fever, chills, cough, or new shortness of breath).     Past Medical History:  Diagnosis Date  . Adenomatous polyps   . Bronchitis   . CAD in native artery 2017  . Chronic back pain   . GERD (gastroesophageal reflux disease)   . Hyperlipidemia   . Hypertension   . Internal hemorrhoids   . Low testosterone    . Myocardial infarction ([place])   . Osteopenia   . Shortness of breath dyspnea   . Spondylosis   . TIA (transient ischemic attack) 2008  . Ureterolithiasis   . Ventral hernia   . Vitamin D deficiency    Past Surgical History:  Procedure Laterality Date  . BACK SURGERY     2 surgeries  . CARDIAC CATHETERIZATION N/A 09/12/2015   Procedure: Left Heart Cath and Coronary Angiography;  Surgeon: Lorretta Harp, MD;  Location: [place] [facility];  Service: Cardiovascular;  Laterality: N/A;  . CORONARY STENT INTERVENTION N/A 06/23/2019   Procedure: CORONARY STENT INTERVENTION;  Surgeon: Sherren Mocha, MD;  Location: [place] [facility];  Service: Cardiovascular;  Laterality: N/A;  . LEFT HEART CATH AND CORONARY ANGIOGRAPHY N/A 01/27/2018   Procedure: LEFT HEART CATH AND CORONARY ANGIOGRAPHY;  Surgeon: Jettie Booze, MD;  Location: [place] [facility];  Service: Cardiovascular;  Laterality: N/A;  . LEFT HEART CATH AND CORONARY ANGIOGRAPHY N/A 06/23/2019   Procedure: LEFT HEART CATH AND CORONARY ANGIOGRAPHY;  Surgeon: Sherren Mocha, MD;  Location: [place] [facility];  Service: Cardiovascular;  Laterality: N/A;     No outpatient medications have been marked as taking for the 08/20/19 encounter (Office Visit) with [name], MD.     Allergies:   Atorvastatin, Metadate cd [methylphenidate hcl er (cd)], Other, Praluent [alirocumab], Pravastatin, Hydrocodone, Levofloxacin, Levofloxacin in d5w, and Morphine and related   Social History  Tobacco Use  . Smoking status: Former Smoker    Years: 10.00    Types: Cigarettes    Quit date: 01/04/1978    Years since quitting: 41.6  . Smokeless tobacco: Never Used  Substance Use Topics  . Alcohol use: No    Alcohol/week: 0.0 standard drinks    Comment: rarely  . Drug use: No     Family Hx: The patient's family history includes Emphysema in his father; Heart attack in his mother; Heart disease in his mother; Stroke in his mother.  ROS:     Please see the history of present illness.   All other systems reviewed are negative.    Objective:    Vital Signs:  BP 108/62   Pulse 64   Ht 6' (1.829 m)   Wt 206 lb (93.4 kg)   SpO2 98%   BMI 27.94 kg/m    Wt Readings from Last 3 Encounters:  08/20/19 206 lb (93.4 kg)  07/22/19 214 lb (97.1 kg)  07/07/19 214 lb 12.8 oz (97.4 kg)    Affect appropriate Healthy:  appears stated age 7: normal Neck supple with no adenopathy JVP normal no bruits no thyromegaly Lungs clear with no wheezing and good diaphragmatic motion Heart:  S1/S2 no murmur, no rub, gallop or click PMI normal Abdomen: benighn, BS positve, no tenderness, no AAA no bruit.  No HSM or HJR Distal pulses intact with no bruits No edema Neuro non-focal Skin warm and dry No muscular weakness    Labs/Other Tests and Data Reviewed:    Lab Results  Component Value Date   WBC 6.5 07/04/2019   HGB 13.8 07/04/2019   HCT 41.1 07/04/2019   PLT 238 07/04/2019   GLUCOSE 92 07/05/2019   CHOL 146 07/15/2019   TRIG 232 (H) 07/15/2019   HDL 37 (L) 07/15/2019   LDLCALC 71 07/15/2019   ALT 24 07/15/2019   AST 18 07/15/2019   NA 137 07/05/2019   K 4.0 07/05/2019   CL 104 07/05/2019   CREATININE 1.25 (H) 07/05/2019   BUN 11 07/05/2019   CO2 23 07/05/2019   TSH 0.637 Test methodology is 3rd generation TSH 05/01/2007   PSA 0.5 06/11/2019   INR 1.06 09/12/2015   HGBA1C 5.3 06/11/2019   MICROALBUR 0.4 06/11/2019     BNP (last 3 results) No results for input(s): BNP in the last 8760 hours.  ProBNP (last 3 results) No results for input(s): PROBNP in the last 8760 hours.    Prior CV studies:    The following studies were reviewed today:  CT CHEST IMPRESSION 06/2019: Small bilateral pulmonary nodules, 4 mm. The left lower lobe nodule is stable since prior study. The right middle lobe nodule was difficult to see on prior study due to adjacent atelectasis. Repeat study in 1 year could be reformed to  ensure 2 years of stability.  Old granulomatous disease.  Coronary artery disease.  Aortic Atherosclerosis (ICD10-I70.0) and Emphysema (ICD10-J43.9).   Electronically Signed   By: Rolm Baptise M.D.   On: 07/09/2019 09:33  CORONARY STENT INTERVENTION 06/23/2019  LEFT HEART CATH AND CORONARY ANGIOGRAPHY  Conclusion  1. Nonobstructive LAD stenosis with moderately tight distal vessel stenosis and severe diagonal branch stenosis, all unchanged from the previous study. 2. Nonobstructive RCA stenosis, large dominant vessel with mild nonobstructive plaque in the PDA branch 3. Severe mid to distal left circumflex stenosis, new lesion from previous study suspected plaque rupture, treated successfully with overlapping drug-eluting stents as outlined (3.0 x  12 mm resolute Onyx and 3.0 x 15 mm resolute Onyx placed more proximally in overlapping fashion) 4. Normal LV systolic function  Recommendations: We will change the patient from clopidogrel to take ticagrelor for more potent antiplatelet therapy. Aggressive risk reduction measures and medical therapy.    EchoStudy Conclusions9/2019  - Left ventricle: The cavity size was normal. There was moderate focal basal hypertrophy of the septum. Systolic function was normal. The estimated ejection fraction was in the range of 60% to 65%. Wall motion was normal; there were no regional wall motion abnormalities. Doppler parameters are consistent with abnormal left ventricular relaxation (grade 1 diastolic dysfunction). Doppler parameters are consistent with indeterminate ventricular filling pressure. - Aortic valve: Transvalvular velocity was within the normal range. There was no stenosis. There was no regurgitation. Valve area (VTI): 3.34 cm^2. Valve area (Vmax): 3.29 cm^2. Valve area (Vmean): 3.33 cm^2. - Mitral valve: Transvalvular velocity was within the normal range. There was no evidence for stenosis.  There was trivial regurgitation. - Right ventricle: The cavity size was normal. Wall thickness was normal. Systolic function was normal. - Tricuspid valve: There was trivial regurgitation. - Pulmonary arteries: Systolic pressure was within the normal range. PA peak pressure: 29 mm Hg (S).    ASSESSMENT & PLAN:     1.CAD/unstable angina - 06/23/19  cath - with overlapping stents in the LCX - continue medical Rx Headache with nitrates  Continue DAT, ranexa, bystolic and statin Brilinta refilled   2. HTN - Well controlled.  Continue current medications and low sodium Dash type diet.    3. HLD - on statin target LDL 70 or less LDL 71 07/15/19   4. Follow up chest CT for lung nodule - this was stable on CT 07/08/19  - would plan to repeat in one year.   5. COVID-19 Education: The signs and symptoms of COVID-19 were discussed with the patient and how to seek care for testing (follow up with PCP or arrange E-visit).  The importance of social distancing, staying at home, hand hygiene and wearing a mask when out in public were discussed today.  Patient Risk:   After full review of this patient's clinical status, I feel that they are at least moderate risk at this time.   Medication Adjustments/Labs and Tests Ordered: Current medicines are reviewed at length with the patient today.  Concerns regarding medicines are outlined above.   Tests Ordered: No orders of the defined types were placed in this encounter.   Medication Changes: No orders of the defined types were placed in this encounter.   Disposition:  FU with me in a year      Patient is agreeable to this plan and will call if any problems develop in the interim.   Signed, [name], MD  08/20/2019 9:56 AM    [place]

## 2019-08-18 ENCOUNTER — Telehealth (HOSPITAL_COMMUNITY): Payer: Self-pay

## 2019-08-18 NOTE — Telephone Encounter (Signed)
No response from pt.  Closed referral  

## 2019-08-20 ENCOUNTER — Other Ambulatory Visit: Payer: Self-pay

## 2019-08-20 ENCOUNTER — Encounter: Payer: Self-pay | Admitting: Cardiovascular Disease

## 2019-08-20 ENCOUNTER — Ambulatory Visit: Payer: Medicare Other | Admitting: Cardiovascular Disease

## 2019-08-20 VITALS — BP 108/62 | HR 64 | Ht 72.0 in | Wt 206.0 lb

## 2019-08-20 DIAGNOSIS — I2510 Atherosclerotic heart disease of native coronary artery without angina pectoris: Secondary | ICD-10-CM | POA: Diagnosis not present

## 2019-08-20 MED ORDER — TICAGRELOR 90 MG PO TABS: 90.0000 mg | ORAL_TABLET | Freq: Two times a day (BID) | ORAL | 11 refills | Status: DC

## 2019-08-20 NOTE — Patient Instructions (Addendum)
Medication Instructions:  *If you need a refill on your cardiac medications before your next appointment, please call your pharmacy*  Lab Work: If you have labs (blood work) drawn today and your tests are completely normal, you will receive your results only by: Marland Kitchen MyChart Message (if you have MyChart) OR . A paper copy in the mail If you have any lab test that is abnormal or we need to change your treatment, we will call you to review the results.  Testing/Procedures: None    Follow-Up: At [facility], you and your health needs are our priority.  As part of our continuing mission to provide you with exceptional heart care, we have created designated Provider Care Teams.  These Care Teams include your primary Cardiologist (physician) and Advanced Practice Providers (APPs -  Physician Assistants and Nurse Practitioners) who all work together to provide you with the care you need, when you need it.  We recommend signing up for the patient portal called "MyChart".  Sign up information is provided on this After Visit Summary.  MyChart is used to connect with patients for Virtual Visits (Telemedicine).  Patients are able to view lab/test results, encounter notes, upcoming appointments, etc.  Non-urgent messages can be sent to your provider as well.   To learn more about what you can do with MyChart, go to [email or web address].    Your next appointment:   12 month(s)  The format for your next appointment:   In Person  Provider:   You may see [name], MD or one of the following Advanced Practice Providers on your designated Care Team:    Truitt Merle, NP  Cecilie Kicks, NP  Kathyrn Drown, NP

## 2019-09-07 ENCOUNTER — Other Ambulatory Visit: Payer: Self-pay | Admitting: Internal Medicine

## 2019-09-14 ENCOUNTER — Other Ambulatory Visit: Payer: Self-pay

## 2019-09-14 ENCOUNTER — Other Ambulatory Visit: Payer: Medicare Other | Admitting: *Deleted

## 2019-09-14 DIAGNOSIS — E7800 Pure hypercholesterolemia, unspecified: Secondary | ICD-10-CM

## 2019-09-14 LAB — LIPID PANEL
Chol/HDL Ratio: 3.7 ratio (ref 0.0–5.0)
Cholesterol, Total: 157 mg/dL (ref 100–199)
HDL: 43 mg/dL (ref >39)
LDL Chol Calc (NIH): 82 mg/dL (ref 0–99)
Triglycerides: 187 mg/dL — ABNORMAL HIGH (ref 0–149)
VLDL Cholesterol Cal: 32 mg/dL (ref 5–40)

## 2019-09-15 ENCOUNTER — Telehealth: Payer: Self-pay

## 2019-09-15 DIAGNOSIS — E785 Hyperlipidemia, unspecified: Secondary | ICD-10-CM

## 2019-09-15 MED ORDER — EZETIMIBE 10 MG PO TABS
10.0000 mg | ORAL_TABLET | Freq: Every day | ORAL | 3 refills | Status: DC
Start: 2019-09-15 — End: 2020-06-24

## 2019-09-15 NOTE — Addendum Note (Signed)
Addended by: Aris Georgia, Milbert Bixler L on: 09/15/2019 11:10 AM   Modules accepted: Orders

## 2019-09-15 NOTE — Telephone Encounter (Signed)
Called patient's wife (DPR). She had received message about changes. Patient is going to stay on Crestor 5 mg and start zetia 10 mg by mouth daily, then come in for lab work on 12/15/19. Patient's wife verbalized understanding.

## 2019-09-15 NOTE — Telephone Encounter (Signed)
-----   Message from [name], MD sent at 09/15/2019  8:08 AM EDT ----- Increase crestor to 10 mg LDL above 70 f/u labs 3 months

## 2019-09-15 NOTE — Telephone Encounter (Signed)
Spoke with patient's wife (DPR). She is aware of Dr. Kyla Balzarine recommendations. She stated patient has issues with muscle aches with crestor 10 mg. Discussed with [facility] D, patient can stay on Crestor 5 mg and add Zetia 10 mg. Called patient back, left detailed message about changes.

## 2019-11-17 ENCOUNTER — Other Ambulatory Visit: Payer: Self-pay | Admitting: Internal Medicine

## 2019-12-15 ENCOUNTER — Other Ambulatory Visit: Payer: Medicare Other

## 2019-12-21 ENCOUNTER — Other Ambulatory Visit: Payer: Self-pay | Admitting: Nurse Practitioner

## 2019-12-21 DIAGNOSIS — Z9889 Other specified postprocedural states: Secondary | ICD-10-CM

## 2019-12-21 DIAGNOSIS — I259 Chronic ischemic heart disease, unspecified: Secondary | ICD-10-CM

## 2020-01-19 ENCOUNTER — Other Ambulatory Visit: Payer: Medicare Other | Admitting: *Deleted

## 2020-01-19 ENCOUNTER — Other Ambulatory Visit: Payer: Self-pay

## 2020-01-19 DIAGNOSIS — E785 Hyperlipidemia, unspecified: Secondary | ICD-10-CM

## 2020-01-19 LAB — HEPATIC FUNCTION PANEL
ALT: 17 IU/L (ref 0–44)
AST: 17 IU/L (ref 0–40)
Albumin: 4.5 g/dL (ref 3.8–4.8)
Alkaline Phosphatase: 57 IU/L (ref 48–121)
Bilirubin Total: 0.5 mg/dL (ref 0.0–1.2)
Bilirubin, Direct: 0.18 mg/dL (ref 0.00–0.40)
Total Protein: 6.4 g/dL (ref 6.0–8.5)

## 2020-01-19 LAB — LIPID PANEL
Chol/HDL Ratio: 3.1 ratio (ref 0.0–5.0)
Cholesterol, Total: 135 mg/dL (ref 100–199)
HDL: 44 mg/dL (ref >39)
LDL Chol Calc (NIH): 69 mg/dL (ref 0–99)
Triglycerides: 126 mg/dL (ref 0–149)
VLDL Cholesterol Cal: 22 mg/dL (ref 5–40)

## 2020-02-27 ENCOUNTER — Other Ambulatory Visit: Payer: Self-pay | Admitting: Internal Medicine

## 2020-02-27 NOTE — Telephone Encounter (Signed)
CPE  and Medicare wellness due late January after Jan 21. Please make appt before refilling

## 2020-02-29 NOTE — Telephone Encounter (Signed)
Scheduled

## 2020-03-01 ENCOUNTER — Other Ambulatory Visit: Payer: Self-pay | Admitting: Nurse Practitioner

## 2020-03-01 DIAGNOSIS — I259 Chronic ischemic heart disease, unspecified: Secondary | ICD-10-CM

## 2020-03-01 DIAGNOSIS — Z9889 Other specified postprocedural states: Secondary | ICD-10-CM

## 2020-03-02 MED ORDER — NEBIVOLOL HCL 2.5 MG PO TABS: 2.5000 mg | ORAL_TABLET | Freq: Every day | ORAL | 1 refills | Status: DC

## 2020-03-22 ENCOUNTER — Telehealth: Payer: Self-pay | Admitting: Cardiovascular Disease

## 2020-03-22 NOTE — Telephone Encounter (Signed)
Ok to change to plavix 75 mg daily

## 2020-03-22 NOTE — Telephone Encounter (Signed)
**Note De-Identified  Obfuscation** I called the pts wife and DPR Sheryl back to discuss cost of the pts Brilinta.  Sheryl states that Kary Kos is so expensive for them and that they only purchase a month supply at a time. She reports that the pt recently picked up a refill so he has several weeks of Brilinta currently on hand.  I mentioned the pt applying for pt asst through [place] and [place] but she states that they were turned down in the past.  She wants to know if the pt can take generic Clopidogrel instead as the pt has taken it in the past without any issues. He has been taking Brilinta since 06/24/2019 post stent placement.  She is aware that I am forwarding this message to Dr [name] and his nurse for advisement.  She verbalized understanding and thanked me for calling her back.

## 2020-03-22 NOTE — Telephone Encounter (Signed)
Left message for patient to call back  

## 2020-03-22 NOTE — Telephone Encounter (Signed)
Pt c/o medication issue:  1. Name of Medication: ticagrelor (BRILINTA) 90 MG TABS tablet  2. How are you currently taking this medication (dosage and times per day)? As directed  3. Are you having a reaction (difficulty breathing--STAT)? no  4. What is your medication issue? Per wife, this medication is very expensive. She wants to know if there is an alternate medication that he could be taking that would be cheaper. Please advise.

## 2020-03-23 ENCOUNTER — Other Ambulatory Visit: Payer: Self-pay

## 2020-03-23 ENCOUNTER — Emergency Department (HOSPITAL_BASED_OUTPATIENT_CLINIC_OR_DEPARTMENT_OTHER)
Admission: EM | Admit: 2020-03-23 | Discharge: 2020-03-23 | Disposition: A | Discharge status: Home / Self Care | Payer: Medicare Other | Attending: Emergency Medicine | Admitting: Emergency Medicine

## 2020-03-23 ENCOUNTER — Emergency Department (HOSPITAL_BASED_OUTPATIENT_CLINIC_OR_DEPARTMENT_OTHER): Payer: Medicare Other

## 2020-03-23 ENCOUNTER — Encounter (HOSPITAL_BASED_OUTPATIENT_CLINIC_OR_DEPARTMENT_OTHER): Payer: Self-pay | Admitting: *Deleted

## 2020-03-23 DIAGNOSIS — Z8673 Personal history of transient ischemic attack (TIA), and cerebral infarction without residual deficits: Secondary | ICD-10-CM | POA: Diagnosis not present

## 2020-03-23 DIAGNOSIS — Y9301 Activity, walking, marching and hiking: Secondary | ICD-10-CM | POA: Diagnosis not present

## 2020-03-23 DIAGNOSIS — S20211A Contusion of right front wall of thorax, initial encounter: Secondary | ICD-10-CM | POA: Diagnosis not present

## 2020-03-23 DIAGNOSIS — I10 Essential (primary) hypertension: Secondary | ICD-10-CM | POA: Insufficient documentation

## 2020-03-23 DIAGNOSIS — W101XXA Fall (on)(from) sidewalk curb, initial encounter: Secondary | ICD-10-CM | POA: Insufficient documentation

## 2020-03-23 DIAGNOSIS — Z87891 Personal history of nicotine dependence: Secondary | ICD-10-CM | POA: Insufficient documentation

## 2020-03-23 DIAGNOSIS — Z7982 Long term (current) use of aspirin: Secondary | ICD-10-CM | POA: Insufficient documentation

## 2020-03-23 DIAGNOSIS — R079 Chest pain, unspecified: Secondary | ICD-10-CM | POA: Diagnosis present

## 2020-03-23 DIAGNOSIS — S60414A Abrasion of right ring finger, initial encounter: Secondary | ICD-10-CM | POA: Insufficient documentation

## 2020-03-23 DIAGNOSIS — I2510 Atherosclerotic heart disease of native coronary artery without angina pectoris: Secondary | ICD-10-CM | POA: Insufficient documentation

## 2020-03-23 MED ORDER — OXYCODONE-ACETAMINOPHEN 5-325 MG PO TABS
1.0000 | ORAL_TABLET | Freq: Once | ORAL | Status: AC
  Administered 2020-03-23: 1 via ORAL
  Filled 2020-03-23: qty 1

## 2020-03-23 MED ORDER — ONDANSETRON 4 MG PO TBDP
4.0000 mg | ORAL_TABLET | Freq: Once | ORAL | Status: AC
  Administered 2020-03-23: 4 mg via ORAL
  Filled 2020-03-23: qty 1

## 2020-03-23 MED ORDER — CLOPIDOGREL BISULFATE 75 MG PO TABS
75.0000 mg | ORAL_TABLET | Freq: Every day | ORAL | 3 refills | Status: DC
Start: 2020-03-23 — End: 2021-04-05

## 2020-03-23 NOTE — Discharge Instructions (Signed)
You can use ice and heat to the area.  Also you can use voltaren gel or lidocaine patches that are over the counter.

## 2020-03-23 NOTE — ED Triage Notes (Signed)
C/o fall from standing x 1 hr ago , c/o head/face injury and right rib pain. Pt is on blood thinners

## 2020-03-23 NOTE — Telephone Encounter (Signed)
Pt wife return call to [facility].  She stated it was ok to leave a VM on on the [phone]

## 2020-03-23 NOTE — Telephone Encounter (Signed)
Called patient's wife back. Informed her of Dr. Kyla Balzarine recommendations. Patient will start Plavix and discontinue Brilinta.

## 2020-03-23 NOTE — ED Provider Notes (Signed)
[facility] EMERGENCY DEPARTMENT Provider Note   CSN: [id number] Arrival date & time: 03/23/20  1825     History Chief Complaint  Patient presents with  . Fall    FROYLAN HOBBY is a 69 y.o. male.  Patient is a 69 year old male with a history of CAD status post NSTEMI and stent placement on Brilinta and aspirin, hypertension, hyperlipidemia and prior TIA who presents today with 7 out of 10 right-sided chest pain that started after a fall today.  Patient reports that he was walking at a good speed in his driveway when he stepped on uneven pavement and fell directly on his right chest.  He states initially it knocked the wind out of him and he laid there for a while before he was able to get up.  The right side of his face also hit the mossy area of the ground but he denies any head injury or loss of consciousness.  He is not having any pain in his face and he is able to ambulate but with any deep breathing, moving of the right arm or stretching he has severe pain.  He now does not have shortness of breath but it is painful when he takes a deep breath.  He has no abdominal pain.  The history is provided by the patient and the spouse.  Fall This is a new problem. The current episode started 1 to 2 hours ago. The problem occurs constantly. The problem has not changed since onset.Associated symptoms include chest pain and shortness of breath. The symptoms are aggravated by bending and twisting (deep breaths). Nothing relieves the symptoms. He has tried nothing for the symptoms.       Past Medical History:  Diagnosis Date  . Adenomatous polyps   . Bronchitis   . CAD in native artery 2017  . Chronic back pain   . GERD (gastroesophageal reflux disease)   . Hyperlipidemia   . Hypertension   . Internal hemorrhoids   . Low testosterone   . Myocardial infarction ([place])   . Osteopenia   . Shortness of breath dyspnea   . Spondylosis   . TIA (transient ischemic attack) 2008  .  Ureterolithiasis   . Ventral hernia   . Vitamin D deficiency     Patient Active Problem List   Diagnosis Date Noted  . Atelectasis of both lungs 01/26/2018  . 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 Unstable angina ([place])   . TIA (transient ischemic attack) 01/25/2018  . Exertional dyspnea 09/23/2015  . CAP (community acquired pneumonia) 09/23/2015  . Lung nodule 09/23/2015  . Cough 09/23/2015  . Pneumonitis   . Coronary artery disease involving native coronary artery of native heart with unstable angina pectoris ([place])   . NSTEMI (non-ST elevated myocardial infarction) ([place])   . Chest pain 09/10/2015  . 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 Osteopenia 01/20/2012  . Hx of adenomatous colonic polyps 01/20/2012  . GE reflux 01/20/2012  . Vitamin D deficiency 01/20/2012  . Low serum testosterone level 01/20/2012  . Erectile dysfunction 01/20/2012  . Impaired glucose tolerance 01/20/2012  . Chronic back pain 01/20/2012  . Depression 02/01/2011  . ATTENTION OR CONCENTRATION DEFICIT 01/11/2010  . HYPERCHOLESTEROLEMIA 11/30/2008  . Essential hypertension 11/30/2008  . DEGENERATIVE JOINT DISEASE 11/30/2008    Past Surgical History:  Procedure Laterality Date  . BACK SURGERY     2 surgeries  . CARDIAC CATHETERIZATION N/A 09/12/2015   Procedure: Left Heart Cath and Coronary Angiography;  Surgeon: Roderic Palau  Adora Fridge, MD;  Location: [facility];  Service: Cardiovascular;  Laterality: N/A;  . CORONARY STENT INTERVENTION N/A 06/23/2019   Procedure: CORONARY STENT INTERVENTION;  Surgeon: Sherren Mocha, MD;  Location: [facility];  Service: Cardiovascular;  Laterality: N/A;  . LEFT HEART CATH AND CORONARY ANGIOGRAPHY N/A 01/27/2018   Procedure: LEFT HEART CATH AND CORONARY ANGIOGRAPHY;  Surgeon: Jettie Booze, MD;  Location: [facility];  Service: Cardiovascular;  Laterality: N/A;  . LEFT HEART CATH AND CORONARY ANGIOGRAPHY N/A 06/23/2019   Procedure: LEFT HEART CATH AND CORONARY  ANGIOGRAPHY;  Surgeon: Sherren Mocha, MD;  Location: [facility];  Service: Cardiovascular;  Laterality: N/A;       Family History  Problem Relation Age of Onset  . Heart disease Mother   . Stroke Mother   . Heart attack Mother   . Emphysema Father     Social History   Tobacco Use  . Smoking status: Former Smoker    Years: 10.00    Types: Cigarettes    Quit date: 01/04/1978    Years since quitting: 42.2  . Smokeless tobacco: Never Used  Vaping Use  . Vaping Use: Never used  Substance Use Topics  . Alcohol use: No    Alcohol/week: 0.0 standard drinks    Comment: rarely  . Drug use: No    Home Medications Prior to Admission medications   Medication Sig Start Date End Date Taking? Authorizing Provider  aspirin EC 81 MG EC tablet Take 1 tablet (81 mg total) by mouth daily. 06/24/19   Cheryln Manly, NP  clopidogrel (PLAVIX) 75 MG tablet Take 1 tablet (75 mg total) by mouth daily. 03/23/20   [name], MD  Cyanocobalamin (VITAMIN B-12) 1000 MCG SUBL Place 1 each under the tongue every morning.    [provider]  ezetimibe (ZETIA) 10 MG tablet Take 1 tablet (10 mg total) by mouth daily. 09/15/19   [name], MD  nebivolol (BYSTOLIC) 2.5 MG tablet Take 1 tablet (2.5 mg total) by mouth daily. 03/02/20   [name], MD  nitroGLYCERIN (NITROSTAT) 0.4 MG SL tablet Place 0.4 mg under the tongue every 5 (five) minutes as needed for chest pain.    [provider]  oxyCODONE-acetaminophen (PERCOCET) 10-325 MG tablet Take 1 tablet by mouth every 6 (six) hours as needed for pain.  05/25/19   [provider]  pantoprazole (PROTONIX) 20 MG tablet Take 1 tablet (20 mg total) by mouth daily. 07/05/19 09/03/19  Darreld Mclean, PA-C  ranolazine (RANEXA) 500 MG 12 hr tablet TAKE 1 TABLET BY MOUTH TWICE A DAY 12/21/19   Burtis Junes, NP  rosuvastatin (CRESTOR) 5 MG tablet TAKE 1 TABLET BY MOUTH EVERY DAY 02/29/20   [name], MD     Allergies    Atorvastatin, Metadate cd [methylphenidate hcl], Other, Praluent [alirocumab], Pravastatin, Hydrocodone, Levofloxacin, Levofloxacin in d5w, and Morphine and related  Review of Systems   Review of Systems  Respiratory: Positive for shortness of breath.   Cardiovascular: Positive for chest pain.  All other systems reviewed and are negative.   Physical Exam Updated Vital Signs BP (!) 151/87   Pulse 65   Temp 97.9 F (36.6 C) (Oral)   Resp 16   Ht 6' (1.829 m)   Wt 92.5 kg   SpO2 96%   BMI 27.67 kg/m   Physical Exam Vitals and nursing note reviewed.  Constitutional:  General: He is not in acute distress.    Appearance: He is well-developed.  HENT:     Head: Normocephalic and atraumatic.     Comments: No facial tenderness or swelling noted    Ears:     Comments: Right cerumen impaction    Mouth/Throat:     Mouth: Mucous membranes are moist.  Eyes:     Conjunctiva/sclera: Conjunctivae normal.     Pupils: Pupils are equal, round, and reactive to light.  Cardiovascular:     Rate and Rhythm: Normal rate and regular rhythm.     Heart sounds: No murmur heard.   Pulmonary:     Effort: Pulmonary effort is normal. No respiratory distress.     Breath sounds: Normal breath sounds. No wheezing or rales.  Chest:     Chest wall: Tenderness present. No crepitus.       Comments: Breath sounds are equal bilaterally Abdominal:     General: Abdomen is flat. There is no distension.     Palpations: Abdomen is soft.     Tenderness: There is no abdominal tenderness. There is no right CVA tenderness, left CVA tenderness, guarding or rebound.  Musculoskeletal:        General: No tenderness. Normal range of motion.     Cervical back: Normal range of motion and neck supple. No rigidity or tenderness.     Right lower leg: No edema.     Left lower leg: No edema.     Comments: Small abrasion to the right fifth MCP.  Full rom of the pip,dip and mcp joint  Skin:     General: Skin is warm and dry.     Capillary Refill: Capillary refill takes less than 2 seconds.     Findings: No erythema or rash.  Neurological:     General: No focal deficit present.     Mental Status: He is alert and oriented to person, place, and time. Mental status is at baseline.  Psychiatric:        Mood and Affect: Mood normal.        Behavior: Behavior normal.        Thought Content: Thought content normal.     ED Results / Procedures / Treatments   Labs (all labs ordered are listed, but only abnormal results are displayed) Labs Reviewed - No data to display  EKG None  Radiology DG Ribs Unilateral W/Chest Right  Result Date: 03/23/2020 CLINICAL DATA:  Fall 1 hour ago with right rib pain, initial encounter EXAM: RIGHT RIBS AND CHEST - 3+ VIEW COMPARISON:  07/04/2019 FINDINGS: Cardiac shadow is stable. Aortic calcifications are again seen. The lungs are clear bilaterally. No pneumothorax is seen. No rib abnormality is noted. IMPRESSION: No definitive rib fracture is seen. Electronically Signed   By: Inez Catalina M.D.   On: 03/23/2020 19:17    Procedures Procedures (including critical care time)  Medications Ordered in ED Medications  oxyCODONE-acetaminophen (PERCOCET/ROXICET) 5-325 MG per tablet 1 tablet (has no administration in time range)  ondansetron (ZOFRAN-ODT) disintegrating tablet 4 mg (has no administration in time range)    ED Course  I have reviewed the triage vital signs and the nursing notes.  Pertinent labs & imaging results that were available during my care of the patient were reviewed by me and considered in my medical decision making (see chart for details).    MDM Rules/Calculators/A&P  Patient is a 69 year old male presenting today after a fall when he stepped on uneven pavement.  Patient landed directly on his right chest and is having severe pain since that time.  Patient is satting 96% on room air and was able to  ambulate in the department without difficulty but does describe a significant amount of pain with movement and deep breaths.  He has no abdominal pain and low suspicion for intra-abdominal injury.  Low suspicion for pneumothorax but concern for rib fractures.  Patient has no obvious facial injury and no localized pain.  He did not hit his head or lose consciousness and do not feel that he needs imaging of his head or face today.  He does take Brilinta and aspirin but again he awake for the entire event and denies hitting his head.  Patient given pain control and plain films pending.  7:41 PM CXR is neg.  Will d/c home with supportive care and pt can call his pain management doctor if the pain worsens for medication adjustment.  MDM Number of Diagnoses or Management Options   Amount and/or Complexity of Data Reviewed Tests in the radiology section of CPT: ordered and reviewed Independent visualization of images, tracings, or specimens: yes  Risk of Complications, Morbidity, and/or Mortality Presenting problems: moderate Diagnostic procedures: low Management options: low  Patient Progress Patient progress: improved    Final Clinical Impression(s) / ED Diagnoses Final diagnoses:  Chest wall contusion, right, initial encounter    Rx / DC Orders ED Discharge Orders    None       Blanchie Dessert, MD 03/23/20 1943

## 2020-04-23 ENCOUNTER — Encounter (HOSPITAL_BASED_OUTPATIENT_CLINIC_OR_DEPARTMENT_OTHER): Payer: Self-pay | Admitting: Emergency Medicine

## 2020-04-23 ENCOUNTER — Other Ambulatory Visit: Payer: Self-pay

## 2020-04-23 ENCOUNTER — Emergency Department (HOSPITAL_BASED_OUTPATIENT_CLINIC_OR_DEPARTMENT_OTHER): Payer: Medicare Other

## 2020-04-23 ENCOUNTER — Emergency Department (HOSPITAL_BASED_OUTPATIENT_CLINIC_OR_DEPARTMENT_OTHER)
Admission: EM | Admit: 2020-04-23 | Discharge: 2020-04-23 | Disposition: A | Discharge status: Home / Self Care | Payer: Medicare Other | Attending: Emergency Medicine | Admitting: Emergency Medicine

## 2020-04-23 DIAGNOSIS — S20211A Contusion of right front wall of thorax, initial encounter: Secondary | ICD-10-CM

## 2020-04-23 DIAGNOSIS — Z7982 Long term (current) use of aspirin: Secondary | ICD-10-CM | POA: Insufficient documentation

## 2020-04-23 DIAGNOSIS — Y92019 Unspecified place in single-family (private) house as the place of occurrence of the external cause: Secondary | ICD-10-CM | POA: Insufficient documentation

## 2020-04-23 DIAGNOSIS — S299XXA Unspecified injury of thorax, initial encounter: Secondary | ICD-10-CM | POA: Diagnosis present

## 2020-04-23 DIAGNOSIS — S0990XA Unspecified injury of head, initial encounter: Secondary | ICD-10-CM

## 2020-04-23 DIAGNOSIS — W19XXXA Unspecified fall, initial encounter: Secondary | ICD-10-CM

## 2020-04-23 DIAGNOSIS — Z955 Presence of coronary angioplasty implant and graft: Secondary | ICD-10-CM | POA: Diagnosis not present

## 2020-04-23 DIAGNOSIS — W108XXA Fall (on) (from) other stairs and steps, initial encounter: Secondary | ICD-10-CM | POA: Insufficient documentation

## 2020-04-23 DIAGNOSIS — Z7902 Long term (current) use of antithrombotics/antiplatelets: Secondary | ICD-10-CM | POA: Diagnosis not present

## 2020-04-23 DIAGNOSIS — I2510 Atherosclerotic heart disease of native coronary artery without angina pectoris: Secondary | ICD-10-CM | POA: Diagnosis not present

## 2020-04-23 DIAGNOSIS — Z79899 Other long term (current) drug therapy: Secondary | ICD-10-CM | POA: Insufficient documentation

## 2020-04-23 DIAGNOSIS — Z87891 Personal history of nicotine dependence: Secondary | ICD-10-CM | POA: Insufficient documentation

## 2020-04-23 DIAGNOSIS — I10 Essential (primary) hypertension: Secondary | ICD-10-CM | POA: Insufficient documentation

## 2020-04-23 MED ORDER — HYDROMORPHONE HCL 1 MG/ML IJ SOLN
1.0000 mg | Freq: Once | INTRAMUSCULAR | Status: AC
  Administered 2020-04-23: 1 mg via INTRAMUSCULAR
  Filled 2020-04-23: qty 1

## 2020-04-23 MED ORDER — ONDANSETRON 4 MG PO TBDP
4.0000 mg | ORAL_TABLET | Freq: Once | ORAL | Status: AC
  Administered 2020-04-23: 4 mg via ORAL
  Filled 2020-04-23: qty 1

## 2020-04-23 NOTE — ED Notes (Signed)
Pt waiting in car, call [phone]

## 2020-04-23 NOTE — ED Provider Notes (Signed)
[name] EMERGENCY DEPARTMENT Provider Note   CSN: [id number] Arrival date & time: 04/23/20  1426     History Chief Complaint  Patient presents with  . Fall    Johnny [name] is a 69 y.o. male.  Pt presents to the ED today with a fall.  The pt said he was walking down the stairs and missed a step.  The pt fell down 4-5 stairs landing on his right side.  He did hit his head on the door frame, but did not have a loc.  He is on Plavix now.  He just stopped Brilinta yesterday.  No loc.  He has not had any n/v.  He is acting normally.        Past Medical History:  Diagnosis Date  . Adenomatous polyps   . Bronchitis   . CAD in native artery 2017  . Chronic back pain   . GERD (gastroesophageal reflux disease)   . Hyperlipidemia   . Hypertension   . Internal hemorrhoids   . Low testosterone   . Myocardial infarction ([place])   . Osteopenia   . Shortness of breath dyspnea   . Spondylosis   . TIA (transient ischemic attack) 2008  . Ureterolithiasis   . Ventral hernia   . Vitamin D deficiency     Patient Active Problem List   Diagnosis Date Noted  . Atelectasis of both lungs 01/26/2018  . 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 Unstable angina ([place])   . TIA (transient ischemic attack) 01/25/2018  . Exertional dyspnea 09/23/2015  . CAP (community acquired pneumonia) 09/23/2015  . Lung nodule 09/23/2015  . Cough 09/23/2015  . Pneumonitis   . Coronary artery disease involving native coronary artery of native heart with unstable angina pectoris ([place])   . NSTEMI (non-ST elevated myocardial infarction) ([place])   . Chest pain 09/10/2015  . 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 Osteopenia 01/20/2012  . Hx of adenomatous colonic polyps 01/20/2012  . GE reflux 01/20/2012  . Vitamin D deficiency 01/20/2012  . Low serum testosterone level 01/20/2012  . Erectile dysfunction 01/20/2012  . Impaired glucose tolerance 01/20/2012  . Chronic back pain 01/20/2012  . Depression 02/01/2011  .  ATTENTION OR CONCENTRATION DEFICIT 01/11/2010  . HYPERCHOLESTEROLEMIA 11/30/2008  . Essential hypertension 11/30/2008  . DEGENERATIVE JOINT DISEASE 11/30/2008    Past Surgical History:  Procedure Laterality Date  . BACK SURGERY     2 surgeries  . CARDIAC CATHETERIZATION N/A 09/12/2015   Procedure: Left Heart Cath and Coronary Angiography;  Surgeon: Lorretta Harp, MD;  Location: [facility];  Service: Cardiovascular;  Laterality: N/A;  . CORONARY STENT INTERVENTION N/A 06/23/2019   Procedure: CORONARY STENT INTERVENTION;  Surgeon: Sherren Mocha, MD;  Location: [facility];  Service: Cardiovascular;  Laterality: N/A;  . LEFT HEART CATH AND CORONARY ANGIOGRAPHY N/A 01/27/2018   Procedure: LEFT HEART CATH AND CORONARY ANGIOGRAPHY;  Surgeon: Jettie Booze, MD;  Location: [facility];  Service: Cardiovascular;  Laterality: N/A;  . LEFT HEART CATH AND CORONARY ANGIOGRAPHY N/A 06/23/2019   Procedure: LEFT HEART CATH AND CORONARY ANGIOGRAPHY;  Surgeon: Sherren Mocha, MD;  Location: [facility];  Service: Cardiovascular;  Laterality: N/A;       Family History  Problem Relation Age of Onset  . Heart disease Mother   . Stroke Mother   . Heart attack Mother   . Emphysema Father     Social History   Tobacco Use  .  Smoking status: Former Smoker    Years: 10.00    Types: Cigarettes    Quit date: 01/04/1978    Years since quitting: 42.3  . Smokeless tobacco: Never Used  Vaping Use  . Vaping Use: Never used  Substance Use Topics  . Alcohol use: No    Alcohol/week: 0.0 standard drinks    Comment: rarely  . Drug use: No    Home Medications Prior to Admission medications   Medication Sig Start Date End Date Taking? Authorizing Provider  aspirin EC 81 MG EC tablet Take 1 tablet (81 mg total) by mouth daily. 06/24/19   Cheryln Manly, NP  clopidogrel (PLAVIX) 75 MG tablet Take 1 tablet (75 mg total) by mouth daily. 03/23/20   [name], MD   Cyanocobalamin (VITAMIN B-12) 1000 MCG SUBL Place 1 each under the tongue every morning.    [provider]  ezetimibe (ZETIA) 10 MG tablet Take 1 tablet (10 mg total) by mouth daily. 09/15/19   [name], MD  nebivolol (BYSTOLIC) 2.5 MG tablet Take 1 tablet (2.5 mg total) by mouth daily. 03/02/20   [name], MD  nitroGLYCERIN (NITROSTAT) 0.4 MG SL tablet Place 0.4 mg under the tongue every 5 (five) minutes as needed for chest pain.    [provider]  oxyCODONE-acetaminophen (PERCOCET) 10-325 MG tablet Take 1 tablet by mouth every 6 (six) hours as needed for pain.  05/25/19   [provider]  pantoprazole (PROTONIX) 20 MG tablet Take 1 tablet (20 mg total) by mouth daily. 07/05/19 09/03/19  Darreld Mclean, PA-C  ranolazine (RANEXA) 500 MG 12 hr tablet TAKE 1 TABLET BY MOUTH TWICE A DAY 12/21/19   Burtis Junes, NP  rosuvastatin (CRESTOR) 5 MG tablet TAKE 1 TABLET BY MOUTH EVERY DAY 02/29/20   [name], MD    Allergies    Atorvastatin, Metadate cd [methylphenidate hcl], Other, Praluent [alirocumab], Pravastatin, Hydrocodone, Levofloxacin, Levofloxacin in d5w, and Morphine and related  Review of Systems   Review of Systems  Musculoskeletal:       Right chest wall pain  Neurological: Positive for headaches.  All other systems reviewed and are negative.   Physical Exam Updated Vital Signs BP (!) 151/93 (BP Location: Right Arm)   Pulse (!) 53   Temp 98.4 F (36.9 C) (Oral)   Resp 18   Ht 6' (1.829 m)   Wt 93.4 kg   SpO2 100%   BMI 27.94 kg/m   Physical Exam Vitals and nursing note reviewed.  Constitutional:      Appearance: Normal appearance.  HENT:     Head: Normocephalic and atraumatic.     Right Ear: External ear normal.     Left Ear: External ear normal.     Nose: Nose normal.     Mouth/Throat:     Mouth: Mucous membranes are moist.     Pharynx: Oropharynx is clear.  Eyes:     Extraocular Movements: Extraocular movements  intact.     Conjunctiva/sclera: Conjunctivae normal.     Pupils: Pupils are equal, round, and reactive to light.  Cardiovascular:     Rate and Rhythm: Normal rate and regular rhythm.     Pulses: Normal pulses.     Heart sounds: Normal heart sounds.  Pulmonary:     Effort: Pulmonary effort is normal.     Breath sounds: Normal breath sounds.  Chest:    Abdominal:     General: Abdomen is flat.  Bowel sounds are normal.     Palpations: Abdomen is soft.  Musculoskeletal:     Cervical back: Normal range of motion and neck supple.  Skin:    General: Skin is warm.     Capillary Refill: Capillary refill takes less than 2 seconds.  Neurological:     General: No focal deficit present.     Mental Status: He is alert and oriented to person, place, and time.  Psychiatric:        Mood and Affect: Mood normal.        Behavior: Behavior normal.     ED Results / Procedures / Treatments   Labs (all labs ordered are listed, but only abnormal results are displayed) Labs Reviewed - No data to display  EKG None  Radiology DG Ribs Unilateral W/Chest Right  Result Date: 04/23/2020 CLINICAL DATA:  [address] downstairs EXAM: RIGHT RIBS AND CHEST - 3+ VIEW COMPARISON:  03/23/2020 FINDINGS: Frontal view of the chest as well as frontal and oblique views of the right thoracic cage are obtained. Cardiac silhouette is unremarkable. No airspace disease, effusion, or pneumothorax. There are no acute displaced rib fractures. IMPRESSION: 1. No acute intrathoracic process.  No acute fractures. Electronically Signed   By: Randa Ngo M.D.   On: 04/23/2020 17:13   CT Head Wo Contrast  Result Date: 04/23/2020 CLINICAL DATA:  [address] downstairs, right-sided bruising EXAM: CT HEAD WITHOUT CONTRAST TECHNIQUE: Contiguous axial images were obtained from the base of the skull through the vertex without intravenous contrast. COMPARISON:  07/01/2014 FINDINGS: Brain: Stable white matter hypodensities most consistent with  chronic small vessel ischemic changes, most pronounced in the left frontal and insular regions. No signs of acute infarct or hemorrhage. Lateral ventricles and remaining midline structures are unremarkable. No acute extra-axial fluid collections. No mass effect. Vascular: No hyperdense vessel or unexpected calcification. Skull: Normal. Negative for fracture or focal lesion. Sinuses/Orbits: No acute finding. Other: None. IMPRESSION: 1. Chronic small vessel ischemic changes within the white matter. No acute intracranial process. Electronically Signed   By: Randa Ngo M.D.   On: 04/23/2020 17:12    Procedures Procedures (including critical care time)  Medications Ordered in ED Medications  HYDROmorphone (DILAUDID) injection 1 mg (1 mg Intramuscular Given 04/23/20 2046)  ondansetron (ZOFRAN-ODT) disintegrating tablet 4 mg (4 mg Oral Given 04/23/20 2045)    ED Course  I have reviewed the triage vital signs and the nursing notes.  Pertinent labs & imaging results that were available during my care of the patient were reviewed by me and considered in my medical decision making (see chart for details).    MDM Rules/Calculators/A&P                          CT head neg.  Rib films neg.  Pt given dilaudid for pain and is also given an incentive spirometer.  Pt is stable for d/c.  Return if worse. Final Clinical Impression(s) / ED Diagnoses Final diagnoses:  Fall, initial encounter  Contusion of right chest wall, initial encounter  Injury of head, initial encounter    Rx / DC Orders ED Discharge Orders    None       Isla Pence, MD 04/23/20 2053

## 2020-04-23 NOTE — ED Triage Notes (Signed)
Pt arrives pov with wife with c/o fall down stairs, 4-5 stairs, landing on bottle. Bruising noted to right side. Pt also reports hitting head on door frame, denies loc. Endorses plavix daily

## 2020-05-20 ENCOUNTER — Other Ambulatory Visit: Payer: Self-pay | Admitting: Internal Medicine

## 2020-06-10 ENCOUNTER — Other Ambulatory Visit: Payer: Medicare Other | Admitting: Internal Medicine

## 2020-06-13 ENCOUNTER — Encounter: Payer: Medicare Other | Admitting: Internal Medicine

## 2020-06-14 ENCOUNTER — Other Ambulatory Visit: Payer: Self-pay

## 2020-06-14 ENCOUNTER — Other Ambulatory Visit: Payer: Medicare Other | Admitting: Internal Medicine

## 2020-06-14 DIAGNOSIS — R7302 Impaired glucose tolerance (oral): Secondary | ICD-10-CM

## 2020-06-14 DIAGNOSIS — I10 Essential (primary) hypertension: Secondary | ICD-10-CM

## 2020-06-14 DIAGNOSIS — K2100 Gastro-esophageal reflux disease with esophagitis, without bleeding: Secondary | ICD-10-CM

## 2020-06-14 DIAGNOSIS — Z0000 Encounter for general adult medical examination without abnormal findings: Secondary | ICD-10-CM

## 2020-06-14 DIAGNOSIS — M8580 Other specified disorders of bone density and structure, unspecified site: Secondary | ICD-10-CM

## 2020-06-14 DIAGNOSIS — Z125 Encounter for screening for malignant neoplasm of prostate: Secondary | ICD-10-CM

## 2020-06-14 DIAGNOSIS — E782 Mixed hyperlipidemia: Secondary | ICD-10-CM

## 2020-06-14 DIAGNOSIS — Z87442 Personal history of urinary calculi: Secondary | ICD-10-CM

## 2020-06-15 LAB — COMPLETE METABOLIC PANEL WITH GFR
AG Ratio: 1.5 (calc) (ref 1.0–2.5)
ALT: 13 U/L (ref 9–46)
AST: 14 U/L (ref 10–35)
Albumin: 4.4 g/dL (ref 3.6–5.1)
Alkaline phosphatase (APISO): 54 U/L (ref 35–144)
BUN: 15 mg/dL (ref 7–25)
CO2: 25 mmol/L (ref 20–32)
Calcium: 10.3 mg/dL (ref 8.6–10.3)
Chloride: 104 mmol/L (ref 98–110)
Creat: 1.03 mg/dL (ref 0.70–1.25)
GFR, Est African American: 87 mL/min/{1.73_m2} (ref >=60)
GFR, Est Non African American: 75 mL/min/{1.73_m2} (ref >=60)
Globulin: 2.9 g/dL (calc) (ref 1.9–3.7)
Glucose, Bld: 86 mg/dL (ref 65–99)
Potassium: 4.3 mmol/L (ref 3.5–5.3)
Sodium: 140 mmol/L (ref 135–146)
Total Bilirubin: 0.8 mg/dL (ref 0.2–1.2)
Total Protein: 7.3 g/dL (ref 6.1–8.1)

## 2020-06-15 LAB — CBC WITH DIFFERENTIAL/PLATELET
Absolute Monocytes: 732 cells/uL (ref 200–950)
Basophils Absolute: 48 cells/uL (ref 0–200)
Basophils Relative: 0.5 %
Eosinophils Absolute: 76 cells/uL (ref 15–500)
Eosinophils Relative: 0.8 %
HCT: 41.9 % (ref 38.5–50.0)
Hemoglobin: 14.6 g/dL (ref 13.2–17.1)
Lymphs Abs: 1834 cells/uL (ref 850–3900)
MCH: 33.3 pg — ABNORMAL HIGH (ref 27.0–33.0)
MCHC: 34.8 g/dL (ref 32.0–36.0)
MCV: 95.7 fL (ref 80.0–100.0)
MPV: 9.8 fL (ref 7.5–12.5)
Monocytes Relative: 7.7 %
Neutro Abs: 6812 cells/uL (ref 1500–7800)
Neutrophils Relative %: 71.7 %
Platelets: 241 10*3/uL (ref 140–400)
RBC: 4.38 10*6/uL (ref 4.20–5.80)
RDW: 11.9 % (ref 11.0–15.0)
Total Lymphocyte: 19.3 %
WBC: 9.5 10*3/uL (ref 3.8–10.8)

## 2020-06-15 LAB — HEMOGLOBIN A1C
Hgb A1c MFr Bld: 5.3 % of total Hgb (ref <5.7)
Mean Plasma Glucose: 105 mg/dL
eAG (mmol/L): 5.8 mmol/L

## 2020-06-15 LAB — LIPID PANEL
Cholesterol: 177 mg/dL (ref <200)
HDL: 44 mg/dL (ref >=40)
LDL Cholesterol (Calc): 103 mg/dL (calc) — ABNORMAL HIGH
Non-HDL Cholesterol (Calc): 133 mg/dL (calc) — ABNORMAL HIGH (ref <130)
Total CHOL/HDL Ratio: 4 (calc) (ref <5.0)
Triglycerides: 188 mg/dL — ABNORMAL HIGH (ref <150)

## 2020-06-15 LAB — MICROALBUMIN / CREATININE URINE RATIO
Creatinine, Urine: 110 mg/dL (ref 20–320)
Microalb Creat Ratio: 6 mcg/mg creat (ref <30)
Microalb, Ur: 0.7 mg/dL

## 2020-06-15 LAB — PSA: PSA: 0.57 ng/mL (ref <=4.0)

## 2020-06-17 ENCOUNTER — Encounter: Payer: Self-pay | Admitting: Internal Medicine

## 2020-06-17 ENCOUNTER — Other Ambulatory Visit: Payer: Self-pay

## 2020-06-17 ENCOUNTER — Ambulatory Visit (INDEPENDENT_AMBULATORY_CARE_PROVIDER_SITE_OTHER): Payer: Medicare Other | Admitting: Internal Medicine

## 2020-06-17 VITALS — BP 126/80 | HR 76 | Temp 98.0°F | Resp 12 | Ht 72.0 in | Wt 202.0 lb

## 2020-06-17 DIAGNOSIS — Z0000 Encounter for general adult medical examination without abnormal findings: Secondary | ICD-10-CM | POA: Diagnosis not present

## 2020-06-17 DIAGNOSIS — Z125 Encounter for screening for malignant neoplasm of prostate: Secondary | ICD-10-CM

## 2020-06-17 DIAGNOSIS — Z87442 Personal history of urinary calculi: Secondary | ICD-10-CM

## 2020-06-17 DIAGNOSIS — E782 Mixed hyperlipidemia: Secondary | ICD-10-CM

## 2020-06-17 DIAGNOSIS — I10 Essential (primary) hypertension: Secondary | ICD-10-CM

## 2020-06-17 DIAGNOSIS — K2100 Gastro-esophageal reflux disease with esophagitis, without bleeding: Secondary | ICD-10-CM | POA: Diagnosis not present

## 2020-06-17 DIAGNOSIS — N401 Enlarged prostate with lower urinary tract symptoms: Secondary | ICD-10-CM

## 2020-06-17 DIAGNOSIS — R351 Nocturia: Secondary | ICD-10-CM

## 2020-06-17 DIAGNOSIS — R918 Other nonspecific abnormal finding of lung field: Secondary | ICD-10-CM

## 2020-06-17 DIAGNOSIS — I252 Old myocardial infarction: Secondary | ICD-10-CM

## 2020-06-17 DIAGNOSIS — Z86010 Personal history of colonic polyps: Secondary | ICD-10-CM

## 2020-06-17 DIAGNOSIS — Z8659 Personal history of other mental and behavioral disorders: Secondary | ICD-10-CM

## 2020-06-17 LAB — POCT URINALYSIS DIPSTICK
Appearance: NEGATIVE
Bilirubin, UA: NEGATIVE
Blood, UA: NEGATIVE
Glucose, UA: NEGATIVE
Ketones, UA: NEGATIVE
Leukocytes, UA: NEGATIVE
Nitrite, UA: NEGATIVE
Odor: NEGATIVE
Protein, UA: NEGATIVE
Spec Grav, UA: 1.01 (ref 1.010–1.025)
Urobilinogen, UA: 0.2 E.U./dL
pH, UA: 6.5 (ref 5.0–8.0)

## 2020-06-17 MED ORDER — TAMSULOSIN HCL 0.4 MG PO CAPS: 0.4000 mg | ORAL_CAPSULE | Freq: Every day | ORAL | 3 refills | Status: DC

## 2020-06-17 NOTE — Patient Instructions (Addendum)
It was a pleasure to see you today. Watch diet and try to walk daily. Triglycerides are elevated a bit. Restart Flomax for BPH. Please have Covid-19 vaccines. See Dr. Michail Sermon for colonscopy follow up soon.

## 2020-06-17 NOTE — Progress Notes (Signed)
Subjective:    Patient ID: Johnny [name], male    DOB: 18-Mar-1951, 70 y.o.   MRN: [id number]  HPI  70 year old Male for health maintenance exam and evaluation of medical issues.  Patient is due for follow up colonoscopy with Dr. Michail Sermon- has received letter and patient will call [facility].  He has a history of adenomatous polyps.  Patient has a history of osteopenia, hypertension, hyperlipidemia, internal hemorrhoids, low testosterone, GE reflux, vitamin D deficiency and prediabetes.  Hx ADD and asking about med for ADD issues.  Sometimes has trouble remembering and is easily distracted. Do not think amphetamines indicated at age and can just keep lists of things he needs to remember.  Admitted for chest pain September 2019.  Chest pain started while he was trying to help his son who was in a wheelchair to get out of the house.  MI was ruled out.  Subsequently had cardiac cath and was found to have normal left ventricular systolic function and grade 1 diastolic dysfunction.  Repeat cath showed no significant change from 2017 and medical management was recommended.  History of NSTEMI 2017.  He had cath by Dr. Gwenlyn Found who noted 50% mid LAD, 95% ramus and 90% first diagonal stenoses.  He was placed on medical management including Crestor, beta-blocker and Imdur.  History of mixed hyperlipidemia.  History of 5 mm right middle lobe nodule on chest CT that was discovered during renal stone study in 2016.  He was referred to pulmonologist.  History of recurrent kidney stones 2017.  He had upper endoscopy by Dr. 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  History of GE reflux and hiatal hernia.  Had adenomatous colon polyps on colonoscopy in July 2005 but repeat study in June 2010 was normal.  History of vitamin D deficiency, prediabetes and osteopenia.  He smoked in the remote past but quit around 1979.  He had anal warts removed in 2005  Lumbar surgery with  Dr. Carloyn Manner 2010 related to left lumbar radiculopathy with disc at L4-L5 and L5-S1.  Social history: He is married.  He had a son involved in a serious motor vehicle accident which took over a year to recover.  His wife has multiple sclerosis and is disabled.  He is a retired Agricultural consultant.  He does not smoke.  Very occasional alcohol consumption.  2 other adult children.    Review of Systems  Respiratory: Negative.   Cardiovascular: Negative.   Gastrointestinal: Negative.   Genitourinary: Negative.   Neurological: Negative.        Objective:   Physical Exam Skin is warm and dry.  No cervical adenopathy.  TMs clear.  Neck supple.  No carotid bruits.  Chest clear to auscultation.  Cardiac exam: Regular rate and rhythm normal S1 and S2.  Abdomen is soft nondistended without hepatosplenomegaly masses or tenderness.  No lower extremity pitting edema.  Prostate is normal without nodules.       Assessment & Plan:  History of coronary artery disease-cardiology follows him regularly.  He had NSTEMI 2017 was placed on medical management.  He is on Plavix and aspirin.  He is on Ranexa.  BMI 29.43-needs to work on diet and exercise  History of attention deficit disorder-do not want to treat with amphetamines  Hyperlipidemia treated with Zetia.  Also has been treated with Crestor.  History of osteopenia  History of adenomatous colon polyps-needs to call Dr. Michail Sermon about repeating colonoscopy  History of  low testosterone treated with AndroGel  GE reflux treated with PPI  BPH-restart Flomax  Hypertension treated by cardiology-he is on low-dose Bystolic 2.5 mg daily  History of impaired glucose tolerance but more recently hemoglobin A1c's have been normal  History of 5 mm nodule right middle lobe with previous chest CT showed this nodule to be stable-last done February 2021.  4 mm nodule noted in left lower lobe unchanged.  Is to have repeat study February 2022.  BPH treated with  Flomax.  History of kidney stones  Plan: Return here in 1 year or as needed.  Work on diet and exercise.  Subjective:   Patient presents for Medicare Annual/Subsequent preventive examination.  Review Past Medical/Family/Social: See above  Risk Factors  Current exercise habits: Light exercise Dietary issues discussed: Low-fat low carbohydrate discussed  Cardiac risk factors: History of NSTEMI, hyperlipidemia  Depression Screen  (Note: if answer to either of the following is "Yes", a more complete depression screening is indicated)   Over the past two weeks, have you felt down, depressed or hopeless? No  Over the past two weeks, have you felt little interest or pleasure in doing things? No Have you lost interest or pleasure in daily life? No Do you often feel hopeless? No Do you cry easily over simple problems? No   Activities of Daily Living  In your present state of health, do you have any difficulty performing the following activities?:   Driving? No  Managing money? No  Feeding yourself? No  Getting from bed to chair? No  Climbing a flight of stairs? No  Preparing food and eating?: No  Bathing or showering? No  Getting dressed: No  Getting to the toilet? No  Using the toilet:No  Moving around from place to place: No  In the past year have you fallen or had a near fall?:  Yes Are you sexually active?  Do you have more than one partner? No   Hearing Difficulties: Yes sometimes Do you often ask people to speak up or repeat themselves?  Yes Do you experience ringing or noises in your ears?  Yes Do you have difficulty understanding soft or whispered voices?  Yes at times Do you feel that you have a problem with memory?  Yes I think because of attention deficit Do you often misplace items?  Yes sometimes   Home Safety:  Do you have a smoke alarm at your residence? Yes Do you have grab bars in the bathroom?  None Do you have throw rugs in your house?   None   Cognitive Testing  Alert? Yes Normal Appearance?Yes  Oriented to person? Yes Place? Yes  Time? Yes  Recall of three objects? Yes  Can perform simple calculations? Yes  Displays appropriate judgment?Yes  Can read the correct time from a watch face?Yes   List the Names of Other Physician/Practitioners you currently use:  See referral list for the physicians patient is currently seeing.  Cardiology  Gastroenterology   Review of Systems: See above   Objective:     General appearance: Appears stated age and mildly obese  Head: Normocephalic, without obvious abnormality, atraumatic  Eyes: conj clear, EOMi PEERLA  Ears: normal TM's and external ear canals both ears  Nose: Nares normal. Septum midline. Mucosa normal. No drainage or sinus tenderness.  Throat: lips, mucosa, and tongue normal; teeth and gums normal  Neck: no adenopathy, no carotid bruit, no JVD, supple, symmetrical, trachea midline and thyroid not enlarged, symmetric, no tenderness/mass/nodules  No CVA tenderness.  Lungs: clear to auscultation bilaterally  Breasts: Normal male Heart: regular rate and rhythm, S1, S2 normal, no murmur, click, rub or gallop  Abdomen: soft, non-tender; bowel sounds normal; no masses, no organomegaly  Musculoskeletal: ROM normal in all joints, no crepitus, no deformity, Normal muscle strengthen. Back  is symmetric, no curvature. Skin: Skin color, texture, turgor normal. No rashes or lesions  Lymph nodes: Cervical, supraclavicular, and axillary nodes normal.  Neurologic: CN 2 -12 Normal, Normal symmetric reflexes. Normal coordination and gait  Psych: Alert & Oriented x 3, Mood appear stable.    Assessment:    Annual wellness medicare exam   Plan:    During the course of the visit the patient was educated and counseled about appropriate screening and preventive services including:   By our records has not been vaccinated for COVID-19.  Had Tdap in 2013     Patient  Instructions (the written plan) was given to the patient.  Medicare Attestation  I have personally reviewed:  The patient's medical and social history  Their use of alcohol, tobacco or illicit drugs  Their current medications and supplements  The patient's functional ability including ADLs,fall risks, home safety risks, cognitive, and hearing and visual impairment  Diet and physical activities  Evidence for depression or mood disorders  The patient's weight, height, BMI, and visual acuity have been recorded in the chart. I have made referrals, counseling, and provided education to the patient based on review of the above and I have provided the patient with a written personalized care plan for preventive services.

## 2020-06-24 ENCOUNTER — Observation Stay (HOSPITAL_BASED_OUTPATIENT_CLINIC_OR_DEPARTMENT_OTHER): Payer: Medicare Other

## 2020-06-24 ENCOUNTER — Other Ambulatory Visit: Payer: Self-pay

## 2020-06-24 ENCOUNTER — Emergency Department (HOSPITAL_COMMUNITY): Payer: Medicare Other

## 2020-06-24 ENCOUNTER — Encounter (HOSPITAL_COMMUNITY): Payer: Self-pay | Admitting: Cardiology

## 2020-06-24 ENCOUNTER — Observation Stay (HOSPITAL_COMMUNITY)
Admission: EM | Admit: 2020-06-24 | Discharge: 2020-06-25 | Disposition: A | Discharge status: Home / Self Care | Payer: Medicare Other | Attending: Cardiology | Admitting: Cardiology

## 2020-06-24 ENCOUNTER — Encounter (HOSPITAL_COMMUNITY)
Admission: EM | Disposition: A | Discharge status: Home / Self Care | Payer: Self-pay | Source: Home / Self Care | Attending: Emergency Medicine

## 2020-06-24 DIAGNOSIS — E7800 Pure hypercholesterolemia, unspecified: Secondary | ICD-10-CM | POA: Diagnosis not present

## 2020-06-24 DIAGNOSIS — I200 Unstable angina: Secondary | ICD-10-CM | POA: Diagnosis present

## 2020-06-24 DIAGNOSIS — Z20822 Contact with and (suspected) exposure to covid-19: Secondary | ICD-10-CM | POA: Diagnosis not present

## 2020-06-24 DIAGNOSIS — E785 Hyperlipidemia, unspecified: Secondary | ICD-10-CM

## 2020-06-24 DIAGNOSIS — R079 Chest pain, unspecified: Secondary | ICD-10-CM | POA: Diagnosis not present

## 2020-06-24 DIAGNOSIS — Z87891 Personal history of nicotine dependence: Secondary | ICD-10-CM | POA: Diagnosis not present

## 2020-06-24 DIAGNOSIS — I10 Essential (primary) hypertension: Secondary | ICD-10-CM | POA: Diagnosis not present

## 2020-06-24 DIAGNOSIS — Z7902 Long term (current) use of antithrombotics/antiplatelets: Secondary | ICD-10-CM | POA: Insufficient documentation

## 2020-06-24 DIAGNOSIS — Z7982 Long term (current) use of aspirin: Secondary | ICD-10-CM | POA: Insufficient documentation

## 2020-06-24 DIAGNOSIS — I252 Old myocardial infarction: Secondary | ICD-10-CM | POA: Insufficient documentation

## 2020-06-24 DIAGNOSIS — I25110 Atherosclerotic heart disease of native coronary artery with unstable angina pectoris: Secondary | ICD-10-CM | POA: Diagnosis not present

## 2020-06-24 DIAGNOSIS — Z79899 Other long term (current) drug therapy: Secondary | ICD-10-CM | POA: Diagnosis not present

## 2020-06-24 DIAGNOSIS — R0789 Other chest pain: Principal | ICD-10-CM | POA: Insufficient documentation

## 2020-06-24 HISTORY — PX: LEFT HEART CATH AND CORONARY ANGIOGRAPHY: CATH118249

## 2020-06-24 LAB — MAGNESIUM: Magnesium: 2 mg/dL (ref 1.7–2.4)

## 2020-06-24 LAB — COMPREHENSIVE METABOLIC PANEL
ALT: 15 U/L (ref 0–44)
AST: 20 U/L (ref 15–41)
Albumin: 3.4 g/dL — ABNORMAL LOW (ref 3.5–5.0)
Alkaline Phosphatase: 45 U/L (ref 38–126)
Anion gap: 10 (ref 5–15)
BUN: 11 mg/dL (ref 8–23)
CO2: 21 mmol/L — ABNORMAL LOW (ref 22–32)
Calcium: 9.4 mg/dL (ref 8.9–10.3)
Chloride: 106 mmol/L (ref 98–111)
Creatinine, Ser: 1.05 mg/dL (ref 0.61–1.24)
GFR, Estimated: >60 mL/min (ref >60)
Glucose, Bld: 92 mg/dL (ref 70–99)
Potassium: 3.7 mmol/L (ref 3.5–5.1)
Sodium: 137 mmol/L (ref 135–145)
Total Bilirubin: 0.8 mg/dL (ref 0.3–1.2)
Total Protein: 6.5 g/dL (ref 6.5–8.1)

## 2020-06-24 LAB — CBC
HCT: 38.4 % — ABNORMAL LOW (ref 39.0–52.0)
HCT: 38.5 % — ABNORMAL LOW (ref 39.0–52.0)
Hemoglobin: 12.6 g/dL — ABNORMAL LOW (ref 13.0–17.0)
Hemoglobin: 12.3 g/dL — ABNORMAL LOW (ref 13.0–17.0)
MCH: 32.8 pg (ref 26.0–34.0)
MCH: 32.5 pg (ref 26.0–34.0)
MCHC: 32.8 g/dL (ref 30.0–36.0)
MCHC: 31.9 g/dL (ref 30.0–36.0)
MCV: 100 fL (ref 80.0–100.0)
MCV: 101.9 fL — ABNORMAL HIGH (ref 80.0–100.0)
Platelets: 188 10*3/uL (ref 150–400)
Platelets: 193 10*3/uL (ref 150–400)
RBC: 3.84 MIL/uL — ABNORMAL LOW (ref 4.22–5.81)
RBC: 3.78 MIL/uL — ABNORMAL LOW (ref 4.22–5.81)
RDW: 12 % (ref 11.5–15.5)
RDW: 12 % (ref 11.5–15.5)
WBC: 10.2 10*3/uL (ref 4.0–10.5)
WBC: 7.7 10*3/uL (ref 4.0–10.5)
nRBC: 0 % (ref 0.0–0.2)
nRBC: 0 % (ref 0.0–0.2)

## 2020-06-24 LAB — ECHOCARDIOGRAM COMPLETE
Area-P 1/2: 2.24 cm2
Calc EF: 64.9 %
Height: 72 in
Single Plane A2C EF: 65.2 %
Single Plane A4C EF: 65.2 %
Weight: 3231.06 oz

## 2020-06-24 LAB — SARS CORONAVIRUS 2 BY RT PCR (HOSPITAL ORDER, PERFORMED IN ~~LOC~~ HOSPITAL LAB): SARS Coronavirus 2: NEGATIVE

## 2020-06-24 LAB — TROPONIN I (HIGH SENSITIVITY)
Troponin I (High Sensitivity): 3 ng/L (ref <18)
Troponin I (High Sensitivity): 4 ng/L (ref <18)

## 2020-06-24 LAB — CREATININE, SERUM
Creatinine, Ser: 1 mg/dL (ref 0.61–1.24)
GFR, Estimated: >60 mL/min (ref >60)

## 2020-06-24 LAB — T4, FREE: Free T4: 0.98 ng/dL (ref 0.61–1.12)

## 2020-06-24 LAB — TSH: TSH: 0.664 u[IU]/mL (ref 0.350–4.500)

## 2020-06-24 SURGERY — LEFT HEART CATH AND CORONARY ANGIOGRAPHY
Anesthesia: LOCAL

## 2020-06-24 MED ORDER — ONDANSETRON HCL 4 MG/2ML IJ SOLN: 4.0000 mg | Freq: Four times a day (QID) | INTRAMUSCULAR | Status: DC | PRN

## 2020-06-24 MED ORDER — ISOSORBIDE MONONITRATE ER 30 MG PO TB24: 30.0000 mg | ORAL_TABLET | Freq: Every day | ORAL | Status: DC

## 2020-06-24 MED ORDER — CLOPIDOGREL BISULFATE 75 MG PO TABS
75.0000 mg | ORAL_TABLET | Freq: Every day | ORAL | Status: DC
  Administered 2020-06-24: 75 mg via ORAL
  Filled 2020-06-24: qty 1

## 2020-06-24 MED ORDER — VITAMIN B-12 100 MCG PO TABS
100.0000 ug | ORAL_TABLET | Freq: Every morning | ORAL | Status: DC
  Administered 2020-06-25: 100 ug via ORAL
  Filled 2020-06-24: qty 1

## 2020-06-24 MED ORDER — MIDAZOLAM HCL 2 MG/2ML IJ SOLN
INTRAMUSCULAR | Status: AC
  Filled 2020-06-24: qty 2

## 2020-06-24 MED ORDER — HYDRALAZINE HCL 20 MG/ML IJ SOLN: 10.0000 mg | INTRAMUSCULAR | Status: AC | PRN

## 2020-06-24 MED ORDER — HEPARIN SODIUM (PORCINE) 5000 UNIT/ML IJ SOLN
5000.0000 [IU] | Freq: Three times a day (TID) | INTRAMUSCULAR | Status: DC
  Administered 2020-06-24 – 2020-06-25 (×2): 5000 [IU] via SUBCUTANEOUS
  Filled 2020-06-24 (×2): qty 1

## 2020-06-24 MED ORDER — ROSUVASTATIN CALCIUM 20 MG PO TABS
20.0000 mg | ORAL_TABLET | Freq: Every day | ORAL | Status: DC
Start: 2020-06-25 — End: 2020-06-25
  Administered 2020-06-25: 20 mg via ORAL
  Filled 2020-06-24: qty 1

## 2020-06-24 MED ORDER — MAGNESIUM OXIDE 400 (241.3 MG) MG PO TABS
400.0000 mg | ORAL_TABLET | Freq: Every day | ORAL | Status: DC
  Administered 2020-06-24 – 2020-06-25 (×2): 400 mg via ORAL
  Filled 2020-06-24 (×2): qty 1

## 2020-06-24 MED ORDER — ALUM & MAG HYDROXIDE-SIMETH 200-200-20 MG/5ML PO SUSP
30.0000 mL | Freq: Once | ORAL | Status: AC
  Administered 2020-06-24: 30 mL via ORAL
  Filled 2020-06-24: qty 30

## 2020-06-24 MED ORDER — ROSUVASTATIN CALCIUM 5 MG PO TABS
5.0000 mg | ORAL_TABLET | Freq: Every day | ORAL | Status: DC
  Administered 2020-06-24: 5 mg via ORAL
  Filled 2020-06-24: qty 1

## 2020-06-24 MED ORDER — LABETALOL HCL 5 MG/ML IV SOLN: 10.0000 mg | INTRAVENOUS | Status: AC | PRN

## 2020-06-24 MED ORDER — SODIUM CHLORIDE 0.9% FLUSH: 3.0000 mL | Freq: Two times a day (BID) | INTRAVENOUS | Status: DC

## 2020-06-24 MED ORDER — ACETAMINOPHEN 325 MG PO TABS: 650.0000 mg | ORAL_TABLET | ORAL | Status: DC | PRN

## 2020-06-24 MED ORDER — ENOXAPARIN SODIUM 40 MG/0.4ML ~~LOC~~ SOLN: 40.0000 mg | SUBCUTANEOUS | Status: DC

## 2020-06-24 MED ORDER — LIDOCAINE HCL (PF) 1 % IJ SOLN
INTRAMUSCULAR | Status: AC
  Filled 2020-06-24: qty 30

## 2020-06-24 MED ORDER — HEPARIN SODIUM (PORCINE) 1000 UNIT/ML IJ SOLN
INTRAMUSCULAR | Status: DC | PRN
  Administered 2020-06-24: 4500 [IU] via INTRAVENOUS

## 2020-06-24 MED ORDER — NEBIVOLOL HCL 2.5 MG PO TABS
2.5000 mg | ORAL_TABLET | Freq: Every day | ORAL | Status: DC
  Administered 2020-06-25: 2.5 mg via ORAL
  Filled 2020-06-24 (×2): qty 1

## 2020-06-24 MED ORDER — ASPIRIN EC 81 MG PO TBEC: 81.0000 mg | DELAYED_RELEASE_TABLET | Freq: Every day | ORAL | Status: DC

## 2020-06-24 MED ORDER — SODIUM CHLORIDE 0.9 % WEIGHT BASED INFUSION
1.0000 mL/kg/h | INTRAVENOUS | Status: DC
  Administered 2020-06-24: 1 mL/kg/h via INTRAVENOUS

## 2020-06-24 MED ORDER — TAMSULOSIN HCL 0.4 MG PO CAPS
0.4000 mg | ORAL_CAPSULE | Freq: Every day | ORAL | Status: DC
  Administered 2020-06-24 – 2020-06-25 (×2): 0.4 mg via ORAL
  Filled 2020-06-24 (×2): qty 1

## 2020-06-24 MED ORDER — SODIUM CHLORIDE 0.9 % IV BOLUS (SEPSIS)
1000.0000 mL | Freq: Once | INTRAVENOUS | Status: AC
  Administered 2020-06-24: 1000 mL via INTRAVENOUS

## 2020-06-24 MED ORDER — NITROGLYCERIN 2 % TD OINT
0.5000 [in_us] | TOPICAL_OINTMENT | Freq: Once | TRANSDERMAL | Status: AC
  Administered 2020-06-24: 0.5 [in_us] via TOPICAL
  Filled 2020-06-24: qty 1

## 2020-06-24 MED ORDER — NITROGLYCERIN 0.4 MG SL SUBL: 0.4000 mg | SUBLINGUAL_TABLET | SUBLINGUAL | Status: DC | PRN

## 2020-06-24 MED ORDER — SODIUM CHLORIDE 0.9 % WEIGHT BASED INFUSION: 3.0000 mL/kg/h | INTRAVENOUS | Status: DC

## 2020-06-24 MED ORDER — LIDOCAINE HCL (PF) 1 % IJ SOLN
INTRAMUSCULAR | Status: DC | PRN
  Administered 2020-06-24: 2 mL

## 2020-06-24 MED ORDER — VERAPAMIL HCL 2.5 MG/ML IV SOLN
INTRAVENOUS | Status: AC
  Filled 2020-06-24: qty 2

## 2020-06-24 MED ORDER — SODIUM CHLORIDE 0.9 % IV SOLN: INTRAVENOUS | Status: DC

## 2020-06-24 MED ORDER — CLOPIDOGREL BISULFATE 75 MG PO TABS
75.0000 mg | ORAL_TABLET | Freq: Every day | ORAL | Status: DC
  Administered 2020-06-25: 75 mg via ORAL
  Filled 2020-06-24: qty 1

## 2020-06-24 MED ORDER — HEPARIN (PORCINE) IN NACL 1000-0.9 UT/500ML-% IV SOLN
INTRAVENOUS | Status: AC
  Filled 2020-06-24: qty 1000

## 2020-06-24 MED ORDER — SODIUM CHLORIDE 0.9% FLUSH: 3.0000 mL | INTRAVENOUS | Status: DC | PRN

## 2020-06-24 MED ORDER — SODIUM CHLORIDE 0.9 % IV SOLN
1000.0000 mL | INTRAVENOUS | Status: DC
  Administered 2020-06-24: 1000 mL via INTRAVENOUS

## 2020-06-24 MED ORDER — HEPARIN SODIUM (PORCINE) 1000 UNIT/ML IJ SOLN
INTRAMUSCULAR | Status: AC
  Filled 2020-06-24: qty 1

## 2020-06-24 MED ORDER — FENTANYL CITRATE (PF) 100 MCG/2ML IJ SOLN
INTRAMUSCULAR | Status: AC
  Filled 2020-06-24: qty 2

## 2020-06-24 MED ORDER — RANOLAZINE ER 500 MG PO TB12
500.0000 mg | ORAL_TABLET | Freq: Two times a day (BID) | ORAL | Status: DC
  Administered 2020-06-24: 500 mg via ORAL
  Filled 2020-06-24 (×2): qty 1

## 2020-06-24 MED ORDER — ASPIRIN 81 MG PO CHEW
81.0000 mg | CHEWABLE_TABLET | ORAL | Status: AC
  Administered 2020-06-24: 81 mg via ORAL
  Filled 2020-06-24: qty 1

## 2020-06-24 MED ORDER — DIAZEPAM 2 MG PO TABS: 5.0000 mg | ORAL_TABLET | Freq: Four times a day (QID) | ORAL | Status: DC | PRN

## 2020-06-24 MED ORDER — IOHEXOL 350 MG/ML SOLN
INTRAVENOUS | Status: DC | PRN
  Administered 2020-06-24: 110 mL

## 2020-06-24 MED ORDER — HEPARIN (PORCINE) IN NACL 1000-0.9 UT/500ML-% IV SOLN
INTRAVENOUS | Status: DC | PRN
  Administered 2020-06-24 (×3): 500 mL

## 2020-06-24 MED ORDER — LIDOCAINE VISCOUS HCL 2 % MT SOLN
15.0000 mL | Freq: Once | OROMUCOSAL | Status: AC
  Administered 2020-06-24: 15 mL via ORAL
  Filled 2020-06-24: qty 15

## 2020-06-24 MED ORDER — MIDAZOLAM HCL 2 MG/2ML IJ SOLN
INTRAMUSCULAR | Status: DC | PRN
  Administered 2020-06-24: 2 mg via INTRAVENOUS
  Administered 2020-06-24: 1 mg via INTRAVENOUS

## 2020-06-24 MED ORDER — SODIUM CHLORIDE 0.9 % IV SOLN: 250.0000 mL | INTRAVENOUS | Status: DC | PRN

## 2020-06-24 MED ORDER — EZETIMIBE 10 MG PO TABS
10.0000 mg | ORAL_TABLET | Freq: Every day | ORAL | Status: DC
  Administered 2020-06-25: 10 mg via ORAL
  Filled 2020-06-24 (×2): qty 1

## 2020-06-24 MED ORDER — OXYCODONE-ACETAMINOPHEN 5-325 MG PO TABS
1.0000 | ORAL_TABLET | Freq: Four times a day (QID) | ORAL | Status: DC | PRN
  Administered 2020-06-24 – 2020-06-25 (×3): 1 via ORAL
  Filled 2020-06-24 (×3): qty 1

## 2020-06-24 MED ORDER — VERAPAMIL HCL 2.5 MG/ML IV SOLN
INTRAVENOUS | Status: DC | PRN
  Administered 2020-06-24: 10 mL via INTRA_ARTERIAL

## 2020-06-24 MED ORDER — RANOLAZINE ER 500 MG PO TB12
1000.0000 mg | ORAL_TABLET | Freq: Two times a day (BID) | ORAL | Status: DC
  Administered 2020-06-24 – 2020-06-25 (×2): 1000 mg via ORAL
  Filled 2020-06-24 (×2): qty 2

## 2020-06-24 MED ORDER — FENTANYL CITRATE (PF) 100 MCG/2ML IJ SOLN
INTRAMUSCULAR | Status: DC | PRN
  Administered 2020-06-24: 50 ug via INTRAVENOUS
  Administered 2020-06-24: 25 ug via INTRAVENOUS

## 2020-06-24 MED ORDER — AMLODIPINE BESYLATE 2.5 MG PO TABS
2.5000 mg | ORAL_TABLET | Freq: Every day | ORAL | Status: DC
  Administered 2020-06-24 – 2020-06-25 (×2): 2.5 mg via ORAL
  Filled 2020-06-24 (×2): qty 1

## 2020-06-24 MED ORDER — ASPIRIN 81 MG PO CHEW
81.0000 mg | CHEWABLE_TABLET | Freq: Every day | ORAL | Status: DC
  Administered 2020-06-25: 81 mg via ORAL
  Filled 2020-06-24: qty 1

## 2020-06-24 SURGICAL SUPPLY — 14 items
BAG SNAP BAND KOVER 36X36 (MISCELLANEOUS) ×2 IMPLANT
CATH INFINITI 5 FR JL3.5 (CATHETERS) ×2 IMPLANT
CATH INFINITI 5FR JK (CATHETERS) ×2 IMPLANT
CATH INFINITI JR4 5F (CATHETERS) ×2 IMPLANT
CATH OPTITORQUE TIG 4.0 5F (CATHETERS) ×2 IMPLANT
COVER DOME SNAP 22 D (MISCELLANEOUS) ×2 IMPLANT
DEVICE RAD COMP TR BAND LRG (VASCULAR PRODUCTS) ×2 IMPLANT
GLIDESHEATH SLEND SS 6F .021 (SHEATH) ×2 IMPLANT
GUIDEWIRE INQWIRE 1.5J.035X260 (WIRE) ×1 IMPLANT
INQWIRE 1.5J .035X260CM (WIRE) ×2
KIT HEART LEFT (KITS) ×2 IMPLANT
PACK CARDIAC CATHETERIZATION (CUSTOM PROCEDURE TRAY) ×2 IMPLANT
TRANSDUCER W/STOPCOCK (MISCELLANEOUS) ×2 IMPLANT
TUBING CIL FLEX 10 FLL-RA (TUBING) ×2 IMPLANT

## 2020-06-24 NOTE — H&P (Addendum)
HISTORY AND PHYSICAL   Patient ID: Johnny [name] MRN: [id number]; DOB: 20-Apr-1951  Admit date: 06/24/2020 Date of Consult: 06/24/2020  Primary Care Provider: [name], MD [facility] Cardiologist: [name], MD  [facility] Electrophysiologist:  None    Patient Profile:   Johnny [name] is a 70 y.o. male with a hx of HTN, HLD, TiA, GERD and CAD with prior MI, remote smoker, cath 2017 managed medically until 06/2019 with overlapping stents to m/dLCX who is being seen today for the evaluation of chest pain at the request of Dr. Leonette Monarch.  History of Present Illness:   Johnny [name] with above hx and CAD disease treated medically ujuntil 06/2019 and rec'd stents overlapping to to m/dLCX and residual 35% PRCA, 80-90%1st diag, 90% 2nd diag and 65% dLAD, OM 1 80% and ostial OM2 50%.  EF 55-65%.   Echo 2019 with EF 60% G1DD, trivial MR and TR and normal RV.  Pt intolerant to nitrates and statins but has been able to tolerate crestor.   Pt now presents to ER at 0400 AM with chest pain that woke him from sleep around 0200.  He went to fire station, to be evaluated and was given ASA 324 mg and total of 3 SL NTG. On Arrival to ER still with 3/10 pain.  His pain was lt sided chest pain with radiation to lt arm, similar to prior episodes with stents.  Pain began as mild and increased to severe. NTG SL did help but pain continued.  No fevers, cough, abd pain no N or V.   Now after maalox and GI cocktail and NTG paste pain resolved.  He does note post stent he could do well with walking but then he slowed down and now with walking his pain returns a pinching pain.  But that has increased as well.    EKG:  The EKG was personally reviewed and demonstrates:  SR with RAD some mild ST depression inf leads but also with artifact. Will repeat. No acute changes Telemetry:  Telemetry was personally reviewed and demonstrates:  SR  Na 137, K+ 3.7 BUN 11, Cr 1.05 LFTs WNL  Hs troponin 3 and  4 WBC 10.2, Hgb 12.6, plts 188  COVID ordered  Labs 1 week ago with A1c of 5.3  LDL 103, HDL 44 TG 188 In addition to above meds he has rec'd GI cocktaily and NTG paste   2V CXR NAD only atelectasis at lung bases BP 120/72 and now 93/76       Past Medical History:  Diagnosis Date  . Adenomatous polyps   . Bronchitis   . CAD in native artery 2017  . Chronic back pain   . GERD (gastroesophageal reflux disease)   . Hyperlipidemia   . Hypertension   . Internal hemorrhoids   . Low testosterone   . Myocardial infarction ([place])   . Osteopenia   . Shortness of breath dyspnea   . Spondylosis   . TIA (transient ischemic attack) 2008  . Ureterolithiasis   . Ventral hernia   . Vitamin D deficiency          Past Surgical History:  Procedure Laterality Date  . BACK SURGERY     2 surgeries  . CARDIAC CATHETERIZATION N/A 09/12/2015   Procedure: Left Heart Cath and Coronary Angiography;  Surgeon: Lorretta Harp, MD;  Location: [place] [facility];  Service: Cardiovascular;  Laterality: N/A;  . CORONARY STENT INTERVENTION N/A 06/23/2019  Procedure: CORONARY STENT INTERVENTION;  Surgeon: Sherren Mocha, MD;  Location: [place] [facility];  Service: Cardiovascular;  Laterality: N/A;  . LEFT HEART CATH AND CORONARY ANGIOGRAPHY N/A 01/27/2018   Procedure: LEFT HEART CATH AND CORONARY ANGIOGRAPHY;  Surgeon: Jettie Booze, MD;  Location: [place] [facility];  Service: Cardiovascular;  Laterality: N/A;  . LEFT HEART CATH AND CORONARY ANGIOGRAPHY N/A 06/23/2019   Procedure: LEFT HEART CATH AND CORONARY ANGIOGRAPHY;  Surgeon: Sherren Mocha, MD;  Location: [place] [facility];  Service: Cardiovascular;  Laterality: N/A;     Home Medications:         Prior to Admission medications   Medication Sig Start Date End Date Taking? Authorizing Provider  aspirin EC 81 MG EC tablet Take 1 tablet (81 mg total) by mouth daily. 06/24/19  Yes Reino Bellis B, NP  clopidogrel  (PLAVIX) 75 MG tablet Take 1 tablet (75 mg total) by mouth daily. 03/23/20  Yes [name], MD  Cyanocobalamin (VITAMIN B-12) 1000 MCG SUBL Place 1 each under the tongue every morning.   Yes [provider]  Magnesium 400 MG TABS Take 1 tablet by mouth daily.   Yes [provider]  nebivolol (BYSTOLIC) 2.5 MG tablet Take 1 tablet (2.5 mg total) by mouth daily. 03/02/20  Yes [name], MD  nitroGLYCERIN (NITROSTAT) 0.4 MG SL tablet Place 0.4 mg under the tongue every 5 (five) minutes as needed for chest pain.   Yes [provider]  oxyCODONE-acetaminophen (PERCOCET) 10-325 MG tablet Take 1 tablet by mouth every 6 (six) hours as needed for pain.  05/25/19  Yes [provider]  ranolazine (RANEXA) 500 MG 12 hr tablet TAKE 1 TABLET BY MOUTH TWICE A DAY Patient taking differently: Take 500 mg by mouth 2 (two) times daily. 12/21/19  Yes Burtis Junes, NP  rosuvastatin (CRESTOR) 5 MG tablet TAKE 1 TABLET BY MOUTH EVERY DAY Patient taking differently: Take 5 mg by mouth daily. 05/20/20  Yes Baxley, Cresenciano Lick, MD  tamsulosin (FLOMAX) 0.4 MG CAPS capsule Take 1 capsule (0.4 mg total) by mouth daily. 06/17/20  Yes [name], MD    Inpatient Medications: Scheduled Meds: Continuous Infusions: . sodium chloride 1,000 mL (06/24/20 0752)   PRN Meds:   Allergies:         Allergies  Allergen Reactions  . Atorvastatin Other (See Comments)    Joint pain  . Metadate Cd [Methylphenidate Hcl] Other (See Comments)    Burning, GI upset, bloating  . Other Other (See Comments)    Burning, GI upset, bloating  . Praluent [Alirocumab]     Muscle pain and flu like symptoms  . Pravastatin Other (See Comments)  . Hydrocodone Nausea Only    Can take with food  . Levofloxacin Nausea And Vomiting  . Levofloxacin In D5w Nausea And Vomiting  . Morphine And Related Nausea And Vomiting    Social History:   Social History         Socioeconomic History  . Marital status: Married    Spouse name: Not on file  . Number of children: 4  . Years of education: 72  . Highest education level: Not on file  Occupational History  . Occupation: Geophysical data processor  Tobacco Use  . Smoking status: Former Smoker    Years: 10.00    Types: Cigarettes    Quit date: 01/04/1978    Years since quitting: 42.4  . Smokeless tobacco: Never Used  Vaping Use  .  Vaping Use: Never used  Substance and Sexual Activity  . Alcohol use: No    Alcohol/week: 0.0 standard drinks    Comment: rarely  . Drug use: No  . Sexual activity: Not on file  Other Topics Concern  . Not on file  Social History Narrative   Lives at home with his wife.   Right-handed.   2 cups caffeine per day.   Social Determinants of Health   Financial Resource Strain: Not on file  Food Insecurity: Not on file  Transportation Needs: Not on file  Physical Activity: Not on file  Stress: Not on file  Social Connections: Not on file  Intimate Partner Violence: Not on file    Family History:         Family History  Problem Relation Age of Onset  . Heart disease Mother   . Stroke Mother   . Heart attack Mother   . Emphysema Father      ROS:  Please see the history of present illness.  General:no colds or fevers, no weight changes Skin:no rashes or ulcers HEENT:no blurred vision, no congestion CV:see HPI PUL:see HPI GI:no diarrhea constipation or melena, no indigestion GU:no hematuria, no dysuria MS:no joint pain, no claudication Neuro:no syncope, no lightheadedness Endo:no diabetes, no thyroid disease  All other ROS reviewed and negative.     Physical Exam/Data:         Vitals:   06/24/20 0445 06/24/20 0530 06/24/20 0545 06/24/20 0615  BP: 111/74 129/87 108/76 118/76  Pulse: (!) 51 (!) 53 (!) 55 (!) 57  Resp: 17 14 13 16   SpO2: 97% 98% 98% 98%  Weight:      Height:       No intake or output data in the 24  hours ending 06/24/20 0909 Last 3 Weights 06/24/2020 06/17/2020 04/23/2020  Weight (lbs) 201 lb 15.1 oz 202 lb 206 lb  Weight (kg) 91.6 kg 91.627 kg 93.441 kg     Body mass index is 27.39 kg/m.  General:  Well nourished, well developed, in no acute distress feeling better HEENT: normal Lymph: no adenopathy Neck: no JVD Endocrine:  No thryomegaly Vascular: No carotid bruits; pedal pulses 2+ bilaterally  Cardiac:  normal S1, S2; RRR; no murmur gallup rub or click Lungs:  clear to auscultation bilaterally, no wheezing, rhonchi or rales  Abd: soft, nontender, no hepatomegaly  Ext: no edema Musculoskeletal:  No deformities, BUE and BLE strength normal and equal Skin: warm and dry  Neuro:  Alert and oriented X 3 MAE follows commands, no focal abnormalities noted Psych:  Normal affect    Relevant CV Studies: CORONARY STENT INTERVENTION2/06/2019  LEFT HEART CATH AND CORONARY ANGIOGRAPHY  Conclusion  1. Nonobstructive LAD stenosis with moderately tight distal vessel stenosis and severe diagonal branch stenosis, all unchanged from the previous study. 2. Nonobstructive RCA stenosis, large dominant vessel with mild nonobstructive plaque in the PDA branch 3. Severe mid to distal left circumflex stenosis, new lesion from previous study suspected plaque rupture, treated successfully with overlapping drug-eluting stents as outlined (3.0 x 12 mm resolute Onyx and 3.0 x 15 mm resolute Onyx placed more proximally in overlapping fashion) 4. Normal LV systolic function  Recommendations: We will change the patient from clopidogrel to take ticagrelor for more potent antiplatelet therapy. Aggressive risk reduction measures and medical therapy.   Diagnostic Dominance: Right    Intervention      EchoStudy Conclusions9/2019  - Left ventricle: The cavity size was normal. There was moderate  focal basal hypertrophy of the septum. Systolic function was normal. The estimated  ejection fraction was in the range of 60% to 65%. Wall motion was normal; there were no regional wall motion abnormalities. Doppler parameters are consistent with abnormal left ventricular relaxation (grade 1 diastolic dysfunction). Doppler parameters are consistent with indeterminate ventricular filling pressure. - Aortic valve: Transvalvular velocity was within the normal range. There was no stenosis. There was no regurgitation. Valve area (VTI): 3.34 cm^2. Valve area (Vmax): 3.29 cm^2. Valve area (Vmean): 3.33 cm^2. - Mitral valve: Transvalvular velocity was within the normal range. There was no evidence for stenosis. There was trivial regurgitation. - Right ventricle: The cavity size was normal. Wall thickness was normal. Systolic function was normal. - Tricuspid valve: There was trivial regurgitation. - Pulmonary arteries: Systolic pressure was within the normal range. PA peak pressure: 29 mm Hg (S).      Laboratory Data:  High Sensitivity Troponin:   Last Labs       Recent Labs  Lab 06/24/20 0412 06/24/20 0612  TROPONINIHS 3 4       Chemistry Last Labs      Recent Labs  Lab 06/24/20 0412  NA 137  K 3.7  CL 106  CO2 21*  GLUCOSE 92  BUN 11  CREATININE 1.05  CALCIUM 9.4  GFRNONAA >60  ANIONGAP 10      Last Labs      Recent Labs  Lab 06/24/20 0412  PROT 6.5  ALBUMIN 3.4*  AST 20  ALT 15  ALKPHOS 45  BILITOT 0.8     Hematology Last Labs      Recent Labs  Lab 06/24/20 0412  WBC 10.2  RBC 3.84*  HGB 12.6*  HCT 38.4*  MCV 100.0  MCH 32.8  MCHC 32.8  RDW 12.0  PLT 188     BNP Last Labs   No results for input(s): BNP, PROBNP in the last 168 hours.    DDimer  Last Labs   No results for input(s): DDIMER in the last 168 hours.     Radiology/Studies:  DG Chest 2 View  Result Date: 06/24/2020 CLINICAL DATA:  Left-sided chest pain radiating to the arm EXAM: CHEST - 2 VIEW COMPARISON:  04/23/2020  FINDINGS: Lower lung volumes than before with mild streaky density at the bases. No edema, effusion, or pneumothorax. Normal heart size and stable mediastinal contours IMPRESSION: Atelectasis at the lung bases. Electronically Signed   By: Monte Fantasia M.D.   On: 06/24/2020 05:36     Assessment and Plan:   1. Unstable angina, concerning for increased CAD, neg troponin but troponin neg last year when he rec'd 2 stents.  Will recheck EKG. - he may need repeat cath for complete eval.  Continue ASA and plavix for now.  Continue ranexa and bystolic for BB will admit to tele 2. CAD with overlapping DES stents last year to LCX and residual disease -hx of intolerance to Nitrates 3. HLD intolerant to statins but has been able to take crestor 5 mg daly. Last LDL 103, goal <70 will add zetia. 4. HTN controlled.  5. Chronic back pain on pain meds   Risk Assessment/Risk Scores:     HEAR Score (for undifferentiated chest pain):  HEAR Score: 7          For questions or updates, please contact [place] Please consult [email or web address] for contact info under    Signed, Cecilie Kicks, NP  06/24/2020 9:09 AM As above, patient seen and  examined.  Briefly he is a 70 year old male with past medical history of hypertension, hyperlipidemia, coronary artery disease, COPD with unstable angina.  Patient has had previous PCI of his circumflex.  Over the past 2 to 3 weeks he has noticed a sharp stabbing pain in his chest when going up stairs or taking walks relieved with rest.  This morning he awoke with chest pressure similar to his previous cardiac pain.  It radiated to his left upper extremity.  No associated symptoms.  He took nitroglycerin with relief.  He is presently pain-free.  Electrocardiogram shows sinus rhythm with no ST changes.  Troponins are normal. 1 unstable angina-patient describes progressive chest pain and then symptoms at rest.  Presently pain-free.  Plan cardiac catheterization.   The risk and benefits including myocardial infarction, CVA and death discussed and he agrees to proceed.  We will continue aspirin, Plavix, beta-blocker and statin.  2 hyperlipidemia-he has not tolerated high-dose statins previously.  We will continue low-dose Crestor.  Most recent LDL greater than 100.  Add Zetia 10 mg daily.  Check lipids and liver in 12 weeks.  3 hypertension-blood pressure is controlled.  Continue present medications and follow.  Kirk Ruths, MD        Revision History                        Note Details  Author Isaiah Serge, NP File Time 06/24/2020 9:40 AM  Author Type Nurse Practitioner Status Cosign Needed Addendum  Last Editor Isaiah Serge, [place] # [id number] Admit Date 06/24/2020

## 2020-06-24 NOTE — Progress Notes (Signed)
  Echocardiogram 2D Echocardiogram has been performed.  Michiel Cowboy 06/24/2020, 10:48 AM

## 2020-06-24 NOTE — Interval H&P Note (Signed)
Cath Lab Visit (complete for each Cath Lab visit)  Clinical Evaluation Leading to the Procedure:   ACS: No.  Non-ACS:    Anginal Classification: CCS III  Anti-ischemic medical therapy: Maximal Therapy (2 or more classes of medications)  Non-Invasive Test Results: No non-invasive testing performed  Prior CABG: No previous CABG      History and Physical Interval Note:  06/24/2020 1:25 PM  Johnny [name]  has presented today for surgery, with the diagnosis of unstable angina.  The various methods of treatment have been discussed with the patient and family. After consideration of risks, benefits and other options for treatment, the patient has consented to  Procedure(s): LEFT HEART CATH AND CORONARY ANGIOGRAPHY (N/A) as a surgical intervention.  The patient's history has been reviewed, patient examined, no change in status, stable for surgery.  I have reviewed the patient's chart and labs.  Questions were answered to the patient's satisfaction.     Shelva Majestic

## 2020-06-24 NOTE — ED Notes (Signed)
Echo in progress at bedside.

## 2020-06-24 NOTE — ED Triage Notes (Signed)
Pt brought to ED via EMS from fire station with c/o left sided chest pain radiating to left arm. Hx MI, 2x cardiac stents placed last year. Pt states he woke up at 2AM due to pain and wife drove him to fire station to be checked out. Pt took 3 x SL nitro, given 324mg  asp by fire. Pt currently A&O x 4 on arrival to ED, VSS, NAD. Sinus rhythm on monitor, pain currently rated 3/10.  EMS v/s: 131/76 62 HR 97% on room air

## 2020-06-24 NOTE — Consult Note (Addendum)
Cardiology Consultation:   Patient ID: JAXSIN BOTTOMLEY MRN: [id number]; DOB: 10/04/1950  Admit date: 06/24/2020 Date of Consult: 06/24/2020  Primary Care Provider: [name], MD [facility] Cardiologist: [name], MD  [facility] Electrophysiologist:  None    Patient Profile:   ANUJ SUMMONS is a 70 y.o. male with a hx of HTN, HLD, TiA, GERD and CAD with prior MI, remote smoker, cath 2017 managed medically until 06/2019 with overlapping stents to m/dLCX who is being seen today for the evaluation of chest pain at the request of Dr. Leonette Monarch.  History of Present Illness:   Mr. Marland with above hx and CAD disease treated medically ujuntil 06/2019 and rec'd stents overlapping to to m/dLCX and residual 35% PRCA, 80-90%1st diag, 90% 2nd diag and 65% dLAD, OM 1 80% and ostial OM2 50%.  EF 55-65%.   Echo 2019 with EF 60% G1DD, trivial MR and TR and normal RV.  Pt intolerant to nitrates and statins but has been able to tolerate crestor.   Pt now presents to ER at 0400 AM with chest pain that woke him from sleep around 0200.  [place] fire [place], to be evaluated and was given ASA 324 mg and total of 3 SL NTG. On Arrival to ER still with 3/10 pain.  His pain was lt sided chest pain with radiation to lt arm, similar to prior episodes with stents.  Pain began as mild and increased to severe. NTG SL did help but pain continued.  No fevers, cough, abd pain no N or V.   Now after maalox and GI cocktail and NTG paste pain resolved.  He does note post stent he could do well with walking but then he slowed down and now with walking his pain returns a pinching pain.  But that has increased as well.    EKG:  The EKG was personally reviewed and demonstrates:  SR with RAD some mild ST depression inf leads but also with artifact. Will repeat. No acute changes Telemetry:  Telemetry was personally reviewed and demonstrates:  SR  Na 137, K+ 3.7 BUN 11, Cr 1.05 LFTs WNL  Hs troponin 3 and 4 WBC  10.2, Hgb 12.6, plts 188  COVID ordered  Labs 1 week ago with A1c of 5.3  LDL 103, HDL 44 TG 188 In addition to above meds he has rec'd GI cocktaily and NTG paste   2V CXR NAD only atelectasis at lung bases BP 120/72 and now 93/76   Past Medical History:  Diagnosis Date  . Adenomatous polyps   . Bronchitis   . CAD in native artery 2017  . Chronic back pain   . GERD (gastroesophageal reflux disease)   . Hyperlipidemia   . Hypertension   . Internal hemorrhoids   . Low testosterone   . Myocardial infarction ([place])   . Osteopenia   . Shortness of breath dyspnea   . Spondylosis   . TIA (transient ischemic attack) 2008  . Ureterolithiasis   . Ventral hernia   . Vitamin D deficiency     Past Surgical History:  Procedure Laterality Date  . BACK SURGERY     2 surgeries  . CARDIAC CATHETERIZATION N/A 09/12/2015   Procedure: Left Heart Cath and Coronary Angiography;  Surgeon: Lorretta Harp, MD;  Location: [place] [facility];  Service: Cardiovascular;  Laterality: N/A;  . CORONARY STENT INTERVENTION N/A 06/23/2019   Procedure: CORONARY STENT INTERVENTION;  Surgeon: Sherren Mocha, MD;  Location:  [place] [facility];  Service: Cardiovascular;  Laterality: N/A;  . LEFT HEART CATH AND CORONARY ANGIOGRAPHY N/A 01/27/2018   Procedure: LEFT HEART CATH AND CORONARY ANGIOGRAPHY;  Surgeon: Jettie Booze, MD;  Location: [place] [facility];  Service: Cardiovascular;  Laterality: N/A;  . LEFT HEART CATH AND CORONARY ANGIOGRAPHY N/A 06/23/2019   Procedure: LEFT HEART CATH AND CORONARY ANGIOGRAPHY;  Surgeon: Sherren Mocha, MD;  Location: [place] [facility];  Service: Cardiovascular;  Laterality: N/A;     Home Medications:  Prior to Admission medications   Medication Sig Start Date End Date Taking? Authorizing Provider  aspirin EC 81 MG EC tablet Take 1 tablet (81 mg total) by mouth daily. 06/24/19  Yes Reino Bellis B, NP  clopidogrel (PLAVIX) 75 MG tablet Take 1 tablet (75 mg total) by  mouth daily. 03/23/20  Yes [name], MD  Cyanocobalamin (VITAMIN B-12) 1000 MCG SUBL Place 1 each under the tongue every morning.   Yes [provider]  Magnesium 400 MG TABS Take 1 tablet by mouth daily.   Yes [provider]  nebivolol (BYSTOLIC) 2.5 MG tablet Take 1 tablet (2.5 mg total) by mouth daily. 03/02/20  Yes [name], MD  nitroGLYCERIN (NITROSTAT) 0.4 MG SL tablet Place 0.4 mg under the tongue every 5 (five) minutes as needed for chest pain.   Yes [provider]  oxyCODONE-acetaminophen (PERCOCET) 10-325 MG tablet Take 1 tablet by mouth every 6 (six) hours as needed for pain.  05/25/19  Yes [provider]  ranolazine (RANEXA) 500 MG 12 hr tablet TAKE 1 TABLET BY MOUTH TWICE A DAY Patient taking differently: Take 500 mg by mouth 2 (two) times daily. 12/21/19  Yes Burtis Junes, NP  rosuvastatin (CRESTOR) 5 MG tablet TAKE 1 TABLET BY MOUTH EVERY DAY Patient taking differently: Take 5 mg by mouth daily. 05/20/20  Yes Baxley, Cresenciano Lick, MD  tamsulosin (FLOMAX) 0.4 MG CAPS capsule Take 1 capsule (0.4 mg total) by mouth daily. 06/17/20  Yes [name], MD    Inpatient Medications: Scheduled Meds:  Continuous Infusions: . sodium chloride 1,000 mL (06/24/20 0752)   PRN Meds:   Allergies:    Allergies  Allergen Reactions  . Atorvastatin Other (See Comments)    Joint pain  . Metadate Cd [Methylphenidate Hcl] Other (See Comments)    Burning, GI upset, bloating  . Other Other (See Comments)    Burning, GI upset, bloating  . Praluent [Alirocumab]     Muscle pain and flu like symptoms  . Pravastatin Other (See Comments)  . Hydrocodone Nausea Only    Can take with food  . Levofloxacin Nausea And Vomiting  . Levofloxacin In D5w Nausea And Vomiting  . Morphine And Related Nausea And Vomiting    Social History:   Social History   Socioeconomic History  . Marital status: Married    Spouse name: Not on file  . Number of  children: 4  . Years of education: 67  . Highest education level: Not on file  Occupational History  . Occupation: Geophysical data processor  Tobacco Use  . Smoking status: Former Smoker    Years: 10.00    Types: Cigarettes    Quit date: 01/04/1978    Years since quitting: 42.4  . Smokeless tobacco: Never Used  Vaping Use  . Vaping Use: Never used  Substance and Sexual Activity  . Alcohol use: No    Alcohol/week: 0.0 standard drinks    Comment:  rarely  . Drug use: No  . Sexual activity: Not on file  Other Topics Concern  . Not on file  Social History Narrative   Lives at home with his wife.   Right-handed.   2 cups caffeine per day.   Social Determinants of Health   Financial Resource Strain: Not on file  Food Insecurity: Not on file  Transportation Needs: Not on file  Physical Activity: Not on file  Stress: Not on file  Social Connections: Not on file  Intimate Partner Violence: Not on file    Family History:    Family History  Problem Relation Age of Onset  . Heart disease Mother   . Stroke Mother   . Heart attack Mother   . Emphysema Father      ROS:  Please see the history of present illness.  General:no colds or fevers, no weight changes Skin:no rashes or ulcers HEENT:no blurred vision, no congestion CV:see HPI PUL:see HPI GI:no diarrhea constipation or melena, no indigestion GU:no hematuria, no dysuria MS:no joint pain, no claudication Neuro:no syncope, no lightheadedness Endo:no diabetes, no thyroid disease  All other ROS reviewed and negative.     Physical Exam/Data:   Vitals:   06/24/20 0445 06/24/20 0530 06/24/20 0545 06/24/20 0615  BP: 111/74 129/87 108/76 118/76  Pulse: (!) 51 (!) 53 (!) 55 (!) 57  Resp: 17 14 13 16   SpO2: 97% 98% 98% 98%  Weight:      Height:       No intake or output data in the 24 hours ending 06/24/20 0909 Last 3 Weights 06/24/2020 06/17/2020 04/23/2020  Weight (lbs) 201 lb 15.1 oz 202 lb 206 lb  Weight (kg) 91.6 kg 91.627  kg 93.441 kg     Body mass index is 27.39 kg/m.  General:  Well nourished, well developed, in no acute distress feeling better HEENT: normal Lymph: no adenopathy Neck: no JVD Endocrine:  No thryomegaly Vascular: No carotid bruits; pedal pulses 2+ bilaterally  Cardiac:  normal S1, S2; RRR; no murmur gallup rub or click Lungs:  clear to auscultation bilaterally, no wheezing, rhonchi or rales  Abd: soft, nontender, no hepatomegaly  Ext: no edema Musculoskeletal:  No deformities, BUE and BLE strength normal and equal Skin: warm and dry  Neuro:  Alert and oriented X 3 MAE follows commands, no focal abnormalities noted Psych:  Normal affect    Relevant CV Studies: CORONARY STENT INTERVENTION2/06/2019  LEFT HEART CATH AND CORONARY ANGIOGRAPHY  Conclusion  1. Nonobstructive LAD stenosis with moderately tight distal vessel stenosis and severe diagonal branch stenosis, all unchanged from the previous study. 2. Nonobstructive RCA stenosis, large dominant vessel with mild nonobstructive plaque in the PDA branch 3. Severe mid to distal left circumflex stenosis, new lesion from previous study suspected plaque rupture, treated successfully with overlapping drug-eluting stents as outlined (3.0 x 12 mm resolute Onyx and 3.0 x 15 mm resolute Onyx placed more proximally in overlapping fashion) 4. Normal LV systolic function  Recommendations: We will change the patient from clopidogrel to take ticagrelor for more potent antiplatelet therapy. Aggressive risk reduction measures and medical therapy.   Diagnostic Dominance: Right    Intervention      EchoStudy Conclusions9/2019  - Left ventricle: The cavity size was normal. There was moderate focal basal hypertrophy of the septum. Systolic function was normal. The estimated ejection fraction was in the range of 60% to 65%. Wall motion was normal; there were no regional wall motion abnormalities. Doppler parameters are  consistent with abnormal left ventricular relaxation (grade 1 diastolic dysfunction). Doppler parameters are consistent with indeterminate ventricular filling pressure. - Aortic valve: Transvalvular velocity was within the normal range. There was no stenosis. There was no regurgitation. Valve area (VTI): 3.34 cm^2. Valve area (Vmax): 3.29 cm^2. Valve area (Vmean): 3.33 cm^2. - Mitral valve: Transvalvular velocity was within the normal range. There was no evidence for stenosis. There was trivial regurgitation. - Right ventricle: The cavity size was normal. Wall thickness was normal. Systolic function was normal. - Tricuspid valve: There was trivial regurgitation. - Pulmonary arteries: Systolic pressure was within the normal range. PA peak pressure: 29 mm Hg (S).      Laboratory Data:  High Sensitivity Troponin:   Recent Labs  Lab 06/24/20 0412 06/24/20 0612  TROPONINIHS 3 4     Chemistry Recent Labs  Lab 06/24/20 0412  NA 137  K 3.7  CL 106  CO2 21*  GLUCOSE 92  BUN 11  CREATININE 1.05  CALCIUM 9.4  GFRNONAA >60  ANIONGAP 10    Recent Labs  Lab 06/24/20 0412  PROT 6.5  ALBUMIN 3.4*  AST 20  ALT 15  ALKPHOS 45  BILITOT 0.8   Hematology Recent Labs  Lab 06/24/20 0412  WBC 10.2  RBC 3.84*  HGB 12.6*  HCT 38.4*  MCV 100.0  MCH 32.8  MCHC 32.8  RDW 12.0  PLT 188   BNPNo results for input(s): BNP, PROBNP in the last 168 hours.  DDimer No results for input(s): DDIMER in the last 168 hours.   Radiology/Studies:  DG Chest 2 View  Result Date: 06/24/2020 CLINICAL DATA:  Left-sided chest pain radiating to the arm EXAM: CHEST - 2 VIEW COMPARISON:  04/23/2020 FINDINGS: Lower lung volumes than before with mild streaky density at the bases. No edema, effusion, or pneumothorax. Normal heart size and stable mediastinal contours IMPRESSION: Atelectasis at the lung bases. Electronically Signed   By: Monte Fantasia M.D.   On: 06/24/2020  05:36     Assessment and Plan:   1. Unstable angina, concerning for increased CAD, neg troponin but troponin neg last year when he rec'd 2 stents.  Will recheck EKG. - he may need repeat cath for complete eval.  Continue ASA and plavix for now.  Continue ranexa and bystolic for BB 2. CAD with overlapping DES stents last year to LCX and residual disease -hx of intolerance to Nitrates 3. HLD intolerant to statins but has been able to take crestor 5 mg daly. Last LDL 103, goal <70 will add zetia. 4. HTN controlled.  5. Chronic back pain on pain meds   Risk Assessment/Risk Scores:     HEAR Score (for undifferentiated chest pain):  HEAR Score: 7   For questions or updates, please contact [employer] Please consult [email or web address] for contact info under   Signed, Cecilie Kicks, NP  06/24/2020 9:09 AM As above, patient seen and examined.  Briefly he is a 70 year old male with past medical history of hypertension, hyperlipidemia, coronary artery disease, COPD with unstable angina.  Patient has had previous PCI of his circumflex.  Over the past 2 to 3 weeks he has noticed a sharp stabbing pain in his chest when going up stairs or taking walks relieved with rest.  This morning he awoke with chest pressure similar to his previous cardiac pain.  It radiated to his left upper extremity.  No associated symptoms.  He took nitroglycerin with relief.  He is presently pain-free.  Electrocardiogram shows sinus  rhythm with no ST changes.  Troponins are normal. 1 unstable angina-patient describes progressive chest pain and then symptoms at rest.  Presently pain-free.  Plan cardiac catheterization.  The risk and benefits including myocardial infarction, CVA and death discussed and he agrees to proceed.  We will continue aspirin, Plavix, beta-blocker and statin.  2 hyperlipidemia-he has not tolerated high-dose statins previously.  We will continue low-dose Crestor.  Most recent LDL greater than 100.  Add Zetia 10  mg daily.  Check lipids and liver in 12 weeks.  3 hypertension-blood pressure is controlled.  Continue present medications and follow.  Kirk Ruths, MD

## 2020-06-24 NOTE — ED Provider Notes (Signed)
[facility] EMERGENCY DEPARTMENT Provider Note  CSN: [id number] Arrival date & time: 06/24/20 0350  Chief Complaint(s) Chest Pain  HPI Johnny [name] is a 70 y.o. male with a past medical history listed below including prior MI status post stenting, CAD with a left circumflex stent placed in 2021 here for substernal chest pain that began 2 hours prior to arrival. Pain awakened patient from sleep. Described as pressure to the left side of the chest radiating to the left arm.  Reports that this is similar to his prior MI just less intense. Pain slowly increased from mild to severe. Nonexertional. Associated with mild shortness of breath. Improved with nitroglycerin. Received 325 of aspirin by fire department. No recent fevers or infections. No coughing or congestion. No abdominal pain. No nausea or vomiting.  Currently pain is 2 out of 10.  HPI  Past Medical History Past Medical History:  Diagnosis Date  . Adenomatous polyps   . Bronchitis   . CAD in native artery 2017  . Chronic back pain   . GERD (gastroesophageal reflux disease)   . Hyperlipidemia   . Hypertension   . Internal hemorrhoids   . Low testosterone   . Myocardial infarction ([place])   . Osteopenia   . Shortness of breath dyspnea   . Spondylosis   . TIA (transient ischemic attack) 2008  . Ureterolithiasis   . Ventral hernia   . Vitamin D deficiency    Patient Active Problem List   Diagnosis Date Noted  . Atelectasis of both lungs 01/26/2018  . 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 Unstable angina ([place])   . TIA (transient ischemic attack) 01/25/2018  . Exertional dyspnea 09/23/2015  . CAP (community acquired pneumonia) 09/23/2015  . Lung nodule 09/23/2015  . Cough 09/23/2015  . Pneumonitis   . Coronary artery disease involving native coronary artery of native heart with unstable angina pectoris ([place])   . NSTEMI (non-ST elevated myocardial infarction) ([place])   . Chest pain 09/10/2015  . 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 Osteopenia 01/20/2012  . Hx of adenomatous colonic polyps 01/20/2012  . GE reflux 01/20/2012  . Vitamin D deficiency 01/20/2012  . Low serum testosterone level 01/20/2012  . Erectile dysfunction 01/20/2012  . Impaired glucose tolerance 01/20/2012  . Chronic back pain 01/20/2012  . Depression 02/01/2011  . ATTENTION OR CONCENTRATION DEFICIT 01/11/2010  . HYPERCHOLESTEROLEMIA 11/30/2008  . Essential hypertension 11/30/2008  . DEGENERATIVE JOINT DISEASE 11/30/2008   Home Medication(s) Prior to Admission medications   Medication Sig Start Date End Date Taking? Authorizing Provider  aspirin EC 81 MG EC tablet Take 1 tablet (81 mg total) by mouth daily. 06/24/19  Yes Reino Bellis B, NP  clopidogrel (PLAVIX) 75 MG tablet Take 1 tablet (75 mg total) by mouth daily. 03/23/20  Yes [name], MD  Cyanocobalamin (VITAMIN B-12) 1000 MCG SUBL Place 1 each under the tongue every morning.   Yes [provider]  Magnesium 400 MG TABS Take 1 tablet by mouth daily.   Yes [provider]  nebivolol (BYSTOLIC) 2.5 MG tablet Take 1 tablet (2.5 mg total) by mouth daily. 03/02/20  Yes [name], MD  nitroGLYCERIN (NITROSTAT) 0.4 MG SL tablet Place 0.4 mg under the tongue every 5 (five) minutes as needed for chest pain.   Yes [provider]  oxyCODONE-acetaminophen (PERCOCET) 10-325 MG tablet Take 1 tablet by mouth every 6 (six) hours as needed for pain.  05/25/19  Yes [provider]  ranolazine (RANEXA) 500 MG 12 hr tablet TAKE 1 TABLET BY MOUTH TWICE A DAY Patient taking differently: Take 500 mg by mouth 2 (two) times daily. 12/21/19  Yes Rosalio Macadamia, NP  rosuvastatin (CRESTOR) 5 MG tablet TAKE 1 TABLET BY MOUTH EVERY DAY Patient taking differently: Take 5 mg by mouth daily. 05/20/20  Yes Baxley, Luanna Cole, MD  tamsulosin (FLOMAX) 0.4 MG CAPS capsule Take 1 capsule (0.4 mg total) by mouth daily. 06/17/20  Yes Baxley, Luanna Cole, MD                                                                                                                                     Past Surgical History Past Surgical History:  Procedure Laterality Date  . BACK SURGERY     2 surgeries  . CARDIAC CATHETERIZATION N/A 09/12/2015   Procedure: Left Heart Cath and Coronary Angiography;  Surgeon: Runell Gess, MD;  Location: [facility] INVASIVE CV LAB;  Service: Cardiovascular;  Laterality: N/A;  . CORONARY STENT INTERVENTION N/A 06/23/2019   Procedure: CORONARY STENT INTERVENTION;  Surgeon: Tonny Bollman, MD;  Location: [facility] INVASIVE CV LAB;  Service: Cardiovascular;  Laterality: N/A;  . LEFT HEART CATH AND CORONARY ANGIOGRAPHY N/A 01/27/2018   Procedure: LEFT HEART CATH AND CORONARY ANGIOGRAPHY;  Surgeon: Corky Crafts, MD;  Location: [facility] INVASIVE CV LAB;  Service: Cardiovascular;  Laterality: N/A;  . LEFT HEART CATH AND CORONARY ANGIOGRAPHY N/A 06/23/2019   Procedure: LEFT HEART CATH AND CORONARY ANGIOGRAPHY;  Surgeon: Tonny Bollman, MD;  Location: [facility] INVASIVE CV LAB;  Service: Cardiovascular;  Laterality: N/A;   Family History Family History  Problem Relation Age of Onset  . Heart disease Mother   . Stroke Mother   . Heart attack Mother   . Emphysema Father     Social History Social History   Tobacco Use  . Smoking status: Former Smoker    Years: 10.00    Types: Cigarettes    Quit date: 01/04/1978    Years since quitting: 42.4  . Smokeless tobacco: Never Used  Vaping Use  . Vaping Use: Never used  Substance Use Topics  . Alcohol use: No    Alcohol/week: 0.0 standard drinks    Comment: rarely  . Drug use: No   Allergies Atorvastatin, Metadate cd [methylphenidate hcl], Other, Praluent [alirocumab], Pravastatin, Hydrocodone, Levofloxacin, Levofloxacin in d5w, and Morphine and related  Review of Systems Review of Systems All other systems are reviewed and are negative for acute change except as noted in the HPI  Physical Exam Vital Signs   I have reviewed the triage vital signs BP 120/72   Pulse (!) 54   Resp 17   Ht 6' (1.829 m)   Wt 91.6 kg   SpO2 100%   BMI 27.39 kg/m   Physical Exam Vitals reviewed.  Constitutional:      General: He is not in acute distress.  Appearance: He is well-developed and well-nourished. He is not diaphoretic.  HENT:     Head: Normocephalic and atraumatic.     Nose: Nose normal.  Eyes:     General: No scleral icterus.       Right eye: No discharge.        Left eye: No discharge.     Extraocular Movements: EOM normal.     Conjunctiva/sclera: Conjunctivae normal.     Pupils: Pupils are equal, round, and reactive to light.  Cardiovascular:     Rate and Rhythm: Normal rate and regular rhythm.     Heart sounds: No murmur heard. No friction rub. No gallop.   Pulmonary:     Effort: Pulmonary effort is normal. No respiratory distress.     Breath sounds: Normal breath sounds. No stridor. No rales.  Abdominal:     General: There is no distension.     Palpations: Abdomen is soft.     Tenderness: There is no abdominal tenderness.  Musculoskeletal:        General: No tenderness or edema.     Cervical back: Normal range of motion and neck supple.  Skin:    General: Skin is warm and dry.     Findings: No erythema or rash.  Neurological:     Mental Status: He is alert and oriented to person, place, and time.  Psychiatric:        Mood and Affect: Mood and affect normal.     ED Results and Treatments Labs (all labs ordered are listed, but only abnormal results are displayed) Labs Reviewed  CBC - Abnormal; Notable for the following components:      Result Value   RBC 3.84 (*)    Hemoglobin 12.6 (*)    HCT 38.4 (*)    All other components within normal limits  COMPREHENSIVE METABOLIC PANEL - Abnormal; Notable for the following components:   CO2 21 (*)    Albumin 3.4 (*)    All other components within normal limits  TROPONIN I (HIGH SENSITIVITY)  TROPONIN I (HIGH SENSITIVITY)                                                                                                                          EKG  EKG Interpretation  Date/Time:  Friday June 24 2020 04:04:07 EST Ventricular Rate:  54 PR Interval:    QRS Duration: 94 QT Interval:  427 QTC Calculation: 405 R Axis:   91 Text Interpretation: Sinus rhythm Right axis deviation No acute changes Confirmed by Addison Lank [phone]) on 06/24/2020 5:02:22 AM      Radiology DG Chest 2 View  Result Date: 06/24/2020 CLINICAL DATA:  Left-sided chest pain radiating to the arm EXAM: CHEST - 2 VIEW COMPARISON:  04/23/2020 FINDINGS: Lower lung volumes than before with mild streaky density at the bases. No edema, effusion, or pneumothorax. Normal heart size and stable mediastinal contours IMPRESSION: Atelectasis at the lung bases. Electronically Signed  By: Monte Fantasia M.D.   On: 06/24/2020 05:36    Pertinent labs & imaging results that were available during my care of the patient were reviewed by me and considered in my medical decision making (see chart for details).  Medications Ordered in ED Medications  sodium chloride 0.9 % bolus 1,000 mL (1,000 mLs Intravenous New Bag/Given 06/24/20 0434)    Followed by  0.9 %  sodium chloride infusion (has no administration in time range)  alum & mag hydroxide-simeth (MAALOX/MYLANTA) 200-200-20 MG/5ML suspension 30 mL (30 mLs Oral Given 06/24/20 0435)    And  lidocaine (XYLOCAINE) 2 % viscous mouth solution 15 mL (15 mLs Oral Given 06/24/20 0434)  nitroGLYCERIN (NITROGLYN) 2 % ointment 0.5 inch (0.5 inches Topical Given 06/24/20 0529)                                                                                                                                    Procedures Procedures  (including critical care time)  Medical Decision Making / ED Course I have reviewed the nursing notes for this encounter and the patient's prior records (if available in EHR or on provided  paperwork).   Johnny [name] was evaluated in Emergency Department on 06/24/2020 for the symptoms described in the history of present illness. He was evaluated in the context of the global COVID-19 pandemic, which necessitated consideration that the patient might be at risk for infection with the SARS-CoV-2 virus that causes COVID-19. Institutional protocols and algorithms that pertain to the evaluation of patients at risk for COVID-19 are in a state of rapid change based on information released by regulatory bodies including the CDC and federal and state organizations. These policies and algorithms were followed during the patient's care in the ED.  Substernal chest pain similar to the patient's prior MI. Improved with nitroglycerin x3. EKG without acute ischemic changes or evidence of pericarditis. ACS is primary concern. Low suspicion for pulmonary embolism. Presentation not classic for aortic dissection or esophageal perforation. Also considering GI etiology.  Labs and chest x-ray ordered. Will provide patient with GI cocktail.  If pain does not improve will provide additional nitroglycerin.  Minimal relief. NTG ointment applied  Clinical Course as of 06/24/20 0728  Fri Jun 24, 2020  1610 Initial troponin negative.  Labs grossly reassuring. We will obtain a delta troponin.  Consulted cardiology and spoke with Dr. Stanford Breed, who will come by and evaluate the patient and determine dispo. [PC]    Clinical Course User Index [PC] Osie Amparo, Grayce Sessions, MD     Final Clinical Impression(s) / ED Diagnoses Final diagnoses:  Chest pain      This chart was dictated using voice recognition software.  Despite best efforts to proofread,  errors can occur which can change the documentation meaning.   Fatima Blank, MD 06/24/20 [phone]

## 2020-06-25 DIAGNOSIS — Z20822 Contact with and (suspected) exposure to covid-19: Secondary | ICD-10-CM | POA: Diagnosis not present

## 2020-06-25 DIAGNOSIS — I200 Unstable angina: Secondary | ICD-10-CM

## 2020-06-25 DIAGNOSIS — R0789 Other chest pain: Secondary | ICD-10-CM | POA: Diagnosis not present

## 2020-06-25 DIAGNOSIS — I25110 Atherosclerotic heart disease of native coronary artery with unstable angina pectoris: Secondary | ICD-10-CM | POA: Diagnosis not present

## 2020-06-25 DIAGNOSIS — I10 Essential (primary) hypertension: Secondary | ICD-10-CM | POA: Diagnosis not present

## 2020-06-25 LAB — BASIC METABOLIC PANEL
Anion gap: 8 (ref 5–15)
BUN: 9 mg/dL (ref 8–23)
CO2: 22 mmol/L (ref 22–32)
Calcium: 9 mg/dL (ref 8.9–10.3)
Chloride: 105 mmol/L (ref 98–111)
Creatinine, Ser: 1.03 mg/dL (ref 0.61–1.24)
GFR, Estimated: >60 mL/min (ref >60)
Glucose, Bld: 94 mg/dL (ref 70–99)
Potassium: 3.7 mmol/L (ref 3.5–5.1)
Sodium: 135 mmol/L (ref 135–145)

## 2020-06-25 LAB — CBC
HCT: 34 % — ABNORMAL LOW (ref 39.0–52.0)
Hemoglobin: 12.1 g/dL — ABNORMAL LOW (ref 13.0–17.0)
MCH: 34.8 pg — ABNORMAL HIGH (ref 26.0–34.0)
MCHC: 35.6 g/dL (ref 30.0–36.0)
MCV: 97.7 fL (ref 80.0–100.0)
Platelets: 166 10*3/uL (ref 150–400)
RBC: 3.48 MIL/uL — ABNORMAL LOW (ref 4.22–5.81)
RDW: 12.2 % (ref 11.5–15.5)
WBC: 7.7 10*3/uL (ref 4.0–10.5)
nRBC: 0 % (ref 0.0–0.2)

## 2020-06-25 MED ORDER — EZETIMIBE 10 MG PO TABS: 10.0000 mg | ORAL_TABLET | Freq: Every day | ORAL | 0 refills | Status: DC

## 2020-06-25 MED ORDER — AMLODIPINE BESYLATE 2.5 MG PO TABS: 2.5000 mg | ORAL_TABLET | Freq: Every day | ORAL | 0 refills | Status: DC

## 2020-06-25 MED ORDER — ROSUVASTATIN CALCIUM 20 MG PO TABS: 20.0000 mg | ORAL_TABLET | Freq: Every day | ORAL | 0 refills | Status: DC

## 2020-06-25 MED ORDER — RANOLAZINE ER 1000 MG PO TB12: 1000.0000 mg | ORAL_TABLET | Freq: Two times a day (BID) | ORAL | 2 refills | Status: DC

## 2020-06-25 NOTE — Progress Notes (Signed)
Progress Note  Patient Name: Johnny [name] Date of Encounter: 06/25/2020  Primary Cardiologist: [name], MD   Subjective   No acute events overnight.  No angina.  Has been ambulating the halls.  Inpatient Medications    Scheduled Meds: . amLODipine  2.5 mg Oral Daily  . aspirin  81 mg Oral Daily  . clopidogrel  75 mg Oral Q breakfast  . ezetimibe  10 mg Oral Daily  . heparin  5,000 Units Subcutaneous Q8H  . magnesium oxide  400 mg Oral Daily  . nebivolol  2.5 mg Oral Daily  . ranolazine  1,000 mg Oral BID  . rosuvastatin  20 mg Oral Daily  . sodium chloride flush  3 mL Intravenous Q12H  . sodium chloride flush  3 mL Intravenous Q12H  . tamsulosin  0.4 mg Oral Daily  . vitamin B-12  100 mcg Oral q morning - 10a   Continuous Infusions: . sodium chloride Stopped (06/24/20 1905)  . sodium chloride    . sodium chloride Stopped (06/25/20 0224)  . sodium chloride     PRN Meds: sodium chloride, sodium chloride, acetaminophen, diazepam, nitroGLYCERIN, ondansetron (ZOFRAN) IV, oxyCODONE-acetaminophen, sodium chloride flush, sodium chloride flush   Vital Signs    Vitals:   06/24/20 1941 06/24/20 2046 06/24/20 2241 06/25/20 0707  BP: 139/78 135/81 (!) 143/83 140/88  Pulse: (!) 52 (!) 59 65 68  Resp: 17 18 17 19   Temp: 98.2 F (36.8 C) 98.3 F (36.8 C) 98.7 F (37.1 C) 97.7 F (36.5 C)  TempSrc: Oral Oral Oral Oral  SpO2: 97% 98% 94%   Weight:    91.9 kg  Height:        Intake/Output Summary (Last 24 hours) at 06/25/2020 1106 Last data filed at 06/25/2020 0645 Gross per 24 hour  Intake 1790.3 ml  Output 1450 ml  Net 340.3 ml   Filed Weights   06/24/20 0404 06/25/20 0707  Weight: 91.6 kg 91.9 kg    Telemetry    Sinus rhythm- Personally Reviewed  ECG    No new- Personally Reviewed  Physical Exam   GEN: No acute distress.   Neck: No JVD Cardiac: RRR, no murmurs, rubs, or gallops.  Respiratory: Clear to auscultation bilaterally. GI: Soft,  nontender, non-distended  MS: No edema; No deformity. Neuro:  Nonfocal  Psych: Normal affect   Labs    Chemistry Recent Labs  Lab 06/24/20 0412 06/24/20 1112 06/25/20 0227  NA 137  --  135  K 3.7  --  3.7  CL 106  --  105  CO2 21*  --  22  GLUCOSE 92  --  94  BUN 11  --  9  CREATININE 1.05 1.00 1.03  CALCIUM 9.4  --  9.0  PROT 6.5  --   --   ALBUMIN 3.4*  --   --   AST 20  --   --   ALT 15  --   --   ALKPHOS 45  --   --   BILITOT 0.8  --   --   GFRNONAA >60 >60 >60  ANIONGAP 10  --  8     Hematology Recent Labs  Lab 06/24/20 0412 06/24/20 1112 06/25/20 0227  WBC 10.2 7.7 7.7  RBC 3.84* 3.78* 3.48*  HGB 12.6* 12.3* 12.1*  HCT 38.4* 38.5* 34.0*  MCV 100.0 101.9* 97.7  MCH 32.8 32.5 34.8*  MCHC 32.8 31.9 35.6  RDW 12.0 12.0 12.2  PLT 188 193  166    Cardiac EnzymesNo results for input(s): TROPONINI in the last 168 hours. No results for input(s): TROPIPOC in the last 168 hours.   BNPNo results for input(s): BNP, PROBNP in the last 168 hours.   DDimer No results for input(s): DDIMER in the last 168 hours.   Radiology    DG Chest 2 View  Result Date: 06/24/2020 CLINICAL DATA:  Left-sided chest pain radiating to the arm EXAM: CHEST - 2 VIEW COMPARISON:  04/23/2020 FINDINGS: Lower lung volumes than before with mild streaky density at the bases. No edema, effusion, or pneumothorax. Normal heart size and stable mediastinal contours IMPRESSION: Atelectasis at the lung bases. Electronically Signed   By: Monte Fantasia M.D.   On: 06/24/2020 05:36   CARDIAC CATHETERIZATION  Addendum Date: 06/24/2020    Ost LAD to Prox LAD lesion is 20% stenosed.  1st Mrg lesion is 80% stenosed.  1st Diag lesion is 80% stenosed.  Dist LAD-1 lesion is 60% stenosed.  Dist LAD-2 lesion is 90% stenosed.  Previously placed Prox Cx to Dist Cx stent (unknown type) is widely patent.  2nd Mrg lesion is 85% stenosed.  Prox RCA-1 lesion is 30% stenosed.  Prox RCA-2 lesion is 20% stenosed.   RPDA lesion is 30% stenosed.  Mid LAD lesion is 40% stenosed.  The left ventricular systolic function is normal.  LV end diastolic pressure is normal.  Lat 2nd Mrg lesion is 100% stenosed.  Diffuse previously noted multivessel CAD with mild proximal LAD irregularity, diffuse 80% diagonal stenoses with 40% mid LAD stenosis, distal 60% stenosis and very distal 90% stenosis in the very small caliber distal LAD the stent in the proximal to mid circumflex vessel is widely patent.  There is previously noted diffuse disease in a small first marginal branch and 80% ostial stenosis and a jailed OM 2 vessel with distal occlusion of a distal branch vessel noted previously.  The RCA is a dominant vessel with 30% proximal 20% mid 30% diffuse PDA stenosis. Normal LV contractility without focal segmental wall motion abnormalities.  EF 55 to 60%. RECOMMENDATION: Continue DAPT.  Consider changing back to ticagrelor due to improved potency compared to clopidogrel.  With diffuse disease and small caliber distal vessels, recommend increase medical therapy trial with titration of ranolazine to 1000 mg twice daily, initiate low-dose amlodipine (intolerant to nitrates due to migraine headaches), continue beta-blocker therapy, and aggressive lipid lowering treatment.   Result Date: 06/24/2020  Ost LAD to Prox LAD lesion is 20% stenosed.  1st Mrg lesion is 80% stenosed.  1st Diag lesion is 80% stenosed.  Dist LAD-1 lesion is 60% stenosed.  Dist LAD-2 lesion is 90% stenosed.  Previously placed Prox Cx to Dist Cx stent (unknown type) is widely patent.  2nd Mrg lesion is 85% stenosed.  Prox RCA-1 lesion is 30% stenosed.  Prox RCA-2 lesion is 20% stenosed.  RPDA lesion is 30% stenosed.  Mid LAD lesion is 40% stenosed.  The left ventricular systolic function is normal.  LV end diastolic pressure is normal.  Lat 2nd Mrg lesion is 100% stenosed.  Diffuse previously noted multivessel CAD with mild proximal LAD irregularity,  diffuse 80% diagonal stenoses with 40% mid LAD stenosis, distal 60% stenosis and very distal 90% stenosis in the very small caliber distal LAD the stent in the proximal to mid circumflex vessel is widely patent.  There is previously noted diffuse disease in a small first marginal branch and 80% ostial stenosis and a jailed OM 2 vessel  with distal occlusion of a distal branch vessel noted previously.  The RCA is a dominant vessel with 30% proximal 20% mid 30% diffuse PDA stenosis. Normal LV contractility without focal segmental wall motion abnormalities.  EF 55 to 60%. RECOMMENDATION: Continue DAPT.  Consider changing back to ticagrelor due to improved potency compared to clopidogrel.  With diffuse disease and small caliber distal vessels, recommend increase medical therapy trial with titration of ranolazine to 1000 mg twice daily, initiate nitrate therapy, continue beta-blocker therapy, and aggressive lipid lowering treatment.   ECHOCARDIOGRAM COMPLETE  Result Date: 06/24/2020    ECHOCARDIOGRAM REPORT   Patient Name:   MERION GRIMALDO Date of Exam: 06/24/2020 Medical Rec #:  [id number]        Height:       72.0 in Accession #:    [id number]       Weight:       201.9 lb Date of Birth:  03-22-1951         BSA:          2.139 m Patient Age:    71 years         BP:           124/74 mmHg Patient Gender: M                HR:           54 bpm. Exam Location:  Inpatient Procedure: 2D Echo, Cardiac Doppler and Color Doppler Indications:    Chest Pain R07.9  History:        Patient has prior history of Echocardiogram examinations, most                 recent 01/26/2018. Previous Myocardial Infarction and CAD, TIA,                 Signs/Symptoms:Shortness of Breath; Risk Factors:Hypertension,                 Dyslipidemia and Former Smoker. GERD.  Sonographer:    Vickie Epley RDCS Referring Phys: [place]  1. Left ventricular ejection fraction, by estimation, is 60 to 65%. The left ventricle has normal  function. The left ventricle has no regional wall motion abnormalities. Left ventricular diastolic parameters are consistent with Grade I diastolic dysfunction (impaired relaxation).  2. Right ventricular systolic function is normal. The right ventricular size is normal. There is normal pulmonary artery systolic pressure.  3. The mitral valve is normal in structure. No evidence of mitral valve regurgitation. No evidence of mitral stenosis.  4. The aortic valve is normal in structure. Aortic valve regurgitation is not visualized. No aortic stenosis is present.  5. Aortic dilatation noted. There is mild dilatation at the level of the sinuses of Valsalva, measuring 39 mm. There is mild dilatation of the ascending aorta, measuring 38 mm.  6. The inferior vena cava is normal in size with greater than 50% respiratory variability, suggesting right atrial pressure of 3 mmHg. Comparison(s): No significant change from prior study. Prior images reviewed side by side. FINDINGS  Left Ventricle: Left ventricular ejection fraction, by estimation, is 60 to 65%. The left ventricle has normal function. The left ventricle has no regional wall motion abnormalities. The left ventricular internal cavity size was normal in size. There is  no left ventricular hypertrophy. Left ventricular diastolic parameters are consistent with Grade I diastolic dysfunction (impaired relaxation). Right Ventricle: The right ventricular size is normal. No increase in right ventricular  wall thickness. Right ventricular systolic function is normal. There is normal pulmonary artery systolic pressure. The tricuspid regurgitant velocity is 1.80 m/s, and  with an assumed right atrial pressure of 3 mmHg, the estimated right ventricular systolic pressure is [id number] mmHg. Left Atrium: Left atrial size was normal in size. Right Atrium: Right atrial size was normal in size. Pericardium: There is no evidence of pericardial effusion. Mitral Valve: The mitral valve is  normal in structure. No evidence of mitral valve regurgitation. No evidence of mitral valve stenosis. Tricuspid Valve: The tricuspid valve is normal in structure. Tricuspid valve regurgitation is not demonstrated. No evidence of tricuspid stenosis. Aortic Valve: The aortic valve is normal in structure. Aortic valve regurgitation is not visualized. No aortic stenosis is present. Pulmonic Valve: The pulmonic valve was normal in structure. Pulmonic valve regurgitation is not visualized. No evidence of pulmonic stenosis. Aorta: Aortic dilatation noted. There is mild dilatation at the level of the sinuses of Valsalva, measuring 39 mm. There is mild dilatation of the ascending aorta, measuring 38 mm. Venous: The inferior vena cava is normal in size with greater than 50% respiratory variability, suggesting right atrial pressure of 3 mmHg. IAS/Shunts: No atrial level shunt detected by color flow Doppler.  LEFT VENTRICLE PLAX 2D LVOT diam:     2.30 cm      Diastology LV SV:         99           LV e' medial:    6.57 cm/s LV SV Index:   46           LV E/e' medial:  9.6 LVOT Area:     4.15 cm     LV e' lateral:   7.54 cm/s                             LV E/e' lateral: 8.4  LV Volumes (MOD) LV vol d, MOD A2C: 108.0 ml LV vol d, MOD A4C: 103.0 ml LV vol s, MOD A2C: 37.6 ml LV vol s, MOD A4C: 35.8 ml LV SV MOD A2C:     70.4 ml LV SV MOD A4C:     103.0 ml LV SV MOD BP:      69.4 ml RIGHT VENTRICLE RV S prime:     18.60 cm/s TAPSE (M-mode): 2.4 cm LEFT ATRIUM             Index       RIGHT ATRIUM           Index LA Vol (A2C):   32.7 ml 15.29 ml/m RA Area:     13.80 cm LA Vol (A4C):   44.9 ml 20.99 ml/m RA Volume:   28.70 ml  13.42 ml/m LA Biplane Vol: 41.8 ml 19.54 ml/m  AORTIC VALVE LVOT Vmax:   120.00 cm/s LVOT Vmean:  72.300 cm/s LVOT VTI:    0.238 m  AORTA Ao Root diam: 3.90 cm Ao Asc diam:  3.80 cm MITRAL VALVE               TRICUSPID VALVE MV Area (PHT): 2.24 cm    TR Peak grad:   13.0 mmHg MV Decel Time: 338 msec    TR  Vmax:        180.00 cm/s MV E velocity: 63.40 cm/s MV A velocity: 57.40 cm/s  SHUNTS MV E/A ratio:  1.10        Systemic VTI:  0.24 m                            Systemic Diam: 2.30 cm Candee Furbish MD Electronically signed by Candee Furbish MD Signature Date/Time: 06/24/2020/1:58:18 PM    Final     Cardiac Studies   June 24, 2020 left heart catheterization  Ost LAD to Prox LAD lesion is 20% stenosed.  1st Mrg lesion is 80% stenosed.  1st Diag lesion is 80% stenosed.  Dist LAD-1 lesion is 60% stenosed.  Dist LAD-2 lesion is 90% stenosed.  Previously placed Prox Cx to Dist Cx stent (unknown type) is widely patent.  2nd Mrg lesion is 85% stenosed.  Prox RCA-1 lesion is 30% stenosed.  Prox RCA-2 lesion is 20% stenosed.  RPDA lesion is 30% stenosed.  Mid LAD lesion is 40% stenosed.  The left ventricular systolic function is normal.  LV end diastolic pressure is normal.  Lat 2nd Mrg lesion is 100% stenosed.   Diffuse previously noted multivessel CAD with mild proximal LAD irregularity, diffuse 80% diagonal stenoses with 40% mid LAD stenosis, distal 60% stenosis and very distal 90% stenosis in the very small caliber distal LAD the stent in the proximal to mid circumflex vessel is widely patent.  There is previously noted diffuse disease in a small first marginal branch and 80% ostial stenosis and a jailed OM 2 vessel with distal occlusion of a distal branch vessel noted previously.  The RCA is a dominant vessel with 30% proximal 20% mid 30% diffuse PDA stenosis.  Normal LV contractility without focal segmental wall motion abnormalities.  EF 55 to 60%.  RECOMMENDATION: Continue DAPT.  Consider changing back to ticagrelor due to improved potency compared to clopidogrel.  With diffuse disease and small caliber distal vessels, recommend increase medical therapy trial with titration of ranolazine to 1000 mg twice daily, initiate low-dose amlodipine (intolerant to nitrates due to migraine  headaches), continue beta-blocker therapy, and aggressive lipid lowering treatment.   Patient Profile     70 y.o. male with severe multivessel coronary artery disease admitted with angina.  Cath showed no new lesions amenable to PCI.  Have increase his antianginal regimen and plan to discharge him later today.  Assessment & Plan    1.  Angina Stable.  Has been ambulating the hallways on an increased dose of Ranexa and now amlodipine without recurrent angina.  2.  Coronary artery disease Continue medical therapy including Crestor, ranolazine, nebivolol, Zetia, Plavix, aspirin, amlodipine.   3.  Hypertension Controlled   For questions or updates, please contact [place] Please consult [email or web address] for contact info under Cardiology/STEMI.      Signed, Lars Mage, MD  06/25/2020, 11:06 AM

## 2020-06-25 NOTE — Care Management Obs Status (Signed)
[place] NOTIFICATION   Patient Details  Name: ANUJ SUMMONS MRN: [id number] Date of Birth: 06/23/1950   Medicare Observation Status Notification Given:  Yes    Zenon Mayo, RN 06/25/2020, 1:10 PM

## 2020-06-25 NOTE — Progress Notes (Signed)
CARDIAC REHAB PHASE I   PRE:  Rate/Rhythm: 40 SR  BP:  Supine:   Sitting: 147/89  Standing:    SaO2: 97% RA  MODE:  Ambulation: 440 ft   POST:  Rate/Rhythm: 75 SR  BP:  Supine:   Sitting: 147/90  Standing:    SaO2: 96% RA Tolerated ambulation independently well without difficulty or angina.  Was seen by cardiac rehab 1 year ago and attended phase II cardiac rehab in [place].  Reviewed angina symptoms, NTG usage, givrn exercise progression and guidelines.  Patient receptive, wife was included on speaker phone. [phone] Liliane Channel RN, BSN 06/25/2020 10:32 AM   66 SR

## 2020-06-25 NOTE — Discharge Summary (Addendum)
Discharge Summary    Patient ID: Johnny [name] MRN: [id number]; DOB: Jun 17, 1950  Admit date: 06/24/2020 Discharge date: 06/25/2020  Primary Care Provider: [name], MD  Primary Cardiologist: [name], MD  Primary Electrophysiologist:  None   Discharge Diagnoses    Principal Problem:   Unstable angina [facility]) Active Problems:   HYPERCHOLESTEROLEMIA   Essential hypertension    Diagnostic Studies/Procedures    Cath: 06/24/20   Ost LAD to Prox LAD lesion is 20% stenosed.  1st Mrg lesion is 80% stenosed.  1st Diag lesion is 80% stenosed.  Dist LAD-1 lesion is 60% stenosed.  Dist LAD-2 lesion is 90% stenosed.  Previously placed Prox Cx to Dist Cx stent (unknown type) is widely patent.  2nd Mrg lesion is 85% stenosed.  Prox RCA-1 lesion is 30% stenosed.  Prox RCA-2 lesion is 20% stenosed.  RPDA lesion is 30% stenosed.  Mid LAD lesion is 40% stenosed.  The left ventricular systolic function is normal.  LV end diastolic pressure is normal.  Lat 2nd Mrg lesion is 100% stenosed.  Diffuse previously noted multivessel CAD with mild proximal LAD irregularity, diffuse 80% diagonal stenoses with 40% mid LAD stenosis, distal 60% stenosis and very distal 90% stenosis in the very small caliber distal LAD the stent in the proximal to mid circumflex vessel is widely patent. There is previously noted diffuse disease in a small first marginal branch and 80% ostial stenosis and a jailed OM 2 vessel with distal occlusion of a distal branch vessel noted previously. The RCA is a dominant vessel with 30% proximal 20% mid 30% diffuse PDA stenosis.  Normal LV contractility without focal segmental wall motion abnormalities. EF 55 to 60%.  RECOMMENDATION: Continue DAPT. Consider changing back to ticagrelor due to improved potency compared to clopidogrel. With diffuse disease and small caliber distal vessels, recommend increase medical therapy trial with titration of  ranolazine to 1000 mg twice daily, initiate low-dose amlodipine (intolerant to nitrates due to migraine headaches), continue beta-blocker therapy, and aggressive lipid lowering treatment.  Diagnostic Dominance: Right    _____________   History of Present Illness     Johnny [name] is a 70 y.o. male with a hx of HTN, HLD, TiA, GERD and CAD with prior MI, remote smoker, cath 2017 managed medically until 06/2019 with overlapping stents to m/dLCX who was seen for the evaluation of chest pain at the request of Dr. Leonette Monarch.  Johnny [name] with above hx and CAD disease treated medically ujuntil 06/2019 and rec'd stents overlapping to to m/dLCX and residual 35% PRCA, 80-90%1st diag, 90% 2nd diag and 65% dLAD, OM 1 80% and ostial OM2 50%.  EF 55-65%.   Echo 2019 with EF 60% G1DD, trivial MR and TR and normal RV.  Pt intolerant to nitrates and statins but has been able to tolerate crestor.   Pt presented to ER at 0400 AM with chest pain that woke him from sleep around 0200.  He went [place] fire station, to be evaluated and was given ASA 324 mg and total of 3 SL NTG. On Arrival to ER still with 3/10 pain.  His pain was lt sided chest pain with radiation to lt arm, similar to prior episodes with stents.  Pain began as mild and increased to severe. NTG SL did help but pain continued.  No fevers, cough, abd pain no N or V.   Improved after receiving maalox and GI cocktail and NTG paste.  He did note post stent he could do  well with walking but then he slowed down and now with walking his pain returns a pinching pain.  But that had increased as well.    EKG:  The EKG was personally reviewed and demonstrates:  SR with RAD some mild ST depression inf leads but also with artifact. Will repeat. No acute changes Telemetry:  Telemetry was personally reviewed and demonstrates:  SR  Na 137, K+ 3.7 BUN 11, Cr 1.05 LFTs WNL  Hs troponin 3 and 4 WBC 10.2, Hgb 12.6, plts 188  COVID negative  Labs 1 week prior to   with A1c of 5.3  LDL 103, HDL 44 TG 188 In addition to above meds he has rec'd GI cocktaily and NTG paste   2V CXR NAD only atelectasis at lung bases  He was admitted for further management and plans for cardiac cath.  Hospital Course     Underwent cardiac cath noted above with Dr. Claiborne Billings with no targets amenable to PCI. With diffuse disease and small caliber distal vessels it was recommended to increase medical therapy with up titration of Ranexa to 1000mg  BID and add amlodipine in addition to home medications. He was able to ambulate in the hallways post cath without recurrent chest pain. Also increased his Crestor from 5mg  to 20mg  daily prior to discharge and added Zetia.    He was seen by Dr. Quentin Ore and determined stable for discharge. Follow up in the office will be arranged.   Did the patient have an acute coronary syndrome (MI, NSTEMI, STEMI, etc) this admission?:  No                               Did the patient have a percutaneous coronary intervention (stent / angioplasty)?:  No.       _____________  Discharge Vitals Blood pressure 140/88, pulse 68, temperature 97.7 F (36.5 C), temperature source Oral, resp. rate 19, height 6' (1.829 m), weight 91.9 kg, SpO2 94 %.  Filed Weights   06/24/20 0404 06/25/20 0707  Weight: 91.6 kg 91.9 kg    Labs & Radiologic Studies    CBC Recent Labs    06/24/20 1112 06/25/20 0227  WBC 7.7 7.7  HGB 12.3* 12.1*  HCT 38.5* 34.0*  MCV 101.9* 97.7  PLT 193 [id number]   Basic Metabolic Panel Recent Labs    06/24/20 0412 06/24/20 1112 06/25/20 0227  NA 137  --  135  K 3.7  --  3.7  CL 106  --  105  CO2 21*  --  22  GLUCOSE 92  --  94  BUN 11  --  9  CREATININE 1.05 1.00 1.03  CALCIUM 9.4  --  9.0  MG  --  2.0  --    Liver Function Tests Recent Labs    06/24/20 0412  AST 20  ALT 15  ALKPHOS 45  BILITOT 0.8  PROT 6.5  ALBUMIN 3.4*   No results for input(s): LIPASE, AMYLASE in the last 72 hours. High Sensitivity Troponin:    Recent Labs  Lab 06/24/20 0412 06/24/20 0612  TROPONINIHS 3 4    BNP Invalid input(s): POCBNP D-Dimer No results for input(s): DDIMER in the last 72 hours. Hemoglobin A1C No results for input(s): HGBA1C in the last 72 hours. Fasting Lipid Panel No results for input(s): CHOL, HDL, LDLCALC, TRIG, CHOLHDL, LDLDIRECT in the last 72 hours. Thyroid Function Tests Recent Labs    06/24/20 1112  TSH 0.664   _____________  DG Chest 2 View  Result Date: 06/24/2020 CLINICAL DATA:  Left-sided chest pain radiating to the arm EXAM: CHEST - 2 VIEW COMPARISON:  04/23/2020 FINDINGS: Lower lung volumes than before with mild streaky density at the bases. No edema, effusion, or pneumothorax. Normal heart size and stable mediastinal contours IMPRESSION: Atelectasis at the lung bases. Electronically Signed   By: Monte Fantasia M.D.   On: 06/24/2020 05:36   CARDIAC CATHETERIZATION  Addendum Date: 06/24/2020    Ost LAD to Prox LAD lesion is 20% stenosed.  1st Mrg lesion is 80% stenosed.  1st Diag lesion is 80% stenosed.  Dist LAD-1 lesion is 60% stenosed.  Dist LAD-2 lesion is 90% stenosed.  Previously placed Prox Cx to Dist Cx stent (unknown type) is widely patent.  2nd Mrg lesion is 85% stenosed.  Prox RCA-1 lesion is 30% stenosed.  Prox RCA-2 lesion is 20% stenosed.  RPDA lesion is 30% stenosed.  Mid LAD lesion is 40% stenosed.  The left ventricular systolic function is normal.  LV end diastolic pressure is normal.  Lat 2nd Mrg lesion is 100% stenosed.  Diffuse previously noted multivessel CAD with mild proximal LAD irregularity, diffuse 80% diagonal stenoses with 40% mid LAD stenosis, distal 60% stenosis and very distal 90% stenosis in the very small caliber distal LAD the stent in the proximal to mid circumflex vessel is widely patent.  There is previously noted diffuse disease in a small first marginal branch and 80% ostial stenosis and a jailed OM 2 vessel with distal occlusion of a distal  branch vessel noted previously.  The RCA is a dominant vessel with 30% proximal 20% mid 30% diffuse PDA stenosis. Normal LV contractility without focal segmental wall motion abnormalities.  EF 55 to 60%. RECOMMENDATION: Continue DAPT.  Consider changing back to ticagrelor due to improved potency compared to clopidogrel.  With diffuse disease and small caliber distal vessels, recommend increase medical therapy trial with titration of ranolazine to 1000 mg twice daily, initiate low-dose amlodipine (intolerant to nitrates due to migraine headaches), continue beta-blocker therapy, and aggressive lipid lowering treatment.   Result Date: 06/24/2020  Ost LAD to Prox LAD lesion is 20% stenosed.  1st Mrg lesion is 80% stenosed.  1st Diag lesion is 80% stenosed.  Dist LAD-1 lesion is 60% stenosed.  Dist LAD-2 lesion is 90% stenosed.  Previously placed Prox Cx to Dist Cx stent (unknown type) is widely patent.  2nd Mrg lesion is 85% stenosed.  Prox RCA-1 lesion is 30% stenosed.  Prox RCA-2 lesion is 20% stenosed.  RPDA lesion is 30% stenosed.  Mid LAD lesion is 40% stenosed.  The left ventricular systolic function is normal.  LV end diastolic pressure is normal.  Lat 2nd Mrg lesion is 100% stenosed.  Diffuse previously noted multivessel CAD with mild proximal LAD irregularity, diffuse 80% diagonal stenoses with 40% mid LAD stenosis, distal 60% stenosis and very distal 90% stenosis in the very small caliber distal LAD the stent in the proximal to mid circumflex vessel is widely patent.  There is previously noted diffuse disease in a small first marginal branch and 80% ostial stenosis and a jailed OM 2 vessel with distal occlusion of a distal branch vessel noted previously.  The RCA is a dominant vessel with 30% proximal 20% mid 30% diffuse PDA stenosis. Normal LV contractility without focal segmental wall motion abnormalities.  EF 55 to 60%. RECOMMENDATION: Continue DAPT.  Consider changing back to ticagrelor due  to  improved potency compared to clopidogrel.  With diffuse disease and small caliber distal vessels, recommend increase medical therapy trial with titration of ranolazine to 1000 mg twice daily, initiate nitrate therapy, continue beta-blocker therapy, and aggressive lipid lowering treatment.   ECHOCARDIOGRAM COMPLETE  Result Date: 06/24/2020    ECHOCARDIOGRAM REPORT   Patient Name:   Johnny [name] Date of Exam: 06/24/2020 Medical Rec #:  [id number]        Height:       72.0 in Accession #:    [id number]       Weight:       201.9 lb Date of Birth:  [birth year]         BSA:          2.139 m Patient Age:    11 years         BP:           124/74 mmHg Patient Gender: M                HR:           54 bpm. Exam Location:  Inpatient Procedure: 2D Echo, Cardiac Doppler and Color Doppler Indications:    Chest Pain R07.9  History:        Patient has prior history of Echocardiogram examinations, most                 recent 01/26/2018. Previous Myocardial Infarction and CAD, TIA,                 Signs/Symptoms:Shortness of Breath; Risk Factors:Hypertension,                 Dyslipidemia and Former Smoker. GERD.  Sonographer:    Vickie Epley RDCS Referring Phys: [place]  1. Left ventricular ejection fraction, by estimation, is 60 to 65%. The left ventricle has normal function. The left ventricle has no regional wall motion abnormalities. Left ventricular diastolic parameters are consistent with Grade I diastolic dysfunction (impaired relaxation).  2. Right ventricular systolic function is normal. The right ventricular size is normal. There is normal pulmonary artery systolic pressure.  3. The mitral valve is normal in structure. No evidence of mitral valve regurgitation. No evidence of mitral stenosis.  4. The aortic valve is normal in structure. Aortic valve regurgitation is not visualized. No aortic stenosis is present.  5. Aortic dilatation noted. 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 There is mild dilatation of the ascending aorta, measuring 38 mm.  6. The inferior vena cava is normal in size with greater than 50% respiratory variability, suggesting right atrial pressure of 3 mmHg. Comparison(s): No significant change from prior study. Prior images reviewed side by side. FINDINGS  Left Ventricle: Left ventricular ejection fraction, by estimation, is 60 to 65%. The left ventricle has normal function. The left ventricle has no regional wall motion abnormalities. The left ventricular internal cavity size was normal in size. There is  no left ventricular hypertrophy. Left ventricular diastolic parameters are consistent with Grade I diastolic dysfunction (impaired relaxation). Right Ventricle: The right ventricular size is normal. No increase in right ventricular wall thickness. Right ventricular systolic function is normal. There is normal pulmonary artery systolic pressure. The tricuspid regurgitant velocity is 1.80 m/s, and  with an assumed right atrial pressure of 3 mmHg, the estimated right ventricular systolic pressure is 37.1 mmHg. Left Atrium: Left atrial size was normal in size.  Right Atrium: Right atrial size was normal in size. Pericardium: There is no evidence of pericardial effusion. Mitral Valve: The mitral valve is normal in structure. No evidence of mitral valve regurgitation. No evidence of mitral valve stenosis. Tricuspid Valve: The tricuspid valve is normal in structure. Tricuspid valve regurgitation is not demonstrated. No evidence of tricuspid stenosis. Aortic Valve: The aortic valve is normal in structure. Aortic valve regurgitation is not visualized. No aortic stenosis is present. Pulmonic Valve: The pulmonic valve was normal in structure. Pulmonic valve regurgitation is not visualized. No evidence of pulmonic stenosis. Aorta: Aortic dilatation noted. There is mild dilatation at the level of the sinuses of Valsalva, measuring 39 mm. There is mild  dilatation of the ascending aorta, measuring 38 mm. Venous: The inferior vena cava is normal in size with greater than 50% respiratory variability, suggesting right atrial pressure of 3 mmHg. IAS/Shunts: No atrial level shunt detected by color flow Doppler.  LEFT VENTRICLE PLAX 2D LVOT diam:     2.30 cm      Diastology LV SV:         99           LV e' medial:    6.57 cm/s LV SV Index:   46           LV E/e' medial:  9.6 LVOT Area:     4.15 cm     LV e' lateral:   7.54 cm/s                             LV E/e' lateral: 8.4  LV Volumes (MOD) LV vol d, MOD A2C: 108.0 ml LV vol d, MOD A4C: 103.0 ml LV vol s, MOD A2C: 37.6 ml LV vol s, MOD A4C: 35.8 ml LV SV MOD A2C:     70.4 ml LV SV MOD A4C:     103.0 ml LV SV MOD BP:      69.4 ml RIGHT VENTRICLE RV S prime:     18.60 cm/s TAPSE (M-mode): 2.4 cm LEFT ATRIUM             Index       RIGHT ATRIUM           Index LA Vol (A2C):   32.7 ml 15.29 ml/m RA Area:     13.80 cm LA Vol (A4C):   44.9 ml 20.99 ml/m RA Volume:   28.70 ml  13.42 ml/m LA Biplane Vol: 41.8 ml 19.54 ml/m  AORTIC VALVE LVOT Vmax:   120.00 cm/s LVOT Vmean:  72.300 cm/s LVOT VTI:    0.238 m  AORTA Ao Root diam: 3.90 cm Ao Asc diam:  3.80 cm MITRAL VALVE               TRICUSPID VALVE MV Area (PHT): 2.24 cm    TR Peak grad:   13.0 mmHg MV Decel Time: 338 msec    TR Vmax:        180.00 cm/s MV E velocity: 63.40 cm/s MV A velocity: 57.40 cm/s  SHUNTS MV E/A ratio:  1.10        Systemic VTI:  0.24 m                            Systemic Diam: 2.30 cm Donato Schultz MD Electronically signed by Donato Schultz MD Signature Date/Time: 06/24/2020/1:58:18 PM    Final  Disposition   Pt is being discharged home today in good condition.  Follow-up Plans & Appointments     Follow-up Information    [name], MD Follow up.   Specialty: Cardiology Why: Office will call you with a follow up appt within the next 2-3 business days Contact information: [address] [place]  [phone]              Discharge Instructions    Call MD for:  redness, tenderness, or signs of infection (pain, swelling, redness, odor or green/yellow discharge around incision site)   Complete by: As directed    Diet - low sodium heart healthy   Complete by: As directed    Discharge instructions   Complete by: As directed    Radial Site Care Refer to this sheet in the next few weeks. These instructions provide you with information on caring for yourself after your procedure. Your caregiver may also give you more specific instructions. Your treatment has been planned according to current medical practices, but problems sometimes occur. Call your caregiver if you have any problems or questions after your procedure. HOME CARE INSTRUCTIONS You may shower the day after the procedure.Remove the bandage (dressing) and gently wash the site with plain soap and water.Gently pat the site dry.  Do not apply powder or lotion to the site.  Do not submerge the affected site in water for 3 to 5 days.  Inspect the site at least twice daily.  Do not flex or bend the affected arm for 24 hours.  No lifting over 5 pounds (2.3 kg) for 5 days after your procedure.  Do not drive home if you are discharged the same day of the procedure. Have someone else drive you.  You may drive 24 hours after the procedure unless otherwise instructed by your caregiver.  What to expect: Any bruising will usually fade within 1 to 2 weeks.  Blood that collects in the tissue (hematoma) may be painful to the touch. It should usually decrease in size and tenderness within 1 to 2 weeks.  SEEK IMMEDIATE MEDICAL CARE IF: You have unusual pain at the radial site.  You have redness, warmth, swelling, or pain at the radial site.  You have drainage (other than a small amount of blood on the dressing).  You have chills.  You have a fever or persistent symptoms for more than 72 hours.  You have a fever and your symptoms  suddenly get worse.  Your arm becomes pale, cool, tingly, or numb.  You have heavy bleeding from the site. Hold pressure on the site.   Increase activity slowly   Complete by: As directed       Discharge Medications   Allergies as of 06/25/2020      Reactions   Atorvastatin Other (See Comments)   Joint pain   Praluent [alirocumab] Other (See Comments)   Muscle pain and flu like symptoms   Hydrocodone Nausea Only   Can take with food   Levofloxacin Nausea And Vomiting   Levofloxacin In D5w Nausea And Vomiting   Metadate Cd [methylphenidate Hcl] Other (See Comments)   Burning, GI upset, bloating   Morphine And Related Nausea And Vomiting   Other Other (See Comments)   Burning, GI upset, bloating   Pravastatin Other (See Comments)      Medication List    TAKE these medications   amLODipine 2.5 MG tablet Commonly known as: NORVASC Take 1 tablet (  2.5 mg total) by mouth daily. Start taking on: June 26, 2020   aspirin 81 MG EC tablet Take 1 tablet (81 mg total) by mouth daily.   clopidogrel 75 MG tablet Commonly known as: PLAVIX Take 1 tablet (75 mg total) by mouth daily.   ezetimibe 10 MG tablet Commonly known as: ZETIA Take 1 tablet (10 mg total) by mouth daily. Start taking on: June 26, 2020   Magnesium 400 MG Tabs Take 1 tablet by mouth daily.   nebivolol 2.5 MG tablet Commonly known as: BYSTOLIC Take 1 tablet (2.5 mg total) by mouth daily.   nitroGLYCERIN 0.4 MG SL tablet Commonly known as: NITROSTAT Place 0.4 mg under the tongue every 5 (five) minutes as needed for chest pain.   oxyCODONE-acetaminophen 10-325 MG tablet Commonly known as: PERCOCET Take 1 tablet by mouth every 6 (six) hours as needed for pain.   ranolazine 1000 MG SR tablet Commonly known as: RANEXA Take 1 tablet (1,000 mg total) by mouth 2 (two) times daily. What changed:   medication strength  how much to take   rosuvastatin 20 MG tablet Commonly known as: CRESTOR Take 1  tablet (20 mg total) by mouth daily. Start taking on: June 26, 2020 What changed:   medication strength  how much to take   tamsulosin 0.4 MG Caps capsule Commonly known as: FLOMAX Take 1 capsule (0.4 mg total) by mouth daily.   Vitamin B-12 1000 MCG Subl Place 1 each under the tongue every morning.       Outstanding Labs/Studies   FLP/LFTs in 8 weeks if tolerating statin increase  Duration of Discharge Encounter   Greater than 30 minutes including physician time.  Signed, Reino Bellis, NP 06/25/2020, 1:36 PM

## 2020-06-27 ENCOUNTER — Other Ambulatory Visit: Payer: Self-pay

## 2020-06-27 ENCOUNTER — Encounter (HOSPITAL_COMMUNITY): Payer: Self-pay | Admitting: Cardiovascular Disease

## 2020-06-29 ENCOUNTER — Other Ambulatory Visit: Payer: Self-pay

## 2020-06-29 MED ORDER — ROSUVASTATIN CALCIUM 20 MG PO TABS: 20.0000 mg | ORAL_TABLET | Freq: Every day | ORAL | 0 refills | Status: DC

## 2020-07-18 ENCOUNTER — Encounter: Payer: Self-pay | Admitting: Internal Medicine

## 2020-07-25 NOTE — Progress Notes (Signed)
Cardiology Office Note:    Date:  07/26/2020   ID:  ANUAR WALGREN, DOB 1951-04-18, MRN [id number]  PCP:  [name], MD   [place]  Cardiologist:  [name], MD   Advanced Practice Provider:  No care team member to display Electrophysiologist:  None       Referring MD: [name], MD   Chief Complaint:  Hospitalization Follow-up (Chest pain >> s/p Cath >> Med Rx)    Patient Profile:    HUEY SCALIA is a 70 y.o. male with:   Coronary artery disease   S/p MI; med Rx  S/p DES x 2 to LCx in 06/2019  Cath 2/22: LCx stent patent; mod to severe distal and small branch vessel dz>>Med Rx  Intol of nitrates 2/2 HAs  Hypertension   Hyperlipidemia   Statin intol; able to tol low dose rosuvastatin   Hesitant to start alt Rx  Hx of TIA  GERD  Lung nodules  Aortic atherosclerosis   Prior CV studies: Cardiac catheterization 2020-07-09 LAD ost 20, mid 53, dist 60/90; D1 80 (small) LCx stent patent; OM1 80 (small); Lat OM2 100 RCA prox 30/20; RPDA 30 EF 55-65    Echocardiogram 07-09-20 EF 60-65, no RWMA, Gr 1 DD, mild dilation at level of SoV (39 mm), ascending aorta (38 mm)  Chest CT 07/08/19 Small bilateral pulmonary nodules, 20mm (repeat CT 06/2020) Old granulomatous disease CAD Aortic atherosclerosisi Emphysema  Carotid US 07/02/14 Bilat ICA 1-39    History of Present Illness:    Mr. Blew was last seen by Dr. [name] in 4/21.  He was admitted 2/4-2/5 with unstable angina.  His hs-Troponins were normal.  Cardiac catheterization demonstrated a patent stent in the LCx.  He had mod to severe distal and small branch vessel disease.  Med Rx was recommended.  His Ranolazine was increased and he was placed amlodipine.  His Rosuvastatin dose was increased to 20 mg as well.  He returns for f/u.    Since discharge, he developed dysuria as well as discolored urine.  His urine was quite dark.  He changed ranolazine back to 500 mg twice  daily.  His symptoms resolved.  He still has occasional slight chest discomfort with more extreme exertion.  He has not had any worsening symptoms.  He has not had significant shortness of breath, syncope, leg edema.      Past Medical History:  Diagnosis Date  . Adenomatous polyps   . Bronchitis   . CAD in native artery 2017  . Chronic back pain   . GERD (gastroesophageal reflux disease)   . Hyperlipidemia   . Hypertension   . Internal hemorrhoids   . Low testosterone   . Myocardial infarction ([facility])   . Osteopenia   . Shortness of breath dyspnea   . Spondylosis   . TIA (transient ischemic attack) 2008  . Ureterolithiasis   . Ventral hernia   . Vitamin D deficiency     Current Medications: Current Meds  Medication Sig  . amLODipine (NORVASC) 2.5 MG tablet Take 1 tablet (2.5 mg total) by mouth daily.  Marland Kitchen aspirin EC 81 MG EC tablet Take 1 tablet (81 mg total) by mouth daily.  . clopidogrel (PLAVIX) 75 MG tablet Take 1 tablet (75 mg total) by mouth daily.  . Cyanocobalamin (VITAMIN B-12) 1000 MCG SUBL Place 1 each under the tongue every morning.  . Magnesium 400 MG TABS Take 1 tablet by mouth daily.  Marland Kitchen  nebivolol (BYSTOLIC) 2.5 MG tablet Take 1 tablet (2.5 mg total) by mouth daily.  . nitroGLYCERIN (NITROSTAT) 0.4 MG SL tablet Place 0.4 mg under the tongue every 5 (five) minutes as needed for chest pain.  Marland Kitchen oxyCODONE-acetaminophen (PERCOCET) 10-325 MG tablet Take 1 tablet by mouth every 6 (six) hours as needed for pain.   . ranolazine (RANEXA) 500 MG 12 hr tablet Take 500 mg by mouth 2 (two) times daily.  . rosuvastatin (CRESTOR) 5 MG tablet Take 5 mg by mouth daily.  . tamsulosin (FLOMAX) 0.4 MG CAPS capsule Take 1 capsule (0.4 mg total) by mouth daily.     Allergies:   Atorvastatin, Praluent [alirocumab], Hydrocodone, Levofloxacin, Levofloxacin in d5w, Metadate cd [methylphenidate hcl], Morphine and related, Other, and Pravastatin   Social History   Tobacco Use  . Smoking  status: Former Smoker    Years: 10.00    Types: Cigarettes    Quit date: 01/04/1978    Years since quitting: 42.5  . Smokeless tobacco: Never Used  Vaping Use  . Vaping Use: Never used  Substance Use Topics  . Alcohol use: No    Alcohol/week: 0.0 standard drinks    Comment: rarely  . Drug use: No     Family Hx: The patient's family history includes Emphysema in his father; Heart attack in his mother; Heart disease in his mother; Stroke in his mother.  Review of Systems  Genitourinary:       Urine discoloration     EKGs/Labs/Other Test Reviewed:    EKG:  EKG is   ordered today.  The ekg ordered today demonstrates sinus bradycardia, HR 54, increased artifact, no ST-T wave changes, QTC 450  Recent Labs: 06/24/2020: ALT 15; Magnesium 2.0; TSH 0.664 06/25/2020: BUN 9; Creatinine, Ser 1.03; Hemoglobin 12.1; Platelets 166; Potassium 3.7; Sodium 135   Recent Lipid Panel Lab Results  Component Value Date/Time   CHOL 177 06/14/2020 09:11 AM   CHOL 135 01/19/2020 08:08 AM   TRIG 188 (H) 06/14/2020 09:11 AM   HDL 44 06/14/2020 09:11 AM   HDL 44 01/19/2020 08:08 AM   CHOLHDL 4.0 06/14/2020 09:11 AM   LDLCALC 103 (H) 06/14/2020 09:11 AM      Risk Assessment/Calculations:      Physical Exam:    VS:  BP 116/80   Pulse (!) 54   Ht 6' (1.829 m)   Wt 202 lb 3.2 oz (91.7 kg)   SpO2 96%   BMI 27.42 kg/m     Wt Readings from Last 3 Encounters:  07/26/20 202 lb 3.2 oz (91.7 kg)  06/25/20 202 lb 8 oz (91.9 kg)  06/17/20 202 lb (91.6 kg)     Constitutional:      Appearance: Healthy appearance. Not in distress.  Neck:     Vascular: No JVR. JVD normal.  Pulmonary:     Effort: Pulmonary effort is normal.     Breath sounds: No wheezing. No rales.  Cardiovascular:     Normal rate. Regular rhythm. Normal S1. Normal S2.     Murmurs: There is no murmur.     Comments: Right wrist without hematoma Edema:    Peripheral edema absent.  Abdominal:     Palpations: Abdomen is soft.  There is no hepatomegaly.  Skin:    General: Skin is warm and dry.  Neurological:     General: No focal deficit present.     Mental Status: Alert and oriented to person, place and time.     Cranial  Nerves: Cranial nerves are intact.       ASSESSMENT & PLAN:    1. Coronary artery disease involving native coronary artery of native heart with angina pectoris ([place]) History of remote myocardial infarction.  He is status post DES to the LCx and 2/21.  Most recent cardiac catheterization last month demonstrated patent LCx stent and moderate to severe distal and small branch vessel disease.  Medical therapy has been recommended.  He could not tolerate higher dose ranolazine.  He developed what sounds like hematuria.  This did resolve.  I will obtain a follow-up BMET, CBC today.  If he has recurrent symptoms, he should see primary care to get a urinalysis.  Continue current dose of amlodipine, aspirin, clopidogrel, nebivolol, rosuvastatin, ranolazine.  We could try to increase his amlodipine if his symptoms should worsen over time.  He can also try sublingual nitroglycerin prior to activity that he knows will contribute to his angina.  Follow-up with Dr. [name] in 6 months.  2. Essential hypertension The patient's blood pressure is controlled on his current regimen.  Continue current therapy.   3. Pure hypercholesterolemia He is intol of several cholesterol medications.  LDL above goal.  We discussed bempedoic acid as well as Inclisiran.  He would like to look into these first.  I will refer to our lipid clinic if he decides to try them  4. Pulmonary nodules He is due for follow-up chest CT.  This will be arranged.  5. Aortic atherosclerosis (HCC) Continue aspirin, statin therapy    Dispo:  Return in about 6 months (around 01/26/2021) for Routine Follow Up with Dr. [name], in person.   Medication Adjustments/Labs and Tests Ordered: Current medicines are reviewed at length with the patient today.   Concerns regarding medicines are outlined above.  Tests Ordered: Orders Placed This Encounter  Procedures  . CT Chest Wo Contrast  . Basic metabolic panel  . CBC  . EKG 12-Lead   Medication Changes: No orders of the defined types were placed in this encounter.   Signed, Richardson Dopp, PA-C  07/26/2020 12:02 PM    [facility] [place], [place], [place]  [address] Phone: [phone]; Fax: [phone]

## 2020-07-26 ENCOUNTER — Ambulatory Visit: Payer: Medicare Other | Admitting: Physician Assistant

## 2020-07-26 ENCOUNTER — Other Ambulatory Visit: Payer: Self-pay

## 2020-07-26 ENCOUNTER — Encounter: Payer: Self-pay | Admitting: Physician Assistant

## 2020-07-26 VITALS — BP 116/80 | HR 54 | Ht 72.0 in | Wt 202.2 lb

## 2020-07-26 DIAGNOSIS — G720 Drug-induced myopathy: Secondary | ICD-10-CM

## 2020-07-26 DIAGNOSIS — I25119 Atherosclerotic heart disease of native coronary artery with unspecified angina pectoris: Secondary | ICD-10-CM

## 2020-07-26 DIAGNOSIS — T466X5A Adverse effect of antihyperlipidemic and antiarteriosclerotic drugs, initial encounter: Secondary | ICD-10-CM

## 2020-07-26 DIAGNOSIS — E7800 Pure hypercholesterolemia, unspecified: Secondary | ICD-10-CM

## 2020-07-26 DIAGNOSIS — I700 Atherosclerosis of aorta: Secondary | ICD-10-CM

## 2020-07-26 DIAGNOSIS — R918 Other nonspecific abnormal finding of lung field: Secondary | ICD-10-CM | POA: Diagnosis not present

## 2020-07-26 DIAGNOSIS — I10 Essential (primary) hypertension: Secondary | ICD-10-CM | POA: Diagnosis not present

## 2020-07-26 LAB — CBC
Hematocrit: 42.5 % (ref 37.5–51.0)
Hemoglobin: 14.5 g/dL (ref 13.0–17.7)
MCH: 32.6 pg (ref 26.6–33.0)
MCHC: 34.1 g/dL (ref 31.5–35.7)
MCV: 96 fL (ref 79–97)
Platelets: 252 10*3/uL (ref 150–450)
RBC: 4.45 x10E6/uL (ref 4.14–5.80)
RDW: 11.7 % (ref 11.6–15.4)
WBC: 7.9 10*3/uL (ref 3.4–10.8)

## 2020-07-26 LAB — BASIC METABOLIC PANEL
BUN/Creatinine Ratio: 13 (ref 10–24)
BUN: 13 mg/dL (ref 8–27)
CO2: 21 mmol/L (ref 20–29)
Calcium: 10.4 mg/dL — ABNORMAL HIGH (ref 8.6–10.2)
Chloride: 101 mmol/L (ref 96–106)
Creatinine, Ser: 1.04 mg/dL (ref 0.76–1.27)
Glucose: 90 mg/dL (ref 65–99)
Potassium: 4.5 mmol/L (ref 3.5–5.2)
Sodium: 135 mmol/L (ref 134–144)
eGFR: 79 mL/min/{1.73_m2} (ref >59)

## 2020-07-26 NOTE — Patient Instructions (Addendum)
Medication Instructions:  Continue your current medications.  *If you need a refill on your cardiac medications before your next appointment, please call your pharmacy*   Lab Work: Today - BMET, CBC If you have labs (blood work) drawn today and your tests are completely normal, you will receive your results only by: Marland Kitchen MyChart Message (if you have MyChart) OR . A paper copy in the mail If you have any lab test that is abnormal or we need to change your treatment, we will call you to review the results.   Testing/Procedures: Non-Cardiac CT scanning, (CAT scanning), is a noninvasive, special x-ray that produces cross-sectional images of the body using x-rays and a computer. CT scans help physicians diagnose and treat medical conditions. For some CT exams, a contrast material is used to enhance visibility in the area of the body being studied. CT scans provide greater clarity and reveal more details than regular x-ray exams.    Follow-Up: At [facility], you and your health needs are our priority.  As part of our continuing mission to provide you with exceptional heart care, we have created designated Provider Care Teams.  These Care Teams include your primary Cardiologist (physician) and Advanced Practice Providers (APPs -  Physician Assistants and Nurse Practitioners) who all work together to provide you with the care you need, when you need it.  We recommend signing up for the patient portal called "MyChart".  Sign up information is provided on this After Visit Summary.  MyChart is used to connect with patients for Virtual Visits (Telemedicine).  Patients are able to view lab/test results, encounter notes, upcoming appointments, etc.  Non-urgent messages can be sent to your provider as well.   To learn more about what you can do with MyChart, go to [email or web address].    Your next appointment:   6 month(s)  The format for your next appointment:   In Person  Provider:   [name], MD   Other Instructions  If discolored urine returns, follow up with primary care for a urinalysis  The 2 newer cholesterol medications are: Bempedoic Acid (Nexletol) Inclisiran Marion Downer) If you would like to try these, let us know.

## 2020-08-02 ENCOUNTER — Other Ambulatory Visit: Payer: Self-pay

## 2020-08-02 ENCOUNTER — Ambulatory Visit (HOSPITAL_COMMUNITY)
Admission: RE | Admit: 2020-08-02 | Discharge: 2020-08-02 | Disposition: A | Discharge status: Home / Self Care | Payer: Medicare Other | Source: Ambulatory Visit | Attending: Physician Assistant | Admitting: Physician Assistant

## 2020-08-02 DIAGNOSIS — I10 Essential (primary) hypertension: Secondary | ICD-10-CM

## 2020-08-02 DIAGNOSIS — R918 Other nonspecific abnormal finding of lung field: Secondary | ICD-10-CM | POA: Diagnosis present

## 2020-08-02 DIAGNOSIS — I700 Atherosclerosis of aorta: Secondary | ICD-10-CM

## 2020-08-02 DIAGNOSIS — E7800 Pure hypercholesterolemia, unspecified: Secondary | ICD-10-CM | POA: Insufficient documentation

## 2020-08-02 DIAGNOSIS — I25119 Atherosclerotic heart disease of native coronary artery with unspecified angina pectoris: Secondary | ICD-10-CM

## 2020-08-04 ENCOUNTER — Other Ambulatory Visit: Payer: Self-pay

## 2020-08-04 ENCOUNTER — Other Ambulatory Visit: Payer: Medicare Other | Admitting: Internal Medicine

## 2020-08-04 DIAGNOSIS — R7302 Impaired glucose tolerance (oral): Secondary | ICD-10-CM

## 2020-08-04 DIAGNOSIS — E782 Mixed hyperlipidemia: Secondary | ICD-10-CM

## 2020-08-05 ENCOUNTER — Encounter: Payer: Self-pay | Admitting: Internal Medicine

## 2020-08-05 ENCOUNTER — Ambulatory Visit: Payer: Medicare Other | Admitting: Internal Medicine

## 2020-08-05 ENCOUNTER — Other Ambulatory Visit: Payer: Self-pay

## 2020-08-05 DIAGNOSIS — I25118 Atherosclerotic heart disease of native coronary artery with other forms of angina pectoris: Secondary | ICD-10-CM | POA: Diagnosis not present

## 2020-08-05 DIAGNOSIS — I252 Old myocardial infarction: Secondary | ICD-10-CM | POA: Diagnosis not present

## 2020-08-05 LAB — PTH, INTACT AND CALCIUM
Calcium: 10.2 mg/dL (ref 8.6–10.3)
PTH: 55 pg/mL (ref 16–77)

## 2020-08-05 NOTE — Patient Instructions (Signed)
Serum calcium has been repeated fasting along with parathyroid hormone assay.  These results are pending with further instructions to follow.  Patient is asymptomatic.

## 2020-08-05 NOTE — Progress Notes (Signed)
   Subjective:    Patient ID: Johnny [name], male    DOB: 1950-08-21, 70 y.o.   MRN: [id number]  HPI    Review of Systems     Objective:   Physical Exam        Assessment & Plan:

## 2020-08-05 NOTE — Progress Notes (Signed)
   Subjective:    Patient ID: Johnny [name], male    DOB: 05/23/1950, 70 y.o.   MRN: [id number]  HPI 70 year old Male in today for evaluation of elevated serum calcium.  He was seen at Cardiology recently.  Had hospitalization follow-up later.  He was admitted to the hospital February 4 with chest pain and had a left heart cath by Dr. Claiborne Billings.  MI was ruled out.  Patient had diffuse previously noted multivessel coronary artery disease with mild proximal LAD irregularity, diffuse 80% diagonal stenosis with 40% mid LAD stenosis, distal 60% stenosis and very distal 90% stenosis in the very small caliber distal LAD.  The stent in the proximal to mid circumflex vessel was widely patent.  There was a previously noted diffuse disease in a small first marginal branch and 80% ostial stenosis.  A jailed OM 2 vessel with distal occlusion of the distal branch vessel noted previously.  RCA was noted to be a dominant vessel with 30% proximal 20% mid and 30% diffuse PDA stenosis.  He has had no subsequent chest pain.  Medical therapy was recommended with titrating ranolazine to 1000 mg twice daily and starting low-dose amlodipine because patient is intolerant to nitrates due to causing migraine headaches.  Continued on beta-blocker.  Advised aggressive lipid-lowering therapy.  On return appointment to cardiology, calcium was elevated at 10.4 and had previously been 9 and 9.4 when he was admitted to the hospital.  A year ago calcium was 9.8.  In January 2022 calcium was 10.3.  Patient was advised to come in and have calcium drawn fasting along with parathyroid hormone level.  He did this yesterday but the results are not back.  He feels fine and has no symptoms of hyperparathyroidism.  Reminded him that hyperparathyroidism could be subtle and we would necessarily know he had an issue.  Explained to him where the parathyroid glands were and that they controlled calcium metabolism.   Review of Systems     Objective:    Physical Exam Blood pressure 120/80, pulse 86, pulse oximetry 97% weight 200 pounds, height 6 feet 0 inches.  BMI 27.12.  Skin is warm and dry.  No thyromegaly.  No carotid bruits.  Chest is clear to auscultation.  Cardiac exam: Regular rate and rhythm.  Affect, thought and judgment were normal.       Assessment & Plan:  Elevated serum calcium-this could be a lab error.  Fasting serum calcium and PTH are pending and we will notify patient of results and what ever follow-up is appropriate.

## 2020-08-10 ENCOUNTER — Ambulatory Visit: Payer: Medicare Other | Admitting: Physician Assistant

## 2020-08-25 ENCOUNTER — Other Ambulatory Visit: Payer: Self-pay | Admitting: Cardiovascular Disease

## 2020-08-25 DIAGNOSIS — I10 Essential (primary) hypertension: Secondary | ICD-10-CM

## 2020-09-20 ENCOUNTER — Other Ambulatory Visit: Payer: Self-pay | Admitting: Cardiology

## 2020-09-25 ENCOUNTER — Other Ambulatory Visit: Payer: Self-pay | Admitting: Cardiology

## 2021-02-02 ENCOUNTER — Other Ambulatory Visit: Payer: Self-pay | Admitting: Cardiovascular Disease

## 2021-02-02 ENCOUNTER — Other Ambulatory Visit: Payer: Self-pay

## 2021-02-02 ENCOUNTER — Other Ambulatory Visit: Payer: Self-pay | Admitting: Cardiology

## 2021-02-02 DIAGNOSIS — I10 Essential (primary) hypertension: Secondary | ICD-10-CM

## 2021-02-02 MED ORDER — RANOLAZINE ER 500 MG PO TB12: 500.0000 mg | ORAL_TABLET | Freq: Two times a day (BID) | ORAL | 1 refills | Status: DC

## 2021-04-05 ENCOUNTER — Other Ambulatory Visit: Payer: Self-pay | Admitting: Cardiovascular Disease

## 2021-04-05 DIAGNOSIS — G720 Drug-induced myopathy: Secondary | ICD-10-CM | POA: Insufficient documentation

## 2021-05-31 ENCOUNTER — Telehealth: Payer: Self-pay | Admitting: *Deleted

## 2021-05-31 NOTE — Telephone Encounter (Signed)
° °  Pre-operative Risk Assessment    Patient Name: Johnny [name]  DOB: 10/15/1950 MRN: [id number]    I tried to reach out to requesting office to confirm procedure, though was on hold + 3 minutes with no answer. I will fax these notes to requesting office to please clarify procedure as procedure was not circled on clearance request.  Request for Surgical Clearance    Procedure:  CALLED REQUESTING OFFICE TO CONFIRM PROCEDURE  Date of Surgery:  Clearance 09/12/21                                 Surgeon:  DR. Michail Sermon Surgeon's Group or Practice Name:  [facility] Phone number:  [phone] Fax number:  [phone]   Type of Clearance Requested:   - Medical  - Pharmacy:  Hold Clopidogrel (Plavix)     Type of Anesthesia:   PROPOFOL   Additional requests/questions:    Jiles Prows   05/31/2021, 3:32 PM

## 2021-06-01 NOTE — Telephone Encounter (Signed)
Primary Cardiologist:Peter [name], MD  Chart reviewed as part of pre-operative protocol coverage. Because of Johnny [name]'s past medical history and time since last visit, he/she will require a follow-up visit in order to better assess preoperative cardiovascular risk.  Pre-op covering staff: - Please schedule appointment and call patient to inform them. - Please contact requesting surgeon's office via preferred method (i.e, phone, fax) to inform them of need for appointment prior to surgery.  If applicable, this message will also be routed to pharmacy pool and/or primary cardiologist for input on holding anticoagulant/antiplatelet agent as requested below so that this information is available at time of patient's appointment.   Emmaline Life, NP-C    06/01/2021, 11:32 AM [place] [address], [address] Office [phone] Fax [phone]

## 2021-06-01 NOTE — Telephone Encounter (Signed)
Johnny [name] with [facility] is returning call.

## 2021-06-01 NOTE — Telephone Encounter (Signed)
Tried to call [facility] back though no one picked up after being on hold for a number of minutes.   I then did decide to call the pt. I s/w his wife (DPR). Pt wife states to me the pt is set for both colonoscopy and endoscopy 09/12/21. I apologized for having to bother them. I will update the pre op provider we have procedure now.   ADDENDUM TO CLEARANCE REQUEST: PROCEDURE: COLONOSCOPY/ENDOSCOPY

## 2021-06-01 NOTE — Telephone Encounter (Signed)
I s/w the pt's wife DPR. Pt has been scheduled to see Ermalinda Barrios, [facility] 07/18/21 @ 12:15, same day Dr. [name] is in the office. Pt's wife states procedure may get bumped sooner if the pt got in to see cardiology sooner than 08/2021. I will forward notes to [facility] for upcoming appt 07/18/21. Will send FYI to requesting office pt has appt 07/18/21.

## 2021-06-02 ENCOUNTER — Emergency Department (HOSPITAL_BASED_OUTPATIENT_CLINIC_OR_DEPARTMENT_OTHER): Payer: Medicare Other

## 2021-06-02 ENCOUNTER — Other Ambulatory Visit: Payer: Self-pay

## 2021-06-02 ENCOUNTER — Encounter (HOSPITAL_BASED_OUTPATIENT_CLINIC_OR_DEPARTMENT_OTHER): Payer: Self-pay

## 2021-06-02 ENCOUNTER — Emergency Department (HOSPITAL_BASED_OUTPATIENT_CLINIC_OR_DEPARTMENT_OTHER)
Admission: EM | Admit: 2021-06-02 | Discharge: 2021-06-02 | Disposition: A | Discharge status: Home / Self Care | Payer: Medicare Other | Attending: Emergency Medicine | Admitting: Emergency Medicine

## 2021-06-02 DIAGNOSIS — Z7982 Long term (current) use of aspirin: Secondary | ICD-10-CM | POA: Insufficient documentation

## 2021-06-02 DIAGNOSIS — M5442 Lumbago with sciatica, left side: Secondary | ICD-10-CM | POA: Diagnosis not present

## 2021-06-02 DIAGNOSIS — M545 Low back pain, unspecified: Secondary | ICD-10-CM | POA: Diagnosis present

## 2021-06-02 NOTE — ED Triage Notes (Signed)
Pt c/o sacral back pain that radiates down L leg to his toes x 2 weeks.

## 2021-06-02 NOTE — Discharge Instructions (Addendum)
Your CT scan did not show any severe narrowing or new fracture.  Follow-up with your spine specialist by calling to make an appointment this week.  You can try over-the-counter lidocaine patches as needed in addition to the medications you are taking.  Return to the ER if you have worsening symptoms new numbness weakness or any additional concerns.

## 2021-06-02 NOTE — ED Provider Notes (Signed)
[facility] Provider Note   CSN: [id number] Arrival date & time: 06/02/21  1453     History  Chief Complaint  Patient presents with   Back Pain    Johnny FAUCETT is a 71 y.o. male.  Patient with history of chronic lower back pain presents with acute exacerbation of pain ongoing for the past 2 weeks.  Describes her pain as a sharp left buttock pain that radiates down to the left leg all the way down to his feet.  Also sometimes radiates into his groin.  Denies any new fall or trauma.  Denies fevers or cough or vomiting or diarrhea.  Denies any new bowel or bladder dysfunction or any new numbness or weakness.  Is been taking Percocet at home which help with the pain but he has had episodes that wake him up in the middle night and he presents to the ER for evaluation.      Home Medications Prior to Admission medications   Medication Sig Start Date End Date Taking? Authorizing Provider  amLODipine (NORVASC) 2.5 MG tablet TAKE 1 TABLET BY MOUTH EVERY DAY 09/20/20   [name], MD  aspirin EC 81 MG EC tablet Take 1 tablet (81 mg total) by mouth daily. 06/24/19   Cheryln Manly, NP  clopidogrel (PLAVIX) 75 MG tablet TAKE 1 TABLET BY MOUTH EVERY DAY 04/05/21   [name], MD  Cyanocobalamin (VITAMIN B-12) 1000 MCG SUBL Place 1 each under the tongue every morning.    [provider]  Magnesium 400 MG TABS Take 1 tablet by mouth daily.    [provider]  nebivolol (BYSTOLIC) 2.5 MG tablet TAKE 1 TABLET BY MOUTH EVERY DAY 02/02/21   [name], MD  nitroGLYCERIN (NITROSTAT) 0.4 MG SL tablet Place 0.4 mg under the tongue every 5 (five) minutes as needed for chest pain.    [provider]  oxyCODONE-acetaminophen (PERCOCET) 10-325 MG tablet Take 1 tablet by mouth every 6 (six) hours as needed for pain.  05/25/19   [provider]  ranolazine (RANEXA) 500 MG 12 hr tablet Take 1 tablet (500 mg total) by mouth 2 (two)  times daily. 02/02/21   [name], MD  rosuvastatin (CRESTOR) 5 MG tablet Take 5 mg by mouth daily.    [provider]  tamsulosin (FLOMAX) 0.4 MG CAPS capsule Take 1 capsule (0.4 mg total) by mouth daily. 06/17/20   [name], MD      Allergies    Atorvastatin, Praluent [alirocumab], Hydrocodone, Levofloxacin, Levofloxacin in d5w, Metadate cd [methylphenidate hcl], Morphine and related, Other, and Pravastatin    Review of Systems   Review of Systems  Constitutional:  Negative for fever.  HENT:  Negative for ear pain and sore throat.   Eyes:  Negative for pain.  Respiratory:  Negative for cough.   Cardiovascular:  Negative for chest pain.  Gastrointestinal:  Negative for abdominal pain.  Genitourinary:  Negative for flank pain.  Musculoskeletal:  Positive for back pain.  Skin:  Negative for color change and rash.  Neurological:  Negative for syncope.  All other systems reviewed and are negative.  Physical Exam Updated Vital Signs BP 137/87 (BP Location: Left Arm)    Pulse 60    Temp 98.3 F (36.8 C) (Oral)    Resp 16    Ht 6' (1.829 m)    Wt 87.5 kg    SpO2 100%    BMI 26.18 kg/m  Physical Exam Constitutional:      Appearance: He is well-developed.  HENT:     Head: Normocephalic.     Nose: Nose normal.  Eyes:     Extraocular Movements: Extraocular movements intact.  Cardiovascular:     Rate and Rhythm: Normal rate.  Pulmonary:     Effort: Pulmonary effort is normal.  Musculoskeletal:     Comments: Mild to moderate left buttock tenderness on exam.  Negative straight leg test bilaterally.  Bilateral lower extremity dorsalis pedis pulses are 2+ with normal cap refill no abnormal temperature on palpation..  Skin:    Coloration: Skin is not jaundiced.  Neurological:     General: No focal deficit present.     Mental Status: He is alert and oriented to person, place, and time. Mental status is at baseline.     Cranial Nerves: No cranial nerve deficit.      Motor: No weakness.     Gait: Gait normal.     Comments: He has a normal gait, walking around the room and sitting up and standing and laying down and transferring without any apparent discomfort or difficulty.  No saddle anesthesia no focal neurodeficit noted.  Strength 5/5 all extremities.    ED Results / Procedures / Treatments   Labs (all labs ordered are listed, but only abnormal results are displayed) Labs Reviewed - No data to display  EKG None  Radiology CT Lumbar Spine Wo Contrast  Result Date: 06/02/2021 CLINICAL DATA:  Low back pain, increased fracture risk EXAM: CT LUMBAR SPINE WITHOUT CONTRAST TECHNIQUE: Multidetector CT imaging of the lumbar spine was performed without intravenous contrast administration. Multiplanar CT image reconstructions were also generated. RADIATION DOSE REDUCTION: This exam was performed according to the departmental dose-optimization program which includes automated exposure control, adjustment of the mA and/or kV according to patient size and/or use of iterative reconstruction technique. COMPARISON:  None. FINDINGS: Segmentation: 5 lumbar type vertebrae. Alignment: Anteroposterior alignment is maintained. Vertebrae: Decreased osseous mineralization. No acute fracture. Postoperative changes at L4-L5 with rods and pedicle screws and interbody spacer. No evidence of hardware complication. Mild loss of height at the superior endplate of L4 eccentric to the right appears chronic. Degenerative plate irregularity at L4-L5 and L5-S1. Paraspinal and other soft tissues: Partially imaged probable right renal calculus. Disc levels: Disc bulges and facet hypertrophy are present. There are endplate osteophytes. Thickening of the ligamentum flavum. The canal is not well evaluated at the L4-L5 level due to streak artifact. No high-grade canal narrowing identified at other levels. Foraminal narrowing is greatest on the right at L5-S1. IMPRESSION: No acute fracture.  Postoperative changes at L4-L5 without evidence of hardware complication. Degenerative changes are present without apparent high-grade canal narrowing. Foraminal narrowing is greatest on the right at L5-S1. Electronically Signed   By: Macy Mis M.D.   On: 06/02/2021 18:23    Procedures Procedures    Medications Ordered in ED Medications - No data to display  ED Course/ Medical Decision Making/ A&P                           Medical Decision Making  Family requesting imaging here in the ER which was done.  No acute findings on imaging.  Patient continues able to walk across his room stand up and sit down without discomfort.  Given findings today I doubt severe spinal stenosis or cauda equina.  Recommending outpatient follow-up with his spine specialist within the week.  Advising immediate return if he has new numbness weakness worsening symptoms or any additional concerns.       Final Clinical Impression(s) / ED Diagnoses Final diagnoses:  Left-sided low back pain with left-sided sciatica, unspecified chronicity    Rx / DC Orders ED Discharge Orders     None         Luna Fuse, MD 06/02/21 [phone]

## 2021-06-05 ENCOUNTER — Other Ambulatory Visit: Payer: Self-pay

## 2021-06-05 ENCOUNTER — Encounter (HOSPITAL_BASED_OUTPATIENT_CLINIC_OR_DEPARTMENT_OTHER): Payer: Self-pay

## 2021-06-05 ENCOUNTER — Emergency Department (HOSPITAL_BASED_OUTPATIENT_CLINIC_OR_DEPARTMENT_OTHER)
Admission: EM | Admit: 2021-06-05 | Discharge: 2021-06-05 | Disposition: A | Discharge status: Home / Self Care | Payer: Medicare Other | Attending: Emergency Medicine | Admitting: Emergency Medicine

## 2021-06-05 DIAGNOSIS — Z7982 Long term (current) use of aspirin: Secondary | ICD-10-CM | POA: Diagnosis not present

## 2021-06-05 DIAGNOSIS — Z79899 Other long term (current) drug therapy: Secondary | ICD-10-CM | POA: Diagnosis not present

## 2021-06-05 DIAGNOSIS — Z7902 Long term (current) use of antithrombotics/antiplatelets: Secondary | ICD-10-CM | POA: Insufficient documentation

## 2021-06-05 DIAGNOSIS — M5442 Lumbago with sciatica, left side: Secondary | ICD-10-CM

## 2021-06-05 DIAGNOSIS — M549 Dorsalgia, unspecified: Secondary | ICD-10-CM | POA: Diagnosis present

## 2021-06-05 MED ORDER — HYDROMORPHONE HCL 1 MG/ML IJ SOLN
1.0000 mg | Freq: Once | INTRAMUSCULAR | Status: AC
Start: 2021-06-05 — End: 2021-06-05
  Administered 2021-06-05: 1 mg via INTRAMUSCULAR
  Filled 2021-06-05: qty 1

## 2021-06-05 NOTE — ED Provider Notes (Signed)
[facility] DEPT Provider Note   CSN: [id number] Arrival date & time: 06/05/21  1605     History  Chief Complaint  Patient presents with   Back Pain    Johnny [name] is a 71 y.o. male.  Patient presents back to the ER for persistent left-sided back pain rating down the left leg.  He states that it has not gotten any better and it wakes him up in the middle night.  He is try to call his pain specialist as well his spine specialist but he has not received any phone calls back.  However today's Monday is a holiday and they were unlikely to call him back in the last 3 days since they were a holiday weekend.  Patient otherwise denies any new numbness or weakness denies any new fall or trauma denies any fevers or cough or vomiting or diarrhea, denies any bowel or bladder dysfunction.  He is ambulatory without assistance at baseline.      Home Medications Prior to Admission medications   Medication Sig Start Date End Date Taking? Authorizing Provider  amLODipine (NORVASC) 2.5 MG tablet TAKE 1 TABLET BY MOUTH EVERY DAY 09/20/20   [name], MD  aspirin EC 81 MG EC tablet Take 1 tablet (81 mg total) by mouth daily. 06/24/19   Cheryln Manly, NP  clopidogrel (PLAVIX) 75 MG tablet TAKE 1 TABLET BY MOUTH EVERY DAY 04/05/21   [name], MD  Cyanocobalamin (VITAMIN B-12) 1000 MCG SUBL Place 1 each under the tongue every morning.    [provider]  Magnesium 400 MG TABS Take 1 tablet by mouth daily.    [provider]  nebivolol (BYSTOLIC) 2.5 MG tablet TAKE 1 TABLET BY MOUTH EVERY DAY 02/02/21   [name], MD  nitroGLYCERIN (NITROSTAT) 0.4 MG SL tablet Place 0.4 mg under the tongue every 5 (five) minutes as needed for chest pain.    [provider]  oxyCODONE-acetaminophen (PERCOCET) 10-325 MG tablet Take 1 tablet by mouth every 6 (six) hours as needed for pain.  05/25/19   [provider]  ranolazine (RANEXA) 500 MG 12  hr tablet Take 1 tablet (500 mg total) by mouth 2 (two) times daily. 02/02/21   [name], MD  rosuvastatin (CRESTOR) 5 MG tablet Take 5 mg by mouth daily.    [provider]  tamsulosin (FLOMAX) 0.4 MG CAPS capsule Take 1 capsule (0.4 mg total) by mouth daily. 06/17/20   [name], MD      Allergies    Atorvastatin, Praluent [alirocumab], Hydrocodone, Levofloxacin, Levofloxacin in d5w, Metadate cd [methylphenidate hcl], Morphine and related, Other, and Pravastatin    Review of Systems   Review of Systems  Constitutional:  Negative for fever.  HENT:  Negative for ear pain and sore throat.   Eyes:  Negative for pain.  Respiratory:  Negative for cough.   Cardiovascular:  Negative for chest pain.  Gastrointestinal:  Negative for abdominal pain.  Genitourinary:  Negative for flank pain.  Musculoskeletal:  Positive for back pain.  Skin:  Negative for color change and rash.  Neurological:  Negative for syncope.  All other systems reviewed and are negative.  Physical Exam Updated Vital Signs BP (!) 146/89 (BP Location: Right Arm)    Pulse 63    Temp 97.9 F (36.6 C)    Resp 18    Ht 6' (1.829 m)    Wt 87.5 kg    SpO2  99%    BMI 26.16 kg/m  Physical Exam Constitutional:      Appearance: He is well-developed.  HENT:     Head: Normocephalic.     Nose: Nose normal.  Eyes:     Extraocular Movements: Extraocular movements intact.  Cardiovascular:     Rate and Rhythm: Normal rate.  Pulmonary:     Effort: Pulmonary effort is normal.  Skin:    Coloration: Skin is not jaundiced.  Neurological:     General: No focal deficit present.     Mental Status: He is alert and oriented to person, place, and time. Mental status is at baseline.     Cranial Nerves: No cranial nerve deficit.     Motor: No weakness.     Gait: Gait normal.    ED Results / Procedures / Treatments   Labs (all labs ordered are listed, but only abnormal results are displayed) Labs Reviewed - No data  to display  EKG None  Radiology No results found.  Procedures Procedures    Medications Ordered in ED Medications  HYDROmorphone (DILAUDID) injection 1 mg (1 mg Intramuscular Given 06/05/21 1730)    ED Course/ Medical Decision Making/ A&P                           Medical Decision Making Amount and/or Complexity of Data Reviewed Independent Historian: spouse External Data Reviewed:     Details: Prior ER visit reviewed prior CT imaging reviewed.  Risk Risk Details: I considered possible differentials including peripheral artery disease, cauda equina, severe canal stenosis, stroke.  However patient has no focal neurodeficit.  Pulses are bilaterally equal 2+ popliteal as well as posterior tibial.  No abnormal warmth of either extremity noted.  Patient is amatory without any assistance transfers from sitting in the bed to laying in the bed to standing up without any assistance and without delay.   Patient given Dilaudid IM here in the ER.  Advised him to follow-up with his pain specialist as they are likely to call him tomorrow which is a normal working day and today is a holiday.  He has no focal neurodeficit no emergent back pain symptoms.  Advising immediate return if he develops numbness weakness fevers worsening symptoms or any additional concerns.  Advised him to continue calling his pain specialist and primary care doctor as needed.        Final Clinical Impression(s) / ED Diagnoses Final diagnoses:  Left-sided low back pain with left-sided sciatica, unspecified chronicity    Rx / DC Orders ED Discharge Orders     None         Luna Fuse, MD 06/05/21 [phone]

## 2021-06-05 NOTE — ED Triage Notes (Signed)
Patient here POV from Home with Back Pain.  Patient states he was here on 06/02/21 for Back Pain. Pain has continued since and PCP has not been responding today regarding Management of Symptoms. Pain is Lower Back and radiates to Left Foot.  NAD Noted during Triage. A&Ox4. GCS 15. BIB Wheelchair.

## 2021-06-05 NOTE — Discharge Instructions (Addendum)
Call your primary care doctor or specialist as discussed in the next 2-3 days.   Return immediately back to the ER if:  Your symptoms worsen within the next 12-24 hours. You develop new symptoms such as new fevers, persistent vomiting, new pain, shortness of breath, or new weakness or numbness, or if you have any other concerns.  

## 2021-06-06 ENCOUNTER — Telehealth: Payer: Self-pay

## 2021-06-06 NOTE — Telephone Encounter (Signed)
Spoke with patients wife. She states that he sees [facility] Dr Hardin Negus. They have left several messages with them to ask what to do. He is using his percocet, epsom salt bath, lidocaine, heating pads, and massage with no relief. They are also waiting on an appt with spine doctor since r roy retired. Any suggestions?

## 2021-07-07 ENCOUNTER — Other Ambulatory Visit: Payer: Self-pay

## 2021-07-07 ENCOUNTER — Other Ambulatory Visit: Payer: Medicare Other | Admitting: Internal Medicine

## 2021-07-07 DIAGNOSIS — I10 Essential (primary) hypertension: Secondary | ICD-10-CM

## 2021-07-07 DIAGNOSIS — E782 Mixed hyperlipidemia: Secondary | ICD-10-CM

## 2021-07-07 DIAGNOSIS — Z125 Encounter for screening for malignant neoplasm of prostate: Secondary | ICD-10-CM

## 2021-07-10 ENCOUNTER — Encounter: Payer: Self-pay | Admitting: Internal Medicine

## 2021-07-10 ENCOUNTER — Ambulatory Visit (INDEPENDENT_AMBULATORY_CARE_PROVIDER_SITE_OTHER): Payer: Medicare Other | Admitting: Internal Medicine

## 2021-07-10 ENCOUNTER — Other Ambulatory Visit: Payer: Self-pay

## 2021-07-10 VITALS — BP 132/78 | HR 71 | Temp 98.5°F | Ht 72.0 in | Wt 194.0 lb

## 2021-07-10 DIAGNOSIS — Z0000 Encounter for general adult medical examination without abnormal findings: Secondary | ICD-10-CM

## 2021-07-10 DIAGNOSIS — R351 Nocturia: Secondary | ICD-10-CM

## 2021-07-10 DIAGNOSIS — Z86010 Personal history of colonic polyps: Secondary | ICD-10-CM

## 2021-07-10 DIAGNOSIS — N401 Enlarged prostate with lower urinary tract symptoms: Secondary | ICD-10-CM

## 2021-07-10 DIAGNOSIS — R8290 Unspecified abnormal findings in urine: Secondary | ICD-10-CM

## 2021-07-10 DIAGNOSIS — I10 Essential (primary) hypertension: Secondary | ICD-10-CM

## 2021-07-10 DIAGNOSIS — R319 Hematuria, unspecified: Secondary | ICD-10-CM

## 2021-07-10 DIAGNOSIS — Z87442 Personal history of urinary calculi: Secondary | ICD-10-CM

## 2021-07-10 DIAGNOSIS — I25118 Atherosclerotic heart disease of native coronary artery with other forms of angina pectoris: Secondary | ICD-10-CM | POA: Diagnosis not present

## 2021-07-10 DIAGNOSIS — K2100 Gastro-esophageal reflux disease with esophagitis, without bleeding: Secondary | ICD-10-CM

## 2021-07-10 DIAGNOSIS — I252 Old myocardial infarction: Secondary | ICD-10-CM

## 2021-07-10 DIAGNOSIS — E782 Mixed hyperlipidemia: Secondary | ICD-10-CM

## 2021-07-10 LAB — POCT URINALYSIS DIPSTICK
Bilirubin, UA: NEGATIVE
Glucose, UA: NEGATIVE
Ketones, UA: NEGATIVE
Leukocytes, UA: NEGATIVE
Nitrite, UA: NEGATIVE
Protein, UA: NEGATIVE
Spec Grav, UA: 1.015 (ref 1.010–1.025)
Urobilinogen, UA: 0.2 E.U./dL
pH, UA: 6.5 (ref 5.0–8.0)

## 2021-07-10 NOTE — Progress Notes (Signed)
Annual Wellness Visit     Patient: Johnny [name], Male    DOB: 1950-09-09, 71 y.o.   MRN: [id number] Visit Date: 07/10/2021  Chief Complaint  Patient presents with   Medicare Wellness   Subjective    Johnny [name] is a 71 y.o. male who presents today for his Annual Wellness Visit.  HPI He also presents for health maintenance exam and evaluation of medical issues.  History of adenomatous polyps.  He says he has been in contact with [facility] regarding repeat colonoscopy.  Declines pneumococcal 20, flu vaccine and COVID vaccines.  Tetanus immunization is due in October.  However Medicare will not pay for it unless he injures himself.  He has a history of osteopenia, hypertension, hyperlipidemia, internal hemorrhoids, low testosterone, GE reflux, vitamin D deficiency and prediabetes.  Currently he is on Plavix, Ranexa, Bystolic, Norvasc 2.5 mg daily, low-dose Crestor 5 mg daily but apparently quit taking that recently.  He is on chronic pain management for musculoskeletal pain for [facility] and is treated with oxycodone APAP 10 mg every 6 hours as needed.  He has history of MI and had DES x2 to left circumflex early February 2021.  Catheterization in February 2022 showed left circumflex stent patent with mild to moderate distal and small branch vessel disease.  Medical therapy recommended.  He is intolerant of nitrates due to headaches.  Was readmitted July 04, 2019 with exertional chest pain.  MI was ruled out.  He was on aspirin and Brilinta at the time as well as beta-blocker, Ranexa and statin.  He is statin intolerant but able to tolerate low-dose rosuvastatin.  He is followed by Cardiology.  He had cardiac catheterization in 2019 showing no change from cardiac cath in 2017 and medical management was recommended..  Was admitted for chest pain at that time.  Was found to have normal left ventricular function and grade 1 diastolic dysfunction.  In 2017 he had  NSTEMI.  He had cath by Dr. Alvester Chou who noted 50% mid LAD, 95% ramus and 90% first diagonal stenoses.  He was placed on medical management including Crestor, beta-blocker and Imdur.  History of mixed hyperlipidemia.  History of recurrent kidney stones-episode in 2017  History of 5 mm right middle lobe nodule on chest CT discovered during renal stone study in 2016.  He was referred to pulmonologist.  He had chest CT with contrast ordered by Truitt Merle, nurse practitioner in February 2021 showing small bilateral pulmonary nodules 4 mm.  Left lower lobe nodule was stable.  Right middle lobe nodule was difficult to see on prior study and repeat study was recommended in 1 year.  He had repeat study in March 2022 with pulmonary nodules in left lower lobe and right middle lobe noted to be unchanged and thought to be benign.  Had episode of low back pain in mid January 2023 and was seen at [facility].  No acute vertebral fracture noted.  Postoperative changes at L4-L5 without evidence of hardware complication.  No apparent high-grade canal narrowing.  Foraminal narrowing greatest on the right at L5-S1.  He was treated conservatively.  He had upper endoscopy by Dr. 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  History of GE reflux and hiatal hernia.  He smoked in the remote past but quit around 1979.  He had anal warts removed in 2005.  Lumbar surgery Dr. 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  His son was involved in a serious motor vehicle accident which took over a year to recover from.  His wife has multiple sclerosis and is disabled.  He is a retired Agricultural consultant.  Currently does not smoke.  Very occasional alcohol consumption.  2 other adult children.  History of 5 mm right UVJ stone requiring emergency department visit January 2021.  Was treated conservatively and prescribed  Flomax.   Social History   Social History Narrative   Lives at home with his wife.   Right-handed.   2 cups caffeine per day.      Social history: He is married.  He had a son involved in a serious motor vehicle accident which took over a year to recover.  His wife has multiple sclerosis and is disabled.  He is a retired Agricultural consultant.  He does not smoke.  Very occasional alcohol consumption.  2 other adult children.        Patient Care Team: Johnny Showers, Johnny [name] as PCP - General (Internal Medicine) [name], Johnny [name] as PCP - Cardiology (Cardiology) Darlyne Russian, Johnny [name] as Consulting Physician (Family Medicine) Garvin Fila, Johnny [name] as Consulting Physician (Neurology) Michael Boston, Johnny [name] as Consulting Physician (General Surgery) Wilford Corner, Johnny [name] as Consulting Physician (Gastroenterology)  Review of Systems   Objective    Vitals: BP 132/78    Pulse 71    Temp 98.5 F (36.9 C) (Tympanic)    Ht 6' (1.829 m)    Wt 194 lb (88 kg)    SpO2 97%    BMI 26.31 kg/m   Physical Exam Skin: Warm and dry.  No cervical adenopathy.  No thyromegaly.  No carotid bruits.  Chest clear to auscultation.  Cardiac exam regular rate and rhythm without ectopy or murmur.  Abdomen soft nondistended without hepatosplenomegaly masses or tenderness.  Prostate is normal without nodules.  No lower extremity pitting edema.  Brief neurological exam is intact without focal deficits.  Affect thought and judgment are normal.  Most recent functional status assessment: In your present state of health, do you have any difficulty performing the following activities: 07/10/2021  Hearing? N  Vision? N  Difficulty concentrating or making decisions? N  Walking or climbing stairs? N  Dressing or bathing? N  Doing errands, shopping? N  Preparing Food and eating ? N  Using the Toilet? N  In the past six months, have you accidently leaked urine? N  Do you have problems with loss of bowel control? N  Managing your Medications? N   Managing your Finances? N  Housekeeping or managing your Housekeeping? N  Some recent data might be hidden   Most recent fall risk assessment: Fall Risk  07/10/2021  Falls in the past year? 0  Number falls in past yr: 0  Injury with Fall? 0  Risk for fall due to : No Fall Risks  Follow up Falls evaluation completed    Most recent depression screenings: PHQ 2/9 Scores 07/10/2021 06/17/2020  PHQ - 2 Score 0 0  PHQ- 9 Score - -   Most recent cognitive screening: 6CIT Screen 07/10/2021  What Year? 0 points  What month? 0 points  What time? 0 points  Count back from 20 0 points  Months in reverse 4 points  Repeat phrase 0 points  Total Score 4       Assessment & Plan  Coronary artery disease followed closely by cardiology.  He is on Plavix, Ranexa, Bystolic, amlodipine, low-dose rosuvastatin and low-dose aspirin, nitroglycerin.  History of smoking-quit around 1979  Pulmonary nodules that have been followed by a chest CT and thought to be benign  History of mixed hyperlipidemia but does not tolerate high-dose statin.  Currently on low-dose statin.  Currently total cholesterol is 225 and triglycerides are 271.  LDL cholesterol 140.  Previously had better numbers in January 2022.  Wondering if he is noncompliant with Crestor.  History of GE reflux followed by Dr. Michail Sermon  Due for screening colonoscopy with Dr. Nelwyn Salisbury was encouraged to follow-up with this.  History of recurrent kidney stones  BPH treated with Flomax  History of impaired glucose tolerance  History of low back pain-seen by [facility] and is on oxycodone 10/325 every 6 hours as needed for pain  History of hypertension treated by cardiologist  Plan: Labs are reviewed today and he has an elevated serum calcium.  In January 2022 calcium was 10.3 but by February 2022 had decreased to 9.4.  Intact PTH will be drawn.  He may have hyperparathyroidism.  Further recommendations will be provided.  He  will have repeat serum calcium when he is well-hydrated.  He also has earwax.  He can call ENT for evaluation or go to urgent care.     Annual wellness visit done today including the all of the following: Reviewed patient's Family Medical History Reviewed and updated list of patient's medical providers Assessment of cognitive impairment was done Assessed patient's functional ability Established a written schedule for health screening [place] Completed and Reviewed  Discussed health benefits of physical activity, and encouraged him to engage in regular exercise appropriate for his age and condition.         {I, Johnny Showers, Johnny [name], have reviewed all documentation for this visit. The documentation on 07/17/21 for the exam, diagnosis, procedures, and orders are all accurate and compete.   Angus Seller, CMA

## 2021-07-10 NOTE — Progress Notes (Signed)
Cardiology Office Note    Date:  07/18/2021   ID:  Johnny [name], DOB 12-28-1950, MRN [id number]   PCP:  [name], MD   [place]  Cardiologist:  [name], MD   Advanced Practice Provider:  No care team member to display Electrophysiologist:  None   [phone]   Chief Complaint  Patient presents with   Pre-op Exam    History of Present Illness:  Johnny [name] is a 71 y.o. male with history of CAD status post remote MI, DES to the circumflex 06/2019, most recent cath 06/2020 patent circumflex but moderate to severe distal and small branch vessel disease.  Medical therapy recommended.  Could not tolerate higher dose ranolazine, hypertension, hyperlipidemia, history of TIAs, history of pulmonary nodules.  Patient last saw Richardson Dopp 07/26/2020 and was stable.  LDL was above goal and he wanted to look into some other treatments before referral to lipid clinic.  Patient is on my schedule for preop clearance for endoscopy and colonoscopy by Dr. Michail Sermon with [facility] April 25. Needs MRI for back by Dr. Kathyrn Sheriff and need to know if it's ok. Denies chest pain, dyspnea, palpitations. Patient stopped amlodipine last year because of fatigue and low BP. LDL 140, trig 271, TC 225 07/07/21. Stopped crestor because of muscle aches. Was doing much better last year but not eating properly.     Past Medical History:  Diagnosis Date   Adenomatous polyps    Bronchitis    CAD in native artery 2017   Chronic back pain    GERD (gastroesophageal reflux disease)    Hyperlipidemia    Hypertension    Internal hemorrhoids    Low testosterone    Myocardial infarction ([place])    Osteopenia    Shortness of breath dyspnea    Spondylosis    TIA (transient ischemic attack) 2008   Ureterolithiasis    Ventral hernia    Vitamin D deficiency     Past Surgical History:  Procedure Laterality Date   BACK SURGERY     2 surgeries   CARDIAC CATHETERIZATION N/A  09/12/2015   Procedure: Left Heart Cath and Coronary Angiography;  Surgeon: Lorretta Harp, MD;  Location: [place] [facility] [facility];  Service: Cardiovascular;  Laterality: N/A;   CORONARY STENT INTERVENTION N/A 06/23/2019   Procedure: CORONARY STENT INTERVENTION;  Surgeon: Sherren Mocha, MD;  Location: [place] [facility];  Service: Cardiovascular;  Laterality: N/A;   LEFT HEART CATH AND CORONARY ANGIOGRAPHY N/A 01/27/2018   Procedure: LEFT HEART CATH AND CORONARY ANGIOGRAPHY;  Surgeon: Jettie Booze, MD;  Location: [place] [facility];  Service: Cardiovascular;  Laterality: N/A;   LEFT HEART CATH AND CORONARY ANGIOGRAPHY N/A 06/23/2019   Procedure: LEFT HEART CATH AND CORONARY ANGIOGRAPHY;  Surgeon: Sherren Mocha, MD;  Location: [place] [facility];  Service: Cardiovascular;  Laterality: N/A;   LEFT HEART CATH AND CORONARY ANGIOGRAPHY N/A 06/24/2020   Procedure: LEFT HEART CATH AND CORONARY ANGIOGRAPHY;  Surgeon: Troy Sine, MD;  Location: [place] [facility];  Service: Cardiovascular;  Laterality: N/A;    Current Medications: Current Meds  Medication Sig   clopidogrel (PLAVIX) 75 MG tablet TAKE 1 TABLET BY MOUTH EVERY DAY   Cyanocobalamin (VITAMIN B-12) 1000 MCG SUBL Place 1 each under the tongue every morning.   nebivolol (BYSTOLIC) 2.5 MG tablet TAKE 1 TABLET BY MOUTH EVERY DAY   nitroGLYCERIN (NITROSTAT) 0.4 MG SL tablet Place 0.4 mg under  the tongue every 5 (five) minutes as needed for chest pain.   oxyCODONE-acetaminophen (PERCOCET) 10-325 MG tablet Take 1 tablet by mouth every 6 (six) hours as needed for pain.    ranolazine (RANEXA) 500 MG 12 hr tablet Take 1 tablet (500 mg total) by mouth 2 (two) times daily.   tamsulosin (FLOMAX) 0.4 MG CAPS capsule Take 1 capsule (0.4 mg total) by mouth daily.   [DISCONTINUED] amLODipine (NORVASC) 2.5 MG tablet TAKE 1 TABLET BY MOUTH EVERY DAY     Allergies:   Atorvastatin, Praluent [alirocumab], Isosorbide nitrate, Hydrocodone, Levofloxacin,  Levofloxacin in d5w, Metadate cd [methylphenidate hcl], Morphine and related, Other, and Pravastatin   Social History   Socioeconomic History   Marital status: Married    Spouse name: Not on file   Number of children: 4   Years of education: 14   Highest education level: Not on file  Occupational History   Occupation: Geophysical data processor  Tobacco Use   Smoking status: Former    Years: 10.00    Types: Cigarettes    Quit date: 01/04/1978    Years since quitting: 43.5   Smokeless tobacco: Never  Vaping Use   Vaping Use: Never used  Substance and Sexual Activity   Alcohol use: No    Alcohol/week: 0.0 standard drinks    Comment: rarely   Drug use: No   Sexual activity: Not on file  Other Topics Concern   Not on file  Social History Narrative   Lives at home with his wife.   Right-handed.   2 cups caffeine per day.      Social history: He is married.  He had a son involved in a serious motor vehicle accident which took over a year to recover.  His wife has multiple sclerosis and is disabled.  He is a retired Agricultural consultant.  He does not smoke.  Very occasional alcohol consumption.  2 other adult children.       Social Determinants of Health   Financial Resource Strain: Not on file  Food Insecurity: Not on file  Transportation Needs: Not on file  Physical Activity: Not on file  Stress: Not on file  Social Connections: Not on file     Family History:  The patient's  family history includes Emphysema in his father; Heart attack in his mother; Heart disease in his mother; Stroke in his mother.   ROS:   Please see the history of present illness.    ROS All other systems reviewed and are negative.   PHYSICAL EXAM:   VS:  BP 130/76 (BP Location: Left Arm, Patient Position: Sitting, Cuff Size: Normal)    Pulse 60    Ht 6' (1.829 m)    Wt 191 lb 12.8 oz (87 kg)    BMI 26.01 kg/m   Physical Exam  GEN: Well nourished, well developed, in no acute distress  Neck: no JVD, carotid bruits,  or masses Cardiac:RRR; no murmurs, rubs, or gallops  Respiratory:  clear to auscultation bilaterally, normal work of breathing GI: soft, nontender, nondistended, + BS Ext: without cyanosis, clubbing, or edema, Good distal pulses bilaterally Neuro:  Alert and Oriented x 3, Strength and sensation are intact Psych: euthymic mood, full affect  Wt Readings from Last 3 Encounters:  07/18/21 191 lb 12.8 oz (87 kg)  07/10/21 194 lb (88 kg)  06/05/21 192 lb 14.4 oz (87.5 kg)      Studies/Labs Reviewed:   EKG:  EKG is  ordered today.  The  ekg ordered today demonstrates NSR  Recent Labs: 07/07/2021: ALT 15; BUN 13; Creat 1.13; Hemoglobin 14.9; Platelets 197; Potassium 5.0; Sodium 139   Lipid Panel    Component Value Date/Time   CHOL 225 (H) 07/07/2021 0921   CHOL 135 01/19/2020 0808   TRIG 271 (H) 07/07/2021 0921   HDL 43 07/07/2021 0921   HDL 44 01/19/2020 0808   CHOLHDL 5.2 (H) 07/07/2021 0921   VLDL 43 (H) 01/26/2018 0457   LDLCALC 140 (H) 07/07/2021 0921    Additional studies/ records that were reviewed today include:  Cardiac catheterization 06/24/20 LAD ost 20, mid 40, dist 60/90; D1 80 (small) LCx stent patent; OM1 80 (small); Lat OM2 100 RCA prox 30/20; RPDA 30 EF 55-65     Echocardiogram 06/24/20 EF 60-65, no RWMA, Gr 1 DD, mild dilation at level of SoV (39 mm), ascending aorta (38 mm)   Chest CT 07/08/19 Small bilateral pulmonary nodules, 50mm (repeat CT 06/2020) Old granulomatous disease CAD Aortic atherosclerosisi Emphysema   Carotid US 07/02/14 Bilat ICA 1-39         Risk Assessment/Calculations:         ASSESSMENT:    1. Preoperative clearance   2. Coronary artery disease involving native coronary artery of native heart without angina pectoris   3. Essential hypertension   4. Hyperlipidemia, unspecified hyperlipidemia type   5. TIA (transient ischemic attack)      PLAN:  In order of problems listed above:  Preop clearance for endoscopy and  colonoscopy 09/12/21 by Dr. Michail Sermon.  He will need to hold Plavix-patient doing well without angina. With  known CAD but METS over 4. Can proceed with endoscopy and colonoscopy without further ischemic testing.  Dr. [name] clarified that he can hold Plavix 5-7 days prior to procedure. Can also have MRI per Dr. Kathyrn Sheriff.  According to the Revised Cardiac Risk Index (RCRI), his Perioperative Risk of Major Cardiac Event is (%): 6.6  His Functional Capacity in METs is: 5.62 according to the Duke Activity Status Index (DASI).   CAD status post DES x2 to the circumflex 06/2019, cath 06/2020 circumflex stent patent, moderate to severe distal and small branch vessel disease medical therapy. Patient stopped amlodipine last year due to low BP's and fatigue. Will remain off.  Hypertension BP controlled off amlodipine  Hyperlipidemia statin intolerant no longer tolerating low dose rosuvastatin-LDL 149 trig 271 2/1/.23-refer back to lipid clinic-he's had trouble with PCSK9i in past also  History of TIA on ASA and Plavix      Shared Decision Making/Informed Consent        Medication Adjustments/Labs and Tests Ordered: Current medicines are reviewed at length with the patient today.  Concerns regarding medicines are outlined above.  Medication changes, Labs and Tests ordered today are listed in the Patient Instructions below. Patient Instructions  Medication Instructions:  Your physician recommends that you continue on your current medications as directed. Please refer to the Current Medication list given to you today.  *If you need a refill on your cardiac medications before your next appointment, please call your pharmacy*   Lab Work: None ordered  If you have labs (blood work) drawn today and your tests are completely normal, you will receive your results only by: [place] (if you have MyChart) OR A paper copy in the mail If you have any lab test that is abnormal or we need to change  your treatment, we will call you to review the results.   Testing/Procedures: None  ordered  Your physician recommends that you schedule a follow-up appointment in: [place].  (Has seen them before)    Follow-Up: At [facility], you and your health needs are our priority.  As part of our continuing mission to provide you with exceptional heart care, we have created designated Provider Care Teams.  These Care Teams include your primary Cardiologist (physician) and Advanced Practice Providers (APPs -  Physician Assistants and Nurse Practitioners) who all work together to provide you with the care you need, when you need it.  We recommend signing up for the patient portal called "MyChart".  Sign up information is provided on this After Visit Summary.  MyChart is used to connect with patients for Virtual Visits (Telemedicine).  Patients are able to view lab/test results, encounter notes, upcoming appointments, etc.  Non-urgent messages can be sent to your provider as well.   To learn more about what you can do with MyChart, go to [email or web address].    Your next appointment:   12 month(s)  The format for your next appointment:   In Person  Provider:   [name], MD    Other Instructions    Signed, Ermalinda Barrios, PA-C  07/18/2021 2:33 PM    [address], [place], [place]  [address] Phone: [phone]; Fax: [phone]

## 2021-07-10 NOTE — Patient Instructions (Addendum)
Labs reviewed with patient. Needs ear wax removal. He can call ENT or go to urgent care.  Needs repeat colonoscopy and he has been in contact with [facility]. RTC  here in one year or as needed. Declines vaccines.  Addendum: Needs follow-up on elevated serum calcium.  Has repeat calcium and intact PTH which will need follow-up.

## 2021-07-10 NOTE — Addendum Note (Signed)
Addended by: Angus Seller on: 07/10/2021 09:24 AM   Modules accepted: Orders

## 2021-07-11 LAB — PSA: PSA: 0.69 ng/mL (ref <=4.00)

## 2021-07-11 LAB — CBC WITH DIFFERENTIAL/PLATELET
Absolute Monocytes: 531 cells/uL (ref 200–950)
Basophils Absolute: 31 cells/uL (ref 0–200)
Basophils Relative: 0.5 %
Eosinophils Absolute: 110 cells/uL (ref 15–500)
Eosinophils Relative: 1.8 %
HCT: 44 % (ref 38.5–50.0)
Hemoglobin: 14.9 g/dL (ref 13.2–17.1)
Lymphs Abs: 1830 cells/uL (ref 850–3900)
MCH: 33.8 pg — ABNORMAL HIGH (ref 27.0–33.0)
MCHC: 33.9 g/dL (ref 32.0–36.0)
MCV: 99.8 fL (ref 80.0–100.0)
MPV: 9.8 fL (ref 7.5–12.5)
Monocytes Relative: 8.7 %
Neutro Abs: 3599 cells/uL (ref 1500–7800)
Neutrophils Relative %: 59 %
Platelets: 197 10*3/uL (ref 140–400)
RBC: 4.41 10*6/uL (ref 4.20–5.80)
RDW: 12.5 % (ref 11.0–15.0)
Total Lymphocyte: 30 %
WBC: 6.1 10*3/uL (ref 3.8–10.8)

## 2021-07-11 LAB — URINALYSIS W MICROSCOPIC + REFLEX CULTURE
Bacteria, UA: NONE SEEN /HPF
Bilirubin Urine: NEGATIVE
Glucose, UA: NEGATIVE
Hgb urine dipstick: NEGATIVE
Hyaline Cast: NONE SEEN /LPF
Ketones, ur: NEGATIVE
Leukocyte Esterase: NEGATIVE
Nitrites, Initial: NEGATIVE
Protein, ur: NEGATIVE
RBC / HPF: NONE SEEN /HPF (ref 0–2)
Specific Gravity, Urine: 1.011 (ref 1.001–1.035)
Squamous Epithelial / HPF: NONE SEEN /HPF (ref <=5)
WBC, UA: NONE SEEN /HPF (ref 0–5)
pH: 6.5 (ref 5.0–8.0)

## 2021-07-11 LAB — NO CULTURE INDICATED

## 2021-07-11 LAB — LIPID PANEL
Cholesterol: 225 mg/dL — ABNORMAL HIGH (ref <200)
HDL: 43 mg/dL (ref >=40)
LDL Cholesterol (Calc): 140 mg/dL (calc) — ABNORMAL HIGH
Non-HDL Cholesterol (Calc): 182 mg/dL (calc) — ABNORMAL HIGH (ref <130)
Total CHOL/HDL Ratio: 5.2 (calc) — ABNORMAL HIGH (ref <5.0)
Triglycerides: 271 mg/dL — ABNORMAL HIGH (ref <150)

## 2021-07-11 LAB — COMPLETE METABOLIC PANEL WITH GFR
AG Ratio: 1.5 (calc) (ref 1.0–2.5)
ALT: 15 U/L (ref 9–46)
AST: 14 U/L (ref 10–35)
Albumin: 4.3 g/dL (ref 3.6–5.1)
Alkaline phosphatase (APISO): 41 U/L (ref 35–144)
BUN: 13 mg/dL (ref 7–25)
CO2: 26 mmol/L (ref 20–32)
Calcium: 10.4 mg/dL — ABNORMAL HIGH (ref 8.6–10.3)
Chloride: 104 mmol/L (ref 98–110)
Creat: 1.13 mg/dL (ref 0.70–1.35)
Globulin: 2.8 g/dL (calc) (ref 1.9–3.7)
Glucose, Bld: 89 mg/dL (ref 65–99)
Potassium: 5 mmol/L (ref 3.5–5.3)
Sodium: 139 mmol/L (ref 135–146)
Total Bilirubin: 1.3 mg/dL — ABNORMAL HIGH (ref 0.2–1.2)
Total Protein: 7.1 g/dL (ref 6.1–8.1)
eGFR: 71 mL/min/{1.73_m2} (ref >=60)

## 2021-07-11 LAB — PTH, INTACT AND CALCIUM

## 2021-07-14 ENCOUNTER — Other Ambulatory Visit: Payer: Medicare Other | Admitting: Internal Medicine

## 2021-07-14 ENCOUNTER — Other Ambulatory Visit: Payer: Self-pay

## 2021-07-17 ENCOUNTER — Other Ambulatory Visit: Payer: Self-pay

## 2021-07-17 ENCOUNTER — Other Ambulatory Visit: Payer: Self-pay | Admitting: Internal Medicine

## 2021-07-17 LAB — PTH, INTACT AND CALCIUM
Calcium: 10.8 mg/dL — ABNORMAL HIGH (ref 8.6–10.3)
PTH: 55 pg/mL (ref 16–77)

## 2021-07-17 NOTE — Progress Notes (Signed)
Nm parathyroid w/ spect

## 2021-07-18 ENCOUNTER — Encounter: Payer: Self-pay | Admitting: Physician Assistant

## 2021-07-18 ENCOUNTER — Other Ambulatory Visit: Payer: Self-pay

## 2021-07-18 ENCOUNTER — Ambulatory Visit: Payer: Medicare Other | Admitting: Physician Assistant

## 2021-07-18 VITALS — BP 130/76 | HR 60 | Ht 72.0 in | Wt 191.8 lb

## 2021-07-18 DIAGNOSIS — Z01818 Encounter for other preprocedural examination: Secondary | ICD-10-CM | POA: Diagnosis not present

## 2021-07-18 DIAGNOSIS — T466X5D Adverse effect of antihyperlipidemic and antiarteriosclerotic drugs, subsequent encounter: Secondary | ICD-10-CM

## 2021-07-18 DIAGNOSIS — E785 Hyperlipidemia, unspecified: Secondary | ICD-10-CM

## 2021-07-18 DIAGNOSIS — I10 Essential (primary) hypertension: Secondary | ICD-10-CM | POA: Diagnosis not present

## 2021-07-18 DIAGNOSIS — G720 Drug-induced myopathy: Secondary | ICD-10-CM

## 2021-07-18 DIAGNOSIS — G459 Transient cerebral ischemic attack, unspecified: Secondary | ICD-10-CM

## 2021-07-18 DIAGNOSIS — I2510 Atherosclerotic heart disease of native coronary artery without angina pectoris: Secondary | ICD-10-CM

## 2021-07-18 NOTE — Patient Instructions (Addendum)
Medication Instructions:  Your physician recommends that you continue on your current medications as directed. Please refer to the Current Medication list given to you today.  *If you need a refill on your cardiac medications before your next appointment, please call your pharmacy*   Lab Work: None ordered  If you have labs (blood work) drawn today and your tests are completely normal, you will receive your results only by: [place] (if you have MyChart) OR A paper copy in the mail If you have any lab test that is abnormal or we need to change your treatment, we will call you to review the results.   Testing/Procedures: None ordered  Your physician recommends that you schedule a follow-up appointment in: [place].  (Has seen them before)    Follow-Up: At [facility], you and your health needs are our priority.  As part of our continuing mission to provide you with exceptional heart care, we have created designated Provider Care Teams.  These Care Teams include your primary Cardiologist (physician) and Advanced Practice Providers (APPs -  Physician Assistants and Nurse Practitioners) who all work together to provide you with the care you need, when you need it.  We recommend signing up for the patient portal called "MyChart".  Sign up information is provided on this After Visit Summary.  MyChart is used to connect with patients for Virtual Visits (Telemedicine).  Patients are able to view lab/test results, encounter notes, upcoming appointments, etc.  Non-urgent messages can be sent to your provider as well.   To learn more about what you can do with MyChart, go to [email or web address].    Your next appointment:   12 month(s)  The format for your next appointment:   In Person  Provider:   [name], MD    Other Instructions

## 2021-07-20 ENCOUNTER — Ambulatory Visit: Payer: Medicare Other | Admitting: Internal Medicine

## 2021-07-20 ENCOUNTER — Encounter: Payer: Self-pay | Admitting: Internal Medicine

## 2021-07-20 ENCOUNTER — Other Ambulatory Visit: Payer: Self-pay

## 2021-07-20 ENCOUNTER — Telehealth: Payer: Self-pay | Admitting: Physician Assistant

## 2021-07-20 NOTE — Progress Notes (Signed)
? ?  Subjective:  ? ? Patient ID: Johnny [name], male    DOB: [birth year], 71 y.o.   MRN: [id number] ? ?HPI  71 year old Male seen for follow up.  He was here for annual Medicare wellness visit and health maintenance exam on February 20.  He was found to have an elevated serum calcium of 10.4 he has no symptoms of hypercalcemia.  He was fasting at the time the serum calcium was drawn.  Serum calcium was repeated on February 24 and was 10.8.  PTH was obtained on February 24 and was within normal limits at 55.  I have explained to patient that the neck step would be evaluation for hyperparathyroidism with a nuclear medicine study of the parathyroid.  He is agreeable to this. ? ?Also apparently he has been cleared by Cardiology to have colonoscopy with Dr. Michail Sermon.  We have sent this information to Dr. Michail Sermon at [facility] today. ? ?He has lost his wallet and is anxious about this today. ? ?His wife has multiple sclerosis and she has not been well recently and was hospitalized with a respiratory infection.  He says she has not completely recovered. ? ? ? ?Review of Systems see above-slightly anxious because he has misplaced his wallet ? ?   ?Objective:  ? Physical Exam ?Blood pressure 130/82 pulse 61 temperature 98.5 degrees pulse oximetry 99% ? ? ? ?   ?Assessment & Plan:  ?I took 20 minutes today to explain hyper parathyroidism to him and the need to have a nuclear medicine study for hyperparathyroidism.  I think he understands that his serum calcium is elevated and possibly this is due to hyperparathyroidism.  He needs further evaluation.  Order has been placed for nuclear medicine study. ? ?20 minutes spent looking at recent lab studies including serum calcium and PTH and explaining to patient further evaluation is necessary. ? ?Also medical clearance faxed to Dr. Michail Sermon from cardiology for upcoming colonoscopy ? ?

## 2021-07-20 NOTE — Patient Instructions (Signed)
Nuclear medicine study ordered to see if patient has hyperparathyroidism. ?

## 2021-07-20 NOTE — Telephone Encounter (Signed)
Pre the pre op provider I am forwarding this to the pt's primary cardiologist, Dr. [name].  ? ?If any further questions please address with the pre op provider. Please do not send back to me personally as I may not be in the office.  ?

## 2021-07-20 NOTE — Telephone Encounter (Signed)
Preoperative team, please forward this message to primary cardiologist so that they may review and make recommendations.  Thank you for your help. ? ?Jossie Ng. Pilar Westergaard NP-C ? ?  ?07/20/2021, 3:47 PM ?[place] ?[address] ?Office [phone] Fax [phone] ? ?

## 2021-07-20 NOTE — Telephone Encounter (Signed)
New Message: ? ? ? ?Wife called and said  Dr Cleotilde Neer office needs to know what material patient's stents are made of, so they know where to schedule MRI. Please fax to [phone] Att: Jacqlyn Larsen a ?

## 2021-07-21 NOTE — Telephone Encounter (Signed)
? ?  Primary Cardiologist: [name], MD ? ?Chart reviewed as part of pre-operative protocol coverage. Given past medical history and time since last visit, based on ACC/AHA guidelines, Johnny [name] would be at acceptable risk for the planned procedure without further cardiovascular testing.  ? ?Per Dr.Nishan all cardiac stents are compatible with MRI. ? ?I will route this recommendation to the requesting party via Epic fax function and remove from pre-op pool. ? ?Please call with questions. ? ?Jossie Ng. Rosevelt Luu NP-C ? ?  ?07/21/2021, 9:25 AM ?[place] ?[address] ?Office [phone] Fax [phone] ? ? ? ? ?

## 2021-07-25 ENCOUNTER — Other Ambulatory Visit (HOSPITAL_BASED_OUTPATIENT_CLINIC_OR_DEPARTMENT_OTHER): Payer: Self-pay | Admitting: Neurosurgery

## 2021-07-25 DIAGNOSIS — M5416 Radiculopathy, lumbar region: Secondary | ICD-10-CM

## 2021-07-28 ENCOUNTER — Ambulatory Visit (HOSPITAL_BASED_OUTPATIENT_CLINIC_OR_DEPARTMENT_OTHER)
Admission: RE | Admit: 2021-07-28 | Discharge: 2021-07-28 | Disposition: A | Discharge status: Home / Self Care | Payer: Medicare Other | Source: Ambulatory Visit | Attending: Neurosurgery | Admitting: Neurosurgery

## 2021-07-28 ENCOUNTER — Other Ambulatory Visit: Payer: Self-pay

## 2021-07-28 DIAGNOSIS — Z981 Arthrodesis status: Secondary | ICD-10-CM | POA: Insufficient documentation

## 2021-07-28 DIAGNOSIS — M5416 Radiculopathy, lumbar region: Secondary | ICD-10-CM | POA: Diagnosis present

## 2021-07-28 DIAGNOSIS — M48061 Spinal stenosis, lumbar region without neurogenic claudication: Secondary | ICD-10-CM | POA: Insufficient documentation

## 2021-07-31 ENCOUNTER — Telehealth: Payer: Self-pay | Admitting: Internal Medicine

## 2021-07-31 NOTE — Telephone Encounter (Signed)
Being referred to Endocrinology for hypercalcemia. Patient not available to speak with. Wife answered phone and was advised of this. MJB, MD ?

## 2021-08-07 ENCOUNTER — Other Ambulatory Visit: Payer: Self-pay | Admitting: Cardiovascular Disease

## 2021-08-07 DIAGNOSIS — I10 Essential (primary) hypertension: Secondary | ICD-10-CM

## 2021-08-14 ENCOUNTER — Ambulatory Visit: Payer: Medicare Other

## 2021-08-16 ENCOUNTER — Telehealth: Payer: Self-pay

## 2021-08-16 NOTE — Telephone Encounter (Signed)
? ?  Pre-operative Risk Assessment  ?  ?Patient Name: Johnny [name]  ?DOB: 10/11/1950 ?MRN: [id number]  ? ?  ? ?Request for Surgical Clearance   ? ?Procedure:   [id number]-[id number] ? ?Date of Surgery:  Clearance 09/07/21                              ?   ?Surgeon:  Dr Lenord Carbo ?Surgeon's Group or Practice Name:  [facility] ?Phone number:  [phone] ?Fax number:  [phone] ?  ?Type of Clearance Requested:   ?- Pharmacy:  Hold Clopidogrel (Plavix) for 7 days prior ?  ?Type of Anesthesia:  Not Indicated ?  ?Additional requests/questions:   ? ?Signed, ?Bevelyn Buckles L   ?08/16/2021, 1:29 PM  ? ?

## 2021-08-16 NOTE — Telephone Encounter (Signed)
Yes.  There was no reason for the patient to restart Plavix between the 2 surgeries since they are only 5 days apart. ?

## 2021-08-16 NOTE — Telephone Encounter (Signed)
Please inform the patient to hold Plavix for 7 days prior to back injection.  I attempted to call the patient, he did not answer.  Voicemail box is full and unable to leave message.  ?

## 2021-08-16 NOTE — Telephone Encounter (Signed)
Called [facility]surgery [facility] and left message with surgery scheduler to call back regarding mutual patient. Just need to find out what anaesthesia will be used.  ?

## 2021-08-16 NOTE — Telephone Encounter (Signed)
? ? ?  Patient Name: Johnny [name]  ?DOB: 04/23/1951 ?MRN: [id number] ? ?Primary Cardiologist: [name], MD ? ?Chart reviewed as part of pre-operative protocol coverage. Given past medical history and time since last visit, based on ACC/AHA guidelines, Johnny [name] would be at acceptable risk for the planned procedure without further cardiovascular testing.  ? ?Patient may hold Plavix for 7 days prior to the procedure and restart as soon as possible afterward at the surgeon's discretion. ? ?I will route this recommendation to the requesting party via Epic fax function and remove from pre-op pool. ? ?Please call with questions. ? ?Almyra Deforest, [place] ?08/16/2021, 1:48 PM ? ?

## 2021-08-16 NOTE — Telephone Encounter (Signed)
Per patients wife (DPR on file) patient also has a colonoscopy scheduled for 09/12/21 and this procedure scheduled for 09/07/21. Okay for patient to be off of the plavix for that length of time? Would be more like a 10 day hold. ?

## 2021-08-17 NOTE — Telephone Encounter (Signed)
I just s/w pt and his wife and have gone over the recommendations about holding Plavix. See notes from pre op provider Almyra Deforest, PAC. Pt's wife has been advised ok for pt to hold his Plavix x 7 days for Dr. Davy Pique procedure, injection, Pt to remain off Plavix until colonoscopy on 09/12/21 and will resume once felt safe by GI doctor to resume.   ?

## 2021-08-24 NOTE — Progress Notes (Deleted)
Patient ID: [name]                 DOB: 03-16-1951                    MRN: [id number] ? ? ? ? ?HPI: ?Johnny [name] is a 71 y.o. male patient of Johnny [name] referred to lipid clinic by Johnny Barrios, PA. PMH is significant for CAD s/p remote MI and DES to circumflex in 2021, HTN, and HLD, GERD. Admitted Feb 2021 for chest pain and found to have severe mid to distal LCx stenosis requiring overlapping DES x2 with residual nonobstructive disease to LAD, RCA and PDA branch. Seen by PharmD HLD clinic in Feb 2021 where he had cut back on fried food and red meat and was tolerating low-dose rosuvastatin. Pt reported history of myalgias with atorvastatin and pravastatin and previously tolerated ezetimibe but provider switched him to a statin instead. Remembered taking fenofibrate and having no problems - doesn't recall why it was stopped. PharmD recommended starting Vascepa for elevated TG (268) above goal <150 and Nexlizet for an additional 40% LDL lowering - pt wanted to confer with PCP. Agreed to increasing rosuvastatin to 10 mg daily and to decrease back to 5 mg if he developed myalgias. Repeat lipid panel improved but pt experienced myalgias on 10 mg daily - rosuvastatin reduced to 5 mg daily and Zetia 10 mg daily was added. ? ?Seen by Johnny [name] 07/26/2020 where LDL was above goal of 60 (LDL 140 mg/dL) on rosuvastatin 5 mg daily. Discussed bempedoic acid and Inclisiran, patient wanted to think it over. Saw Johnny [name] in Feb 2023 where pt reported not tolerating low-dose rosuvastatin and LDL above goal <70 mg/dL. ? ?Still taking Zetia? - prescribed ezetimibe April 2021 - no fill history ?Thoughts on Nexletol or Leqvio? ? ? ?Current Medications: Rosuvastatin 5 mg ?Intolerances: atorvastatin (joint pain in 2017), pravastatin (intolerance in 2018), Praluent (muscle pain, flu-like symptoms in 2020) ? ?Risk Factors: HTN, CAD with h/o distant MI and stents placed in 2021 ?LDL goal: <70 mg/dL ? ?Diet:  ? ?Exercise:   ? ?Family History:  ? ?Social History:  ? ?Labs: ?07/07/21: LDL 140, HDL 43, TC 225, TG 271 ?06/14/20: LDL 103, HDL 44, TC 188, TG 188 ?07/15/19: LDL 71, HDL 37, TC 146, TG 232 - on rosuvastatin 10 mg daily ? ?Past Medical History:  ?Diagnosis Date  ? Adenomatous polyps   ? Bronchitis   ? CAD in native artery 2017  ? Chronic back pain   ? GERD (gastroesophageal reflux disease)   ? Hyperlipidemia   ? Hypertension   ? Internal hemorrhoids   ? Low testosterone   ? Myocardial infarction [facility])   ? Osteopenia   ? Shortness of breath dyspnea   ? Spondylosis   ? TIA (transient ischemic attack) 2008  ? Ureterolithiasis   ? Ventral hernia   ? Vitamin D deficiency   ? ? ?Current Outpatient Medications on File Prior to Visit  ?Medication Sig Dispense Refill  ? clopidogrel (PLAVIX) 75 MG tablet TAKE 1 TABLET BY MOUTH EVERY DAY 90 tablet 1  ? Cyanocobalamin (VITAMIN B-12) 1000 MCG SUBL Place 1 each under the tongue every morning.    ? nebivolol (BYSTOLIC) 2.5 MG tablet TAKE 1 TABLET BY MOUTH EVERY DAY 90 tablet 3  ? nitroGLYCERIN (NITROSTAT) 0.4 MG SL tablet Place 0.4 mg under the tongue every 5 (five) minutes as needed for chest pain.    ?  oxyCODONE-acetaminophen (PERCOCET) 10-325 MG tablet Take 1 tablet by mouth every 6 (six) hours as needed for pain.     ? ranolazine (RANEXA) 500 MG 12 hr tablet TAKE 1 TABLET BY MOUTH TWICE A DAY 180 tablet 3  ? rosuvastatin (CRESTOR) 5 MG tablet Take 5 mg by mouth daily.    ? tamsulosin (FLOMAX) 0.4 MG CAPS capsule Take 1 capsule (0.4 mg total) by mouth daily. 90 capsule 3  ? ?No current facility-administered medications on file prior to visit.  ? ? ?Allergies  ?Allergen Reactions  ? Atorvastatin Other (See Comments)  ?  Joint pain  ? Praluent [Alirocumab] Other (See Comments)  ?  Muscle pain and flu like symptoms  ? Isosorbide Nitrate   ? Hydrocodone Nausea Only  ?  Can take with food  ? Levofloxacin Nausea And Vomiting  ? Levofloxacin In D5w Nausea And Vomiting  ? Metadate Cd [Methylphenidate  Hcl] Other (See Comments)  ?  Burning, GI upset, bloating  ? Morphine And Related Nausea And Vomiting  ? Other Other (See Comments)  ?   Isosorbide - severe HA  ? Pravastatin Other (See Comments)  ? ? ?Assessment/Plan: ? ?1. Hyperlipidemia -  ? ? ?Thank you, ? ? ?Johnny [name], Pharm.D, BCPS, CPP ?[id number], [place],  [address]  ?Phone: [phone]; Fax: [phone]  ? ? ?

## 2021-08-29 ENCOUNTER — Ambulatory Visit: Payer: Medicare Other | Admitting: Pharmacist

## 2021-08-29 DIAGNOSIS — E7800 Pure hypercholesterolemia, unspecified: Secondary | ICD-10-CM | POA: Diagnosis not present

## 2021-08-29 DIAGNOSIS — I214 Non-ST elevation (NSTEMI) myocardial infarction: Secondary | ICD-10-CM

## 2021-08-29 MED ORDER — ROSUVASTATIN CALCIUM 5 MG PO TABS: 5.0000 mg | ORAL_TABLET | Freq: Every day | ORAL | 3 refills | Status: DC

## 2021-08-29 NOTE — Patient Instructions (Addendum)
Resume taking rosuvastatin '5mg'$  daily ?Watch for added sugars, try to decrease sugar/starch intake ?Repeat labs on 6/30. You can come starting at 7:15AM ?

## 2021-08-29 NOTE — Progress Notes (Signed)
Patient ID: Johnny [name]                 DOB: 14-Apr-1951                    MRN: [id number] ? ? ? ? ?HPI: ?Johnny [name] is a 71 y.o. male patient of Dr. Johnsie [name] referred to lipid clinic by Johnny Barrios, PA. PMH is significant for CAD s/p remote MI and DES to circumflex in 2021, HTN, and HLD, GERD. Admitted Feb 2021 for chest pain and found to have severe mid to distal LCx stenosis requiring overlapping DES x2 with residual nonobstructive disease to LAD, RCA and PDA branch. Seen by PharmD HLD clinic in Feb 2021 where he had cut back on fried food and red meat and was tolerating low-dose rosuvastatin. Pt reported history of myalgias with atorvastatin and pravastatin and previously tolerated ezetimibe but provider switched him to a statin instead. Remembered taking fenofibrate and having no problems - doesn't recall why it was stopped. PharmD recommended starting Johnny [name] for elevated TG (268) above goal <150 and Johnny [name] for an additional 40% LDL lowering - pt wanted to confer with PCP. Agreed to increasing rosuvastatin to 10 mg daily and to decrease back to 5 mg if he developed myalgias. Repeat lipid panel improved but pt experienced myalgias on 10 mg daily - rosuvastatin reduced to 5 mg daily and Zetia 10 mg daily was added. ? ?Seen by Johnny [name] 07/26/2020 where LDL was above goal of 60 (LDL 140 mg/dL) on rosuvastatin 5 mg daily. Discussed bempedoic acid and Inclisiran, patient wanted to think it over. Saw Johnny [name] in Feb 2023 where pt reported not tolerating low-dose rosuvastatin and LDL above goal <70 mg/dL. ? ?Patient presents to clinic today, accompanied by his wife. He stopped his rosuvastatin several months ago. He had a fall in January, cracked some ribs, is having issues with sciatica and deterioration around his L4L5 fusion. Plans for an epidural injection 4/20. He has not been as active due to pain. Starting to walk for 30 min to an hour and plans to start using stationary bike.  ? ?He says he  used the zetia for a brief period of time. Doesn't remember any specific issues with it, but stopped taking it. ? ? ?Current Medications: none ?Intolerances: atorvastatin (joint pain in 2017), pravastatin (intolerance in 2018), Praluent (muscle pain, flu-like symptoms in 2020) ? ?Risk Factors: HTN, CAD with h/o distant MI and stents placed in 2021 ?LDL goal: <55 mg/dL ? ?Diet:  ?Breakfast: tater tots, egg whites, whole grain toast ?Lunch/dinner: salad, salmon ?Snacks: quina chips, fruit bars, raisin bran ?Drinks: water, black coffee ? ?Exercise:  starting to resume walking 30-66mn ? ?Family History:  ?Family History  ?Problem Relation Age of Onset  ? Heart disease Mother   ? Stroke Mother   ? Heart attack Mother   ? Emphysema Father   ? ? ? ?Social History:  ?Social History  ? ?Socioeconomic History  ? Marital status: Married  ?  Spouse name: Not on file  ? Number of children: 4  ? Years of education: 12 ? Highest education level: Not on file  ?Occupational History  ? 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 Smoking status: Former  ?  Years: 10.00  ?  Types: Cigarettes  ?  Quit date: 01/04/1978  ?  Years since quitting: 43.6  ? Smokeless tobacco: Never  ?Vaping Use  ? Vaping Use: Never used  ?Substance  and Sexual Activity  ? Alcohol use: No  ?  Alcohol/week: 0.0 standard drinks  ?  Comment: rarely  ? Drug use: No  ? Sexual activity: Not on file  ?Other Topics Concern  ? Not on file  ?Social History Narrative  ? Lives at home with his wife.  ? Right-handed.  ? 2 cups caffeine per day.  ?   ? Social history: He is married.  He had a son involved in a serious motor vehicle accident which took over a year to recover.  His wife has multiple sclerosis and is disabled.  He is a retired Agricultural consultant.  He does not smoke.  Very occasional alcohol consumption.  2 other adult children.  ?    ? ?Social Determinants of Health  ? ?Financial Resource Strain: Not on file  ?Food Insecurity: Not on file  ?Transportation Needs: Not on  file  ?Physical Activity: Not on file  ?Stress: Not on file  ?Social Connections: Not on file  ?Intimate Partner Violence: Not on file  ? ? ? ?Labs: ?07/07/21: LDL 140, HDL 43, TC 225, TG 271 (no therapy) ?06/14/20: LDL 103, HDL 44, TC 188, TG 188 ?07/15/19: LDL 71, HDL 37, TC 146, TG 232 - on rosuvastatin 10 mg daily ? ?Past Medical History:  ?Diagnosis Date  ? Adenomatous polyps   ? Bronchitis   ? CAD in native artery 2017  ? Chronic back pain   ? GERD (gastroesophageal reflux disease)   ? Hyperlipidemia   ? Hypertension   ? Internal hemorrhoids   ? Low testosterone   ? Myocardial infarction [facility])   ? Osteopenia   ? Shortness of breath dyspnea   ? Spondylosis   ? TIA (transient ischemic attack) 2008  ? Ureterolithiasis   ? Ventral hernia   ? Vitamin D deficiency   ? ? ?Current Outpatient Medications on File Prior to Visit  ?Medication Sig Dispense Refill  ? clopidogrel (PLAVIX) 75 MG tablet TAKE 1 TABLET BY MOUTH EVERY DAY 90 tablet 1  ? Cyanocobalamin (VITAMIN B-12) 1000 MCG SUBL Place 1 each under the tongue every morning.    ? nebivolol (BYSTOLIC) 2.5 MG tablet TAKE 1 TABLET BY MOUTH EVERY DAY 90 tablet 3  ? nitroGLYCERIN (NITROSTAT) 0.4 MG SL tablet Place 0.4 mg under the tongue every 5 (five) minutes as needed for chest pain.    ? oxyCODONE-acetaminophen (PERCOCET) 10-325 MG tablet Take 1 tablet by mouth every 6 (six) hours as needed for pain.     ? ranolazine (RANEXA) 500 MG 12 hr tablet TAKE 1 TABLET BY MOUTH TWICE A DAY 180 tablet 3  ? tamsulosin (FLOMAX) 0.4 MG CAPS capsule Take 1 capsule (0.4 mg total) by mouth daily. 90 capsule 3  ? ?No current facility-administered medications on file prior to visit.  ? ? ?Allergies  ?Allergen Reactions  ? Atorvastatin Other (See Comments)  ?  Joint pain  ? Praluent [Alirocumab] Other (See Comments)  ?  Muscle pain and flu like symptoms  ? Isosorbide Nitrate   ? Hydrocodone Nausea Only  ?  Can take with food  ? Levofloxacin Nausea And Vomiting  ? Levofloxacin In D5w  Nausea And Vomiting  ? Metadate Cd [Methylphenidate Hcl] Other (See Comments)  ?  Burning, GI upset, bloating  ? Morphine And Related Nausea And Vomiting  ? Other Other (See Comments)  ?   Isosorbide - severe HA  ? Pravastatin Other (See Comments)  ? ? ?Assessment/Plan: ? ?1. Hyperlipidemia - LDL-C  is above goal of <55 due to progressive disease. Patient is agreeable to restating rosuvastatin '5mg'$  daily. Will recheck lipids in 3 months (6/30). Patient not amendable to add on therapy yet. Would like to work on diet and increase activity. We did discuss Zetia or possibly Johnny [name] as add on in the future. Currently Leqvio would not be cost effective. TG were elevated. We discussed the effects of high sugar/carb content of foods like cereals, fruit bars, potatoes on his triglycerides. Patient will work on improving his diet.  ? ?We discussed the 5 modifiable risk factors for CVD (lipids, BP, Tobacco use, diet and exercise) BP well controlled, patient does not use tobacco. Lipids discussed above. I have encouraged patient to eat a diet of real food, ie avoid processed foods (ie most foods with a nutrition label) and watch for added sugars. I have encouraged patient to increase exercise. Daily walking. ? ?Thank you, ? ?Ramond Dial, Pharm.D, BCPS, CPP ?[id number] [place], [place] [address]  ?Phone: [phone]; Fax: [phone]  ? ? ?

## 2021-09-06 ENCOUNTER — Observation Stay (HOSPITAL_COMMUNITY): Payer: Medicare Other

## 2021-09-06 ENCOUNTER — Encounter (HOSPITAL_COMMUNITY): Payer: Self-pay | Admitting: Emergency Medicine

## 2021-09-06 ENCOUNTER — Observation Stay (HOSPITAL_BASED_OUTPATIENT_CLINIC_OR_DEPARTMENT_OTHER): Payer: Medicare Other

## 2021-09-06 ENCOUNTER — Observation Stay (HOSPITAL_COMMUNITY)
Admission: EM | Admit: 2021-09-06 | Discharge: 2021-09-07 | Disposition: A | Discharge status: Home / Self Care | Payer: Medicare Other | Attending: Internal Medicine | Admitting: Internal Medicine

## 2021-09-06 ENCOUNTER — Other Ambulatory Visit: Payer: Self-pay

## 2021-09-06 ENCOUNTER — Emergency Department (HOSPITAL_COMMUNITY): Payer: Medicare Other

## 2021-09-06 DIAGNOSIS — I2510 Atherosclerotic heart disease of native coronary artery without angina pectoris: Secondary | ICD-10-CM | POA: Insufficient documentation

## 2021-09-06 DIAGNOSIS — I10 Essential (primary) hypertension: Secondary | ICD-10-CM | POA: Diagnosis not present

## 2021-09-06 DIAGNOSIS — Z87891 Personal history of nicotine dependence: Secondary | ICD-10-CM | POA: Diagnosis not present

## 2021-09-06 DIAGNOSIS — R519 Headache, unspecified: Secondary | ICD-10-CM | POA: Diagnosis not present

## 2021-09-06 DIAGNOSIS — Z79899 Other long term (current) drug therapy: Secondary | ICD-10-CM | POA: Insufficient documentation

## 2021-09-06 DIAGNOSIS — E7800 Pure hypercholesterolemia, unspecified: Secondary | ICD-10-CM | POA: Diagnosis not present

## 2021-09-06 DIAGNOSIS — Z955 Presence of coronary angioplasty implant and graft: Secondary | ICD-10-CM | POA: Diagnosis not present

## 2021-09-06 DIAGNOSIS — Z7902 Long term (current) use of antithrombotics/antiplatelets: Secondary | ICD-10-CM | POA: Diagnosis not present

## 2021-09-06 DIAGNOSIS — R2689 Other abnormalities of gait and mobility: Secondary | ICD-10-CM | POA: Diagnosis not present

## 2021-09-06 DIAGNOSIS — G459 Transient cerebral ischemic attack, unspecified: Secondary | ICD-10-CM

## 2021-09-06 DIAGNOSIS — R29818 Other symptoms and signs involving the nervous system: Secondary | ICD-10-CM

## 2021-09-06 DIAGNOSIS — Z8673 Personal history of transient ischemic attack (TIA), and cerebral infarction without residual deficits: Secondary | ICD-10-CM | POA: Insufficient documentation

## 2021-09-06 DIAGNOSIS — R4701 Aphasia: Secondary | ICD-10-CM | POA: Diagnosis present

## 2021-09-06 LAB — CBC
HCT: 41.8 % (ref 39.0–52.0)
Hemoglobin: 14.4 g/dL (ref 13.0–17.0)
MCH: 34.4 pg — ABNORMAL HIGH (ref 26.0–34.0)
MCHC: 34.4 g/dL (ref 30.0–36.0)
MCV: 99.8 fL (ref 80.0–100.0)
Platelets: 191 10*3/uL (ref 150–400)
RBC: 4.19 MIL/uL — ABNORMAL LOW (ref 4.22–5.81)
RDW: 12.2 % (ref 11.5–15.5)
WBC: 5.6 10*3/uL (ref 4.0–10.5)
nRBC: 0 % (ref 0.0–0.2)

## 2021-09-06 LAB — I-STAT CHEM 8, ED
BUN: 10 mg/dL (ref 8–23)
Calcium, Ion: 1.23 mmol/L (ref 1.15–1.40)
Chloride: 104 mmol/L (ref 98–111)
Creatinine, Ser: 1.2 mg/dL (ref 0.61–1.24)
Glucose, Bld: 107 mg/dL — ABNORMAL HIGH (ref 70–99)
HCT: 43 % (ref 39.0–52.0)
Hemoglobin: 14.6 g/dL (ref 13.0–17.0)
Potassium: 4 mmol/L (ref 3.5–5.1)
Sodium: 138 mmol/L (ref 135–145)
TCO2: 24 mmol/L (ref 22–32)

## 2021-09-06 LAB — COMPREHENSIVE METABOLIC PANEL
ALT: 18 U/L (ref 0–44)
AST: 20 U/L (ref 15–41)
Albumin: 4 g/dL (ref 3.5–5.0)
Alkaline Phosphatase: 39 U/L (ref 38–126)
Anion gap: 8 (ref 5–15)
BUN: 10 mg/dL (ref 8–23)
CO2: 24 mmol/L (ref 22–32)
Calcium: 9.8 mg/dL (ref 8.9–10.3)
Chloride: 105 mmol/L (ref 98–111)
Creatinine, Ser: 1.19 mg/dL (ref 0.61–1.24)
GFR, Estimated: >60 mL/min (ref >60)
Glucose, Bld: 112 mg/dL — ABNORMAL HIGH (ref 70–99)
Potassium: 4.2 mmol/L (ref 3.5–5.1)
Sodium: 137 mmol/L (ref 135–145)
Total Bilirubin: 1.8 mg/dL — ABNORMAL HIGH (ref 0.3–1.2)
Total Protein: 6.6 g/dL (ref 6.5–8.1)

## 2021-09-06 LAB — LIPID PANEL
Cholesterol: 168 mg/dL (ref 0–200)
HDL: 38 mg/dL — ABNORMAL LOW (ref >40)
LDL Cholesterol: 97 mg/dL (ref 0–99)
Total CHOL/HDL Ratio: 4.4 RATIO
Triglycerides: 164 mg/dL — ABNORMAL HIGH (ref <150)
VLDL: 33 mg/dL (ref 0–40)

## 2021-09-06 LAB — DIFFERENTIAL
Abs Immature Granulocytes: 0.02 10*3/uL (ref 0.00–0.07)
Basophils Absolute: 0 10*3/uL (ref 0.0–0.1)
Basophils Relative: 1 %
Eosinophils Absolute: 0.2 10*3/uL (ref 0.0–0.5)
Eosinophils Relative: 3 %
Immature Granulocytes: 0 %
Lymphocytes Relative: 33 %
Lymphs Abs: 1.9 10*3/uL (ref 0.7–4.0)
Monocytes Absolute: 0.5 10*3/uL (ref 0.1–1.0)
Monocytes Relative: 8 %
Neutro Abs: 3 10*3/uL (ref 1.7–7.7)
Neutrophils Relative %: 55 %

## 2021-09-06 LAB — ECHOCARDIOGRAM COMPLETE
AR max vel: 3.73 cm2
AV Area VTI: 3.19 cm2
AV Area mean vel: 3.03 cm2
AV Mean grad: 4 mmHg
AV Peak grad: 7.6 mmHg
Ao pk vel: 1.38 m/s
Area-P 1/2: 3.02 cm2
Height: 72 in
S' Lateral: 2.6 cm
Weight: 3056 oz

## 2021-09-06 LAB — TROPONIN I (HIGH SENSITIVITY)
Troponin I (High Sensitivity): 3 ng/L (ref <18)
Troponin I (High Sensitivity): 4 ng/L (ref <18)

## 2021-09-06 LAB — APTT: aPTT: 28 seconds (ref 24–36)

## 2021-09-06 LAB — PROTIME-INR
INR: 1.1 (ref 0.8–1.2)
Prothrombin Time: 13.7 seconds (ref 11.4–15.2)

## 2021-09-06 LAB — HEMOGLOBIN A1C
Hgb A1c MFr Bld: 5.1 % (ref 4.8–5.6)
Mean Plasma Glucose: 99.67 mg/dL

## 2021-09-06 MED ORDER — AMLODIPINE BESYLATE 5 MG PO TABS
2.5000 mg | ORAL_TABLET | Freq: Every day | ORAL | Status: DC
  Administered 2021-09-06: 2.5 mg via ORAL
  Filled 2021-09-06: qty 1

## 2021-09-06 MED ORDER — OXYCODONE-ACETAMINOPHEN 10-325 MG PO TABS: 1.0000 | ORAL_TABLET | Freq: Four times a day (QID) | ORAL | Status: DC | PRN

## 2021-09-06 MED ORDER — ACETAMINOPHEN 650 MG RE SUPP: 650.0000 mg | RECTAL | Status: DC | PRN

## 2021-09-06 MED ORDER — IOHEXOL 350 MG/ML SOLN
75.0000 mL | Freq: Once | INTRAVENOUS | Status: AC | PRN
  Administered 2021-09-06: 75 mL via INTRAVENOUS

## 2021-09-06 MED ORDER — ENOXAPARIN SODIUM 40 MG/0.4ML IJ SOSY
40.0000 mg | PREFILLED_SYRINGE | INTRAMUSCULAR | Status: DC
  Administered 2021-09-06: 40 mg via SUBCUTANEOUS
  Filled 2021-09-06: qty 0.4

## 2021-09-06 MED ORDER — OXYCODONE HCL 5 MG PO TABS
5.0000 mg | ORAL_TABLET | Freq: Four times a day (QID) | ORAL | Status: DC | PRN
  Administered 2021-09-06 – 2021-09-07 (×2): 5 mg via ORAL
  Filled 2021-09-06 (×2): qty 1

## 2021-09-06 MED ORDER — ASPIRIN EC 81 MG PO TBEC
81.0000 mg | DELAYED_RELEASE_TABLET | Freq: Every day | ORAL | Status: DC
  Administered 2021-09-07: 81 mg via ORAL
  Filled 2021-09-06 (×2): qty 1

## 2021-09-06 MED ORDER — ACETAMINOPHEN 325 MG PO TABS: 650.0000 mg | ORAL_TABLET | ORAL | Status: DC | PRN

## 2021-09-06 MED ORDER — SODIUM CHLORIDE 0.9 % IV SOLN: Freq: Once | INTRAVENOUS | Status: DC

## 2021-09-06 MED ORDER — NEBIVOLOL HCL 2.5 MG PO TABS
2.5000 mg | ORAL_TABLET | Freq: Every day | ORAL | Status: DC
  Administered 2021-09-07: 2.5 mg via ORAL
  Filled 2021-09-06 (×3): qty 1

## 2021-09-06 MED ORDER — ROSUVASTATIN CALCIUM 5 MG PO TABS
5.0000 mg | ORAL_TABLET | Freq: Every day | ORAL | Status: DC
  Administered 2021-09-07: 5 mg via ORAL
  Filled 2021-09-06: qty 1

## 2021-09-06 MED ORDER — SODIUM CHLORIDE 0.9 % IV BOLUS
1000.0000 mL | Freq: Once | INTRAVENOUS | Status: AC
  Administered 2021-09-06: 1000 mL via INTRAVENOUS

## 2021-09-06 MED ORDER — CLOPIDOGREL BISULFATE 75 MG PO TABS
75.0000 mg | ORAL_TABLET | Freq: Every day | ORAL | Status: DC
  Administered 2021-09-07: 75 mg via ORAL
  Filled 2021-09-06: qty 1

## 2021-09-06 MED ORDER — OXYCODONE-ACETAMINOPHEN 5-325 MG PO TABS
1.0000 | ORAL_TABLET | Freq: Four times a day (QID) | ORAL | Status: DC | PRN
  Administered 2021-09-06 – 2021-09-07 (×2): 1 via ORAL
  Filled 2021-09-06 (×3): qty 1

## 2021-09-06 MED ORDER — ACETAMINOPHEN 160 MG/5ML PO SOLN: 650.0000 mg | ORAL | Status: DC | PRN

## 2021-09-06 MED ORDER — RANOLAZINE ER 500 MG PO TB12
500.0000 mg | ORAL_TABLET | Freq: Two times a day (BID) | ORAL | Status: DC
  Administered 2021-09-06 – 2021-09-07 (×2): 500 mg via ORAL
  Filled 2021-09-06 (×3): qty 1

## 2021-09-06 MED ORDER — STROKE: EARLY STAGES OF RECOVERY BOOK
Freq: Once | Status: AC
  Filled 2021-09-06: qty 1

## 2021-09-06 NOTE — ED Provider Triage Note (Signed)
Emergency Medicine Provider Triage Evaluation Note ? ?Johnny [name] , a 71 y.o. male  was evaluated in triage.  Pt complains of speech difficulty this morning.  Accompanied by son.  Last known well 11 PM.  States this morning he woke up around 6:30 AM was having difficulty with word finding.  Son states that he is "foggy."  Patient is on Plavix but has not taken in 1 week for upcoming colonoscopy.  Denies hitting his head or loss of consciousness.  Denies sensation changes.  Denies weakness. ? ?Review of Systems  ?Positive: See above ?Negative:  ? ?Physical Exam  ?BP 138/70 (BP Location: Left Arm)   Pulse 65   Temp 98.4 ?F (36.9 ?C) (Oral)   Resp 14   Ht 6' (1.829 m)   Wt 86.6 kg   SpO2 100%   BMI 25.90 kg/m?  ?Gen:   Awake, no distress, alert oriented to self, place, disoriented to month ?Resp:  Normal effort  ?MSK:   Moves extremities without difficulty  ?Other:  Cranial nerves II through XII intact.  Sensation intact.  No focal neurological deficits. ? ?Medical Decision Making  ?Medically screening exam initiated at 9:11 AM.  Appropriate orders placed.  KEROLOS NEHME was informed that the remainder of the evaluation will be completed by another provider, this initial triage assessment does not replace that evaluation, and the importance of remaining in the ED until their evaluation is complete. ? ?Outside of window for TNK.  Stroke orders in place. ?  ?Mickie Hillier, PA-C ?09/06/21 0914 ? ?

## 2021-09-06 NOTE — ED Notes (Signed)
Pt c/oa severe headache ?

## 2021-09-06 NOTE — ED Notes (Signed)
Pt alert and oriented.

## 2021-09-06 NOTE — Progress Notes (Signed)
Echocardiogram ?2D Echocardiogram has been performed. ? ?Arlyss Gandy ?09/06/2021, 3:19 PM ?

## 2021-09-06 NOTE — ED Triage Notes (Signed)
Patient arrives POV states his son brought him in for aphasia. Patient states when he woke up this morning he was having a hard time finding the words to speak to his wife. States his speech has improved since then. Sensation is lighter on the left side. Took 2 baby aspirin this morning. Not had plavix for 5 days since colonoscopy scheduled.  ?

## 2021-09-06 NOTE — Consult Note (Addendum)
Neurology Consultation ?Reason for Consult: Transient speech disturbance ?Requesting Physician: Fuller Plan ? ?CC: Headache and speech issues ? ?History is obtained from: Patient, wife at bedside, chart review ? ?HPI: Johnny [name] is a 71 y.o. male with a past medical history significant for hypertension, hyperlipidemia (PSK 9 inhibitor and statin intolerant secondary to myalgias with both of them), coronary artery disease s/p MI and stent placement, migraines with aura, prior smoking, L4/L5 fusion with persistent sciatica. ? ?This morning he had a severe headache and could not speak to his wife with his speech being very garbled and difficult to understand.  This lasted about 1 hour but has returned at this point to baseline.  His headache also resolved over the course of about 2 hours.  He was evaluated by his son who works as an Systems developer first responder Armed forces logistics/support/administrative officer), who administered 162 mg of aspirin and instructed him to go to the ED for further evaluation.  There was also some concern for possible left-sided weakness. ? ?Due to worsening sciatica in the past few weeks/months he has been scheduled for an epidural tomorrow as well as routine colonoscopy next week, for which he was holding his Plavix.  He notes that he is on Plavix monotherapy as opposed to aspirin due to GI distress with aspirin.  He is generally concerned with medication side effects, asking repeatedly if his symptoms this morning may have been secondary to statin use. ? ?Review of systems is fairly pan positive, including report of "processing" difficulties in the past few years that have gradually been worsening, but more markedly worse in the past 2 months (wherein he will have trouble understanding what is said and have slow speech).  He gets migraine headaches with starburst (per wife) visual aura about once a month (on chart review, aura is right-sided vision loss), has had increased blurry vision in general that he notices when he is  reading, denies any new cardiac symptoms other than some transient chest pain today when going into MRI which she feels may be anxiety related, notes some intermittent hematuria which is presently resolved, hemorrhoids but no other rectal bleeding, no infectious symptoms but feels cold secondary to using Plavix.  He is being followed by the lipid clinic for cholesterol management, and they recently restarted rosuvastatin at 5 mg dose ? ?LKW: 11:30 PM ?tPA given?: No, symptoms resolved ?Premorbid modified rankin scale:  ?    1 - No significant disability. Able to carry out all usual activities, despite some symptoms. ? ?ROS: All other review of systems was negative except as noted in the HPI.  ? ?Past Medical History:  ?Diagnosis Date  ? Adenomatous polyps   ? Bronchitis   ? CAD in native artery 2017  ? Chronic back pain   ? GERD (gastroesophageal reflux disease)   ? Hyperlipidemia   ? Hypertension   ? Internal hemorrhoids   ? Low testosterone   ? Myocardial infarction [facility])   ? Osteopenia   ? Shortness of breath dyspnea   ? Spondylosis   ? TIA (transient ischemic attack) 2008  ? Ureterolithiasis   ? Ventral hernia   ? Vitamin D deficiency   ? ?Past Surgical History:  ?Procedure Laterality Date  ? BACK SURGERY    ? 2 surgeries  ? CARDIAC CATHETERIZATION N/A 09/12/2015  ? Procedure: Left Heart Cath and Coronary Angiography;  Surgeon: Lorretta Harp, MD;  Location: [place] [facility];  Service: Cardiovascular;  Laterality: N/A;  ? CORONARY STENT INTERVENTION  N/A 06/23/2019  ? Procedure: CORONARY STENT INTERVENTION;  Surgeon: Sherren Mocha, MD;  Location: [place] [facility];  Service: Cardiovascular;  Laterality: N/A;  ? LEFT HEART CATH AND CORONARY ANGIOGRAPHY N/A 01/27/2018  ? Procedure: LEFT HEART CATH AND CORONARY ANGIOGRAPHY;  Surgeon: Jettie Booze, MD;  Location: [place] [facility];  Service: Cardiovascular;  Laterality: N/A;  ? LEFT HEART CATH AND CORONARY ANGIOGRAPHY N/A 06/23/2019  ? Procedure: LEFT HEART  CATH AND CORONARY ANGIOGRAPHY;  Surgeon: Sherren Mocha, MD;  Location: [place] [facility];  Service: Cardiovascular;  Laterality: N/A;  ? LEFT HEART CATH AND CORONARY ANGIOGRAPHY N/A 06/24/2020  ? Procedure: LEFT HEART CATH AND CORONARY ANGIOGRAPHY;  Surgeon: Troy Sine, MD;  Location: [place] [facility];  Service: Cardiovascular;  Laterality: N/A;  ? ? ? ?Family History  ?Problem Relation Age of Onset  ? Heart disease Mother   ? Stroke Mother   ? Heart attack Mother   ? Emphysema Father   ? ?Social History:  reports that he quit smoking about 43 years ago. His smoking use included cigarettes. He has never used smokeless tobacco. He reports that he does not drink alcohol and does not use drugs. ? ? ?Exam: ?Current vital signs: ?BP (!) 142/87   Pulse (!) 59   Temp 98.4 ?F (36.9 ?C) (Oral)   Resp 13   Ht 6' (1.829 m)   Wt 86.6 kg   SpO2 99%   BMI 25.90 kg/m?  ?Vital signs in last 24 hours: ?Temp:  [98.4 ?F (36.9 ?C)] 98.4 ?F (36.9 ?C) (04/19 0845) ?Pulse Rate:  [56-65] 59 (04/19 1230) ?Resp:  [13-16] 13 (04/19 1230) ?BP: (135-142)/(70-87) 142/87 (04/19 1230) ?SpO2:  [98 %-100 %] 99 % (04/19 1230) ?Weight:  [86.6 kg] 86.6 kg (04/19 0846) ? ? ?Physical Exam  ?Constitutional: Appears well-developed and well-nourished.  ?Psych: Affect appropriate to situation, fairly anxious, but calm and cooperative ?Eyes: No scleral injection ?HENT: No oropharyngeal obstruction.  Some missing teeth ?MSK: no severe joint deformities.  ?Cardiovascular: Perfusing extremities well ?Respiratory: Effort normal, non-labored breathing ?GI: Soft.  No distension. There is no tenderness.  ?Skin: Warm dry and intact visible skin ? ?Neuro: ?Mental Status: ?Patient is awake, alert, oriented to person, place, month, year, and situation. ?Patient is able to give a clear and coherent history. ?No signs of aphasia or neglect, he does have occasional mild stuttering to his speech or difficulty saying certain medical terms such as ocular  migraine, which wife reports is his recent baseline within the past few months ?Cranial Nerves: ?II: Visual Fields are full. Pupils are equal, round, and reactive to light.   ?III,IV, VI: EOMI without ptosis or diploplia.  ?V: Facial sensation is symmetric to temperature ?VII: Facial movement is symmetric.  ?VIII: hearing is intact to voice ?X: Uvula elevates symmetrically ?XI: Shoulder shrug is symmetric. ?XII: tongue is midline without atrophy or fasciculations.  ?Motor: ?Tone is normal. Bulk is normal. 5/5 strength was present in all four extremities.  ?Sensory: ?Sensation is symmetric to light touch in the arms and legs. ?Deep Tendon Reflexes: ?2+ and symmetric in the biceps ?Cerebellar: ?FNF and HKS are intact bilaterally ?Gait:  ?Deferred in acute setting  ? ?NIHSS total 0 ?Performed at 2:30 PM time of patient arrival to ED  ? ? ?I have reviewed labs in epic and the results pertinent to this consultation are: ? ?Basic Metabolic Panel: ?Recent Labs  ?Lab 09/06/21 ?2426 09/06/21 ?8341  ?NA 137 138  ?K  4.2 4.0  ?CL 105 104  ?CO2 24  --   ?GLUCOSE 112* 107*  ?BUN 10 10  ?CREATININE 1.19 1.20  ?CALCIUM 9.8  --   ? ? ?CBC: ?Recent Labs  ?Lab 09/06/21 ?0277 09/06/21 ?4128  ?WBC 5.6  --   ?NEUTROABS 3.0  --   ?HGB 14.4 14.6  ?HCT 41.8 43.0  ?MCV 99.8  --   ?PLT 191  --   ? ? ?Coagulation Studies: ?Recent Labs  ?  09/06/21 ?0850  ?LABPROT 13.7  ?INR 1.1  ?  ?Lab Results  ?Component Value Date  ? CHOL 225 (H) 07/07/2021  ? HDL 43 07/07/2021  ? LDLCALC 140 (H) 07/07/2021  ? TRIG 271 (H) 07/07/2021  ? CHOLHDL 5.2 (H) 07/07/2021  ? ?Lab Results  ?Component Value Date  ? HGBA1C 5.1 09/06/2021  ? ? ? ?I have reviewed the images obtained and agree with radiology, head CT without acute intracranial process, CTA without intervenable stenosis or LVO, MRI brain without acute intracranial process though there is some chronic microvascular disease ? ? ?Impression: This is a 71 year old gentleman with significant vascular risk  factors as detailed above, presenting with garbled speech in the setting of headache, now back to baseline.  Differential includes TIA, complex migraine, or postictal state.  Certainly his treatment options are limite

## 2021-09-06 NOTE — Procedures (Signed)
Patient Name: Johnny [name]  ?MRN: [id number]  ?Epilepsy Attending: Lora Havens  ?Referring Physician/Provider: Lorenza Chick, MD ?Date: 09/06/2021 ?Duration: 22.29 mins ? ?Patient history:  71 year old gentleman with significant vascular risk factors presenting with garbled speech in the setting of headache, now back to baseline. EEG to evaluate for seizure. ? ?Level of alertness: Awake, asleep ? ?AEDs during EEG study: None ? ?Technical aspects: This EEG study was done with scalp electrodes positioned according to the 10-20 International system of electrode placement. Electrical activity was acquired at a sampling rate of '500Hz'$  and reviewed with a high frequency filter of '70Hz'$  and a low frequency filter of '1Hz'$ . EEG data were recorded continuously and digitally stored.  ? ?Description: The posterior dominant rhythm consists of 8-'9Hz'$  activity of moderate voltage (25-35 uV) seen predominantly in posterior head regions, symmetric and reactive to eye opening and eye closing. Sleep was characterized by vertex waves, sleep spindles (12 to 14 Hz), maximal frontocentral region. Hyperventilation and photic stimulation were not performed.    ? ?IMPRESSION: ?This study is within normal limits. No seizures or epileptiform discharges were seen throughout the recording. ? ?Lora Havens  ? ?

## 2021-09-06 NOTE — ED Provider Notes (Signed)
?[facility] ?Provider Note ? ? ?CSN: [id number] ?Arrival date & time: 09/06/21  1245 ? ?  ? ?History ? ?Chief Complaint  ?Patient presents with  ? Aphasia  ? ? ?Johnny [name] is a 71 y.o. male. ? ? Patient as above with significant medical history as below, including prior CVA (per patient/son), ocular migraine, prior PCI (Dr Gwenlyn Found), hypertension, hyperlipidemia who presents to the ED with complaint of difficulty speaking.  Last known normal was approximately 11:30 PM to midnight last night.  He woke up around 6:00-6:30 AM this morning, was having difficulty forming his words, question of left-sided weakness.  Occipital headache.  Patient took 2 baby aspirin and report to the emergency department for evaluation.  Since the onset of symptoms they have progressively improved.  He is speaking very near to his normal level per the son at bedside.  No longer having left-sided weakness.  No falls or recent head injuries.  No chest pain or dyspnea.  No neck pain.  Of note he has been off of his Plavix around 1 week in preparation for upcoming colonoscopy. ? ? ? ?Past Medical History: ?No date: Adenomatous polyps ?No date: Bronchitis ?2017: CAD in native artery ?No date: Chronic back pain ?No date: GERD (gastroesophageal reflux disease) ?No date: Hyperlipidemia ?No date: Hypertension ?No date: Internal hemorrhoids ?No date: Low testosterone ?No date: Myocardial infarction [facility]) ?No date: Osteopenia ?No date: Shortness of breath dyspnea ?No date: Spondylosis ?2008: TIA (transient ischemic attack) ?No date: Ureterolithiasis ?No date: Ventral hernia ?No date: Vitamin D deficiency ? ?Past Surgical History: ?No date: BACK SURGERY ?    Comment:  2 surgeries ?09/12/2015: CARDIAC CATHETERIZATION; N/A ?    Comment:  Procedure: Left Heart Cath and Coronary Angiography;   ?             Surgeon: Lorretta Harp, MD;  Location: [place] [facility]  ?             LAB;  Service: Cardiovascular;   Laterality: N/A; ?06/23/2019: CORONARY STENT INTERVENTION; N/A ?    Comment:  Procedure: CORONARY STENT INTERVENTION;  Surgeon:  ?             Sherren Mocha, MD;  Location: [place] [facility] LAB;   ?             Service: Cardiovascular;  Laterality: N/A; ?01/27/2018: LEFT HEART CATH AND CORONARY ANGIOGRAPHY; N/A ?    Comment:  Procedure: LEFT HEART CATH AND CORONARY ANGIOGRAPHY;   ?             Surgeon: Jettie Booze, MD;  Location: [place] ?             CV LAB;  Service: Cardiovascular;  Laterality: N/A; ?06/23/2019: LEFT HEART CATH AND CORONARY ANGIOGRAPHY; N/A ?    Comment:  Procedure: LEFT HEART CATH AND CORONARY ANGIOGRAPHY;   ?             Surgeon: Sherren Mocha, MD;  Location: [place] [facility]  ?             LAB;  Service: Cardiovascular;  Laterality: N/A; ?06/24/2020: LEFT HEART CATH AND CORONARY ANGIOGRAPHY; N/A ?    Comment:  Procedure: LEFT HEART CATH AND CORONARY ANGIOGRAPHY;   ?             Surgeon: Troy Sine, MD;  Location: [place] INVASIVE CV  ?  LAB;  Service: Cardiovascular;  Laterality: N/A;  ? ? ?The history is provided by the patient and a relative. No language interpreter was used.  ? ?  ? ?Home Medications ?Prior to Admission medications   ?Medication Sig Start Date End Date Taking? Authorizing Provider  ?amLODipine (NORVASC) 2.5 MG tablet Take 2.5 mg by mouth daily. 08/08/21  Yes [provider]  ?clopidogrel (PLAVIX) 75 MG tablet TAKE 1 TABLET BY MOUTH EVERY DAY ?Patient taking differently: Take 75 mg by mouth daily. 04/05/21  Yes [name], MD  ?nebivolol (BYSTOLIC) 2.5 MG tablet TAKE 1 TABLET BY MOUTH EVERY DAY ?Patient taking differently: Take 2.5 mg by mouth daily. 08/08/21  Yes [name], MD  ?nitroGLYCERIN (NITROSTAT) 0.4 MG SL tablet Place 0.4 mg under the tongue every 5 (five) minutes as needed for chest pain.   Yes [provider]  ?oxyCODONE-acetaminophen (PERCOCET) 10-325 MG tablet Take 1 tablet by mouth every 6 (six) hours as needed for  pain.  05/25/19  Yes [provider]  ?ranolazine (RANEXA) 500 MG 12 hr tablet TAKE 1 TABLET BY MOUTH TWICE A DAY ?Patient taking differently: Take 500 mg by mouth 2 (two) times daily. 08/08/21  Yes [name], MD  ?rosuvastatin (CRESTOR) 5 MG tablet Take 1 tablet (5 mg total) by mouth daily. 08/29/21 11/27/21 Yes [name], MD  ?tamsulosin (FLOMAX) 0.4 MG CAPS capsule Take 1 capsule (0.4 mg total) by mouth daily. ?Patient not taking: Reported on 09/06/2021 06/17/20   [name], MD  ?   ? ?Allergies    ?Lipitor [atorvastatin], Praluent [alirocumab], Crestor [rosuvastatin], Imdur [isosorbide nitrate], Hydrocodone, Levaquin [levofloxacin], Metadate cd [methylphenidate hcl], Morphine and related, and Pravastatin   ? ?Review of Systems   ?Review of Systems  ?Constitutional:  Negative for chills and fever.  ?HENT:  Negative for facial swelling and trouble swallowing.   ?Eyes:  Negative for photophobia and visual disturbance.  ?Respiratory:  Negative for cough and shortness of breath.   ?Cardiovascular:  Negative for chest pain and palpitations.  ?Gastrointestinal:  Negative for abdominal pain, nausea and vomiting.  ?Endocrine: Negative for polydipsia and polyuria.  ?Genitourinary:  Negative for difficulty urinating and hematuria.  ?Musculoskeletal:  Negative for gait problem and joint swelling.  ?Skin:  Negative for pallor and rash.  ?Neurological:  Positive for speech difficulty, weakness and headaches. Negative for syncope.  ?Psychiatric/Behavioral:  Negative for agitation and confusion.   ? ?Physical Exam ?Updated Vital Signs ?BP (!) 142/87   Pulse (!) 59   Temp 98.4 ?F (36.9 ?C) (Oral)   Resp 13   Ht 6' (1.829 m)   Wt 86.6 kg   SpO2 99%   BMI 25.90 kg/m?  ?Physical Exam ?Vitals and nursing note reviewed.  ?Constitutional:   ?   General: He is not in acute distress. ?   Appearance: He is well-developed. He is not ill-appearing.  ?HENT:  ?   Head: Normocephalic and atraumatic.  ?   Right Ear:  External ear normal.  ?   Left Ear: External ear normal.  ?   Mouth/Throat:  ?   Mouth: Mucous membranes are moist.  ?Eyes:  ?   General: No visual field deficit or scleral icterus. ?   Extraocular Movements: Extraocular movements intact.  ?   Pupils: Pupils are equal, round, and reactive to light.  ?Cardiovascular:  ?   Rate and Rhythm: Normal rate and regular rhythm.  ?   Pulses: Normal pulses.     ?  Radial pulses are 2+ on the right side and 2+ on the left side.  ?     Dorsalis pedis pulses are 2+ on the right side and 2+ on the left side.  ?   Heart sounds: Normal heart sounds. Heart sounds not distant.  ?  No S3 or S4 sounds.  ?Pulmonary:  ?   Effort: Pulmonary effort is normal. No respiratory distress.  ?   Breath sounds: Normal breath sounds.  ?Abdominal:  ?   General: Abdomen is flat.  ?   Palpations: Abdomen is soft.  ?   Tenderness: There is no abdominal tenderness. There is no guarding or rebound.  ?Musculoskeletal:     ?   General: Normal range of motion.  ?   Cervical back: Normal range of motion.  ?   Right lower leg: No edema.  ?   Left lower leg: No edema.  ?Skin: ?   General: Skin is warm and dry.  ?   Capillary Refill: Capillary refill takes less than 2 seconds.  ?Neurological:  ?   Mental Status: He is alert and oriented to person, place, and time.  ?   GCS: GCS eye subscore is 4. GCS verbal subscore is 5. GCS motor subscore is 6.  ?   Cranial Nerves: Cranial nerves 2-12 are intact. No dysarthria or facial asymmetry.  ?   Sensory: Sensation is intact.  ?   Motor: Motor function is intact. No weakness or pronator drift.  ?   Coordination: Coordination is intact. Finger-Nose-Finger Test normal.  ?   Comments: Strength 5/5 upper and lower extremities bilateral  ?Psychiatric:     ?   Mood and Affect: Mood normal.     ?   Behavior: Behavior normal.  ? ? ?ED Results / Procedures / Treatments   ?Labs ?(all labs ordered are listed, but only abnormal results are displayed) ?Labs Reviewed  ?CBC -  Abnormal; Notable for the following components:  ?    Result Value  ? RBC 4.19 (*)   ? MCH 34.4 (*)   ? All other components within normal limits  ?COMPREHENSIVE METABOLIC PANEL - Abnormal; Notable for the following compone

## 2021-09-06 NOTE — H&P (Addendum)
?History and Physical  ? ? ?Patient: Johnny [name] XQJ:[id number] DOB: 03-07-1951 ?DOA: 09/06/2021 ?DOS: the patient was seen and examined on 09/06/2021 ?PCP: [name], MD  ?Patient coming from: Home ? ?Chief Complaint:  ?Chief Complaint  ?Patient presents with  ? Aphasia  ? ?HPI: Johnny [name] is a 71 y.o. male with medical history significant of hypertension, hyperlipidemia, CAD, TIA, and or GERD presents with complaints of being unable to speak this morning at around 7 AM when he woke up.  He was last known to be normal around 11:30 PM last night when he bed.  He knew what he wanted to say, but the words were coming out "garbled" when he was trying to talk to his wife.  Symptoms persisted for approximately an hour and he reports that he was given 2 baby aspirin by his son, before symptoms started resolving.  Noted associated symptoms of a bitemporal headache, temporary"sparkling" vision changes, chills(chronic related to being on Plavix), and some mild left hand weakness that has been present for some time thought secondary to rotator cuff issue.  Patient also noted that he had felt his heart racing some prior to going into the MRI machine which he relates to being anxious.  Denies having any significant fever, chest pain, vomiting, or shortness of breath. ? ?Patient presented as a code stroke.  In the emergency department patient was noted to be afebrile with pulse 56-69, and all other vital signs stable.  CT scan of the brain did not note any acute abnormality. Labs were relatively unremarkable.  Neurology had been consulted with recommendations to check CTA of the head and neck along with MRI of the brain.  Patient has been given 1 L normal saline IV fluids. ? ?Review of Systems: As mentioned in the history of present illness. All other systems reviewed and are negative. ?Past Medical History:  ?Diagnosis Date  ? Adenomatous polyps   ? Bronchitis   ? CAD in native artery 2017  ? Chronic back pain   ?  GERD (gastroesophageal reflux disease)   ? Hyperlipidemia   ? Hypertension   ? Internal hemorrhoids   ? Low testosterone   ? Myocardial infarction [facility])   ? Osteopenia   ? Shortness of breath dyspnea   ? Spondylosis   ? TIA (transient ischemic attack) 2008  ? Ureterolithiasis   ? Ventral hernia   ? Vitamin D deficiency   ? ?Past Surgical History:  ?Procedure Laterality Date  ? BACK SURGERY    ? 2 surgeries  ? CARDIAC CATHETERIZATION N/A 09/12/2015  ? Procedure: Left Heart Cath and Coronary Angiography;  Surgeon: Lorretta Harp, MD;  Location: [place] [facility] LAB;  Service: Cardiovascular;  Laterality: N/A;  ? CORONARY STENT INTERVENTION N/A 06/23/2019  ? Procedure: CORONARY STENT INTERVENTION;  Surgeon: Sherren Mocha, MD;  Location: [place] [facility] LAB;  Service: Cardiovascular;  Laterality: N/A;  ? LEFT HEART CATH AND CORONARY ANGIOGRAPHY N/A 01/27/2018  ? Procedure: LEFT HEART CATH AND CORONARY ANGIOGRAPHY;  Surgeon: Jettie Booze, MD;  Location: [place] [facility] LAB;  Service: Cardiovascular;  Laterality: N/A;  ? LEFT HEART CATH AND CORONARY ANGIOGRAPHY N/A 06/23/2019  ? Procedure: LEFT HEART CATH AND CORONARY ANGIOGRAPHY;  Surgeon: Sherren Mocha, MD;  Location: [place] [facility] LAB;  Service: Cardiovascular;  Laterality: N/A;  ? LEFT HEART CATH AND CORONARY ANGIOGRAPHY N/A 06/24/2020  ? Procedure: LEFT HEART CATH AND CORONARY ANGIOGRAPHY;  Surgeon: Troy Sine, MD;  Location: [facility]  INVASIVE CV LAB;  Service: Cardiovascular;  Laterality: N/A;  ? ?Social History:  reports that he quit smoking about 43 years ago. His smoking use included cigarettes. He has never used smokeless tobacco. He reports that he does not drink alcohol and does not use drugs. ? ?Allergies  ?Allergen Reactions  ? Lipitor [Atorvastatin] Other (See Comments)  ?  Myalgia  ? Praluent [Alirocumab] Other (See Comments)  ?  Muscle pain and flu like symptoms  ? Crestor [Rosuvastatin] Other (See Comments)  ?  Myalgia  ? Imdur [Isosorbide Nitrate]  Other (See Comments)  ?  Severe Headaches  ? Hydrocodone Nausea Only  ?  Can take with food  ? Levaquin [Levofloxacin] Nausea And Vomiting  ? Metadate Cd [Methylphenidate Hcl] Other (See Comments)  ?  Burning, GI upset, bloating  ? Morphine And Related Nausea And Vomiting  ? Pravastatin Other (See Comments)  ?  myalgia  ? ? ?Family History  ?Problem Relation Age of Onset  ? Heart disease Mother   ? Stroke Mother   ? Heart attack Mother   ? Emphysema Father   ? ? ?Prior to Admission medications   ?Medication Sig Start Date End Date Taking? Authorizing Provider  ?amLODipine (NORVASC) 2.5 MG tablet Take 2.5 mg by mouth daily. 08/08/21  Yes [provider]  ?clopidogrel (PLAVIX) 75 MG tablet TAKE 1 TABLET BY MOUTH EVERY DAY ?Patient taking differently: Take 75 mg by mouth daily. 04/05/21  Yes [name], MD  ?nebivolol (BYSTOLIC) 2.5 MG tablet TAKE 1 TABLET BY MOUTH EVERY DAY ?Patient taking differently: Take 2.5 mg by mouth daily. 08/08/21  Yes [name], MD  ?nitroGLYCERIN (NITROSTAT) 0.4 MG SL tablet Place 0.4 mg under the tongue every 5 (five) minutes as needed for chest pain.   Yes [provider]  ?oxyCODONE-acetaminophen (PERCOCET) 10-325 MG tablet Take 1 tablet by mouth every 6 (six) hours as needed for pain.  05/25/19  Yes [provider]  ?ranolazine (RANEXA) 500 MG 12 hr tablet TAKE 1 TABLET BY MOUTH TWICE A DAY ?Patient taking differently: Take 500 mg by mouth 2 (two) times daily. 08/08/21  Yes [name], MD  ?rosuvastatin (CRESTOR) 5 MG tablet Take 1 tablet (5 mg total) by mouth daily. 08/29/21 11/27/21 Yes [name], MD  ?tamsulosin (FLOMAX) 0.4 MG CAPS capsule Take 1 capsule (0.4 mg total) by mouth daily. ?Patient not taking: Reported on 09/06/2021 06/17/20   [name], MD  ? ? ?Physical Exam: ?Vitals:  ? 09/06/21 0845 09/06/21 0846 09/06/21 1045 09/06/21 1115  ?BP: 138/70  138/82 135/84  ?Pulse: 65  (!) 59 (!) 56  ?Resp: '14  16 16  '$ ?Temp: 98.4 ?F (36.9 ?C)      ?TempSrc: Oral     ?SpO2: 100%  98% 100%  ?Weight:  86.6 kg    ?Height:  6' (1.829 m)    ? ?Exam ? ?Constitutional: Elderly male NAD, calm, comfortable ?Eyes: PERRL, lids and conjunctivae normal ?ENMT: Mucous membranes are moist. Posterior pharynx clear of any exudate or lesions.  ?Neck: normal, supple, no masses, no thyromegaly ?Respiratory: clear to auscultation bilaterally, no wheezing, no crackles. Normal respiratory effort. No accessory muscle use.  ?Cardiovascular: Regular rate and rhythm, no murmurs / rubs / gallops. No extremity edema. 2+ pedal pulses. No carotid bruits.  ?Abdomen: no tenderness, no masses palpated. No hepatosplenomegaly. Bowel sounds positive.  ?Musculoskeletal: no clubbing / cyanosis. No joint deformity upper and lower extremities. Good ROM, no contractures.  Normal muscle tone.  ?Skin: no rashes, lesions, ulcers. No induration ?Neurologic: CN 2-12 grossly intact. Sensation intact, DTR normal. Strength 5/5 in all 4.  ?Psychiatric: Normal judgment and insight. Alert and oriented x 3. Normal mood.  ? ?Data Reviewed: ? ?Normal sinus rhythm at 64 bpm ? ?Assessment and Plan: ? ?Aphasia  ?Now resolved.  Patient presented with complaints of difficulties speaking this morning at 7 AM when he woke up.  Last known normal last night at 11:30 PM when he went to sleep.  CT scan of the brain negative for any acute abnormalities CTA of the head and neck did not note any large vessel occlusion.  Subsequent MRI did not note any signs of acute stroke.  Hemoglobin A1c 5.1.  Patient was not a candidate for thrombolytics given resolution of symptoms.  Question possibility of TIA versus complex migraine versus seizure. ?-Admit to telemetry bed ?-Stroke order set initiated ?-Neuro checks ?-Check TSH, vitamin B12, RPR, vitamin B 1 in a.m. per neurology recommendation ?-Check EEG ?-Check echocardiogram ?-ASA and plavix for 21 days then Plavix alone ?-Follow-up telemetry overnight ?-Appreciate neurology  consultative services ? ?Headache ?Acute. Patient reported having a bitemporal headache on with reports of temporary visual changes of seeing sparks.  Is prior history of migraine headaches with vision changes in th

## 2021-09-06 NOTE — Progress Notes (Signed)
EEG completed, results pending. 

## 2021-09-06 NOTE — ED Notes (Signed)
Pt rejected by 6n ?

## 2021-09-06 NOTE — ED Notes (Signed)
Headache is a litle better  the pt has migraine headaches ?

## 2021-09-06 NOTE — Progress Notes (Signed)
PT Cancellation Note ? ?Patient Details ?Name: Johnny [name] ?MRN: [id number] ?DOB: 1950-05-23 ? ? ?Cancelled Treatment:    Reason Eval/Treat Not Completed: PT screened, no needs identified, will sign off. Pt and spouse report the patient is back at baseline and decline PT eval at this time. PT will sign off. ? ? ?Zenaida Niece ?09/06/2021, 5:14 PM ?

## 2021-09-07 ENCOUNTER — Other Ambulatory Visit: Payer: Self-pay | Admitting: Neurology

## 2021-09-07 DIAGNOSIS — R4701 Aphasia: Secondary | ICD-10-CM | POA: Diagnosis not present

## 2021-09-07 DIAGNOSIS — G459 Transient cerebral ischemic attack, unspecified: Secondary | ICD-10-CM | POA: Diagnosis not present

## 2021-09-07 DIAGNOSIS — I10 Essential (primary) hypertension: Secondary | ICD-10-CM | POA: Diagnosis not present

## 2021-09-07 DIAGNOSIS — E7800 Pure hypercholesterolemia, unspecified: Secondary | ICD-10-CM | POA: Diagnosis not present

## 2021-09-07 DIAGNOSIS — G43109 Migraine with aura, not intractable, without status migrainosus: Secondary | ICD-10-CM

## 2021-09-07 LAB — HIV ANTIBODY (ROUTINE TESTING W REFLEX): HIV Screen 4th Generation wRfx: NONREACTIVE

## 2021-09-07 LAB — TSH: TSH: 0.418 u[IU]/mL (ref 0.350–4.500)

## 2021-09-07 LAB — RPR: RPR Ser Ql: NONREACTIVE

## 2021-09-07 LAB — VITAMIN B12: Vitamin B-12: 258 pg/mL (ref 180–914)

## 2021-09-07 MED ORDER — ASPIRIN 81 MG PO TBEC: 81.0000 mg | DELAYED_RELEASE_TABLET | Freq: Every day | ORAL | 0 refills | Status: AC

## 2021-09-07 MED ORDER — CYANOCOBALAMIN 500 MCG PO TABS: 500.0000 ug | ORAL_TABLET | Freq: Every day | ORAL | 3 refills | Status: DC

## 2021-09-07 MED ORDER — CLOPIDOGREL BISULFATE 75 MG PO TABS
75.0000 mg | ORAL_TABLET | Freq: Every day | ORAL | 0 refills | Status: DC
Start: 2021-09-08 — End: 2022-01-19

## 2021-09-07 NOTE — Discharge Instructions (Signed)
Follow with Primary MD [name], MD in 7 days  ? ?Get CBC, CMP, checked  by Primary MD next visit.  ? ? ?Activity: As tolerated with Full fall precautions use walker/cane & assistance as needed ? ? ?Disposition Home  ? ? ?Diet: Heart Healthy  , with feeding assistance and aspiration precautions. ? ? ?On your next visit with your primary care physician please Get Medicines reviewed and adjusted. ? ? ?Please request your Prim.MD to go over all Hospital Tests and Procedure/Radiological results at the follow up, please get all Hospital records sent to your Prim MD by signing hospital release before you go home. ? ? ?If you experience worsening of your admission symptoms, develop shortness of breath, life threatening emergency, suicidal or homicidal thoughts you must seek medical attention immediately by calling 911 or calling your MD immediately  if symptoms less severe. ? ?You Must read complete instructions/literature along with all the possible adverse reactions/side effects for all the Medicines you take and that have been prescribed to you. Take any new Medicines after you have completely understood and accpet all the possible adverse reactions/side effects.  ? ?Do not drive, operating heavy machinery, perform activities at heights, swimming or participation in water activities or provide baby sitting services if your were admitted for syncope or siezures until you have seen by Primary MD or a Neurologist and advised to do so again. ? ?Do not drive when taking Pain medications.  ? ? ?Do not take more than prescribed Pain, Sleep and Anxiety Medications ? ?Special Instructions: If you have smoked or chewed Tobacco  in the last 2 yrs please stop smoking, stop any regular Alcohol  and or any Recreational drug use. ? ?Wear Seat belts while driving. ? ? ?Please note ? ?You were cared for by a hospitalist during your hospital stay. If you have any questions about your discharge medications or the care you received  while you were in the hospital after you are discharged, you can call the unit and asked to speak with the hospitalist on call if the hospitalist that took care of you is not available. Once you are discharged, your primary care physician will handle any further medical issues. Please note that NO REFILLS for any discharge medications will be authorized once you are discharged, as it is imperative that you return to your primary care physician (or establish a relationship with a primary care physician if you do not have one) for your aftercare needs so that they can reassess your need for medications and monitor your lab values.  ?

## 2021-09-07 NOTE — Evaluation (Signed)
Speech Language Pathology Evaluation ?Patient Details ?Name: HAKEEM FRAZZINI ?MRN: [id number] ?DOB: 1950/09/27 ?Today's Date: 09/07/2021 ?Time: 1020-1046 ?SLP Time Calculation (min) (ACUTE ONLY): 26 min ? ?Problem List:  ?Patient Active Problem List  ? Diagnosis Date Noted  ? Aphasia 09/06/2021  ? Headache 09/06/2021  ? Statin myopathy 04/05/2021  ? Atelectasis of both lungs 01/26/2018  ? Renal mass 01/26/2018  ? Unstable angina (HCC)   ? TIA (transient ischemic attack) 01/25/2018  ? Exertional dyspnea 09/23/2015  ? CAP (community acquired pneumonia) 09/23/2015  ? Lung nodule 09/23/2015  ? Cough 09/23/2015  ? Pneumonitis   ? Coronary artery disease involving native coronary artery of native heart with unstable angina pectoris ([place])   ? NSTEMI (non-ST elevated myocardial infarction) ([place])   ? Chest pain 09/10/2015  ? 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 Osteopenia 01/20/2012  ? Hx of adenomatous colonic polyps 01/20/2012  ? GE reflux 01/20/2012  ? Vitamin D deficiency 01/20/2012  ? Low serum testosterone level 01/20/2012  ? Erectile dysfunction 01/20/2012  ? Impaired glucose tolerance 01/20/2012  ? Chronic back pain 01/20/2012  ? Depression 02/01/2011  ? ATTENTION OR CONCENTRATION DEFICIT 01/11/2010  ? HYPERCHOLESTEROLEMIA 11/30/2008  ? Essential hypertension 11/30/2008  ? DEGENERATIVE JOINT DISEASE 11/30/2008  ? ?Past Medical History:  ?Past Medical History:  ?Diagnosis Date  ? Adenomatous polyps   ? Bronchitis   ? CAD in native artery 2017  ? Chronic back pain   ? GERD (gastroesophageal reflux disease)   ? Hyperlipidemia   ? Hypertension   ? Internal hemorrhoids   ? Low testosterone   ? Myocardial infarction [facility])   ? Osteopenia   ? Shortness of breath dyspnea   ? Spondylosis   ? TIA (transient ischemic attack) 2008  ? Ureterolithiasis   ? Ventral hernia   ? Vitamin D deficiency   ? ?Past Surgical History:  ?Past Surgical History:  ?Procedure Laterality Date  ? BACK SURGERY    ? 2 surgeries  ? CARDIAC CATHETERIZATION N/A  09/12/2015  ? Procedure: Left Heart Cath and Coronary Angiography;  Surgeon: Lorretta Harp, MD;  Location: [place] [facility];  Service: Cardiovascular;  Laterality: N/A;  ? CORONARY STENT INTERVENTION N/A 06/23/2019  ? Procedure: CORONARY STENT INTERVENTION;  Surgeon: Sherren Mocha, MD;  Location: [place] [facility];  Service: Cardiovascular;  Laterality: N/A;  ? LEFT HEART CATH AND CORONARY ANGIOGRAPHY N/A 01/27/2018  ? Procedure: LEFT HEART CATH AND CORONARY ANGIOGRAPHY;  Surgeon: Jettie Booze, MD;  Location: [place] [facility];  Service: Cardiovascular;  Laterality: N/A;  ? LEFT HEART CATH AND CORONARY ANGIOGRAPHY N/A 06/23/2019  ? Procedure: LEFT HEART CATH AND CORONARY ANGIOGRAPHY;  Surgeon: Sherren Mocha, MD;  Location: [place] [facility];  Service: Cardiovascular;  Laterality: N/A;  ? LEFT HEART CATH AND CORONARY ANGIOGRAPHY N/A 06/24/2020  ? Procedure: LEFT HEART CATH AND CORONARY ANGIOGRAPHY;  Surgeon: Troy Sine, MD;  Location: [place] [facility];  Service: Cardiovascular;  Laterality: N/A;  ? ?HPI:  ?71 y.o. male with medical history significant of hypertension, hyperlipidemia, CAD, TIA, and or GERD presents with complaints of being unable to speak on 12/05/21 when he woke up.  He was last known to be normal around 11:30 PM last night when he bed.  He knew what he wanted to say, but the words were coming out "garbled" when he was trying to talk to his wife.  Symptoms persisted for approximately an hour and he reports that he was given 2  baby aspirin by his son, before symptoms started resolving.  Noted associated symptoms of a bitemporal headache, temporary"sparkling" vision changes, chills(chronic related to being on Plavix), and some mild left hand weakness that has been present for some time thought secondary to rotator cuff issue.  Patient also noted that he had felt his heart racing some prior to going into the MRI machine which he relates to being anxious; MRI/CT head on 09/06/21 negative  for acute processes; pt/wife informed SLP of potential hearing deficit, ADD, "chaos at home d/t 5 grandchildren living with them currently."  ? ?Assessment / Plan / Recommendation ?Clinical Impression ? Pt was seen for a speech/language cognitive evaluation with portions of the SLUMS ([place] mental Status examination) with deficits noted in the areas of memory recall (slight) and sustained attention with functional tasks during assessment, but wife/pt report this is "improved since being in the hospital."  Aphasic symptoms have resolved and speech is 100% intelligible with naming tasks accurate.  Current living situation paired with potential hearing loss, ADD and TIA may be affecting memory/attention at current time.  Pt oriented x4, OME unremarkable.  Recommend f/u with OP SLP and/or neurologist for extended cognitive evaluation if symptoms persist or worsen when d/c.  Thank you for this consult; ST will s/o in acute setting. ?   ?SLP Assessment ? SLP Recommendation/Assessment: All further Speech Lanaguage Pathology  needs can be addressed in the next venue of care ?SLP Visit Diagnosis: Cognitive communication deficit (R41.841)  ?  ?Recommendations for follow up therapy are one component of a multi-disciplinary discharge planning process, led by the attending physician.  Recommendations may be updated based on patient status, additional functional criteria and insurance authorization. ?   ?Follow Up Recommendations ? Other (comment) (potential f/u with neuro/hearing assessment)  ?  ?Assistance Recommended at Discharge ? Intermittent Supervision/Assistance  ?Functional Status Assessment Patient has had a recent decline in their functional status and demonstrates the ability to make significant improvements in function in a reasonable and predictable amount of time.  ?Frequency and Duration    ? Evaluation only ?  ?   ?SLP Evaluation ?Cognition ? Overall Cognitive Status: History of cognitive  impairments - at baseline ?Arousal/Alertness: Awake/alert ?Orientation Level: Oriented X4 ?Year: 2023 ?Month: April ?Day of Week: Correct ?Attention: Sustained ?Sustained Attention: Appears intact ?Memory: Impaired ?Memory Impairment: Decreased recall of new information ?Awareness: Appears intact ?Problem Solving: Appears intact ?Behaviors: Restless ?Safety/Judgment: Appears intact  ?  ?   ?Comprehension ? Auditory Comprehension ?Overall Auditory Comprehension: Appears within functional limits for tasks assessed ?Yes/No Questions: Within Functional Limits ?Commands: Within Functional Limits ?Conversation: Complex ?Interfering Components: Attention ?EffectiveTechniques: Repetition ?Visual Recognition/Discrimination ?Discrimination: Within Function Limits ?Reading Comprehension ?Reading Status: Within funtional limits  ?  ?Expression Expression ?Primary Mode of Expression: Verbal ?Verbal Expression ?Overall Verbal Expression: Appears within functional limits for tasks assessed ?Initiation: No impairment ?Level of Generative/Spontaneous Verbalization: Conversation ?Repetition: No impairment ?Naming: No impairment ?Pragmatics: No impairment ?Non-Verbal Means of Communication: Not applicable ?Written Expression ?Written Expression: Not tested   ?Oral / Motor ? Oral Motor/Sensory Function ?Overall Oral Motor/Sensory Function: Within functional limits ?Motor Speech ?Overall Motor Speech: Appears within functional limits for tasks assessed ?Respiration: Within functional limits ?Phonation: Normal ?Resonance: Within functional limits ?Articulation: Within functional limitis ?Intelligibility: Intelligible ?Motor Planning: Witnin functional limits ?Motor Speech Errors: Not applicable   ?        ? ?Elvina Sidle, M.S., CCC-SLP ?09/07/2021, 11:28 AM ? ?

## 2021-09-07 NOTE — TOC Transition Note (Signed)
Transition of Care (TOC) - CM/SW Discharge Note ? ? ?Patient Details  ?Name: Johnny [name] ?MRN: [id number] ?Date of Birth: 1951-03-24 ? ?Transition of Care (TOC) CM/SW Contact:  ?Pollie Friar, RN ?Phone Number: ?09/07/2021, 1:49 PM ? ? ?Clinical Narrative:    ?Patient is discharging home today. No f/u per PT/OT/ST. Pt doesn't use any DME at home. Patient denies any issues with home medications or transportation.  ?Wife is at the bedside and can provide transport home.  ? ? ?Final next level of care: Home/Self Care ?Barriers to Discharge: No Barriers Identified ? ? ?Patient Goals and CMS Choice ?  ?  ?  ? ?Discharge Placement ?  ?           ?  ?  ?  ?  ? ?Discharge Plan and Services ?  ?  ?           ?  ?  ?  ?  ?  ?  ?  ?  ?  ?  ? ?Social Determinants of Health (SDOH) Interventions ?  ? ? ?Readmission Risk Interventions ?   ? View : No data to display.  ?  ?  ?  ? ? ? ? ? ?

## 2021-09-07 NOTE — Progress Notes (Signed)
STROKE TEAM PROGRESS NOTE  ? ?SUBJECTIVE (INTERVAL HISTORY) ?His wife is at the bedside.  Overall his condition is completely resolved.  Per patient and wife, patient has history of migraine with aura, more with visual aura in the past.  However in 2008 he did have migraine aura with speech difficulty.  He follows with Dr. Krista Blue at [facility] for migraine.  Yesterday she had episode of expressive aphasia lasting about 1 hour.  Shortly after speech difficulty patient started have headache which lasted 2 hours.  No symptoms all resolved. ? ?OBJECTIVE ?Temp:  [97.4 ?F (36.3 ?C)-97.9 ?F (36.6 ?C)] 97.7 ?F (36.5 ?C) (04/20 1518) ?Pulse Rate:  [52-69] 69 (04/20 1518) ?Cardiac Rhythm: Sinus bradycardia (04/20 0753) ?Resp:  [11-20] 20 (04/20 1518) ?BP: (107-152)/(74-89) 137/75 (04/20 1518) ?SpO2:  [96 %-100 %] 100 % (04/20 1518) ?Weight:  [86.6 kg] 86.6 kg (04/20 0543) ? ?No results for input(s): GLUCAP in the last 168 hours. ?Recent Labs  ?Lab 09/06/21 ?3299 09/06/21 ?2426  ?NA 137 138  ?K 4.2 4.0  ?CL 105 104  ?CO2 24  --   ?GLUCOSE 112* 107*  ?BUN 10 10  ?CREATININE 1.19 1.20  ?CALCIUM 9.8  --   ? ?Recent Labs  ?Lab 09/06/21 ?8341  ?AST 20  ?ALT 18  ?ALKPHOS 39  ?BILITOT 1.8*  ?PROT 6.6  ?ALBUMIN 4.0  ? ?Recent Labs  ?Lab 09/06/21 ?9622 09/06/21 ?2979  ?WBC 5.6  --   ?NEUTROABS 3.0  --   ?HGB 14.4 14.6  ?HCT 41.8 43.0  ?MCV 99.8  --   ?PLT 191  --   ? ?No results for input(s): CKTOTAL, CKMB, CKMBINDEX, TROPONINI in the last 168 hours. ?Recent Labs  ?  09/06/21 ?0850  ?LABPROT 13.7  ?INR 1.1  ? ?No results for input(s): COLORURINE, LABSPEC, [place], GLUCOSEU, HGBUR, BILIRUBINUR, KETONESUR, PROTEINUR, UROBILINOGEN, NITRITE, LEUKOCYTESUR in the last 72 hours. ? ?Invalid input(s): APPERANCEUR  ?   ?Component Value Date/Time  ? CHOL 168 09/06/2021 1230  ? CHOL 135 01/19/2020 0808  ? TRIG 164 (H) 09/06/2021 1230  ? HDL 38 (L) 09/06/2021 1230  ? HDL 44 01/19/2020 0808  ? CHOLHDL 4.4 09/06/2021 1230  ? VLDL 33 09/06/2021 1230  ? [place]  97 09/06/2021 1230  ? [place] 140 (H) 07/07/2021 8921  ? ?Lab Results  ?Component Value Date  ? HGBA1C 5.1 09/06/2021  ? ?   ?Component Value Date/Time  ? LABOPIA POSITIVE (A) 01/26/2018 0311  ? [place] DETECTED 01/26/2018 0311  ? [place] DETECTED 01/26/2018 0311  ? AMPHETMU NONE DETECTED 01/26/2018 0311  ? [place] DETECTED 01/26/2018 0311  ? [place] DETECTED 01/26/2018 0311  ?  ?No results for input(s): ETH in the last 168 hours. ? ?I have personally reviewed the radiological images below and agree with the radiology interpretations. ? ?CT ANGIO HEAD NECK W WO CM ? ?Result Date: 09/06/2021 ?CLINICAL DATA:  Word-finding difficulty this morning EXAM: CT ANGIOGRAPHY HEAD AND NECK TECHNIQUE: Multidetector CT imaging of the head and neck was performed using the standard protocol during bolus administration of intravenous contrast. Multiplanar CT image reconstructions and MIPs were obtained to evaluate the vascular anatomy. Carotid stenosis measurements (when applicable) are obtained utilizing NASCET criteria, using the distal internal carotid diameter as the denominator. RADIATION DOSE REDUCTION: This exam was performed according to the departmental dose-optimization program which includes automated exposure control, adjustment of the mA and/or kV according to patient size and/or use of iterative reconstruction technique. CONTRAST:  32m OMNIPAQUE  IOHEXOL 350 MG/ML SOLN COMPARISON:  Same-day noncontrast CT head, brain MRI 07/01/2014 FINDINGS: CTA NECK FINDINGS Aortic arch: There is mild calcified atherosclerotic plaque in the imaged aortic arch. The origins of the major branch vessels are patent. The subclavian arteries are patent to the level imaged. Right carotid system: The right common, internal, and external carotid arteries are patent. There is mild calcified plaque in the proximal right internal carotid artery without hemodynamically significant stenosis. The right internal carotid artery is  tortuous. There is no dissection or aneurysm. Left carotid system: The left common, internal, and external carotid arteries are patent, without hemodynamically significant stenosis or occlusion. There is mild plaque at the bifurcation. There is no dissection or aneurysm. Vertebral arteries: The vertebral arteries are patent, without hemodynamically significant stenosis or occlusion. There is no dissection or aneurysm. Skeleton: There is multilevel degenerative change of the cervical spine at C3-C4 through C6-C7. There is mild anterior compression deformity of the T3 vertebral body, unchanged since the CT chest from 08/02/2020. There is no acute osseous abnormality or suspicious osseous lesion. There is no visible canal hematoma. Other neck: The soft tissues are unremarkable. Upper chest: There is marked emphysema in the lung apices. Review of the MIP images confirms the above findings CTA HEAD FINDINGS Anterior circulation: There is mild calcified plaque in the intracranial ICAs without hemodynamically significant stenosis. The bilateral MCAs are patent. The left A1 segment is hypoplastic, a developmental variant. Otherwise, the ACAs are patent. The anterior communicating artery is normal. There is a 2 mm x 1 mm superiorly directed outpouching arising from the distal right M1 segment which could reflect a small aneurysm or infundibulum (10-108). There is no other aneurysm. There is no AVM. Posterior circulation: The V 4 segments are patent with mild focal plaque on the left. A linear filling defect in the left V4 segment is favored to be artifactual (9-136, 10-139). The basilar artery is patent. The bilateral PCAs are patent with atherosclerotic irregularity resulting in up to moderate stenosis of the right P2 segment (10-141). The right posterior communicating artery is identified. The left posterior communicating artery is not definitely seen. There is no aneurysm or AVM. Venous sinuses: Patent. Anatomic variants:  None. Review of the MIP images confirms the above findings IMPRESSION: 1. No emergent large vessel occlusion. 2. Mild calcified plaque in the proximal internal carotid arteries without hemodynamically significant stenosis or occlusion. Patent vertebral arteries. 3. Mild atherosclerotic plaque in the intracranial ICAs without hemodynamically significant stenosis. Atherosclerotic irregularity in the right PCA resulting in up to moderate stenosis of the P2 segment. Otherwise, patent intracranial vasculature. 4. 1-2 mm superiorly directed outpouching arising from the distal right M1 segment suspicious for small aneurysm or infundibulum. Aortic Atherosclerosis (ICD10-I70.0) and Emphysema (ICD10-J43.9). Electronically Signed   By: Valetta Mole M.D.   On: 09/06/2021 13:21  ? ?CT HEAD WO CONTRAST ? ?Result Date: 09/06/2021 ?CLINICAL DATA:  71 year old male with history of acute onset of neurologic deficit. Aphasia. EXAM: CT HEAD WITHOUT CONTRAST TECHNIQUE: Contiguous axial images were obtained from the base of the skull through the vertex without intravenous contrast. RADIATION DOSE REDUCTION: This exam was performed according to the departmental dose-optimization program which includes automated exposure control, adjustment of the mA and/or kV according to patient size and/or use of iterative reconstruction technique. COMPARISON:  Head CT 04/23/2020. FINDINGS: Brain: Mild cerebral atrophy. Patchy and confluent areas of decreased attenuation are noted throughout the deep and periventricular white matter of the cerebral hemispheres bilaterally, compatible with  chronic microvascular ischemic disease. No evidence of acute infarction, hemorrhage, hydrocephalus, extra-axial collection or mass lesion/mass effect. Vascular: No hyperdense vessel or unexpected calcification. Skull: Normal. Negative for fracture or focal lesion. Sinuses/Orbits: No acute finding. Other: None. IMPRESSION: 1. No acute intracranial abnormalities. 2. Mild  cerebral atrophy with chronic microvascular ischemic changes in the cerebral white matter, as above. Electronically Signed   By: Vinnie Langton M.D.   On: 09/06/2021 09:55  ? ?MR BRAIN WO CONTRAST

## 2021-09-07 NOTE — ED Notes (Signed)
The pts headache is better ?

## 2021-09-07 NOTE — Care Management Obs Status (Signed)
MEDICARE OBSERVATION STATUS NOTIFICATION ? ? ?Patient Details  ?Name: Johnny [name] ?MRN: [id number] ?Date of Birth: 1950/10/15 ? ? ?Medicare Observation Status Notification Given:  Yes ? ?Telephone consent permission to sign by Wife Johnny [name] ? ?Verdell Carmine, RN ?09/07/2021, 1:25 PM ?

## 2021-09-07 NOTE — Evaluation (Signed)
Occupational Therapy Evaluation ?Patient Details ?Name: Johnny [name] ?MRN: [id number] ?DOB: 1950-09-11 ?Today's Date: 09/07/2021 ? ? ?History of Present Illness 71 y.o. male presents [facility] hospital on [facility]2021 after experiencing HA and speech impairments, symptoms resolved by time of arrival to ED. CT and MRI negative. PMH includes HTN, HLD, CAD s/p MI, migraines, sciatica.  ? ?Clinical Impression ?  ?Pt admitted for concerns listed above. PTA pt reported that he was independent with all ADL's and IADL's, including driving. At this time, pt presents at his baseline, able to complete ADL's and functional mobility independently with no AD. Vision and cognition WFL. Pt has no further OT needs and acute OT will sign off.  ?   ? ?Recommendations for follow up therapy are one component of a multi-disciplinary discharge planning process, led by the attending physician.  Recommendations may be updated based on patient status, additional functional criteria and insurance authorization.  ? ?Follow Up Recommendations ? No OT follow up  ?  ?Assistance Recommended at Discharge None  ?Patient can return home with the following   ? ?  ?Functional Status Assessment ? Patient has had a recent decline in their functional status and demonstrates the ability to make significant improvements in function in a reasonable and predictable amount of time.  ?Equipment Recommendations ? None recommended by OT  ?  ?Recommendations for Other Services   ? ? ?  ?Precautions / Restrictions Precautions ?Precautions: None ?Restrictions ?Weight Bearing Restrictions: No  ? ?  ? ?Mobility Bed Mobility ?Overal bed mobility: Modified Independent ?  ?  ?  ?  ?  ?  ?  ?  ? ?Transfers ?Overall transfer level: Modified independent ?Equipment used: None ?  ?  ?  ?  ?  ?  ?  ?  ?  ? ?  ?Balance Overall balance assessment: No apparent balance deficits (not formally assessed) ?  ?  ?  ?  ?  ?  ?  ?  ?  ?  ?  ?  ?  ?  ?  ?  ?  ?  ?   ? ?ADL either performed or  assessed with clinical judgement  ? ?ADL Overall ADL's : At baseline;Modified independent ?  ?  ?  ?  ?  ?  ?  ?  ?  ?  ?  ?  ?  ?  ?  ?  ?  ?  ?  ?General ADL Comments: No difficulties  ? ? ? ?Vision Baseline Vision/History: 0 No visual deficits ?Ability to See in Adequate Light: 0 Adequate ?Patient Visual Report: No change from baseline ?Vision Assessment?: No apparent visual deficits  ?   ?Perception   ?  ?Praxis   ?  ? ?Pertinent Vitals/Pain Pain Assessment ?Pain Assessment: No/denies pain  ? ? ? ?Hand Dominance Right ?  ?Extremity/Trunk Assessment Upper Extremity Assessment ?Upper Extremity Assessment: Overall WFL for tasks assessed ?  ?Lower Extremity Assessment ?Lower Extremity Assessment: Overall WFL for tasks assessed ?  ?Cervical / Trunk Assessment ?Cervical / Trunk Assessment: Normal ?  ?Communication Communication ?Communication: No difficulties ?  ?Cognition Arousal/Alertness: Awake/alert ?Behavior During Therapy: [facility] for tasks assessed/performed ?Overall Cognitive Status: Within Functional Limits for tasks assessed ?  ?  ?  ?  ?  ?  ?  ?  ?  ?  ?  ?  ?  ?  ?  ?  ?  ?  ?  ?General Comments  VSS on RA ? ?  ?  Exercises   ?  ?Shoulder Instructions    ? ? ?Home Living Family/patient expects to be discharged to:: Private residence ?Living Arrangements: Spouse/significant other ?Available Help at Discharge: Family;Available 24 hours/day ?Type of Home: House ?Home Access: Stairs to enter ?Entrance Stairs-Number of Steps: 3 steps ?Entrance Stairs-Rails: None ?Home Layout: Two level ?Alternate Level Stairs-Number of Steps: flight ?Alternate Level Stairs-Rails: Right ?Bathroom Shower/Tub: Walk-in shower ?  ?Bathroom Toilet: Handicapped height ?  ?  ?Home Equipment: Conservation officer, nature (2 wheels);Shower seat - built in ?  ?  ? Lives With: Spouse;Family ? ?  ?Prior Functioning/Environment Prior Level of Function : Independent/Modified Independent ?  ?  ?  ?  ?  ?  ?  ?  ?  ? ?  ?  ?OT Problem List: Decreased  strength;Decreased activity tolerance;Impaired balance (sitting and/or standing) ?  ?   ?OT Treatment/Interventions:    ?  ?OT Goals(Current goals can be found in the care plan section) Acute Rehab OT Goals ?Patient Stated Goal: To go home ?OT Goal Formulation: With patient ?Time For Goal Achievement: 09/07/21 ?Potential to Achieve Goals: Good  ?OT Frequency:   ?  ? ?Co-evaluation   ?  ?  ?  ?  ? ?  ?AM-PAC OT "6 Clicks" Daily Activity     ?Outcome Measure Help from another person eating meals?: None ?Help from another person taking care of personal grooming?: None ?Help from another person toileting, which includes using toliet, bedpan, or urinal?: None ?Help from another person bathing (including washing, rinsing, drying)?: None ?Help from another person to put on and taking off regular upper body clothing?: None ?Help from another person to put on and taking off regular lower body clothing?: None ?6 Click Score: 24 ?  ?End of Session Equipment Utilized During Treatment: Gait belt ?Nurse Communication: Mobility status ? ?Activity Tolerance: Patient tolerated treatment well ?Patient left: in bed;with call bell/phone within reach;with family/visitor present ? ?OT Visit Diagnosis: Unsteadiness on feet (R26.81);Other abnormalities of gait and mobility (R26.89);Muscle weakness (generalized) (M62.81)  ?              ?Time: 5188-4166 ?OT Time Calculation (min): 14 min ?Charges:  OT General Charges ?$OT Visit: 1 Visit ?OT Evaluation ?$OT Eval Moderate Complexity: 1 Mod ? ?Dvante Hands H., OTR/L ?Acute Rehabilitation ? ?Jomarion Mish Elane Yolanda Bonine ?09/07/2021, 12:12 PM ?

## 2021-09-07 NOTE — Discharge Summary (Signed)
Physician Discharge Summary  ?Johnny [name] JAS:[id number] DOB: Sep 18, 1950 DOA: 09/06/2021 ? ?PCP: [name], MD ? ?Admit date: 09/06/2021 ?Discharge date: 09/07/2021 ? ?Admitted From:Home ?Disposition:  Home ? ?Recommendations for Outpatient Follow-up:  ?Follow up with PCP in 1-2 weeks ?Recheck B12 level and 4 to 6 weeks after oral supplements, if remain low then likely will need IM supplements. ?Please follow up with your primary neurologist within 4 weeks ? ?Home Health:NO ? ? ?Discharge Condition:Stable ?CODE STATUS:FULL ?Diet recommendation: Heart Healthy  ? ? ? ?Brief/Interim Summary: ? ?Johnny [name] is a 71 y.o. male with medical history significant of hypertension, hyperlipidemia, CAD, TIA, and or GERD presents with complaints of being unable to speak this morning at around 7 AM when he woke up.  He was last known to be normal around 11:30 PM last night when he bed.  He knew what he wanted to say, but the words were coming out "garbled" when he was trying to talk to his wife.  Symptoms persisted for approximately an hour and he reports that he was given 2 baby aspirin by his son, before symptoms started resolving.  Noted associated symptoms of a bitemporal headache, temporary"sparkling" vision changes, chills(chronic related to being on Plavix), and some mild left hand weakness that has been present for some time thought secondary to rotator cuff issue.  Patient also noted that he had felt his heart racing some prior to going into the MRI machine which he relates to being anxious.  Denies having any significant fever, chest pain, vomiting, or shortness of breath. ?  ?Patient presented as a code stroke.  In the emergency department patient was noted to be afebrile with pulse 56-69, and all other vital signs stable.  CT scan of the brain did not note any acute abnormality. Labs were relatively unremarkable.  Neurology had been consulted with recommendations to check CTA of the head and neck along with  MRI of the brain.  Patient has been given 1 L normal saline IV fluids. ? ? 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 ?-MRI brain with no acute infarction, CTA head and neck with no evidence of LVO, but significant for mild atherosclerosis disease, the echo with a preserved EF 55 to 60% with no IAS/shunts A1c is acceptable at 5.1, LDL elevated at 97. ?-Recommendation per neurology to continue with aspirin and Plavix x21 days, then Plavix alone ?-To follow-up with his primary neurologist as an outpatient ? ?Hypertension ?-Continue with home medications ? ?Hyperlipidemia ?-LDL elevated at 97, but has significant statin intolerance, so we will continue with home dose ? ?B12 level is borderline low at 258, he will be discharged on oral supplements, if remains low after oral supplement and likely will need IM supplements. ? ? ?Discharge Diagnoses:  ?Principal Problem: ?  Aphasia ?Active Problems: ?  HYPERCHOLESTEROLEMIA ?  Essential hypertension ?  Headache ? ? ? ?Discharge Instructions ? ?Discharge Instructions   ? ? Diet - low sodium heart healthy   Complete by: As directed ?  ? Discharge instructions   Complete by: As directed ?  ? Follow with Primary MD [name], MD in 7 days  ? ?Get CBC, CMP, checked  by Primary MD next visit.  ? ? ?Activity: As tolerated with Full fall precautions use walker/cane & assistance as needed ? ? ?  Disposition Home  ? ? ?Diet: Heart Healthy  , with feeding assistance and aspiration precautions. ? ? ?On your next visit with your primary care physician please Get Medicines reviewed and adjusted. ? ? ?Please request your Prim.MD to go over all Hospital Tests and Procedure/Radiological results at the follow up, please get all Hospital records sent to your Prim MD by signing hospital release before you  go home. ? ? ?If you experience worsening of your admission symptoms, develop shortness of breath, life threatening emergency, suicidal or homicidal thoughts you must seek medical attention immediately by calling 911 or calling your MD immediately  if symptoms less severe. ? ?You Must read complete instructions/literature along with all the possible adverse reactions/side effects for all the Medicines you take and that have been prescribed to you. Take any new Medicines after you have completely understood and accpet all the possible adverse reactions/side effects.  ? ?Do not drive, operating heavy machinery, perform activities at heights, swimming or participation in water activities or provide baby sitting services if your were admitted for syncope or siezures until you have seen by Primary MD or a Neurologist and advised to do so again. ? ?Do not drive when taking Pain medications.  ? ? ?Do not take more than prescribed Pain, Sleep and Anxiety Medications ? ?Special Instructions: If you have smoked or chewed Tobacco  in the last 2 yrs please stop smoking, stop any regular Alcohol  and or any Recreational drug use. ? ?Wear Seat belts while driving. ? ? ?Please note ? ?You were cared for by a hospitalist during your hospital stay. If you have any questions about your discharge medications or the care you received while you were in the hospital after you are discharged, you can call the unit and asked to speak with the hospitalist on call if the hospitalist that took care of you is not available. Once you are discharged, your primary care physician will handle any further medical issues. Please note that NO REFILLS for any discharge medications will be authorized once you are discharged, as it is imperative that you return to your primary care physician (or establish a relationship with a primary care physician if you do not have one) for your aftercare needs so that they can reassess your need for medications and  monitor your lab values.  ? Increase activity slowly   Complete by: As directed ?  ? ?  ? ?Allergies as of 09/07/2021   ? ?   Reactions  ? Lipitor [atorvastatin] Other (See Comments)  ? Myalgia  ? Praluent [alirocumab] Other (See Comments)  ? Muscle pain and flu like symptoms  ? Crestor [rosuvastatin] Other (See Comments)  ? Myalgia  ? Imdur [isosorbide Nitrate] Other (See Comments)  ? Severe Headaches  ? Hydrocodone Nausea Only  ? Can take with food  ? Levaquin [levofloxacin] Nausea And Vomiting  ? Metadate Cd [methylphenidate Hcl] Other (See Comments)  ? Burning, GI upset, bloating  ? Morphine And Related Nausea And Vomiting  ? Pravastatin Other (See Comments)  ? myalgia  ? ?  ? ?  ?Medication List  ?  ? ?TAKE these medications   ? ?aspirin 81 MG EC tablet ?Take 1 tablet (81 mg total) by mouth daily for 21 days. Swallow whole.  Take for 21 days and then stop ?Start taking on: September 08, 2021 ?  ?clopidogrel 75 MG tablet ?Commonly known as: PLAVIX ?TAKE 1 TABLET BY MOUTH EVERY DAY ?  ?nebivolol 2.5 MG  tablet ?Commonly known as: BYSTOLIC ?TAKE 1 TABLET BY MOUTH EVERY DAY ?  ?nitroGLYCERIN 0.4 MG SL tablet ?Commonly known as: NITROSTAT ?Place 0.4 mg under the tongue every 5 (five) minutes as needed for chest pain. ?  ?oxyCODONE-acetaminophen 10-325 MG tablet ?Commonly known as: PERCOCET ?Take 1 tablet by mouth every 6 (six) hours as needed for pain. ?  ?ranolazine 500 MG 12 hr tablet ?Commonly known as: RANEXA ?TAKE 1 TABLET BY MOUTH TWICE A DAY ?  ?rosuvastatin 5 MG tablet ?Commonly known as: CRESTOR ?Take 1 tablet (5 mg total) by mouth daily. ?  ?vitamin B-12 500 MCG tablet ?Commonly known as: CYANOCOBALAMIN ?Take 1 tablet (500 mcg total) by mouth daily. ?  ? ?  ? ? ?Allergies  ?Allergen Reactions  ? Lipitor [Atorvastatin] Other (See Comments)  ?  Myalgia  ? Praluent [Alirocumab] Other (See Comments)  ?  Muscle pain and flu like symptoms  ? Crestor [Rosuvastatin] Other (See Comments)  ?  Myalgia  ? Imdur [Isosorbide  Nitrate] Other (See Comments)  ?  Severe Headaches  ? Hydrocodone Nausea Only  ?  Can take with food  ? Levaquin [Levofloxacin] Nausea And Vomiting  ? Metadate Cd [Methylphenidate Hcl] Other (See Comments

## 2021-09-08 ENCOUNTER — Telehealth: Payer: Self-pay

## 2021-09-08 NOTE — Telephone Encounter (Signed)
Transition Care Management Follow-up Telephone Call ?Date of discharge and from where: Johnny [name] 09/07/21 ?How have you been since you were released from the hospital? Doing well ?Any questions or concerns? No ? ?Items Reviewed: ?Did the pt receive and understand the discharge instructions provided? Yes  ?Medications obtained and verified? Yes  ?Other?  N/a ?Any new allergies since your discharge? No  ?Dietary orders reviewed? No ?Do you have support at home? Yes  ? ?Home Care and Equipment/Supplies: ?Were home health services ordered? no ?If so, what is the name of the agency? N/a  ?Has the agency set up a time to come to the patient's home? not applicable ?Were any new equipment or medical supplies ordered?  No ?What is the name of the medical supply agency? N/a ?Were you able to get the supplies/equipment? not applicable ?Do you have any questions related to the use of the equipment or supplies? No ? ?Functional Questionnaire: (I = Independent and D = Dependent) ?ADLs: I ? ?Bathing/Dressing- I ? ?Meal Prep- I ? ?Eating- I ? ?Maintaining continence- I ? ?Transferring/Ambulation- I ? ?Managing Meds- I ? ?Follow up appointments reviewed: ? ?PCP Hospital f/u appt confirmed? Yes  Scheduled to see dR bAXLEY on 4/28 @ 12. ?Specialist Hospital f/u appt confirmed?  nOT SCHEDULED YET [place] WILL BE SEEING nEUROLOGIST.    ?Are transportation arrangements needed? No  ?If their condition worsens, is the pt aware to call PCP or go to the Emergency Dept.? Yes ?Was the patient provided with contact information for the PCP's office or ED? Yes ?Was to pt encouraged to call back with questions or concerns? Yes  ?

## 2021-09-11 LAB — VITAMIN B1: Vitamin B1 (Thiamine): 184 nmol/L (ref 66.5–200.0)

## 2021-09-15 ENCOUNTER — Ambulatory Visit: Payer: Medicare Other | Admitting: Internal Medicine

## 2021-09-15 ENCOUNTER — Encounter: Payer: Self-pay | Admitting: Internal Medicine

## 2021-09-15 VITALS — BP 128/80 | HR 60 | Temp 98.8°F | Ht 72.0 in | Wt 191.8 lb

## 2021-09-15 DIAGNOSIS — I252 Old myocardial infarction: Secondary | ICD-10-CM

## 2021-09-15 DIAGNOSIS — Z86010 Personal history of colonic polyps: Secondary | ICD-10-CM

## 2021-09-15 DIAGNOSIS — I25118 Atherosclerotic heart disease of native coronary artery with other forms of angina pectoris: Secondary | ICD-10-CM

## 2021-09-15 DIAGNOSIS — K2100 Gastro-esophageal reflux disease with esophagitis, without bleeding: Secondary | ICD-10-CM | POA: Diagnosis not present

## 2021-09-15 DIAGNOSIS — I10 Essential (primary) hypertension: Secondary | ICD-10-CM

## 2021-09-15 DIAGNOSIS — G43109 Migraine with aura, not intractable, without status migrainosus: Secondary | ICD-10-CM | POA: Diagnosis not present

## 2021-09-15 DIAGNOSIS — E782 Mixed hyperlipidemia: Secondary | ICD-10-CM | POA: Diagnosis not present

## 2021-09-15 DIAGNOSIS — Z8639 Personal history of other endocrine, nutritional and metabolic disease: Secondary | ICD-10-CM

## 2021-09-15 DIAGNOSIS — Z860101 Personal history of adenomatous and serrated colon polyps: Secondary | ICD-10-CM

## 2021-09-15 NOTE — Patient Instructions (Addendum)
RTC here in 4 weeks for B12 and OV the following day. Continue taking B12 supplement orally OTC.  These are recommendations per Hospital Neurologist ? ?Hospital Neurologist requested that  see outpatient Neurologist for follow up of complicated migraine.  He has seen Dr. Krista Blue in the past ? ?History of elevated serum calcium but recent calcium level in hospital was normal.  Previously we made Endocrinology referral as we could not get a parathyroid nuclear medicine study approved this past February. ? ?Continue Cardiac medications per [facility]. ? ?Endoscopy/colonoscopy has been postponed as he is currently on Plavix.  Discharge summary says he is to take aspirin daily for 21 days. ? ? ?

## 2021-09-15 NOTE — Progress Notes (Signed)
? ?Subjective:  ? ? Patient ID: Johnny [name], male    DOB: 19-Feb-1951, 71 y.o.   MRN: [id number] ? ?HPI 71 year old Male seen for follow up on complicated migraine requiring recent admission and evaluated by Dr. Erlinda Hong, [facility] Neurologist. Currently on Plavix for 21 days. ? ?Patient has a history of hypertension, hyperlipidemia, coronary artery disease , history of TIA.  He awakened around 7 AM on April 19.  He said he knew what he wanted to say but the words were coming out "garbled" when he was trying to speak.  His son gave him 2 baby aspirin tablets.  He also noted a bitemporal headache and some sparkling vision changes.  After presenting to the emergency department he noticed some tachycardia going into the MRI machine. ? ?He presented as a code stroke.  Pulse was 56-69 and vital signs were stable.  CT scan did not show any acute abnormality of the brain.  Labs were basically unremarkable.  Patient had CTA of head and neck.  There was a mildly calcified atherosclerotic plaque in the imaged aortic arch.  Mild calcified plaque in the proximal right internal carotid artery without hemodynamically significant stenosis.  Left carotid system no dissection or aneurysm.  Vertebral arteries were patent without significant stenosis or occlusion.  No aneurysm or dissection. ? ?Patient had mild anterior compression deformity of T3 vertebral body unchanged since CT of chest March 2022.  Multilevel degenerative changes of the C-spine C3-C4 and C6-C7. ? ?Bilateral MCAs were patent.  Anterior circulation showed mildly calcified plaque in the intracranial ICAs without hemodynamically significant stenosis. ? ?Left A1 segment was hyperplastic thought to be due to developmental variant.  Anterior communicating artery was normal.  There was a 2 mm x 1 mm superiorly directed outpouching arising from the distal right M1 segment which was thought to reflect a small aneurysm or infundibulum. ? ?Posterior circulation V4 segments were  patent with mild focal plaque on the left.  A linear filling defect in the left V4 segment was favored to be an artifact.  Basilar artery was patent. ? ?Bilateral PCAs were patent with atherosclerosis irregularity resulting in up to moderate stenosis of the right P2 segment.  Right posterior communicating artery was identified at left posterior communicating artery not definitely seen.  No aneurysm or AVM. ? ?He remains on generic Plavix 75 mg daily and 81 mg of aspirin daily.  He is on Crestor 5 mg daily per cardiologist as well as generic Bystolic 2.5 mg every day.  He also takes Ranexa 500 mg every 12 hours. ? ?He currently is taking over-the-counter B12 supplement 500 mcg daily. ? ?He is scheduled to have an endoscopy and colonoscopy with Dr. Michail Sermon in the near future but anticoagulation will need to be stopped prior to the procedure.  This had been scheduled for April 25 but now has been deferred after recent hospitalization. ? ?Patient stopped amlodipine last year because of fatigue and low blood pressure.  He stopped Crestor because of muscle aches. ? ?Apparently Dr. Kathyrn Sheriff wants to do MRI of his spine. ? ?History of coronary artery disease status post remote MI, DES to the circumflex x 2  in February 2021.  Had catheterization February 2022 with patent circumflex but moderate to severe distal and small branch vessel disease.  Medical therapy recommended.  He could not tolerate high-dose ranolazine.  History of hypertension, hyperlipidemia, TIA and history of pulmonary nodules. ? ?His cardiologist is Dr. [name]. ? ?Family history includes emphysema  in father, MI in mother.  Stroke in mother. ? ?Social history: He is married.  His son was involved in a serious motor vehicle accident which took over a year to recover.  His wife has multiple sclerosis and is disabled.  He is a retired Agricultural consultant.  He does not smoke.  Very occasional alcohol consumption. ? ? ? ?Has seen Dr. Krista Blue in 2016 and will be referred back  to her. ? ?Had elevated  calcium in the past.  Parathyroid hormone level was 55 in February 2023.  Calcium was 10.8 at the time.   A parathyroid hormone assay has been ordered in Feb 2023 but has not been done. ? ?He has a history of coronary artery disease and was admitted to the hospital June 24, 2020 with chest pain and had a left heart cath by Dr. Claiborne Billings.  MI was ruled out. ? ?Review of Systems see above ? ?   ?Objective:  ? Physical Exam ?Blood pressure 128/80 pulse 60 regular temperature 98.8 degrees pulse oximetry 98% weight 191 pounds 12 ounces.  BMI 26.01 ? ?Skin: Warm and dry.  No carotid bruits.  Chest is clear.  Cardiac exam: Regular rate and rhythm.  Affect thought and judgment appear to be normal.  No gross focal deficits on brief neurological exam today. ? ? ? ?   ?Assessment & Plan:  ?History of complicated migraine requiring hospitalization.  Stroke ruled out.  Needs outpatient Neurology follow-up.  Has seen Dr. Krista Blue in the past. ? ?Low normal B12 level-has been placed on oral supplement during hospitalization and will need follow-up in a few weeks.  Level was 258 during hospitalization. ? ?History of coronary artery disease-seen by [facility].   ? ?Hyperlipidemia treated with rosuvastatin 5 mg daily but stopped this due to muscle aches. ? ?History of chronic musculoskeletal pain seen at [facility]. ? ?Lumbar radiculopathy being evaluated by Dr. Kathyrn Sheriff ? ?Elevated serum calcium-level was 10.8 in February 2023.  PTH was normal at 55.  Nuclear medicine study ordered however I think there was an issue ?getting the study approved.  We  made Endocrinology referral in mid March.  This has not been scheduled yet.  However, his serum calcium during recent hospitalization was normal at 9.8 ? ?Plan: His outpatient Neurologist is Dr. Krista Blue and he will be referred back to her regarding complicated migraine.  He will be on Plavix and has been advised to take aspirin for 21 days  postdischarge.  We will check his B12 level in 4 weeks.  He is to keep taking oral B12 supplement. ?

## 2021-10-02 ENCOUNTER — Other Ambulatory Visit: Payer: Self-pay

## 2021-10-02 ENCOUNTER — Emergency Department (HOSPITAL_COMMUNITY): Payer: Medicare Other

## 2021-10-02 ENCOUNTER — Emergency Department (HOSPITAL_COMMUNITY)
Admission: EM | Admit: 2021-10-02 | Discharge: 2021-10-02 | Disposition: A | Discharge status: Home / Self Care | Payer: Medicare Other | Attending: Emergency Medicine | Admitting: Emergency Medicine

## 2021-10-02 ENCOUNTER — Encounter (HOSPITAL_COMMUNITY): Payer: Self-pay | Admitting: Emergency Medicine

## 2021-10-02 DIAGNOSIS — Z7902 Long term (current) use of antithrombotics/antiplatelets: Secondary | ICD-10-CM | POA: Insufficient documentation

## 2021-10-02 DIAGNOSIS — R202 Paresthesia of skin: Secondary | ICD-10-CM | POA: Insufficient documentation

## 2021-10-02 DIAGNOSIS — Z79899 Other long term (current) drug therapy: Secondary | ICD-10-CM | POA: Insufficient documentation

## 2021-10-02 DIAGNOSIS — R0789 Other chest pain: Secondary | ICD-10-CM | POA: Diagnosis not present

## 2021-10-02 DIAGNOSIS — K219 Gastro-esophageal reflux disease without esophagitis: Secondary | ICD-10-CM | POA: Insufficient documentation

## 2021-10-02 DIAGNOSIS — R001 Bradycardia, unspecified: Secondary | ICD-10-CM | POA: Diagnosis not present

## 2021-10-02 DIAGNOSIS — R079 Chest pain, unspecified: Secondary | ICD-10-CM

## 2021-10-02 DIAGNOSIS — R0602 Shortness of breath: Secondary | ICD-10-CM | POA: Diagnosis not present

## 2021-10-02 DIAGNOSIS — I25118 Atherosclerotic heart disease of native coronary artery with other forms of angina pectoris: Secondary | ICD-10-CM | POA: Diagnosis not present

## 2021-10-02 DIAGNOSIS — I10 Essential (primary) hypertension: Secondary | ICD-10-CM | POA: Diagnosis not present

## 2021-10-02 DIAGNOSIS — I2510 Atherosclerotic heart disease of native coronary artery without angina pectoris: Secondary | ICD-10-CM | POA: Insufficient documentation

## 2021-10-02 LAB — CBC WITH DIFFERENTIAL/PLATELET
Abs Immature Granulocytes: 0.03 10*3/uL (ref 0.00–0.07)
Basophils Absolute: 0 10*3/uL (ref 0.0–0.1)
Basophils Relative: 1 %
Eosinophils Absolute: 0.1 10*3/uL (ref 0.0–0.5)
Eosinophils Relative: 2 %
HCT: 38.5 % — ABNORMAL LOW (ref 39.0–52.0)
Hemoglobin: 13.2 g/dL (ref 13.0–17.0)
Immature Granulocytes: 1 %
Lymphocytes Relative: 34 %
Lymphs Abs: 2.1 10*3/uL (ref 0.7–4.0)
MCH: 34.4 pg — ABNORMAL HIGH (ref 26.0–34.0)
MCHC: 34.3 g/dL (ref 30.0–36.0)
MCV: 100.3 fL — ABNORMAL HIGH (ref 80.0–100.0)
Monocytes Absolute: 0.6 10*3/uL (ref 0.1–1.0)
Monocytes Relative: 11 %
Neutro Abs: 3.2 10*3/uL (ref 1.7–7.7)
Neutrophils Relative %: 51 %
Platelets: 190 10*3/uL (ref 150–400)
RBC: 3.84 MIL/uL — ABNORMAL LOW (ref 4.22–5.81)
RDW: 12.1 % (ref 11.5–15.5)
WBC: 6.1 10*3/uL (ref 4.0–10.5)
nRBC: 0 % (ref 0.0–0.2)

## 2021-10-02 LAB — BASIC METABOLIC PANEL
Anion gap: 7 (ref 5–15)
BUN: 10 mg/dL (ref 8–23)
CO2: 24 mmol/L (ref 22–32)
Calcium: 9.5 mg/dL (ref 8.9–10.3)
Chloride: 105 mmol/L (ref 98–111)
Creatinine, Ser: 1.24 mg/dL (ref 0.61–1.24)
GFR, Estimated: >60 mL/min (ref >60)
Glucose, Bld: 91 mg/dL (ref 70–99)
Potassium: 4 mmol/L (ref 3.5–5.1)
Sodium: 136 mmol/L (ref 135–145)

## 2021-10-02 LAB — TROPONIN I (HIGH SENSITIVITY)
Troponin I (High Sensitivity): 4 ng/L (ref <18)
Troponin I (High Sensitivity): 4 ng/L (ref <18)

## 2021-10-02 MED ORDER — ALUM & MAG HYDROXIDE-SIMETH 200-200-20 MG/5ML PO SUSP
30.0000 mL | Freq: Once | ORAL | Status: AC
  Administered 2021-10-02: 30 mL via ORAL
  Filled 2021-10-02: qty 30

## 2021-10-02 MED ORDER — NITROGLYCERIN 0.4 MG SL SUBL: 0.4000 mg | SUBLINGUAL_TABLET | SUBLINGUAL | 0 refills | Status: DC | PRN

## 2021-10-02 MED ORDER — PANTOPRAZOLE SODIUM 40 MG PO TBEC: 40.0000 mg | DELAYED_RELEASE_TABLET | Freq: Every day | ORAL | 0 refills | Status: DC

## 2021-10-02 MED ORDER — NITROGLYCERIN 0.4 MG SL SUBL
0.4000 mg | SUBLINGUAL_TABLET | Freq: Once | SUBLINGUAL | Status: AC
  Administered 2021-10-02: 0.4 mg via SUBLINGUAL
  Filled 2021-10-02: qty 1

## 2021-10-02 MED ORDER — LIDOCAINE VISCOUS HCL 2 % MT SOLN
15.0000 mL | Freq: Once | OROMUCOSAL | Status: AC
  Administered 2021-10-02: 15 mL via ORAL
  Filled 2021-10-02: qty 15

## 2021-10-02 NOTE — Discharge Instructions (Signed)
Your evaluated in the emergency room.  Your work-up was overall reassuring.  You received nitro and GI cocktail in the emergency room.  You reported improvement following both however more with the GI cocktail.  You also saw cardiology in the emergency room.  They recommended starting you on acid reflux medication as a believe this may be related to acid reflux.  Continue taking your other medications as directed.  If you have any worsening in your symptoms please return to the emergency room without hesitation.  Otherwise cardiology clinic will schedule a follow-up appointment with you in the clinic.  If you do not hear from them in 2 to 3 days please give their clinic a call to schedule this. ?

## 2021-10-02 NOTE — ED Triage Notes (Signed)
[place], started having chest pain, heavy pressure in nature.  Patient took two nitros which knocked the chest pain from 9/10 to 4/10. Patient did take a '324mg'$  baby ASA. Some mild shortness of breath.  No nausea, no vomiting.  Patient does have GERD, did eat chips and salsa last night.   ?

## 2021-10-02 NOTE — ED Provider Triage Note (Signed)
Emergency Medicine Provider Triage Evaluation Note ? ?Johnny [name] , a 71 y.o. male  was evaluated in triage.  Pt complains of chest pain that began at 2am while sitting up on side of bed.  Some SOB initially but better now.  No diaphoresis, pain to neck/left arm, dizziness, weakness.  Took to 2 SL NTG and 324 ASA. Pain initially 9/10 and down to 3-4/10 after meds.  Followed by cardiology, Dr. [name]. Hx  DES x2 to LCx. ? ?Review of Systems  ?Positive: Chest pain ?Negative: fever ? ?Physical Exam  ?BP 111/71   Pulse (!) 50   Temp 97.6 ?F (36.4 ?C) (Oral)   Resp 16   SpO2 100%  ? ?Gen:   Awake, no distress   ?Resp:  Normal effort  ?MSK:   Moves extremities without difficulty  ?Other:   ? ?Medical Decision Making  ?Medically screening exam initiated at 5:52 AM.  Appropriate orders placed.  Johnny [name] was informed that the remainder of the evaluation will be completed by another provider, this initial triage assessment does not replace that evaluation, and the importance of remaining in the ED until their evaluation is complete. ? ?Chest pain.  Does have known cardiac hx.  VSS in triage.  EKG, labs, CXR. ?  ?Larene Pickett, PA-C ?10/02/21 1497 ? ?

## 2021-10-02 NOTE — Consult Note (Addendum)
?Cardiology Consultation:  ? ?Patient ID: Johnny [name] ?MRN: [id number]; DOB: 1950-08-12 ? ?Admit date: 10/02/2021 ?Date of Consult: 10/02/2021 ? ?PCP:  [name], MD ?  ?[facility] Providers ?Cardiologist:  [name], MD  ? ?Patient Profile:  ? ?Johnny [name] is a 71 y.o. 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 Regenia Skeeter. ? ?History of Present Illness:  ? ?Johnny [name] is a 71 year old male with the above history who is followed by Dr. [name]. Patient has a history of CAD with remote MI. He was admitted in 06/2019 with chest pain and was found to have severe mid to distal LCX disease which was treated with overlapping DES x2. He was also noted to have residual non-obstructive disease of the LAD and RCA/PDA. Last cath in 06/2020 showed  patent stent to th LCX with severe diffuse disease in small caliber distal vessels including the distal LAD, 1st Diag, OM1, and lateral branch of OM2. Continued medical therapy was recommended at that time. Antianginals have been limited due to migraines with long acting nitrates, fatigue with Amlodipine, and inability to tolerate higher doses of Ranexa. Patient was last seen by Ermalinda Barrios, PA-C in 06/2021 at which time he was doing well from a cardiac standpoint with no chest pain or dyspnea.  ? ?Patient was recently admitted in 10/7122 with complicated migraine vs TIA after presenting with aphasia. Brain MRI showed no acute infarct. Head/neck CTA showed moderate stenosis of the right PCA in the P2 segment but otherwise was unremarkabe (no emergent large vessel occlusion). Echo showed LVEF of 55-60% with normal wall motion and mild LVH. Patient was seen by Neurology who felt aphasia was more likely due to complicated migraine but TIA could not be ruled out. Patient was restarted  on DAPT with Aspirin and Plavix for 3 weeks with then plans to continue Plavix alone. ? ?Patient presents to the ED today for further evaluation of chest pain.  Patient states he was in his usual state of health until this morning around 2:30 AM when he developed sudden onset central/left-sided chest pain that radiated straight to his back as well as to his left shoulder and arm  He had some very mild shortness of breath and nausea associated with this but no diaphoresis.  He reports this felt like his prior cardiac pain that he has had in the past. He does have a history of reflux though and his wife states he was eating salsa late last night around 10:30 to 11 PM.  The pain lasted for couple of hours.  He went to nearby fire station where they checked his vitals and reportedly did a twelve-lead EKG which was unremarkable.  He states he then went home and took 6 baby Aspirin as well as his Ranexa, Bystolic, and sublingual Nitroglycerin with no improvement.  This was around 4 AM.  Given persistent pain, he ultimately decided to come to the ED where he was given another dose of sublingual nitroglycerin as well as a GI cocktail.  Wife states she thinks he had the most improvement with a GI cocktail.  He has chronic dyspnea on exertion but this is not new and is stable.  No orthopnea, PND, lower extremity edema.  He notes occasional palpitations that he describes as a "fluttering" sensation but no  sustained or prolonged palpitations.  No lightheadedness, dizziness, syncope.  He notes occasional productive cough due to seasonal allergies but no other recent fevers or illnesses.  He struggles with intermittent constipation and will sometimes notice very mild bright red blood in his stools when he is very constipated. Otherwise,no abnormal bleeding in urine or stools. ? ?Upon arrival to the ED, patient mildly bradycardic but otherwise vitals stable. EKG showed sins bradycardia, rate 51 bpm, with no acute ST/T changes.  High-sensitivity troponin negative x2 (4 >> 4). WBC 6.1, Hgb 13.2, Plts 190. Na 136, K 4.0, Glucose 91, BUN 10, Cr 1.24.  Cardiology consulted for further evaluation. ? ?At time of this evaluation, patient is resting comfortably no acute distress.  He continues to have mild chest discomfort which he ranks as a 3/10 on the pain scale.  This is improved from a 9/10 which she was having earlier. Wife reports most improvement after GI cocktail. Patient states he felt like his reflux was worse while on the 3 weeks of Aspirin after recent admission for complicated migraine vs TIA. ? ? report that they think he had the most ?Past Medical History:  ?Diagnosis Date  ? Adenomatous polyps   ? Bronchitis   ? CAD in native artery 2017  ? Chronic back pain   ? GERD (gastroesophageal reflux disease)   ? Hyperlipidemia   ? Hypertension   ? Internal hemorrhoids   ? Low testosterone   ? Myocardial infarction [facility])   ? Osteopenia   ? Shortness of breath dyspnea   ? Spondylosis   ? TIA (transient ischemic attack) 2008  ? Ureterolithiasis   ? Ventral hernia   ? Vitamin D deficiency   ? ? ?Past Surgical History:  ?Procedure Laterality Date  ? BACK SURGERY    ? 2 surgeries  ? CARDIAC CATHETERIZATION N/A 09/12/2015  ? Procedure: Left Heart Cath and Coronary Angiography;  Surgeon: Lorretta Harp, MD;  Location: [place] [facility];  Service: Cardiovascular;  Laterality: N/A;  ? CORONARY STENT INTERVENTION N/A 06/23/2019  ? Procedure: CORONARY STENT INTERVENTION;  Surgeon: Sherren Mocha, MD;  Location: [place] [facility];  Service: Cardiovascular;  Laterality: N/A;  ? LEFT HEART CATH AND CORONARY ANGIOGRAPHY N/A 01/27/2018  ? Procedure: LEFT HEART CATH AND CORONARY ANGIOGRAPHY;  Surgeon: Jettie Booze, MD;  Location: [place] [facility];  Service: Cardiovascular;  Laterality: N/A;  ? LEFT HEART CATH AND CORONARY ANGIOGRAPHY N/A 06/23/2019  ? Procedure: LEFT HEART CATH AND CORONARY ANGIOGRAPHY;  Surgeon: Sherren Mocha, MD;  Location: [place] [facility];  Service: Cardiovascular;  Laterality: N/A;  ? LEFT HEART CATH AND CORONARY ANGIOGRAPHY N/A 06/24/2020  ? Procedure: LEFT HEART CATH AND CORONARY ANGIOGRAPHY;  Surgeon: Troy Sine, MD;  Location: [place] [facility];  Service: Cardiovascular;  Laterality: N/A;  ?  ? ?Home Medications:  ?Prior to Admission medications   ?Medication Sig Start Date End Date Taking? Authorizing Provider  ?clopidogrel (PLAVIX) 75 MG tablet Take 1 tablet (75 mg total) by mouth daily. 09/08/21   Elgergawy, Silver Huguenin, MD  ?nebivolol (BYSTOLIC) 2.5 MG tablet TAKE 1 TABLET BY MOUTH EVERY DAY ?Patient taking differently: Take 2.5 mg by mouth daily. 08/08/21   [name], MD  ?nitroGLYCERIN (NITROSTAT) 0.4 MG SL tablet Place 0.4 mg under the tongue every 5 (five) minutes as needed for chest pain.    [provider]  ?oxyCODONE-acetaminophen (PERCOCET) 10-325 MG tablet Take 1 tablet by mouth every 6 (six)  hours as needed for pain.  05/25/19   [provider]  ?ranolazine (RANEXA) 500 MG 12 hr tablet TAKE 1 TABLET BY MOUTH TWICE A DAY ?Patient taking differently: Take 500 mg by mouth 2 (two) times daily. 08/08/21   [name], MD  ?rosuvastatin (CRESTOR) 5 MG tablet Take 1 tablet (5 mg total) by mouth daily. 08/29/21 11/27/21  [name], MD  ?vitamin B-12 (CYANOCOBALAMIN) 500 MCG tablet Take 1 tablet (500 mcg total) by mouth daily. 09/07/21   Elgergawy, Silver Huguenin, MD  ? ? ?Inpatient Medications: ?Scheduled Meds: ? ?Continuous Infusions: ? ?PRN Meds: ? ? ?Allergies:    ?Allergies  ?Allergen Reactions  ? Lipitor [Atorvastatin] Other (See Comments)  ?  Myalgia  ? Praluent [Alirocumab] Other (See Comments)  ?  Muscle pain and flu like symptoms  ? Crestor [Rosuvastatin] Other (See Comments)  ?  Myalgia  ? Imdur [Isosorbide Nitrate] Other (See Comments)  ?  Severe Headaches  ? Hydrocodone Nausea Only  ?  Can take with food  ? Levaquin [Levofloxacin] Nausea And Vomiting  ? Metadate Cd [Methylphenidate Hcl]  Other (See Comments)  ?  Burning, GI upset, bloating  ? Morphine And Related Nausea And Vomiting  ? Pravastatin Other (See Comments)  ?  myalgia  ? ? ?Social History:   ?Social History  ? ?Socioeconomic Hist

## 2021-10-02 NOTE — ED Provider Notes (Signed)
[facility] EMERGENCY DEPARTMENT Provider Note   CSN: [id number] Arrival date & time: 10/02/21  0551     History  Chief Complaint  Patient presents with   Chest Pain    Johnny [name] is a 71 y.o. male.  71 year old male presents today for evaluation of chest pain that came on last night.  Patient has history of CAD and is status post PCI.  He was recently admitted February 2023 and underwent catheterization.  No amenable PCI targets.  He was medically managed.  Reports until today he had done fairly well without many anginal episodes.  He endorses mild shortness of breath, paresthesias to his left upper extremity.  He denies diaphoresis, palpitations, nausea or vomiting.  He is without PND, or peripheral edema.  Denies leg pain, or recent travel.  He does have history of GERD however he states this feels similar to his previous MI in 2015 as opposed to his acid reflux episode.  The history is provided by the patient, medical records and the spouse. No language interpreter was used.      Home Medications Prior to Admission medications   Medication Sig Start Date End Date Taking? Authorizing Provider  clopidogrel (PLAVIX) 75 MG tablet Take 1 tablet (75 mg total) by mouth daily. 09/08/21   Elgergawy, Silver Huguenin, MD  nebivolol (BYSTOLIC) 2.5 MG tablet TAKE 1 TABLET BY MOUTH EVERY DAY Patient taking differently: Take 2.5 mg by mouth daily. 08/08/21   [name], MD  nitroGLYCERIN (NITROSTAT) 0.4 MG SL tablet Place 0.4 mg under the tongue every 5 (five) minutes as needed for chest pain.    [provider]  oxyCODONE-acetaminophen (PERCOCET) 10-325 MG tablet Take 1 tablet by mouth every 6 (six) hours as needed for pain.  05/25/19   [provider]  ranolazine (RANEXA) 500 MG 12 hr tablet TAKE 1 TABLET BY MOUTH TWICE A DAY Patient taking differently: Take 500 mg by mouth 2 (two) times daily. 08/08/21   [name], MD  rosuvastatin (CRESTOR) 5 MG  tablet Take 1 tablet (5 mg total) by mouth daily. 08/29/21 11/27/21  [name], MD  vitamin B-12 (CYANOCOBALAMIN) 500 MCG tablet Take 1 tablet (500 mcg total) by mouth daily. 09/07/21   Elgergawy, Silver Huguenin, MD      Allergies    Lipitor [atorvastatin], Praluent [alirocumab], Crestor [rosuvastatin], Imdur [isosorbide nitrate], Hydrocodone, Levaquin [levofloxacin], Metadate cd [methylphenidate hcl], Morphine and related, and Pravastatin    Review of Systems   Review of Systems  Constitutional:  Negative for chills and fever.  Respiratory:  Positive for shortness of breath.   Cardiovascular:  Positive for chest pain. Negative for palpitations and leg swelling.  Gastrointestinal:  Negative for abdominal pain.  All other systems reviewed and are negative.  Physical Exam Updated Vital Signs BP 117/74 (BP Location: Right Arm)   Pulse (!) 49   Temp 97.6 F (36.4 C) (Oral)   Resp 16   SpO2 100%  Physical Exam Vitals and nursing note reviewed.  Constitutional:      General: He is not in acute distress.    Appearance: Normal appearance. He is not ill-appearing.  HENT:     Head: Normocephalic and atraumatic.     Nose: Nose normal.  Eyes:     General: No scleral icterus.    Extraocular Movements: Extraocular movements intact.     Conjunctiva/sclera: Conjunctivae normal.  Cardiovascular:     Rate and Rhythm: Regular rhythm. Bradycardia present.  Pulses: Normal pulses.     Heart sounds: Normal heart sounds.  Pulmonary:     Effort: Pulmonary effort is normal. No respiratory distress.     Breath sounds: Normal breath sounds. No wheezing or rales.  Abdominal:     General: There is no distension.     Palpations: Abdomen is soft.     Tenderness: There is no abdominal tenderness. There is no guarding.  Musculoskeletal:        General: Normal range of motion.     Cervical back: Normal range of motion.     Right lower leg: No edema.     Left lower leg: No edema.  Skin:    General:  Skin is warm and dry.  Neurological:     General: No focal deficit present.     Mental Status: He is alert. Mental status is at baseline.    ED Results / Procedures / Treatments   Labs (all labs ordered are listed, but only abnormal results are displayed) Labs Reviewed  CBC WITH DIFFERENTIAL/PLATELET - Abnormal; Notable for the following components:      Result Value   RBC 3.84 (*)    HCT 38.5 (*)    MCV 100.3 (*)    MCH 34.4 (*)    All other components within normal limits  BASIC METABOLIC PANEL  TROPONIN I (HIGH SENSITIVITY)  TROPONIN I (HIGH SENSITIVITY)    EKG EKG Interpretation  Date/Time:  Monday Oct 02 2021 07:38:55 EDT Ventricular Rate:  51 PR Interval:  156 QRS Duration: 100 QT Interval:  452 QTC Calculation: 417 R Axis:   68 Text Interpretation: Sinus bradycardia no acute ST/T changes similar to April 2023 Confirmed by Sherwood Gambler [phone]) on 10/02/2021 7:43:27 AM  Radiology DG Chest 2 View  Result Date: 10/02/2021 CLINICAL DATA:  71 year old male with history of chest pain. EXAM: CHEST - 2 VIEW COMPARISON:  Chest x-ray 06/24/2020. FINDINGS: There are some linear opacities throughout the lung bases bilaterally, very similar to the prior study from 06/24/2020, likely to reflect areas of chronic post infectious or inflammatory scarring or areas of mild fibrosis. No acute consolidative airspace disease. Emphysematous changes are noted throughout the lungs bilaterally. No pleural effusions. No pneumothorax. No definite suspicious appearing pulmonary nodules or masses are noted. Pulmonary vasculature is normal. Heart size is normal. Upper mediastinal contours are within normal limits. Atherosclerotic calcifications are noted in the thoracic aorta. IMPRESSION: 1. No radiographic evidence of acute cardiopulmonary disease. 2. Areas of fibrosis or chronic post infectious or inflammatory scarring again noted throughout the lung bases bilaterally, similar to the prior study. 3.  Emphysema. 4. Aortic atherosclerosis. Electronically Signed   By: Vinnie Langton M.D.   On: 10/02/2021 06:15    Procedures Procedures    Medications Ordered in ED Medications - No data to display  ED Course/ Medical Decision Making/ A&P Clinical Course as of 10/02/21 1517  Mon Oct 02, 2021  0819 Patient reports his chest pain is returning.  Will provide dose of nitroglycerin.  Initial troponin of 4.  Chest x-ray without acute cardiopulmonary process.  CBC without leukocytosis or anemia.  BMP unremarkable.  EKG without acute ischemic changes. [AA]  [id number] Discussed with cardiology. They will evaluate patient.  [AA]    Clinical Course User Index [AA] Evlyn Courier, PA-C                           Medical Decision Making Risk OTC drugs.  Prescription drug management.   Medical Decision Making / ED Course   This patient presents to the ED for concern of chest pain, this involves an extensive number of treatment options, and is a complaint that carries with it a high risk of complications and morbidity.  The differential diagnosis includes ACS, pneumonia, PE, GERD  MDM: 71 year old male with past medical history significant for CAD with recent admission undergoing cardiac catheterization without amenable PCI targets being managed medically.  He presents today due to episode of chest pain with shortness of breath that started last night that has improved after taking 2 doses of nitro, 324 mg of aspirin.  He also took his regular daily medications including Ranexa.  Currently reports pain level that is 3/10.  Pain is exertional.  He is well-appearing on exam.  EKG without acute ischemic changes.   GI cocktail given with some improvement.  However he remains with 3/10 chest pain that is radiating to his left upper extremity.  Cardiology evaluated patient and deemed that this is not ACS and is GI related.  Troponin negative x2.  He has ambulated multiple times to the bathroom without worsening of  his symptoms.  Patient does have history of acid reflux and is not compliant with his PPI.  He uses it only as needed.  Cardiology feels patient is appropriate for discharge with clinic follow-up.  They recommend starting patient on PPI.  Will prescribe patient Protonix.  Strict return precautions given the patient.  Both patient and wife voiced understanding and are in agreement to be discharged.    Additional history obtained: -Additional history obtained from recent admission where patient underwent catheterization confirming patient has multivessel disease without PCI targets.  Patient has been maximized on his medical therapy. -External records from outside source obtained and reviewed including: Chart review including previous notes, labs, imaging, consultation notes   Lab Tests: -I ordered, reviewed, and interpreted labs.   The pertinent results include:   Labs Reviewed  CBC WITH DIFFERENTIAL/PLATELET - Abnormal; Notable for the following components:      Result Value   RBC 3.84 (*)    HCT 38.5 (*)    MCV 100.3 (*)    MCH 34.4 (*)    All other components within normal limits  BASIC METABOLIC PANEL  TROPONIN I (HIGH SENSITIVITY)  TROPONIN I (HIGH SENSITIVITY)      EKG  EKG Interpretation  Date/Time:  Monday Oct 02 2021 07:38:55 EDT Ventricular Rate:  51 PR Interval:  156 QRS Duration: 100 QT Interval:  452 QTC Calculation: 417 R Axis:   68 Text Interpretation: Sinus bradycardia no acute ST/T changes similar to April 2023 Confirmed by Sherwood Gambler [phone]) on 10/02/2021 7:43:27 AM         Imaging Studies ordered: I ordered imaging studies including chest x-ray I independently visualized and interpreted imaging. I agree with the radiologist interpretation   Medicines ordered and prescription drug management: Meds ordered this encounter  Medications   nitroGLYCERIN (NITROSTAT) SL tablet 0.4 mg    -I have reviewed the patients home medicines and have made  adjustments as needed  Cardiac Monitoring: The patient was maintained on a cardiac monitor.  I personally viewed and interpreted the cardiac monitored which showed an underlying rhythm of: Sinus bradycardia  Reevaluation: After the interventions noted above, I reevaluated the patient and found that they have :improved  Co morbidities that complicate the patient evaluation  Past Medical History:  Diagnosis Date   Adenomatous polyps  Bronchitis    CAD in native artery 2017   Chronic back pain    GERD (gastroesophageal reflux disease)    Hyperlipidemia    Hypertension    Internal hemorrhoids    Low testosterone    Myocardial infarction (HCC)    Osteopenia    Shortness of breath dyspnea    Spondylosis    TIA (transient ischemic attack) 2008   Ureterolithiasis    Ventral hernia    Vitamin D deficiency       Dispostion: Patient is appropriate for discharge.  Discharged in stable condition.  Return precautions discussed.  Patient and wife voiced understanding and are in agreement with plan.  Patient states his current nitro that he has at home is due to expire soon, and would like a refill.  Final Clinical Impression(s) / ED Diagnoses Final diagnoses:  Atypical chest pain  Gastroesophageal reflux disease, unspecified whether esophagitis present    Rx / DC Orders ED Discharge Orders          Ordered    pantoprazole (PROTONIX) 40 MG tablet  Daily        10/02/21 1438    nitroGLYCERIN (NITROSTAT) 0.4 MG SL tablet  Every 5 min PRN        10/02/21 1438              Evlyn Courier, PA-C 10/02/21 1521    Sherwood Gambler, MD 10/05/21 1719

## 2021-10-03 ENCOUNTER — Telehealth: Payer: Self-pay | Admitting: Internal Medicine

## 2021-10-03 NOTE — Telephone Encounter (Signed)
Faxed ED notes to Dr Joette Catching office [phone] ?

## 2021-10-03 NOTE — Telephone Encounter (Signed)
This message was sent via [employer], a product from [employer] http://www.[employer].com/ ? ?                  -------Fax Transmission Report------- ? ?To:               Recipient at [phone] ?Subject:          FW: Hp Scans ?Result:           The transmission was successful. ?Explanation:      All Pages Ok ?Pages Sent:       11 ?Connect Time:     5 minutes, 17 seconds ?Transmit Time:    10/03/2021 09:14 ?Transfer Rate:    14400 ?Status Code:      0000 ?Retry Count:      2 ?Job Id:           [id number] ?Unique Id:        [id number] ?Fax Line:         8 ?Fax Server:       [email or web address] ? ? ?

## 2021-10-12 ENCOUNTER — Other Ambulatory Visit: Payer: Medicare Other

## 2021-10-12 DIAGNOSIS — E538 Deficiency of other specified B group vitamins: Secondary | ICD-10-CM

## 2021-10-13 LAB — VITAMIN B12: Vitamin B-12: 472 pg/mL (ref 200–1100)

## 2021-10-17 ENCOUNTER — Ambulatory Visit: Payer: Medicare Other | Admitting: Internal Medicine

## 2021-10-17 ENCOUNTER — Encounter: Payer: Self-pay | Admitting: Internal Medicine

## 2021-10-17 VITALS — BP 126/80 | HR 57 | Temp 98.1°F | Ht 72.0 in | Wt 192.0 lb

## 2021-10-17 DIAGNOSIS — E538 Deficiency of other specified B group vitamins: Secondary | ICD-10-CM | POA: Diagnosis not present

## 2021-10-17 NOTE — Patient Instructions (Addendum)
Continue oral B12 supplement.  Keep appointment with endocrinology regarding mild hypercalcemia in November.  Follow-up with cardiology.  He will be seeing Dr. Annamaria Boots, neurologist soon for follow-up on complicated migraine.  We will repeat B12 level in September.  Start taking prescription PPI.

## 2021-10-17 NOTE — Progress Notes (Signed)
Subjective:    Patient ID: Johnny [name], male    DOB: [birth year], 71 y.o.   MRN: [id number]  HPI  72 year old Male seen for B12 deficiency.Now on oral B12 supplement. Level has improved from 258 to 472. Likely can absorb B12 with this improvement. We will ask him to continue OTC supplement and follow up in 3-4 months.  Seeing Dr. Krista Blue on June 1st to follow up on recent hospitalization.  He was admitted on April 19 with garbled speech.  His son gave him 2 baby aspirin tablets.  He had a bitemporal headache and some sparkling vision changes.  He has a history of hypertension, hyperlipidemia, coronary artery disease, history of TIA.  He presented as a code stroke with stable vital signs.  CT scan did not show acute abnormality of the brain and labs were unremarkable.  He had CTA of the head and neck.  There was a mildly calcified atherosclerotic plaque in the imaged aortic arch.  He had a mild calcified plaque in the proximal right internal carotid artery without hemodynamically significant stenosis.  Left carotid system showed no dissection or aneurysm.  Vertebral arteries were patent without significant stenosis or occlusion.  No aneurysm or dissection.  Bilateral MCAs were patent.  Anterior circulation showed mildly calcified plaque in the intracranial ICAs without hemodynamically significant stenosis.  Left A1 segment was hyperplastic thought to be due to developmental variant.  Anterior communicating artery was normal.  There was a 2 mm x 1 mm superiorly directed outpouching arising from the distal right M1 segment which was thought to reflect a small aneurysm or infundibulum.  Bilateral PCAs were patent with atherosclerosis irregularity resulting in up to moderate stenosis of the right P2 segment.  Right posterior communicating artery was identified and the left posterior communicating artery not definitely seen.  Posterior circulation V4 segments were patent with mild focal plaque on the left.  The  linear filling defect in the left V4 segment was favored to be an artifact.  Basilar artery was patent.  He remains on Plavix 75 mg daily and 81 mg of aspirin.  He is on Crestor 5 mg daily per cardiologist as well as generic Bystolic 2.5 mg daily.  He also takes Ranexa 500 mg every 12 hours.  He has upcoming cardiology appointment.  He has been in contact with Dr. Michail Sermon regarding the need for endoscopy and colonoscopy but anticoagulation needs to be stopped prior to procedure.  His cardiologist is Dr. Nolon Lennert.  He has a history of coronary artery disease and was admitted to the hospital June 24, 2020 with chest pain and had a left heart cath by Dr. Claiborne Billings.  MI was ruled out.  He has an appointment in November to see endocrinology regarding serum calcium elevation.  Parathyroid hormone level was 55 in February 2023.  Calcium was 10.8 at the time.  A parathyroid hormone assay was ordered in February 2023 but has not been done.  His calcium was normal September 06, 2021 when he was in the hospital.  Previously had been 10.8, 10.4 and 10.4 between February and March of this year.  Denies chest pain or shortness of breath.  Having muscle cramps at night.  This is not frequent.  Suggested turmeric.  Has appt with Cardiology soon as well. Is on Plavix.  Has appt with Endocrinology in November about   Review of Systems Has been taking Mylanta  for GERD. Has Rx for generic Protonix  but has yet  to start it. Had bout of GERD last evening. Needs to get Rx filled for PPI.     Objective:   Physical Exam Seen today in no acute distress.  He is a little anxious.  Blood pressure 126/80.  Pulse 57 regular temperature 98.1 degrees pulse oximetry 97% weight 192 pounds BMI 26.04  His chest is clear to auscultation.  Cardiac exam regular rate and rhythm without ectopy.       Assessment & Plan:  Mild B12 deficiency that seems to have responded to oral B12 supplement.  B12 level recently was 472  improved from 258 when he was admitted.  He will continue with oral supplementation and follow-up here for B12 level in September.  Complicated migraine-has appointment soon with Dr. Krista Blue.  No further recurrences.  Hypercalcemia-has appointment with Endocrinology in November.  There has been a backlog of appointments with  Endocrine office.  Recent calcium level in the hospital was normal.  Parathyroid hormone assay was ordered in February.  Result was normal at 55.  Therefore nuclear medicine parathyroid scan order was discontinued.  This may not be a serious issue.  History of coronary disease has follow-up soon with Cardiology  GE reflux-needs to take PPI

## 2021-10-19 ENCOUNTER — Ambulatory Visit: Payer: Medicare Other | Admitting: Neurology

## 2021-10-19 ENCOUNTER — Encounter: Payer: Self-pay | Admitting: Neurology

## 2021-10-19 VITALS — BP 124/73 | HR 60 | Ht 72.0 in | Wt 192.5 lb

## 2021-10-19 DIAGNOSIS — F03A4 Unspecified dementia, mild, with anxiety: Secondary | ICD-10-CM | POA: Diagnosis not present

## 2021-10-19 DIAGNOSIS — G43109 Migraine with aura, not intractable, without status migrainosus: Secondary | ICD-10-CM | POA: Diagnosis not present

## 2021-10-19 DIAGNOSIS — M5416 Radiculopathy, lumbar region: Secondary | ICD-10-CM | POA: Insufficient documentation

## 2021-10-19 DIAGNOSIS — I679 Cerebrovascular disease, unspecified: Secondary | ICD-10-CM | POA: Diagnosis not present

## 2021-10-19 DIAGNOSIS — R4189 Other symptoms and signs involving cognitive functions and awareness: Secondary | ICD-10-CM | POA: Insufficient documentation

## 2021-10-19 MED ORDER — BUTALBITAL-APAP-CAFFEINE 50-325-40 MG PO TABS: 1.0000 | ORAL_TABLET | Freq: Four times a day (QID) | ORAL | 5 refills | Status: DC | PRN

## 2021-10-19 MED ORDER — TRAZODONE HCL 50 MG PO TABS: 100.0000 mg | ORAL_TABLET | Freq: Every day | ORAL | 11 refills | Status: DC

## 2021-10-19 NOTE — Progress Notes (Addendum)
Chief Complaint  Patient presents with   Hospitalization Follow-up    Rm 15. Accompanied by wife, Malachy Mood. Saw Dr. Erlinda Hong and needs a f/u w/ Dr. Krista Blue for complicated migraine.      ASSESSMENT AND PLAN  Johnny [name] is a 71 y.o. male   Worsening confusion, word finding difficulties on September 06, 2021, with headaches,  Most likely he suffered a migraine headaches, his worsening confusion and word finding difficulties happened in the background of at least moderate dementia, MoCA examination 16/30 today, extreme stress, poor sleep quality, chronic insomnia, anxiety  Add on trazodone 50 mg may titrating to 100 mg for difficulty sleeping  Fioricet as needed for migraine abortive treatment,  Return to clinic in 6 months with nurse practitioner  DIAGNOSTIC DATA (LABS, IMAGING, TESTING) - I reviewed patient records, labs, notes, testing and imaging myself where available.  In 2023: B12 472, normal BMP, TSH, RPR, HIV, LDL 97.  MEDICAL HISTORY:  DERMOT GREMILLION 71 year old male,, seen in request by vascular neurologist Dr. Erlinda Hong, Cornelius Moras, for complicated migraine headaches, his primary care physician is Dr. Tedra Senegal   I reviewed and summarized the referring note. PMHx. HLD HTN CAD, on plavix Chronic low back pain,  B12 deficiency.  I saw him in 2016 for migraine with aura changes, he presented with right visual field change, that is accompanied by mild lateralized headache  MRI of the brain at that time showed mild small vessel disease,  He does report long history of chronic migraine headaches, often preceded by right visual field change, but only happens rarely  On September 06, 2021, shortly after he woke up while talking his wife, he was found to be confused, difficulty getting his words out, also has mild headaches lasting for about 2 hours, but this time he did not have his usual visual changes that overall accompanied his migraine headache  He is a retired Airline pilot, reported  excessive stress at home, wife suffered MS, and there are 7 other family members living with them over the past few months, which has caused a lot of anxiety on him, he often feels overwhelmed  His mother does suffer dementia, he was noted to have slow worsening memory loss over the past few years, word finding difficulties, which has made worse by his current stress, he has difficulty sleeping, today's MoCA examination is only 16/30  He has history of chronic artery disease, taking Plavix 75 mg, will be on hold for his epidural injection for low back pain, and colonoscopy   EEG on September 06, 2021 was normal  I personally reviewed MRI of the brain September 06, 2021, no acute abnormality, generalized atrophy, mild small vessel disease, progress since 2016  Laboratory evaluations showed normal or negative B12, troponin, BMP, TSH, RPR, B1, HIV, CBC, lipid panel LDL 97   PHYSICAL EXAM:   Vitals:   10/19/21 1103  BP: 124/73  Pulse: 60  Weight: 192 lb 8 oz (87.3 kg)  Height: 6' (1.829 m)     Body mass index is 26.11 kg/m.  PHYSICAL EXAMNIATION:  Gen: NAD, conversant, well nourised, well groomed                     Cardiovascular: Regular rate rhythm, no peripheral edema, warm, nontender. Eyes: Conjunctivae clear without exudates or hemorrhage Neck: Supple, no carotid bruits. Pulmonary: Clear to auscultation bilaterally   NEUROLOGICAL EXAM:  MENTAL STATUS: Speech/cognition: Awake, alert, no aphasia, no dysarthria, slow word finding, and  reaction time     10/19/2021   11:00 AM  Montreal Cognitive Assessment   Visuospatial/ Executive (0/5) 1  Naming (0/3) 3  Attention: Read list of digits (0/2) 2  Attention: Read list of letters (0/1) 0  Attention: Serial 7 subtraction starting at 100 (0/3) 1  Language: Repeat phrase (0/2) 1  Language : Fluency (0/1) 1  Abstraction (0/2) 1  Delayed Recall (0/5) 0  Orientation (0/6) 6  Total 16    CRANIAL NERVES: CN II: Visual fields are  full to confrontation. Pupils are round equal and briskly reactive to light. CN III, IV, VI: extraocular movement are normal. No ptosis. CN V: Facial sensation is intact to light touch CN VII: Face is symmetric with normal eye closure  CN VIII: Hearing is normal to causal conversation. CN IX, X: Phonation is normal. CN XI: Head turning and shoulder shrug are intact  MOTOR: There is no pronator drift of out-stretched arms. Muscle bulk and tone are normal. Muscle strength is normal.  REFLEXES: Reflexes are 2+ and symmetric at the biceps, triceps, knees, and ankles. Plantar responses are flexor.  SENSORY: Intact to light touch, pinprick and vibratory sensation are intact in fingers and toes.  COORDINATION: There is no trunk or limb dysmetria noted.  GAIT/STANCE: Posture is normal. Gait is steady with normal steps, base, arm swing, and turning. Heel and toe walking are normal. Tandem gait is normal.  Romberg is absent.  REVIEW OF SYSTEMS:  Full 14 system review of systems performed and notable only for as above All other review of systems were negative.   ALLERGIES: Allergies  Allergen Reactions   Lipitor [Atorvastatin] Other (See Comments)    Myalgia   Praluent [Alirocumab] Other (See Comments)    Muscle pain and flu like symptoms   Crestor [Rosuvastatin] Other (See Comments)    Myalgia   Imdur [Isosorbide Nitrate] Other (See Comments)    Severe Headaches   Hydrocodone Nausea Only    Can take with food   Levaquin [Levofloxacin] Nausea And Vomiting   Metadate Cd [Methylphenidate Hcl] Other (See Comments)    Burning, GI upset, bloating   Morphine And Related Nausea And Vomiting   Pravastatin Other (See Comments)    myalgia    HOME MEDICATIONS: Current Outpatient Medications  Medication Sig Dispense Refill   clopidogrel (PLAVIX) 75 MG tablet Take 1 tablet (75 mg total) by mouth daily. 90 tablet 0   nebivolol (BYSTOLIC) 2.5 MG tablet TAKE 1 TABLET BY MOUTH EVERY DAY  (Patient taking differently: Take 2.5 mg by mouth daily.) 90 tablet 3   nitroGLYCERIN (NITROSTAT) 0.4 MG SL tablet Place 1 tablet (0.4 mg total) under the tongue every 5 (five) minutes as needed for chest pain. 30 tablet 0   oxyCODONE-acetaminophen (PERCOCET) 10-325 MG tablet Take 1 tablet by mouth every 6 (six) hours as needed for pain.      pantoprazole (PROTONIX) 40 MG tablet Take 1 tablet (40 mg total) by mouth daily. 30 tablet 0   ranolazine (RANEXA) 500 MG 12 hr tablet TAKE 1 TABLET BY MOUTH TWICE A DAY (Patient taking differently: Take 500 mg by mouth 2 (two) times daily.) 180 tablet 3   rosuvastatin (CRESTOR) 5 MG tablet Take 1 tablet (5 mg total) by mouth daily. 90 tablet 3   vitamin B-12 (CYANOCOBALAMIN) 500 MCG tablet Take 1 tablet (500 mcg total) by mouth daily. 30 tablet 3   No current facility-administered medications for this visit.    PAST MEDICAL HISTORY:  Past Medical History:  Diagnosis Date   Adenomatous polyps    Bronchitis    CAD in native artery 2017   Chronic back pain    GERD (gastroesophageal reflux disease)    Hyperlipidemia    Hypertension    Internal hemorrhoids    Low testosterone    Myocardial infarction (HCC)    Osteopenia    Shortness of breath dyspnea    Spondylosis    TIA (transient ischemic attack) 2008   Ureterolithiasis    Ventral hernia    Vitamin D deficiency     PAST SURGICAL HISTORY: Past Surgical History:  Procedure Laterality Date   BACK SURGERY     2 surgeries   CARDIAC CATHETERIZATION N/A 09/12/2015   Procedure: Left Heart Cath and Coronary Angiography;  Surgeon: Lorretta Harp, MD;  Location: [facility];  Service: Cardiovascular;  Laterality: N/A;   CORONARY STENT INTERVENTION N/A 06/23/2019   Procedure: CORONARY STENT INTERVENTION;  Surgeon: Sherren Mocha, MD;  Location: [facility];  Service: Cardiovascular;  Laterality: N/A;   LEFT HEART CATH AND CORONARY ANGIOGRAPHY N/A 01/27/2018   Procedure: LEFT HEART CATH  AND CORONARY ANGIOGRAPHY;  Surgeon: Jettie Booze, MD;  Location: [facility];  Service: Cardiovascular;  Laterality: N/A;   LEFT HEART CATH AND CORONARY ANGIOGRAPHY N/A 06/23/2019   Procedure: LEFT HEART CATH AND CORONARY ANGIOGRAPHY;  Surgeon: Sherren Mocha, MD;  Location: [facility];  Service: Cardiovascular;  Laterality: N/A;   LEFT HEART CATH AND CORONARY ANGIOGRAPHY N/A 06/24/2020   Procedure: LEFT HEART CATH AND CORONARY ANGIOGRAPHY;  Surgeon: Troy Sine, MD;  Location: [facility];  Service: Cardiovascular;  Laterality: N/A;    FAMILY HISTORY: Family History  Problem Relation Age of Onset   Heart disease Mother    Stroke Mother    Heart attack Mother    Emphysema Father     SOCIAL HISTORY: Social History   Socioeconomic History   Marital status: Married    Spouse name: Not on file   Number of children: 4   Years of education: 14   Highest education level: Not on file  Occupational History   Occupation: Geophysical data processor  Tobacco Use   Smoking status: Former    Years: 10.00    Types: Cigarettes    Quit date: 01/04/1978    Years since quitting: 43.8   Smokeless tobacco: Never  Vaping Use   Vaping Use: Never used  Substance and Sexual Activity   Alcohol use: No    Alcohol/week: 0.0 standard drinks    Comment: rarely   Drug use: No   Sexual activity: Not on file  Other Topics Concern   Not on file  Social History Narrative   Lives at home with his wife.   Right-handed.   2 cups caffeine per day.      Social history: He is married.  He had a son involved in a serious motor vehicle accident which took over a year to recover.  His wife has multiple sclerosis and is disabled.  He is a retired Agricultural consultant.  He does not smoke.  Very occasional alcohol consumption.  2 other adult children.       Social Determinants of Health   Financial Resource Strain: Not on file  Food Insecurity: Not on file  Transportation Needs: Not on file  Physical  Activity: Not on file  Stress: Not on file  Social Connections: Not on file  Intimate Partner  Violence: Not on file      Marcial Pacas, M.D. Ph.D.  [facility] [address], [place], [place] [address] Ph: [phone] Fax: [phone]  CC:  Rosalin Hawking, MD [place],  [place] [id number]  [name], MD

## 2021-10-26 ENCOUNTER — Emergency Department (HOSPITAL_COMMUNITY): Payer: Medicare Other

## 2021-10-26 ENCOUNTER — Emergency Department (HOSPITAL_COMMUNITY)
Admission: EM | Admit: 2021-10-26 | Discharge: 2021-10-26 | Disposition: A | Discharge status: Home / Self Care | Payer: Medicare Other | Attending: Emergency Medicine | Admitting: Emergency Medicine

## 2021-10-26 ENCOUNTER — Encounter (HOSPITAL_COMMUNITY): Payer: Self-pay | Admitting: Emergency Medicine

## 2021-10-26 ENCOUNTER — Other Ambulatory Visit: Payer: Self-pay

## 2021-10-26 ENCOUNTER — Emergency Department (HOSPITAL_BASED_OUTPATIENT_CLINIC_OR_DEPARTMENT_OTHER): Payer: Medicare Other

## 2021-10-26 DIAGNOSIS — E785 Hyperlipidemia, unspecified: Secondary | ICD-10-CM

## 2021-10-26 DIAGNOSIS — R001 Bradycardia, unspecified: Secondary | ICD-10-CM | POA: Insufficient documentation

## 2021-10-26 DIAGNOSIS — R079 Chest pain, unspecified: Secondary | ICD-10-CM | POA: Diagnosis not present

## 2021-10-26 DIAGNOSIS — I25118 Atherosclerotic heart disease of native coronary artery with other forms of angina pectoris: Secondary | ICD-10-CM | POA: Diagnosis not present

## 2021-10-26 DIAGNOSIS — I2510 Atherosclerotic heart disease of native coronary artery without angina pectoris: Secondary | ICD-10-CM | POA: Diagnosis not present

## 2021-10-26 DIAGNOSIS — Z7902 Long term (current) use of antithrombotics/antiplatelets: Secondary | ICD-10-CM | POA: Diagnosis not present

## 2021-10-26 DIAGNOSIS — I10 Essential (primary) hypertension: Secondary | ICD-10-CM

## 2021-10-26 DIAGNOSIS — Z7982 Long term (current) use of aspirin: Secondary | ICD-10-CM | POA: Diagnosis not present

## 2021-10-26 DIAGNOSIS — R0602 Shortness of breath: Secondary | ICD-10-CM | POA: Diagnosis not present

## 2021-10-26 LAB — NM MYOCAR MULTI W/SPECT W/WALL MOTION / EF
Base ST Depression (mm): 0 mm
Estimated workload: 1
Exercise duration (min): 0 min
Exercise duration (sec): 0 s
MPHR: 152 {beats}/min
Peak HR: 99 {beats}/min
Percent HR: 65 %
Rest HR: 68 {beats}/min

## 2021-10-26 LAB — CBC WITH DIFFERENTIAL/PLATELET
Abs Immature Granulocytes: 0.01 10*3/uL (ref 0.00–0.07)
Basophils Absolute: 0 10*3/uL (ref 0.0–0.1)
Basophils Relative: 1 %
Eosinophils Absolute: 0.1 10*3/uL (ref 0.0–0.5)
Eosinophils Relative: 2 %
HCT: 37.7 % — ABNORMAL LOW (ref 39.0–52.0)
Hemoglobin: 12.8 g/dL — ABNORMAL LOW (ref 13.0–17.0)
Immature Granulocytes: 0 %
Lymphocytes Relative: 39 %
Lymphs Abs: 2.4 10*3/uL (ref 0.7–4.0)
MCH: 33.9 pg (ref 26.0–34.0)
MCHC: 34 g/dL (ref 30.0–36.0)
MCV: 99.7 fL (ref 80.0–100.0)
Monocytes Absolute: 0.8 10*3/uL (ref 0.1–1.0)
Monocytes Relative: 12 %
Neutro Abs: 2.9 10*3/uL (ref 1.7–7.7)
Neutrophils Relative %: 46 %
Platelets: 180 10*3/uL (ref 150–400)
RBC: 3.78 MIL/uL — ABNORMAL LOW (ref 4.22–5.81)
RDW: 11.8 % (ref 11.5–15.5)
WBC: 6.2 10*3/uL (ref 4.0–10.5)
nRBC: 0 % (ref 0.0–0.2)

## 2021-10-26 LAB — COMPREHENSIVE METABOLIC PANEL
ALT: 15 U/L (ref 0–44)
AST: 16 U/L (ref 15–41)
Albumin: 3.6 g/dL (ref 3.5–5.0)
Alkaline Phosphatase: 31 U/L — ABNORMAL LOW (ref 38–126)
Anion gap: 6 (ref 5–15)
BUN: 13 mg/dL (ref 8–23)
CO2: 26 mmol/L (ref 22–32)
Calcium: 9.2 mg/dL (ref 8.9–10.3)
Chloride: 105 mmol/L (ref 98–111)
Creatinine, Ser: 1.12 mg/dL (ref 0.61–1.24)
GFR, Estimated: >60 mL/min (ref >60)
Glucose, Bld: 89 mg/dL (ref 70–99)
Potassium: 3.7 mmol/L (ref 3.5–5.1)
Sodium: 137 mmol/L (ref 135–145)
Total Bilirubin: 1.4 mg/dL — ABNORMAL HIGH (ref 0.3–1.2)
Total Protein: 6.1 g/dL — ABNORMAL LOW (ref 6.5–8.1)

## 2021-10-26 LAB — TROPONIN I (HIGH SENSITIVITY)
Troponin I (High Sensitivity): 3 ng/L (ref <18)
Troponin I (High Sensitivity): 4 ng/L (ref <18)

## 2021-10-26 MED ORDER — MORPHINE SULFATE (PF) 2 MG/ML IV SOLN: 2.0000 mg | Freq: Once | INTRAVENOUS | Status: DC

## 2021-10-26 MED ORDER — NITROGLYCERIN 0.4 MG SL SUBL
0.4000 mg | SUBLINGUAL_TABLET | Freq: Once | SUBLINGUAL | Status: AC
  Administered 2021-10-26: 0.4 mg via SUBLINGUAL

## 2021-10-26 MED ORDER — ALUM & MAG HYDROXIDE-SIMETH 200-200-20 MG/5ML PO SUSP
30.0000 mL | Freq: Once | ORAL | Status: AC
Start: 2021-10-26 — End: 2021-10-26
  Administered 2021-10-26: 30 mL via ORAL
  Filled 2021-10-26: qty 30

## 2021-10-26 MED ORDER — KETOROLAC TROMETHAMINE 15 MG/ML IJ SOLN
15.0000 mg | Freq: Once | INTRAMUSCULAR | Status: AC
  Administered 2021-10-26: 15 mg via INTRAVENOUS
  Filled 2021-10-26: qty 1

## 2021-10-26 MED ORDER — TECHNETIUM TC 99M TETROFOSMIN IV KIT
10.2000 | PACK | Freq: Once | INTRAVENOUS | Status: AC | PRN
  Administered 2021-10-26: 10.2 via INTRAVENOUS

## 2021-10-26 MED ORDER — REGADENOSON 0.4 MG/5ML IV SOLN
0.4000 mg | Freq: Once | INTRAVENOUS | Status: AC
  Filled 2021-10-26: qty 5

## 2021-10-26 MED ORDER — NITROGLYCERIN 0.4 MG SL SUBL
SUBLINGUAL_TABLET | SUBLINGUAL | Status: AC
  Filled 2021-10-26: qty 1

## 2021-10-26 MED ORDER — LIDOCAINE VISCOUS HCL 2 % MT SOLN
15.0000 mL | Freq: Once | OROMUCOSAL | Status: AC
  Administered 2021-10-26: 15 mL via ORAL
  Filled 2021-10-26: qty 15

## 2021-10-26 MED ORDER — REGADENOSON 0.4 MG/5ML IV SOLN
INTRAVENOUS | Status: AC
  Administered 2021-10-26: 0.4 mg via INTRAVENOUS
  Filled 2021-10-26: qty 5

## 2021-10-26 MED ORDER — TECHNETIUM TC 99M TETROFOSMIN IV KIT
30.3000 | PACK | Freq: Once | INTRAVENOUS | Status: AC | PRN
  Administered 2021-10-26: 30.3 via INTRAVENOUS

## 2021-10-26 NOTE — Consult Note (Addendum)
Cardiology Admission History and Physical:   Patient ID: Johnny [name] MRN: [id number]; DOB: 1951-04-26   Admission date: 10/26/2021  PCP:  [name], MD   [facility] Providers Cardiologist:  [name], MD   Chief Complaint:  Chest Pain  Patient Profile:   Johnny [name] is a 71 y.o. 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  History of Present Illness:   Johnny [name] is a 71 year old male with the above history who is followed by Dr. [name]. Patient has a history of CAD with remote MI. He was admitted in 06/2019 with chest pain and was found to have severe mid to distal LCX disease which was treated with overlapping DES x2. He was also noted to have residual non-obstructive disease of the LAD and RCA/PDA. Last cath in 06/2020 showed  patent stent to th LCX with severe diffuse disease in small caliber distal vessels including the distal LAD, 1st Diag, OM1, and lateral branch of OM2. Continued medical therapy was recommended at that time. Antianginals have been limited due to migraines with long acting nitrates, fatigue with Amlodipine, and inability to tolerate higher doses of Ranexa. Patient was last seen by Ermalinda Barrios, PA-C in 06/2021 at which time he was doing well from a cardiac standpoint with no chest pain or dyspnea.    Patient was recently admitted in 10/5679 with complicated migraine vs TIA after presenting with aphasia. Brain MRI showed no acute infarct. Head/neck CTA showed moderate stenosis of the right PCA in the P2 segment but otherwise was unremarkabe (no emergent large vessel occlusion). Echo showed LVEF of 55-60% with normal wall motion and mild LVH. Patient was seen by Neurology who felt aphasia was more likely due to complicated migraine but TIA could not be ruled out. Patient was  restarted on DAPT with Aspirin and Plavix for 3 weeks with then plans to continue Plavix alone.   Patient was seen in the ED on 10/02/2021 for recurrent chest pain. He reported central/left sided chest pain that radiated straight to his back as well as to his left shoulder an arm. This occurred after eating salsa late at night but reported that pain felt like prior angina. EKG showed no acute ischemic changes and high-sensitivity troponin. Symptoms improved with GI cocktail. He was started on Pepcid but then was felt to be stable for discharge with plans for possible PET stress test if symptoms persisted.  Patient presents back to the ED today for recurrent chest pain.  Patient is a difficult historian and has trouble remembering the timeline of things. He has some mild dementia and follows with Neurology. No family is currently at bedside to help with history.  He states that he thinks he initially felt better after starting the Pepcid but then started having intermittent chest pain over the last couple weeks.  He had an episode of chest pain last night after eating a Hawaiian chicken sandwich.  He describes the pain as a burning sensation in the center of his chest and then like someone was pushing a sharp object through his back.  He ranks the pain as a 9/10 at its worst. He took a dose of sublingual Nitro and states the pain improved after about 15 to 20 minutes. However, he had recurrent chest pain at 4 AM this  morning that woke him up from sleep. This is when he called 911. He is unable to tell me whether episodes always occur after meals but he does not think so.  He states the pain is sometimes worse with activity.  He reports some mild associated shortness of breath with this but no nausea, vomiting, or diaphoresis. Outside of these episodes of chest pain, he does describe some shortness of breath with activities such as walking up the steps but does not sound like he has had any orthopnea or PND.  No  recent lower extremity swelling.  No palpitations, lightheadedness, dizziness, syncope.  He denies any recent fevers or illnesses.  He reports he has seen blood in his stools in the past when he is what caused CKD plated but no recent abnormal bleeding noted in urine or stools  Upon arrival to the ED, patient mildly hypertensive at times but vitals stable. EKG showed mild sinus bradycardia with non-specific T wave changes in lead III and V3. Initial high-sensitivity troponin negative. Repeat pending. WBC 6.2, Hgb 12.8, Plts 180. Na 137, K 3.7, Glucose 89, BUN 13, Cr 1.12. Total Protein 6.1, Albumin 3.6, AST 16, ALT 15, Alk Phos 31, Total Bili 1.4. He was given a GI cocktail and Cardiology was consulted for further evaluation.  At the time of this evaluation, patient reports ongoing 5-6/10 chest pain. He does not feel like the GI cocktail helped much. He is also complaining of some back pain which is a chronic issues for him. He follows in the pain clinic.  Past Medical History:  Diagnosis Date   Adenomatous polyps    Bronchitis    CAD in native artery 2017   Chronic back pain    GERD (gastroesophageal reflux disease)    Hyperlipidemia    Hypertension    Internal hemorrhoids    Low testosterone    Myocardial infarction ([place])    Osteopenia    Shortness of breath dyspnea    Spondylosis    TIA (transient ischemic attack) 2008   Ureterolithiasis    Ventral hernia    Vitamin D deficiency     Past Surgical History:  Procedure Laterality Date   BACK SURGERY     2 surgeries   CARDIAC CATHETERIZATION N/A 09/12/2015   Procedure: Left Heart Cath and Coronary Angiography;  Surgeon: Lorretta Harp, MD;  Location: [place] [facility];  Service: Cardiovascular;  Laterality: N/A;   CORONARY STENT INTERVENTION N/A 06/23/2019   Procedure: CORONARY STENT INTERVENTION;  Surgeon: Sherren Mocha, MD;  Location: [place] [facility];  Service: Cardiovascular;  Laterality: N/A;   LEFT HEART CATH AND CORONARY  ANGIOGRAPHY N/A 01/27/2018   Procedure: LEFT HEART CATH AND CORONARY ANGIOGRAPHY;  Surgeon: Jettie Booze, MD;  Location: [place] [facility];  Service: Cardiovascular;  Laterality: N/A;   LEFT HEART CATH AND CORONARY ANGIOGRAPHY N/A 06/23/2019   Procedure: LEFT HEART CATH AND CORONARY ANGIOGRAPHY;  Surgeon: Sherren Mocha, MD;  Location: [place] [facility];  Service: Cardiovascular;  Laterality: N/A;   LEFT HEART CATH AND CORONARY ANGIOGRAPHY N/A 06/24/2020   Procedure: LEFT HEART CATH AND CORONARY ANGIOGRAPHY;  Surgeon: Troy Sine, MD;  Location: [place] [facility];  Service: Cardiovascular;  Laterality: N/A;     Medications Prior to Admission: Prior to Admission medications   Medication Sig Start Date End Date Taking? Authorizing Provider  butalbital-acetaminophen-caffeine (FIORICET) 50-325-40 MG tablet Take 1 tablet by mouth every 6 (six) hours as needed for headache. 10/19/21  Yes Marcial Pacas, MD  clopidogrel (PLAVIX) 75 MG tablet Take 1 tablet (75 mg total) by mouth daily. 09/08/21  Yes Elgergawy, Silver Huguenin, MD  nebivolol (BYSTOLIC) 2.5 MG tablet TAKE 1 TABLET BY MOUTH EVERY DAY Patient taking differently: Take 2.5 mg by mouth daily. 08/08/21  Yes [name], MD  nitroGLYCERIN (NITROSTAT) 0.4 MG SL tablet Place 1 tablet (0.4 mg total) under the tongue every 5 (five) minutes as needed for chest pain. 10/02/21  Yes Deatra Canter, Amjad, PA-C  oxyCODONE-acetaminophen (PERCOCET) 10-325 MG tablet Take 1 tablet by mouth every 6 (six) hours as needed for pain.  05/25/19  Yes [provider]  pantoprazole (PROTONIX) 40 MG tablet Take 1 tablet (40 mg total) by mouth daily. 10/02/21  Yes Ali, Amjad, PA-C  ranolazine (RANEXA) 500 MG 12 hr tablet TAKE 1 TABLET BY MOUTH TWICE A DAY Patient taking differently: Take 500 mg by mouth 2 (two) times daily. 08/08/21  Yes [name], MD  rosuvastatin (CRESTOR) 5 MG tablet Take 1 tablet (5 mg total) by mouth daily. 08/29/21 11/27/21 Yes [name], MD   traZODone (DESYREL) 50 MG tablet Take 2 tablets (100 mg total) by mouth at bedtime. Patient taking differently: Take 50 mg by mouth at bedtime. 10/19/21  Yes Marcial Pacas, MD  vitamin B-12 (CYANOCOBALAMIN) 500 MCG tablet Take 1 tablet (500 mcg total) by mouth daily. 09/07/21  Yes Elgergawy, Silver Huguenin, MD     Allergies:    Allergies  Allergen Reactions   Lipitor [Atorvastatin] Other (See Comments)    Myalgia   Praluent [Alirocumab] Other (See Comments)    Muscle pain and flu like symptoms   Crestor [Rosuvastatin] Other (See Comments)    Myalgia   Imdur [Isosorbide Nitrate] Other (See Comments)    Severe Headaches   Hydrocodone Nausea Only    Can take with food   Levaquin [Levofloxacin] Nausea And Vomiting   Metadate Cd [Methylphenidate Hcl] Other (See Comments)    Burning, GI upset, bloating   Morphine And Related Nausea And Vomiting   Pravachol [Pravastatin] Other (See Comments)    myalgia    Social History:   Social History   Socioeconomic History   Marital status: Married    Spouse name: Not on file   Number of children: 4   Years of education: 14   Highest education level: Not on file  Occupational History   Occupation: Geophysical data processor  Tobacco Use   Smoking status: Former    Years: 10.00    Types: Cigarettes    Quit date: 01/04/1978    Years since quitting: 43.8   Smokeless tobacco: Never  Vaping Use   Vaping Use: Never used  Substance and Sexual Activity   Alcohol use: No    Alcohol/week: 0.0 standard drinks of alcohol    Comment: rarely   Drug use: No   Sexual activity: Not on file  Other Topics Concern   Not on file  Social History Narrative   Lives at home with his wife.   Right-handed.   2 cups caffeine per day.      Social history: He is married.  He had a son involved in a serious motor vehicle accident which took over a year to recover.  His wife has multiple sclerosis and is disabled.  He is a retired Agricultural consultant.  He does not smoke.  Very occasional  alcohol consumption.  2 other adult children.       Social Determinants of Health  Financial Resource Strain: Not on file  Food Insecurity: Not on file  Transportation Needs: Not on file  Physical Activity: Not on file  Stress: Not on file  Social Connections: Not on file  Intimate Partner Violence: Not on file    Family History:   The patient's family history includes Emphysema in his father; Heart attack in his mother; Heart disease in his mother; Stroke in his mother.    ROS:  Please see the history of present illness.  Review of Systems  Constitutional:  Negative for chills and fever.  HENT:  Negative for congestion.   Respiratory:  Positive for shortness of breath.   Cardiovascular:  Positive for chest pain. Negative for palpitations, orthopnea, leg swelling and PND.  Gastrointestinal:  Positive for blood in stool (with constipation) and constipation. Negative for melena, nausea and vomiting.  Genitourinary:  Negative for hematuria.  Musculoskeletal:  Negative for myalgias.  Neurological:  Negative for dizziness and loss of consciousness.  Endo/Heme/Allergies:  Does not bruise/bleed easily.  Psychiatric/Behavioral:  Positive for memory loss (memory issues).     Physical Exam/Data:   Vitals:   10/26/21 0905 10/26/21 0915 10/26/21 0930 10/26/21 0945  BP: (!) 142/100 131/86 (!) 151/104 (!) 165/97  Pulse: 64 (!) 57 (!) 59 (!) 157  Resp: 15 15 19 20   Temp:      SpO2: 100% 100% 96% 93%   No intake or output data in the 24 hours ending 10/26/21 1040    10/19/2021   11:03 AM 10/17/2021    9:37 AM 10/02/2021    8:09 AM  Last 3 Weights  Weight (lbs) 192 lb 8 oz 192 lb 191 lb  Weight (kg) 87.317 kg 87.091 kg 86.637 kg     There is no height or weight on file to calculate BMI.  General: 71 y.o. Caucasian male resting comfortably in no acute distress. HEENT: Normocephalic and atraumatic. Sclera clear.  Neck: Supple.  No JVD. Heart: RRR. Distinct S1 and S2. No murmurs,  gallops, or rubs.  Lungs: No increased work of breathing. Clear to ausculation bilaterally. No wheezes, rhonchi, or rales.  Abdomen: Soft, non-distended, and non-tender to palpation.  Extremities: No lower extremity edema.    Skin: Warm and dry. Neuro: Alert and oriented x3. No focal deficits. Psych: Normal affect. Responds appropriately.  EKG:  The ECG that was done was personally reviewed and demonstrates sinus bradycardia, rate 54 bpm, with non-specific T wave changes in leads III and V3. Normal axis. Normal PR and QRS intervals. QTc 413 ms.  Relevant CV Studies:  Left Cardiac Catheterization 06/24/2020: Ost LAD to Prox LAD lesion is 20% stenosed. 1st Mrg lesion is 80% stenosed. 1st Diag lesion is 80% stenosed. Dist LAD-1 lesion is 60% stenosed. Dist LAD-2 lesion is 90% stenosed. Previously placed Prox Cx to Dist Cx stent (unknown type) is widely patent. 2nd Mrg lesion is 85% stenosed. Prox RCA-1 lesion is 30% stenosed. Prox RCA-2 lesion is 20% stenosed. RPDA lesion is 30% stenosed. Mid LAD lesion is 40% stenosed. The left ventricular systolic function is normal. LV end diastolic pressure is normal. Lat 2nd Mrg lesion is 100% stenosed.   Diffuse previously noted multivessel CAD with mild proximal LAD irregularity, diffuse 80% diagonal stenoses with 40% mid LAD stenosis, distal 60% stenosis and very distal 90% stenosis in the very small caliber distal LAD the stent in the proximal to mid circumflex vessel is widely patent.  There is previously noted diffuse disease in a small first marginal branch  and 80% ostial stenosis and a jailed OM 2 vessel with distal occlusion of a distal branch vessel noted previously.  The RCA is a dominant vessel with 30% proximal 20% mid 30% diffuse PDA stenosis.   Normal LV contractility without focal segmental wall motion abnormalities.  EF 55 to 60%.   Recommendation: Continue DAPT.  Consider changing back to ticagrelor due to improved potency compared  to clopidogrel.  With diffuse disease and small caliber distal vessels, recommend increase medical therapy trial with titration of ranolazine to 1000 mg twice daily, initiate low-dose amlodipine (intolerant to nitrates due to migraine headaches), continue beta-blocker therapy, and aggressive lipid lowering treatment.   Diagnostic Dominance: Right  _______________   Echocardiogram 09/06/2021: Impressions:  1. Left ventricular ejection fraction, by estimation, is 55 to 60%. The  left ventricle has normal function. The left ventricle has no regional  wall motion abnormalities. There is mild left ventricular hypertrophy.  Left ventricular diastolic parameters  were normal.   2. Right ventricular systolic function is normal. The right ventricular  size is normal. There is normal pulmonary artery systolic pressure.   3. The mitral valve is abnormal. Trivial mitral valve regurgitation. No  evidence of mitral stenosis.   4. The aortic valve is tricuspid. There is mild calcification of the  aortic valve. Aortic valve regurgitation is trivial. Aortic valve  sclerosis is present, with no evidence of aortic valve stenosis.   5. The inferior vena cava is normal in size with greater than 50%  respiratory variability, suggesting right atrial pressure of 3 mmHg.     Laboratory Data:  High Sensitivity Troponin:   Recent Labs  Lab 10/02/21 0606 10/02/21 0819 10/26/21 0615  TROPONINIHS 4 4 3       Chemistry Recent Labs  Lab 10/26/21 0615  NA 137  K 3.7  CL 105  CO2 26  GLUCOSE 89  BUN 13  CREATININE 1.12  CALCIUM 9.2  GFRNONAA >60  ANIONGAP 6    Recent Labs  Lab 10/26/21 0615  PROT 6.1*  ALBUMIN 3.6  AST 16  ALT 15  ALKPHOS 31*  BILITOT 1.4*   Lipids No results for input(s): "CHOL", "TRIG", "HDL", "LABVLDL", "LDLCALC", "CHOLHDL" in the last 168 hours. Hematology Recent Labs  Lab 10/26/21 0615  WBC 6.2  RBC 3.78*  HGB 12.8*  HCT 37.7*  MCV 99.7  MCH 33.9  MCHC 34.0   RDW 11.8  PLT 180   Thyroid No results for input(s): "TSH", "FREET4" in the last 168 hours. BNPNo results for input(s): "BNP", "PROBNP" in the last 168 hours.  DDimer No results for input(s): "DDIMER" in the last 168 hours.   Radiology/Studies:  No results found.   Assessment and Plan:   Chest Pain CAD History of multivessel CAD s/p overlapping DES x2 to LCX in 06/2019. Last cath in 06/2020 showed patent patent stent to th LCX with severe diffuse disease in small caliber distal vessels including the distal LAD, 1st Diag, OM1, and lateral branch of OM2. Continued medical therapy was recommended at that time. Recently seen in the ED on 10/02/2021 for chest pain. Work-up was unremarkable and this was felt to be GI in nature. Now presents with recurrent chest pain that started after dinner last night. However, also reports intermittent chest pain over the last couple of weeks that may not always be associated with meals. EKG showed no acute ischemic changes. Initial troponin negative. Repeat pending. - Patient reports ongoing 5-6/10 chest pain. No significant improvement with GI  cocktail. - Antianginals have been limited due to migraines with long acting nitrates, fatigue with Amlodipine, and inability to tolerate higher doses of Ranexa. Continue Bystolic 2.5OI daily and Ranexa 537m twice daily. - Continue Plavix monotherapy.  - Continue Crestor 54mdaily. Intolerant to high doses of statin and Praluent - being followed in lipid clinic. - Symptoms still sound atypical. However, given this is his second ED visit within the last month will ask [facility] to admit [facility]   Hypertension BP well controlled. - Continue Bystolic 2.3.7CWaily.   Hyperlipidemia Lipid panel in 08/2021: Total Cholesterol 168, Triglycerides 164, HDL 38, LDL 97. LDL goal <55. - Intolerant to multiple statins and Praluent in the past. Followed by lipid clinic and recently agreed to retry  Crestor 32m67maily.   Risk Assessment/Risk Scores:   TIMI Risk Score for Unstable Angina or Non-ST Elevation MI:   The patient's TIMI risk score is 4, which indicates a 20% risk of all cause mortality, new or recurrent myocardial infarction or need for urgent revascularization in the next 14 days.{  For questions or updates, please contact [facility] consult [email or web address] for contact info under     Signed, CalDarreld McleanA-C  10/26/2021 10:40 AM   Patient seen and examined.  Agree with above documentation.  Mr. Johnny [name] a 70 62ar old male history of CAD status post PCI to LCx in 2021, hypertension, TIA, hyperlipidemia who we are consulted by Dr. RanWyvonnia Duskyr evaluation of chest pain.  He was admitted with chest pain in February 2021, found to have severe mid to distal LCx disease status post DES x2.  Most recent cath was in February 2022, which showed patent LCx stents but severe diffuse disease in small caliber distal vessels (distal LAD, D1, OM1, branch of OM 2).  Medical management was recommended.  He was admitted in April 2023 with TIA versus complicated migraine.  No infarct on brain MRI.  Echo at that time showed EF 55 to 60%.  He was discharged on aspirin plus Plavix x3 weeks then was to continue Plavix monotherapy.  He was seen in the ED 10/02/2021 for chest pain.  Occurred after eating salsa, symptoms improved with GI cocktail.  He was started on Pepcid for suspected GERD.  If symptoms recurred, plan was for stress test.  He presents back to the ED today with chest pain.  Reports that he woke up at 4 AM with sharp pain on right side of chest.  Pain lasted for hours, currently resolved.  Initial vital signs notable for BP 108/79, pulse 63, SPO2 91% on room air.  Labs notable for creatinine 1.1, potassium 3.7, troponin 3, hemoglobin 12.8.  EKG shows sinus bradycardia, rate 54, less than 1 mm ST depressions in leads V3.  On exam, patient is alert and oriented, regular rate and  rhythm, no murmurs, lungs CTAB, no LE edema or JVD.  For his chest pain, description is atypical and given continuous chest pain with initial troponin negative, suspect noncardiac chest pain.  We will follow-up second troponin.  If remains negative, plan for Lexiscan Myoview to rule out significant ischemia.  If troponin positive, will recommend cardiac catheterization.  ChrDonato HeinzD

## 2021-10-26 NOTE — Discharge Instructions (Signed)
There is no evidence of heart attack today.  Your stress test was low risk but nonzero risk.  You should follow-up with your primary doctor for recheck of your stomach issues continue the Protonix.  Avoid alcohol, caffeine, NSAID medications, spicy foods.  You have a cardiology appointment on June 16 which you should keep.  Return to the ED sooner with exertional chest pain, pain associate with shortness of breath, nausea, vomiting, sweating, any other concerns

## 2021-10-26 NOTE — ED Provider Notes (Signed)
[facility] EMERGENCY DEPARTMENT Provider Note   CSN: [id number] Arrival date & time: 10/26/21  0606     History  Chief Complaint  Patient presents with   Chest Pain    Hx. CAD / Stents    RAJVEER HANDLER is a 71 y.o. male.  Patient with history of multivessel CAD that is being medically managed, recurrent chest pain presenting with episodes of central chest pain since last night around 8 PM.  States the pain started in the center of his chest and was quite severe lasting for several hours at a time.  He did take 1 nitroglycerin with some relief and try to go to sleep with the pain did not resolve completely.  Pain recurred around 4 AM he took another nitroglycerin and 4 aspirin.  He went to the fire house and was transported here.  He states he is having 5/10 pain which is similar to his previous angina.  He was seen in the ED in May and was told he could have GERD causing his pain as well.  He denies any shortness of breath, nausea, vomiting, diaphoresis.  He denies any cough or fever.  His pain is started to improve in the center of his chest without radiation.  The history is provided by the patient and the EMS personnel.  Chest Pain Associated symptoms: shortness of breath   Associated symptoms: no abdominal pain, no dizziness, no fever, no headache, no nausea, no vomiting and no weakness        Home Medications Prior to Admission medications   Medication Sig Start Date End Date Taking? Authorizing Provider  butalbital-acetaminophen-caffeine (FIORICET) 50-325-40 MG tablet Take 1 tablet by mouth every 6 (six) hours as needed for headache. 10/19/21   Marcial Pacas, MD  clopidogrel (PLAVIX) 75 MG tablet Take 1 tablet (75 mg total) by mouth daily. 09/08/21   Elgergawy, Silver Huguenin, MD  nebivolol (BYSTOLIC) 2.5 MG tablet TAKE 1 TABLET BY MOUTH EVERY DAY Patient taking differently: Take 2.5 mg by mouth daily. 08/08/21   [name], MD  nitroGLYCERIN (NITROSTAT) 0.4 MG  SL tablet Place 1 tablet (0.4 mg total) under the tongue every 5 (five) minutes as needed for chest pain. 10/02/21   Evlyn Courier, PA-C  oxyCODONE-acetaminophen (PERCOCET) 10-325 MG tablet Take 1 tablet by mouth every 6 (six) hours as needed for pain.  05/25/19   [provider]  pantoprazole (PROTONIX) 40 MG tablet Take 1 tablet (40 mg total) by mouth daily. 10/02/21   Evlyn Courier, PA-C  ranolazine (RANEXA) 500 MG 12 hr tablet TAKE 1 TABLET BY MOUTH TWICE A DAY Patient taking differently: Take 500 mg by mouth 2 (two) times daily. 08/08/21   [name], MD  rosuvastatin (CRESTOR) 5 MG tablet Take 1 tablet (5 mg total) by mouth daily. 08/29/21 11/27/21  [name], MD  traZODone (DESYREL) 50 MG tablet Take 2 tablets (100 mg total) by mouth at bedtime. 10/19/21   Marcial Pacas, MD  vitamin B-12 (CYANOCOBALAMIN) 500 MCG tablet Take 1 tablet (500 mcg total) by mouth daily. 09/07/21   Elgergawy, Silver Huguenin, MD      Allergies    Lipitor [atorvastatin], Praluent [alirocumab], Crestor [rosuvastatin], Imdur [isosorbide nitrate], Hydrocodone, Levaquin [levofloxacin], Metadate cd [methylphenidate hcl], Morphine and related, and Pravastatin    Review of Systems   Review of Systems  Constitutional:  Negative for activity change, appetite change and fever.  HENT:  Negative for congestion and rhinorrhea.  Respiratory:  Positive for chest tightness and shortness of breath.   Cardiovascular:  Positive for chest pain.  Gastrointestinal:  Negative for abdominal pain, nausea and vomiting.  Genitourinary:  Negative for dysuria and hematuria.  Musculoskeletal:  Negative for arthralgias and myalgias.  Skin:  Negative for rash.  Neurological:  Negative for dizziness, weakness and headaches.   all other systems are negative except as noted in the HPI and PMH.    Physical Exam Updated Vital Signs BP (!) 150/91 (BP Location: Right Arm)   Pulse (!) 58   Temp 98.2 F (36.8 C)   Resp 14   SpO2 95%  Physical  Exam Vitals and nursing note reviewed.  Constitutional:      General: He is not in acute distress.    Appearance: He is well-developed.  HENT:     Head: Normocephalic and atraumatic.     Mouth/Throat:     Pharynx: No oropharyngeal exudate.  Eyes:     Conjunctiva/sclera: Conjunctivae normal.     Pupils: Pupils are equal, round, and reactive to light.  Neck:     Comments: No meningismus. Cardiovascular:     Rate and Rhythm: Normal rate and regular rhythm.     Heart sounds: Normal heart sounds. No murmur heard. Pulmonary:     Effort: Pulmonary effort is normal. No respiratory distress.     Breath sounds: Normal breath sounds.  Abdominal:     Palpations: Abdomen is soft.     Tenderness: There is no abdominal tenderness. There is no guarding or rebound.  Musculoskeletal:        General: No tenderness. Normal range of motion.     Cervical back: Normal range of motion and neck supple.  Skin:    General: Skin is warm.  Neurological:     Mental Status: He is alert and oriented to person, place, and time.     Cranial Nerves: No cranial nerve deficit.     Motor: No abnormal muscle tone.     Coordination: Coordination normal.     Comments:  5/5 strength throughout. CN 2-12 intact.Equal grip strength.   Psychiatric:        Behavior: Behavior normal.     ED Results / Procedures / Treatments   Labs (all labs ordered are listed, but only abnormal results are displayed) Labs Reviewed  CBC WITH DIFFERENTIAL/PLATELET - Abnormal; Notable for the following components:      Result Value   RBC 3.78 (*)    Hemoglobin 12.8 (*)    HCT 37.7 (*)    All other components within normal limits  COMPREHENSIVE METABOLIC PANEL - Abnormal; Notable for the following components:   Total Protein 6.1 (*)    Alkaline Phosphatase 31 (*)    Total Bilirubin 1.4 (*)    All other components within normal limits  TROPONIN I (HIGH SENSITIVITY)  TROPONIN I (HIGH SENSITIVITY)    EKG EKG  Interpretation  Date/Time:  Thursday October 26 2021 06:16:40 EDT Ventricular Rate:  54 PR Interval:  174 QRS Duration: 94 QT Interval:  436 QTC Calculation: 413 R Axis:   83 Text Interpretation: Sinus bradycardia Possible Anterior infarct , age undetermined Abnormal ECG When compared with ECG of 02-Oct-2021 07:38, PREVIOUS ECG IS PRESENT T wave inversion v3 Confirmed by Ezequiel Essex [phone]) on 10/26/2021 7:22:44 AM  Radiology DG Chest 2 View  Result Date: 10/26/2021 CLINICAL DATA:  Chest pain EXAM: CHEST - 2 VIEW COMPARISON:  10/02/2021 FINDINGS: Bilateral emphysematous changes. No focal consolidation.  No pleural effusion or pneumothorax. Heart and mediastinal contours are unremarkable. No acute osseous abnormality. IMPRESSION: No active cardiopulmonary disease. Electronically Signed   By: Kathreen Devoid M.D.   On: 10/26/2021 11:11    Procedures Procedures    Medications Ordered in ED Medications - No data to display  ED Course/ Medical Decision Making/ A&P                           Medical Decision Making Amount and/or Complexity of Data Reviewed Independent Historian: EMS Labs: ordered. Decision-making details documented in ED Course. Radiology: ordered and independent interpretation performed. Decision-making details documented in ED Course. ECG/medicine tests: ordered and independent interpretation performed. Decision-making details documented in ED Course.  Risk OTC drugs. Prescription drug management.  Recurrent chest pain since last night approximately 8 PM with known significant CAD.  Improvement with nitroglycerin.  Will attempt GI cocktail.  EKG does show new T wave inversion in V3.  Last LHC: Diffuse previously noted multivessel CAD with mild proximal LAD irregularity, diffuse 80% diagonal stenoses with 40% mid LAD stenosis, distal 60% stenosis and very distal 90% stenosis in the very small caliber distal LAD the stent in the proximal to mid circumflex vessel is widely  patent.  There is previously noted diffuse disease in a small first marginal branch and 80% ostial stenosis and a jailed OM 2 vessel with distal occlusion of a distal branch vessel noted previously.  The RCA is a dominant vessel with 30% proximal 20% mid 30% diffuse PDA stenosis.  Patient given GI cocktail with some improvement.  Troponin is negative.  Discussed with cardiology team who is evaluated at bedside.  They do not favor cardiac cause of his chest pain but given his recurrent visits we will plan admission for stress test.  D/w NP Lissa Merlin of hospitalist service. Patient reports no real improvement of chest pain with NTG or GI cocktail. Troponin negative x2.  Low suspicion for PE or aortic dissection.  Stress test was obtained in the ED and is low risk. Did have some ST depressions during test.   D/w NP Sarajane Jews and Dr. Gardiner Rhyme of cardiology who reviewed images. They are comfortable with discharge from a cardiology standpoint and will followup in office on June 16. Continue GERD treatment with PPI, avoid alcohol, caffeine, NSAIDs, spicy foods.   Patient and family comfortable with discharge. Chest pain has improved. Return to the ED with exertional pain, SOB, diaphoresis, vomiting, or any other concerns.         Final Clinical Impression(s) / ED Diagnoses Final diagnoses:  None    Rx / DC Orders ED Discharge Orders     None         Brighton Delio, Annie Main, MD 10/26/21 1734

## 2021-10-26 NOTE — Progress Notes (Signed)
   Patient presented for a nuclear stress test today. No immediate complications. Stress imaging is pending at this time.  He did have chest pain and dyspnea throughout test. However, he was having chest pain prior to test. He is a difficult  historian and when asked whether chest pain was worse after the [place] he gave me multiple different answers. He did have some ST depressions noted in inferior leads and leads V4-V6. However, he was also moving around during the test quite a bit. I did give one dose of Nitroglycerin following study and when I checked on him before leaving, he said chest pain had improved slightly.   Preliminary EKG findings may be listed in the chart, but the stress test result will not be finalized until perfusion imaging is complete.  Darreld Mclean, PA-C 10/26/2021 2:32 PM

## 2021-10-26 NOTE — ED Triage Notes (Signed)
Patient arrived with EMS reports central chest pain with mild SOB this morning , he received 2 NTG sl and ASA 324 mg po prior to arrival with relief. History of CAD/Stents , his cardiologist is Dr. [name].

## 2021-10-26 NOTE — ED Provider Triage Note (Signed)
Emergency Medicine Provider Triage Evaluation Note  Johnny [name] , a 71 y.o. male  was evaluated in triage.  Pt complains of chest pain.  States started about 3 to 4 days ago initially intermittent in nature however since 8 PM last night pain has been persistent and worse.  Took nitro with some relief.  Recently evaluated in the emergency room for similar complaint.  States he had cardiology follow-up scheduled for next week.  Denies shortness of breath or other complaints.  Review of Systems  Positive: As above Negative: As above  Physical Exam  BP 108/79 (BP Location: Right Arm)   Pulse 63   Temp 98.2 F (36.8 C)   Resp 18   SpO2 91%  Gen:   Awake, no distress   Resp:  Normal effort  MSK:   Moves extremities without difficulty  Other:    Medical Decision Making  Medically screening exam initiated at 6:13 AM.  Appropriate orders placed.  BRYER COZZOLINO was informed that the remainder of the evaluation will be completed by another provider, this initial triage assessment does not replace that evaluation, and the importance of remaining in the ED until their evaluation is complete.    Evlyn Courier, PA-C 10/26/21 [phone]

## 2021-11-02 DIAGNOSIS — I700 Atherosclerosis of aorta: Secondary | ICD-10-CM | POA: Insufficient documentation

## 2021-11-02 NOTE — Progress Notes (Signed)
Cardiology Office Note:    Date:  11/03/2021   ID:  Johnny [name], DOB 1950-12-24, MRN [id number]  PCP:  [name], MD  [facility] Providers Cardiologist:  [name], MD    Referring MD: [name], MD   Chief Complaint:  Hospitalization Follow-up (ED visit with chest pain )    Patient Profile: [name]; med Rx S/p DES x 2 to LCx in 06/2019 Cath 2/22: LCx stent patent; mod to severe distal and small branch vessel dz>>Med Rx Intol of nitrates 2/2 HAs; higher dose Ranolazine (?hematuria); amlodipine (fatigue) Hypertension  Hyperlipidemia  Statin intol; able to tol low dose rosuvastatin  Hesitant to start alt Rx Hx of TIA Admx 08/2021 w aphasia - Neuro felt complex migraine >> TIA (tx w DAPT x 21 days, then Plavix alone) GERD Lung nodules Aortic atherosclerosis   Prior CV Studies: NM Myocar Multi W/Spect W/Wall Motion / EF 10/26/2021 Normal LV perfusion, EF >65; low risk   ECHO COMPLETE WO IMAGING ENHANCING AGENT 09/06/2021 EF 55-60, no RWMA, mild LVH, normal diastolic function, normal RVSF, normal PASP, trivial MR, trivial AI, AV sclerosis without stenosis, estimated RVSP 30.1    Cardiac catheterization 06/24/20 LAD ost 20, mid 40, dist 60/90; D1 80 (small) LCx stent patent; OM1 80 (small); Lat OM2 100 RCA prox 30/20; RPDA 30 EF 55-65      Echocardiogram 06/24/20 EF 60-65, no RWMA, Gr 1 DD, mild dilation at level of SoV (39 mm), ascending aorta (38 mm)   Chest CT 07/08/19 Small bilateral pulmonary nodules, 82m (repeat CT 06/2020) Old granulomatous disease CAD Aortic atherosclerosisi Emphysema   Carotid [place]2/12/16 Bilat ICA 1-39  History of Present Illness:   Johnny LAKINSis a 71y.o. male with the above problem list.  He was last seen in the office for surgical clearance in February 2023.  He was admitted for symptoms of aphasia in April 2023.  He was seen by neurology and it was felt that it was more likely that his  symptoms were due to a complex migraine rather than TIA.  He was seen in the emergency room in May 2023 with chest pain.  His hsTrops were negative and he was seen by cardiology.  Symptoms were felt to be related to acid reflux.   He was seen again in the emergency room for chest pain on October 26, 2021.  He had prolonged chest pain with negative troponins.  Lexiscan Myoview was obtained and demonstrated no ischemia.  He was discharged with plans for outpatient follow-up.    He returns for follow-up.  He is here today with his wife.  He continues to have chest discomfort.  This occurs with exertion and is worse after eating.  He notes symptoms of odynophagia as well as dysphagia.  He also has symptoms with bending over.  He has lost a little over 20 pounds since last year.  He has not had orthopnea, leg edema, syncope.  He was set up with Dr. SMichail Sermonfor GI evaluation prior to his emergency room visit in April.  This was placed on hold afterward.  He currently takes Protonix once a day.  He also uses Mylanta which provides immediate relief.  He has also had relief with nitroglycerin.    Past Medical History:  Diagnosis Date  . Adenomatous polyps   . Bronchitis   . CAD in native artery 2017  . Chronic back pain   . GERD (gastroesophageal  reflux disease)   . Hyperlipidemia   . Hypertension   . Internal hemorrhoids   . Low testosterone   . Myocardial infarction ([place])   . Osteopenia   . Shortness of breath dyspnea   . Spondylosis   . TIA (transient ischemic attack) 2008  . Ureterolithiasis   . Ventral hernia   . Vitamin D deficiency    Current Medications: Current Meds  Medication Sig  . butalbital-acetaminophen-caffeine (FIORICET) 50-325-40 MG tablet Take 1 tablet by mouth every 6 (six) hours as needed for headache.  . clopidogrel (PLAVIX) 75 MG tablet Take 1 tablet (75 mg total) by mouth daily.  Marland Kitchen HYDROcodone-acetaminophen (NORCO) 10-325 MG tablet Take by mouth as needed for pain.  .  nebivolol (BYSTOLIC) 2.5 MG tablet TAKE 1 TABLET BY MOUTH EVERY DAY  . ranolazine (RANEXA) 500 MG 12 hr tablet TAKE 1 TABLET BY MOUTH TWICE A DAY  . rosuvastatin (CRESTOR) 5 MG tablet Take 1 tablet (5 mg total) by mouth daily.  . traZODone (DESYREL) 50 MG tablet Take 2 tablets (100 mg total) by mouth at bedtime. (Patient taking differently: Take 50 mg by mouth as needed for sleep.)  . vitamin B-12 (CYANOCOBALAMIN) 500 MCG tablet Take 1 tablet (500 mcg total) by mouth daily.  . [DISCONTINUED] nitroGLYCERIN (NITROSTAT) 0.4 MG SL tablet Place 1 tablet (0.4 mg total) under the tongue every 5 (five) minutes as needed for chest pain.  . [DISCONTINUED] pantoprazole (PROTONIX) 40 MG tablet Take 1 tablet (40 mg total) by mouth daily.    Allergies:   Lipitor [atorvastatin], Praluent [alirocumab], Crestor [rosuvastatin], Imdur [isosorbide nitrate], Hydrocodone, Levaquin [levofloxacin], Metadate cd [methylphenidate hcl], Morphine and related, and Pravachol [pravastatin]   Social History   Tobacco Use  . Smoking status: Former    Years: 10.00    Types: Cigarettes    Quit date: 01/04/1978    Years since quitting: 43.8  . Smokeless tobacco: Never  Vaping Use  . Vaping Use: Never used  Substance Use Topics  . Alcohol use: No    Alcohol/week: 0.0 standard drinks of alcohol    Comment: rarely  . Drug use: No    Family Hx: The patient's family history includes Emphysema in his father; Heart attack in his mother; Heart disease in his mother; Stroke in his mother.  Review of Systems  Gastrointestinal:  Negative for hematochezia and melena.  Genitourinary:  Negative for hematuria.     EKGs/Labs/Other Test Reviewed:    EKG:  EKG is not ordered today.  The ekg ordered today demonstrates n/a  Recent Labs: 09/07/2021: TSH 0.418 10/26/2021: ALT 15; BUN 13; Creatinine, Ser 1.12; Hemoglobin 12.8; Platelets 180; Potassium 3.7; Sodium 137   Recent Lipid Panel Recent Labs    09/06/21 1230  CHOL 168  TRIG  164*  HDL 38*  VLDL 33  LDLCALC 97     Risk Assessment/Calculations/Metrics:              Physical Exam:    VS:  BP 112/70   Pulse 61   Ht 6' (1.829 m)   Wt 190 Johnny [name] (86.2 kg)   SpO2 98%   BMI 25.77 kg/m     Wt Readings from Last 3 Encounters:  11/03/21 190 Johnny [name] (86.2 kg)  10/19/21 192 Johnny [name] 8 oz (87.3 kg)  10/17/21 192 Johnny [name] (87.1 kg)    Constitutional:      Appearance: Healthy appearance. Not in distress.  Neck:     Vascular: JVD normal.  Pulmonary:  Effort: Pulmonary effort is normal.     Breath sounds: No wheezing. No rales.  Cardiovascular:     Normal rate. Regular rhythm. Normal S1. Normal S2.      Murmurs: There is no murmur.  Edema:    Peripheral edema absent.  Abdominal:     Palpations: Abdomen is soft.     Tenderness: There is abdominal tenderness (epigastric).  Skin:    General: Skin is warm and dry.  Neurological:     General: No focal deficit present.     Mental Status: Alert and oriented to person, place and time.         ASSESSMENT & PLAN:   Coronary artery disease of native artery of native heart with stable angina pectoris ([place]) History of remote myocardial infarction.  He is status post DES to the LCx and 2/21.  Cardiac catheterization in 2022 demonstrated patent stent in the LCx.  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  His recent visit to the emergency room seems to be more consistent with gastrointestinal etiology than cardiac ischemia.  He had several hours of chest pain with negative troponins.  His recent Myoview was also low risk.  I have encouraged him to seek follow-up with gastroenterology for further management of his symptoms.  From a cardiology standpoint, he can hold clopidogrel for his procedure if needed. I would not recommend substituting ASA for clopidogrel given his current GI symptoms.  For now, continue clopidogrel 75 mg daily, nebivolol 2.5 mg daily, ranolazine 500 mg twice daily,  rosuvastatin 5 mg daily.  Follow-up with Dr. [name] in 4 months.  Essential hypertension The patient's blood pressure is controlled on his current regimen.  Continue current therapy.   Hyperlipidemia LDL goal <70 He saw our Pharm.D. lipid clinic a couple of months ago and was placed back on rosuvastatin 5 mg daily.  He has follow-up labs later this month.  Aortic atherosclerosis (HCC) Continue clopidogrel 75 mg daily, rosuvastatin 5 mg daily.  GE reflux As noted, his symptoms seem to be all related to a gastrointestinal etiology.  He has had no objective evidence of ischemia on recent testing.  I have encouraged him to increase his Protonix to 40 mg twice daily.  He can also continue to use Mylanta.  I have encouraged him to contact Dr. Kathline Magic office to see if sucralfate may be beneficial for him until he can get in for evaluation.         Dispo:  Return in about 4 months (around 03/05/2022) for Routine follow up in 4 months with Dr. [name].   Medication Adjustments/Labs and Tests Ordered: Current medicines are reviewed at length with the patient today.  Concerns regarding medicines are outlined above.  Tests Ordered: No orders of the defined types were placed in this encounter.  Medication Changes: Meds ordered this encounter  Medications  . pantoprazole (PROTONIX) 40 MG tablet    Sig: Take 1 tablet (40 mg total) by mouth 2 (two) times daily.    Dispense:  60 tablet    Refill:  0  . nitroGLYCERIN (NITROSTAT) 0.4 MG SL tablet    Sig: Place 1 tablet (0.4 mg total) under the tongue every 5 (five) minutes as needed for chest pain.    Dispense:  25 tablet    Refill:  1   Signed, Richardson Dopp, PA-C  11/03/2021 1:21 PM    [place], [place]  [address] Phone: [phone]; Fax: [phone]

## 2021-11-03 ENCOUNTER — Ambulatory Visit: Payer: Medicare Other | Admitting: Physician Assistant

## 2021-11-03 ENCOUNTER — Other Ambulatory Visit: Payer: Self-pay | Admitting: Cardiovascular Disease

## 2021-11-03 ENCOUNTER — Encounter: Payer: Self-pay | Admitting: Physician Assistant

## 2021-11-03 VITALS — BP 112/70 | HR 61 | Ht 72.0 in | Wt 190.0 lb

## 2021-11-03 DIAGNOSIS — E785 Hyperlipidemia, unspecified: Secondary | ICD-10-CM | POA: Diagnosis not present

## 2021-11-03 DIAGNOSIS — I700 Atherosclerosis of aorta: Secondary | ICD-10-CM | POA: Diagnosis not present

## 2021-11-03 DIAGNOSIS — I25118 Atherosclerotic heart disease of native coronary artery with other forms of angina pectoris: Secondary | ICD-10-CM | POA: Diagnosis not present

## 2021-11-03 DIAGNOSIS — I10 Essential (primary) hypertension: Secondary | ICD-10-CM | POA: Diagnosis not present

## 2021-11-03 DIAGNOSIS — K219 Gastro-esophageal reflux disease without esophagitis: Secondary | ICD-10-CM

## 2021-11-03 MED ORDER — NITROGLYCERIN 0.4 MG SL SUBL: 0.4000 mg | SUBLINGUAL_TABLET | SUBLINGUAL | 1 refills | Status: DC | PRN

## 2021-11-03 MED ORDER — PANTOPRAZOLE SODIUM 40 MG PO TBEC: 40.0000 mg | DELAYED_RELEASE_TABLET | Freq: Two times a day (BID) | ORAL | 0 refills | Status: DC

## 2021-11-03 NOTE — Assessment & Plan Note (Signed)
As noted, his symptoms seem to be all related to a gastrointestinal etiology.  He has had no objective evidence of ischemia on recent testing.  I have encouraged him to increase his Protonix to 40 mg twice daily.  He can also continue to use Mylanta.  I have encouraged him to contact Dr. Kathline Magic office to see if sucralfate may be beneficial for him until he can get in for evaluation.

## 2021-11-03 NOTE — Assessment & Plan Note (Signed)
He saw our Pharm.D. lipid clinic a couple of months ago and was placed back on rosuvastatin 5 mg daily.  He has follow-up labs later this month.

## 2021-11-03 NOTE — Patient Instructions (Signed)
Medication Instructions:   INCREASE Protonix one ( 1) tablet by mouth ( 40 mg) twice daily until you see Dr. Michail Sermon.   Ask Dr. Michail Sermon if they would prescribe sucralfate for your stomach.  *If you need a refill on your cardiac medications before your next appointment, please call your pharmacy*   Lab Work:  None ordered.  If you have labs (blood work) drawn today and your tests are completely normal, you will receive your results only by: [place] (if you have MyChart) OR A paper copy in the mail If you have any lab test that is abnormal or we need to change your treatment, we will call you to review the results.   Testing/Procedures:  None ordered.   Follow-Up: At [facility], you and your health needs are our priority.  As part of our continuing mission to provide you with exceptional heart care, we have created designated Provider Care Teams.  These Care Teams include your primary Cardiologist (physician) and Advanced Practice Providers (APPs -  Physician Assistants and Nurse Practitioners) who all work together to provide you with the care you need, when you need it.  We recommend signing up for the patient portal called "MyChart".  Sign up information is provided on this After Visit Summary.  MyChart is used to connect with patients for Virtual Visits (Telemedicine).  Patients are able to view lab/test results, encounter notes, upcoming appointments, etc.  Non-urgent messages can be sent to your provider as well.   To learn more about what you can do with MyChart, go to [email or web address].    Your next appointment:   4 month(s)  The format for your next appointment:   In Person  Provider:   [name], MD   formation [employer]

## 2021-11-03 NOTE — Assessment & Plan Note (Signed)
The patient's blood pressure is controlled on his current regimen.  Continue current therapy.  

## 2021-11-03 NOTE — Assessment & Plan Note (Signed)
Continue clopidogrel 75 mg daily, rosuvastatin 5 mg daily.

## 2021-11-03 NOTE — Assessment & Plan Note (Addendum)
History of remote myocardial infarction.  He is status post DES to the LCx and 2/21.  Cardiac catheterization in 2022 demonstrated patent stent in the LCx.  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  His recent visit to the emergency room seems to be more consistent with gastrointestinal etiology than cardiac ischemia.  He had several hours of chest pain with negative troponins.  His recent Myoview was also low risk.  I have encouraged him to seek follow-up with gastroenterology for further management of his symptoms.  From a cardiology standpoint, he can hold clopidogrel for his procedure if needed. I would not recommend substituting ASA for clopidogrel given his current GI symptoms.  For now, continue clopidogrel 75 mg daily, nebivolol 2.5 mg daily, ranolazine 500 mg twice daily, rosuvastatin 5 mg daily.  Follow-up with Dr. [name] in 4 months.

## 2021-11-06 ENCOUNTER — Telehealth: Payer: Self-pay | Admitting: *Deleted

## 2021-11-06 NOTE — Telephone Encounter (Signed)
   Pre-operative Risk Assessment    Patient Name: Johnny [name]  DOB: [birth year] MRN: [id number]      Request for Surgical Clearance    Procedure:   COLONOSCOPY/ENDOSCOPY  Date of Surgery:  Clearance 01/01/22                                 Surgeon:  DR. Michail Sermon Surgeon's Group or Practice Name:  [facility] Phone number:  [phone] Fax number:  [phone]   Type of Clearance Requested:   - Medical  - Pharmacy:  Hold Clopidogrel (Plavix)     Type of Anesthesia:   PROPOFOL   Additional requests/questions:    Jiles Prows   11/06/2021, 8:39 AM

## 2021-11-06 NOTE — Telephone Encounter (Signed)
   Primary Cardiologist: [name], MD  Chart reviewed as part of pre-operative protocol coverage. Given past medical history and time since last visit, based on ACC/AHA guidelines, CEJAY CAMBRE would be at acceptable risk for the planned procedure without further cardiovascular testing.   Patient's Plavix is not prescribed by a cardiology provider.  Recommendations for holding Plavix will need to come from prescribing provider.  I will route this recommendation to the requesting party via Epic fax function and remove from pre-op pool.  Please call with questions.  Jossie Ng. Yazmin Locher NP-C    11/06/2021, 10:17 AM [place] [address] Office [phone] Fax [phone]

## 2021-11-10 ENCOUNTER — Other Ambulatory Visit: Payer: Self-pay | Admitting: Neurology

## 2021-11-17 ENCOUNTER — Other Ambulatory Visit: Payer: Medicare Other

## 2021-11-17 DIAGNOSIS — E7800 Pure hypercholesterolemia, unspecified: Secondary | ICD-10-CM

## 2021-11-17 DIAGNOSIS — I214 Non-ST elevation (NSTEMI) myocardial infarction: Secondary | ICD-10-CM

## 2021-11-18 LAB — LIPID PANEL
Chol/HDL Ratio: 4.1 ratio (ref 0.0–5.0)
Cholesterol, Total: 189 mg/dL (ref 100–199)
HDL: 46 mg/dL (ref >39)
LDL Chol Calc (NIH): 120 mg/dL — ABNORMAL HIGH (ref 0–99)
Triglycerides: 130 mg/dL (ref 0–149)
VLDL Cholesterol Cal: 23 mg/dL (ref 5–40)

## 2021-11-18 LAB — APOLIPOPROTEIN B: Apolipoprotein B: 94 mg/dL — ABNORMAL HIGH (ref <90)

## 2021-12-13 ENCOUNTER — Telehealth: Payer: Self-pay | Admitting: Cardiovascular Disease

## 2021-12-13 NOTE — Telephone Encounter (Signed)
Patient had a history of PCI in February 2021, repeat cardiac catheterization in February 2022 revealed patent stent, severe small vessel disease not amenable to PCI, treated medically.  He was on Plavix therapy before.  More recently, patient was seen by Estella Husk PA-C on 07/18/2021 for preoperative clearance prior to upcoming GI procedure.  Patient was cleared with instruction to hold Plavix for 5-7 days.  Unfortunately patient presented to the hospital on 09/06/2021 with aphasia concerning for either migraine versus TIA.  He was seen by stroke team was recommendations were clopidogrel 75 mg daily (on hold for epidural injection for back pain) prior to admission, now on aspirin 81 mg daily and clopidogrel 75 mg daily DAPT for 3 weeks and then Plavix alone.  He has since transition to Plavix monotherapy.  Recent echocardiogram obtained on 09/06/2021 showed normal EF 55 to 60%, trivial MR.  Since released from the hospital, patient underwent nuclear stress test on 10/26/2021 which came back low risk with normal perfusion.  From the cardiac perspective, patient is okay to hold Plavix for 5 to 7 days prior to GI work-up, however given potential recent TIA, this will need to be cleared by neurology service as well.  We will forward to Dr. Krista Blue to review.

## 2021-12-13 NOTE — Telephone Encounter (Signed)
Melissa from [facility] states that she was told from the pt wife, that the pt gets plavix from cardiology. He was prescribed the medication at the hospital in April but they need clearance from [place] for that medication because its a cardiac medication. Please advise.   She ask that you give her a call back to clarify. Best callback [phone] - Lenna Sciara

## 2021-12-13 NOTE — Telephone Encounter (Signed)
I will send this message to the pre op provider to please review. Looks like pt was cleared in 10/2021, see notes from Coletta Memos, [place]

## 2021-12-14 NOTE — Telephone Encounter (Signed)
It is okay to hold Plavix 75 mg 5 to 7 days prior to GI work-up

## 2021-12-15 NOTE — Telephone Encounter (Signed)
   Patient Name: Johnny [name]  DOB: Aug 02, 1950 MRN: [id number]  Primary Cardiologist: [name], MD  Chart reviewed as part of pre-operative protocol coverage.   Per office protocol, and per neurology, patient may hold Plavix for 5 to 7 days prior to procedure.   I will route this recommendation as well as previous clearance notes to the requesting party via [facility] fax function and remove from pre-op pool.  Please call with questions.  Lenna Sciara, NP 12/15/2021, 11:59 AM

## 2022-01-01 ENCOUNTER — Encounter: Payer: Self-pay | Admitting: Internal Medicine

## 2022-01-01 LAB — HM COLONOSCOPY

## 2022-01-19 ENCOUNTER — Telehealth: Payer: Self-pay | Admitting: Cardiovascular Disease

## 2022-01-19 MED ORDER — CLOPIDOGREL BISULFATE 75 MG PO TABS: 75.0000 mg | ORAL_TABLET | Freq: Every day | ORAL | 2 refills | Status: DC

## 2022-01-19 NOTE — Telephone Encounter (Signed)
Pt's medication was sent to pt's pharmacy as requested. Confirmation received.  °

## 2022-01-19 NOTE — Telephone Encounter (Signed)
*  STAT* If patient is at the pharmacy, call can be transferred to refill team.   1. Which medications need to be refilled? (please list name of each medication and dose if known) clopidogrel (PLAVIX) 75 MG tablet  2. Which pharmacy/location (including street and city if local pharmacy) is medication to be sent to? [id number] [place], [place] - [address]  3. Do they need a 30 day or 90 day supply? 90   Patient only have 2 pills left

## 2022-01-23 ENCOUNTER — Encounter: Payer: Self-pay | Admitting: Internal Medicine

## 2022-01-30 ENCOUNTER — Other Ambulatory Visit: Payer: Medicare Other

## 2022-01-30 DIAGNOSIS — E538 Deficiency of other specified B group vitamins: Secondary | ICD-10-CM

## 2022-02-01 ENCOUNTER — Ambulatory Visit: Payer: Medicare Other | Admitting: Internal Medicine

## 2022-02-22 ENCOUNTER — Other Ambulatory Visit (HOSPITAL_COMMUNITY): Payer: Self-pay

## 2022-02-22 MED ORDER — HYDROCODONE-ACETAMINOPHEN 10-325 MG PO TABS
1.0000 | ORAL_TABLET | ORAL | 0 refills | Status: DC | PRN
  Filled 2022-02-22: qty 180, 30d supply, fill #0

## 2022-03-08 NOTE — Progress Notes (Signed)
Cardiology Office Note:    Date:  03/13/2022   ID:  Johnny [name], DOB [birth year], MRN [id number]  PCP:  Johnny Showers, MD  [facility] Providers Cardiologist:  Johnny Rouge, MD    Referring MD: Johnny Showers, MD   Chief Complaint:  No chief complaint on file.    Patient Profile: [name]; med Rx S/p DES x 2 to LCx in 06/2019 Cath 2/22: LCx stent patent; mod to severe distal and small branch vessel dz>>Med Rx Intol of nitrates 2/2 HAs; higher dose Ranolazine (?hematuria); amlodipine (fatigue) Hypertension  Hyperlipidemia  Statin intol; able to tol low dose rosuvastatin  Hesitant to start alt Rx Hx of TIA Admx 08/2021 w aphasia - Neuro felt complex migraine >> TIA (tx w DAPT x 21 days, then Plavix alone) GERD Lung nodules Aortic atherosclerosis   Prior CV Studies: NM Myocar Multi W/Spect W/Wall Motion / EF 10/26/2021 Normal LV perfusion, EF >65; low risk   ECHO COMPLETE WO IMAGING ENHANCING AGENT 09/06/2021 EF 55-60, no RWMA, mild LVH, normal diastolic function, normal RVSF, normal PASP, trivial MR, trivial AI, AV sclerosis without stenosis, estimated RVSP 30.1    Cardiac catheterization 06/24/20 LAD ost 20, mid 40, dist 60/90; D1 80 (small) LCx stent patent; OM1 80 (small); Lat OM2 100 RCA prox 30/20; RPDA 30 EF 55-65      Echocardiogram 06/24/20 EF 60-65, no RWMA, Gr 1 DD, mild dilation at level of SoV (39 mm), ascending aorta (38 mm)   Chest CT 07/08/19 Small bilateral pulmonary nodules, 52m (repeat CT 06/2020) Old granulomatous disease CAD Aortic atherosclerosisi Emphysema   Carotid [place]16 Bilat ICA 1-39  History of Present Illness:   Johnny [name] a 71y.o. [name] with the above problem list.  He was last seen in the office for surgical clearance in February 2023.  He was admitted for symptoms of aphasia in April 2023.  He was seen by neurology and it was felt that it was more likely that his symptoms were due to a complex  migraine rather than TIA.  He was seen in the emergency room in May 2023 with chest pain.  His hsTrops were negative and he was seen by cardiology.  Symptoms were felt to be related to acid reflux.   He was seen again in the emergency room for chest pain on October 26, 2021.  He had prolonged chest pain with negative troponins.  Lexiscan Myoview was obtained and demonstrated no ischemia.  He was discharged with plans for outpatient follow-up.    He returns for follow-up.  Chest discomfort seems GI in nature with some weight loss and odynophagia Had EGD/colon with Dr SDagmar Haitwith just 4 mm rectal polyp removed EGD with some gastritis Rx Protonix  He continues to have issues eating after 8:00 , spicy food, and coffee that aggravates his reflux Also has two sons and 5 grand kids living at his house which is stressful    Past Medical History:  Diagnosis Date   Adenomatous polyps    Bronchitis    CAD in native artery 2017   Chronic back pain    GERD (gastroesophageal reflux disease)    Hyperlipidemia    Hypertension    Internal hemorrhoids    Low testosterone    [place]    Osteopenia    Shortness of breath dyspnea    Spondylosis    TIA (transient ischemic attack) 2008   Ureterolithiasis  Ventral hernia    Vitamin D deficiency    Current Medications: Current Meds  Medication Sig   butalbital-acetaminophen-caffeine (FIORICET) 50-325-40 MG tablet Take 1 tablet by mouth every 6 (six) hours as needed for headache.   clopidogrel (PLAVIX) 75 MG tablet Take 1 tablet (75 mg total) by mouth daily.   HYDROcodone-acetaminophen (NORCO) 10-325 MG tablet Take by mouth as needed for pain.   HYDROcodone-acetaminophen (NORCO) 10-325 MG tablet Take 1 tablet by mouth every 4 (four) hours as needed for pain.   nebivolol (BYSTOLIC) 2.5 MG tablet TAKE 1 TABLET BY MOUTH EVERY DAY   nitroGLYCERIN (NITROSTAT) 0.4 MG SL tablet Place 1 tablet (0.4 mg total) under the tongue every 5 (five)  minutes as needed for chest pain.   ranolazine (RANEXA) 500 MG 12 hr tablet TAKE 1 TABLET BY MOUTH TWICE A DAY   sucralfate (CARAFATE) 1 g tablet Take 1 g by mouth 2 (two) times daily as needed. Stomache   traZODone (DESYREL) 50 MG tablet Take 50 mg by mouth 3 times/day as needed-between meals & bedtime for sleep.   vitamin B-12 (CYANOCOBALAMIN) 500 MCG tablet Take 1 tablet (500 mcg total) by mouth daily.    Allergies:   Lipitor [atorvastatin], Praluent [alirocumab], Crestor [rosuvastatin], Imdur [isosorbide nitrate], Hydrocodone, Levaquin [levofloxacin], Metadate cd [methylphenidate hcl], Morphine and related, and Pravachol [pravastatin]   Social History   Tobacco Use   Smoking status: Former    Years: 10.00    Types: Cigarettes    Quit date: 01/04/1978    Years since quitting: 44.2   Smokeless tobacco: Never  Vaping Use   Vaping Use: Never used  Substance Use Topics   Alcohol use: No    Alcohol/week: 0.0 standard drinks of alcohol    Comment: rarely   Drug use: No    Family Hx: The patient's family history includes Emphysema in his father; Heart attack in his mother; Heart disease in his mother; Stroke in his mother.  Review of Systems  Gastrointestinal:  Negative for hematochezia and melena.  Genitourinary:  Negative for hematuria.     EKGs/Labs/Other Test Reviewed:    EKG:  SR rate 54 normal 10/26/21   Recent Labs: 09/07/2021: TSH 0.418 10/26/2021: ALT 15; BUN 13; Creatinine, Ser 1.12; Hemoglobin 12.8; Platelets 180; Potassium 3.7; Sodium 137   Recent Lipid Panel Recent Labs    09/06/21 1230 11/17/21 0744  CHOL 168 189  TRIG 164* 130  HDL 38* 46  VLDL 33  --   LDLCALC 97 120*     Risk Assessment/Calculations/Metrics:              Physical Exam:    VS:  BP 116/64   Pulse 64   Ht 6' (1.829 m)   Wt 184 lb 12.8 oz (83.8 kg)   SpO2 99%   BMI 25.06 kg/m     Wt Readings from Last 3 Encounters:  03/13/22 184 lb 12.8 oz (83.8 kg)  11/03/21 190 lb (86.2 kg)   10/19/21 192 lb 8 oz (87.3 kg)    Affect appropriate Healthy:  appears stated age HEENT: normal Neck supple with no adenopathy JVP normal no bruits no thyromegaly Lungs clear with no wheezing and good diaphragmatic motion Heart:  S1/S2 no murmur, no rub, gallop or click PMI normal Abdomen: benighn, BS positve, no tenderness, no AAA no bruit.  No HSM or HJR Distal pulses intact with no bruits No edema Neuro non-focal Skin warm and dry No muscular weakness  ASSESSMENT & PLAN:   Coronary artery disease of native artery of native heart with stable angina pectoris ([place]) History of remote myocardial infarction.  He is status post DES to the LCx and 2/21.  Cardiac catheterization in 2022 demonstrated patent stent in the LCx.  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  Myovue 10/26/21 normal For now, continue clopidogrel 75 mg daily, nebivolol 2.5 mg daily, ranolazine 500 mg twice daily, rosuvastatin 5 mg daily.      Essential hypertension The patient's blood pressure is controlled on his current regimen.  Continue current therapy.    Hyperlipidemia LDL goal <70 Crestor 5 mg LDL 120 stable   Aortic atherosclerosis (HCC) Continue clopidogrel 75 mg daily, rosuvastatin 5 mg daily.   GE reflux Recent colon and EGD ok some gastritis On protonix f/u Schooler His eating habits are not helping Likely esophageal dysmotility     Dispo: F/U in a year   Medication Adjustments/Labs and Tests Ordered: Current medicines are reviewed at length with the patient today.  Concerns regarding medicines are outlined above.  Tests Ordered: No orders of the defined types were placed in this encounter.  Medication Changes: No orders of the defined types were placed in this encounter.  Signed, Johnny Rouge, MD  03/13/2022 11:24 AM    [facility] [address] [place], [place], [place]  [address] Phone: [phone]; Fax: [phone]

## 2022-03-13 ENCOUNTER — Encounter: Payer: Self-pay | Admitting: Cardiovascular Disease

## 2022-03-13 ENCOUNTER — Ambulatory Visit: Payer: Medicare Other | Attending: Cardiovascular Disease | Admitting: Cardiovascular Disease

## 2022-03-13 VITALS — BP 116/64 | HR 64 | Ht 72.0 in | Wt 184.8 lb

## 2022-03-13 DIAGNOSIS — I10 Essential (primary) hypertension: Secondary | ICD-10-CM

## 2022-03-13 DIAGNOSIS — I25118 Atherosclerotic heart disease of native coronary artery with other forms of angina pectoris: Secondary | ICD-10-CM | POA: Diagnosis not present

## 2022-03-13 DIAGNOSIS — E785 Hyperlipidemia, unspecified: Secondary | ICD-10-CM

## 2022-03-13 DIAGNOSIS — K219 Gastro-esophageal reflux disease without esophagitis: Secondary | ICD-10-CM | POA: Diagnosis not present

## 2022-03-13 NOTE — Patient Instructions (Signed)
Medication Instructions:  Your physician recommends that you continue on your current medications as directed. Please refer to the Current Medication list given to you today.  *If you need a refill on your cardiac medications before your next appointment, please call your pharmacy*  Lab Work: If you have labs (blood work) drawn today and your tests are completely normal, you will receive your results only by: MyChart Message (if you have MyChart) OR A paper copy in the mail If you have any lab test that is abnormal or we need to change your treatment, we will call you to review the results.  Testing/Procedures: None ordered today.  Follow-Up: At [facility], you and your health needs are our priority.  As part of our continuing mission to provide you with exceptional heart care, we have created designated Provider Care Teams.  These Care Teams include your primary Cardiologist (physician) and Advanced Practice Providers (APPs -  Physician Assistants and Nurse Practitioners) who all work together to provide you with the care you need, when you need it.  We recommend signing up for the patient portal called "MyChart".  Sign up information is provided on this After Visit Summary.  MyChart is used to connect with patients for Virtual Visits (Telemedicine).  Patients are able to view lab/test results, encounter notes, upcoming appointments, etc.  Non-urgent messages can be sent to your provider as well.   To learn more about what you can do with MyChart, go to [email or web address].    Your next appointment:   1 year(s)  The format for your next appointment:   In Person  Provider:   Peter Nishan, MD     Important Information About Sugar       

## 2022-03-21 ENCOUNTER — Other Ambulatory Visit (HOSPITAL_COMMUNITY): Payer: Self-pay

## 2022-03-21 ENCOUNTER — Other Ambulatory Visit (HOSPITAL_BASED_OUTPATIENT_CLINIC_OR_DEPARTMENT_OTHER): Payer: Self-pay

## 2022-03-21 MED ORDER — HYDROCODONE-ACETAMINOPHEN 10-325 MG PO TABS
1.0000 | ORAL_TABLET | ORAL | 0 refills | Status: DC | PRN
  Filled 2022-03-21 – 2022-03-22 (×2): qty 180, 30d supply, fill #0

## 2022-03-22 ENCOUNTER — Other Ambulatory Visit (HOSPITAL_BASED_OUTPATIENT_CLINIC_OR_DEPARTMENT_OTHER): Payer: Self-pay

## 2022-04-10 ENCOUNTER — Ambulatory Visit: Payer: Medicare Other | Admitting: Internal Medicine

## 2022-04-10 ENCOUNTER — Encounter: Payer: Self-pay | Admitting: Internal Medicine

## 2022-04-10 LAB — BASIC METABOLIC PANEL
BUN: 13 mg/dL (ref 6–23)
CO2: 28 mEq/L (ref 19–32)
Calcium: 9.9 mg/dL (ref 8.4–10.5)
Chloride: 102 mEq/L (ref 96–112)
Creatinine, Ser: 1.1 mg/dL (ref 0.40–1.50)
GFR: 68.6 mL/min (ref >60.00)
Glucose, Bld: 82 mg/dL (ref 70–99)
Potassium: 4.2 mEq/L (ref 3.5–5.1)
Sodium: 135 mEq/L (ref 135–145)

## 2022-04-10 LAB — ALBUMIN: Albumin: 4.5 g/dL (ref 3.5–5.2)

## 2022-04-10 LAB — VITAMIN D 25 HYDROXY (VIT D DEFICIENCY, FRACTURES): VITD: 21.06 ng/mL — ABNORMAL LOW (ref 30.00–100.00)

## 2022-04-10 NOTE — Progress Notes (Unsigned)
Name: Johnny [name]  MRN/ DOB: [id number], 10-08-1950    Age/ Sex: 71 y.o., male    PCP: [name], MD   Reason for Endocrinology Evaluation: Hypercalcemia      Date of Initial Endocrinology Evaluation: 04/10/2022     HPI: Johnny [name] is a 71 y.o. male with a past medical history of HTN, Dyslipidemia and CAD. The patient presented for initial endocrinology clinic visit on 04/10/2022 for consultative assistance with his Hypercalcemia .   Johnny [name] indicates that he was first diagnosed with hypercalcemia in 2019, with a maximum level of 10.8 mg/DL in 07/14/2021 ( Uncorrected, no concomitant albumin). Of note the patient had normal PTH at 55 PG/mL 07/14/2021 as well as normal GFR.    Since that time, he has experienced symptoms of constipation, polyuria, polydipsia. He denies use of over the counter calcium (including supplements, Tums, Rolaids, or other calcium containing antacids), lithium, HCTZ, or vitamin D supplements.    He has history of kidney stones, last episode > 2 yrs ago. No  kidney disease, liver disease, granulomatous disease. He denies osteoporosis or prior fractures as an adult. Daily dietary calcium intake: 1 servings . He has family history of osteoporosis ( Mother) , parathyroid disease, thyroid disease.        HISTORY:  Past Medical History:  Past Medical History:  Diagnosis Date   Adenomatous polyps    Bronchitis    CAD in native artery 2017   Chronic back pain    GERD (gastroesophageal reflux disease)    Hyperlipidemia    Hypertension    Internal hemorrhoids    Low testosterone    Myocardial infarction ([place])    Osteopenia    Shortness of breath dyspnea    Spondylosis    TIA (transient ischemic attack) 2008   Ureterolithiasis    Ventral hernia    Vitamin D deficiency    Past Surgical History:  Past Surgical History:  Procedure Laterality Date   BACK SURGERY     2 surgeries   CARDIAC CATHETERIZATION N/A 09/12/2015   Procedure:  Left Heart Cath and Coronary Angiography;  Surgeon: Lorretta Harp, MD;  Location: [facility];  Service: Cardiovascular;  Laterality: N/A;   CORONARY STENT INTERVENTION N/A 06/23/2019   Procedure: CORONARY STENT INTERVENTION;  Surgeon: Sherren Mocha, MD;  Location: [facility];  Service: Cardiovascular;  Laterality: N/A;   LEFT HEART CATH AND CORONARY ANGIOGRAPHY N/A 01/27/2018   Procedure: LEFT HEART CATH AND CORONARY ANGIOGRAPHY;  Surgeon: Jettie Booze, MD;  Location: [facility];  Service: Cardiovascular;  Laterality: N/A;   LEFT HEART CATH AND CORONARY ANGIOGRAPHY N/A 06/23/2019   Procedure: LEFT HEART CATH AND CORONARY ANGIOGRAPHY;  Surgeon: Sherren Mocha, MD;  Location: [facility];  Service: Cardiovascular;  Laterality: N/A;   LEFT HEART CATH AND CORONARY ANGIOGRAPHY N/A 06/24/2020   Procedure: LEFT HEART CATH AND CORONARY ANGIOGRAPHY;  Surgeon: Troy Sine, MD;  Location: [facility];  Service: Cardiovascular;  Laterality: N/A;    Social History:  reports that he quit smoking about 44 years ago. His smoking use included cigarettes. He has never used smokeless tobacco. He reports that he does not drink alcohol and does not use drugs. Family History: family history includes Emphysema in his father; Heart attack in his mother; Heart disease in his mother; Stroke in his mother.   HOME MEDICATIONS: Allergies as of 04/10/2022  Reactions   Lipitor [atorvastatin] Other (See Comments)   Myalgia   Praluent [alirocumab] Other (See Comments)   Muscle pain and flu like symptoms   Crestor [rosuvastatin] Other (See Comments)   Myalgia   Imdur [isosorbide Nitrate] Other (See Comments)   Severe Headaches   Hydrocodone Nausea Only   Can take with food   Levaquin [levofloxacin] Nausea And Vomiting   Metadate Cd [methylphenidate Hcl] Other (See Comments)   Burning, GI upset, bloating   Morphine And Related Nausea And Vomiting   Pravachol  [pravastatin] Other (See Comments)   myalgia        Medication List        Accurate as of April 10, 2022 10:34 AM. If you have any questions, ask your nurse or doctor.          butalbital-acetaminophen-caffeine 50-325-40 MG tablet Commonly known as: FIORICET Take 1 tablet by mouth every 6 (six) hours as needed for headache.   clopidogrel 75 MG tablet Commonly known as: PLAVIX Take 1 tablet (75 mg total) by mouth daily.   cyanocobalamin 500 MCG tablet Commonly known as: VITAMIN B12 Take 1 tablet (500 mcg total) by mouth daily.   HYDROcodone-acetaminophen 10-325 MG tablet Commonly known as: NORCO Take by mouth as needed for pain.   HYDROcodone-acetaminophen 10-325 MG tablet Commonly known as: NORCO Take 1 tablet by mouth every 4 (four) hours as needed for pain.   HYDROcodone-acetaminophen 10-325 MG tablet Commonly known as: NORCO Take 1 tablet by mouth every four hours as needed for pain.   nebivolol 2.5 MG tablet Commonly known as: BYSTOLIC TAKE 1 TABLET BY MOUTH EVERY DAY   nitroGLYCERIN 0.4 MG SL tablet Commonly known as: NITROSTAT Place 1 tablet (0.4 mg total) under the tongue every 5 (five) minutes as needed for chest pain.   ranolazine 500 MG 12 hr tablet Commonly known as: RANEXA TAKE 1 TABLET BY MOUTH TWICE A DAY   rosuvastatin 5 MG tablet Commonly known as: CRESTOR Take 1 tablet (5 mg total) by mouth daily.   sucralfate 1 g tablet Commonly known as: CARAFATE Take 1 g by mouth 2 (two) times daily as needed. Stomache   traZODone 50 MG tablet Commonly known as: DESYREL Take 50 mg by mouth 3 times/day as needed-between meals & bedtime for sleep.          REVIEW OF SYSTEMS: A comprehensive ROS was conducted with the patient and is negative except as per HPI and below:  ROS     OBJECTIVE:  VS: BP (!) 140/70 (BP Location: Left Arm, Patient Position: Sitting, Cuff Size: Small)   Pulse 77   Ht 6' (1.829 m)   Wt 184 lb (83.5 kg)   SpO2  99%   BMI 24.95 kg/m    Wt Readings from Last 3 Encounters:  04/10/22 184 lb (83.5 kg)  03/13/22 184 lb 12.8 oz (83.8 kg)  11/03/21 190 lb (86.2 kg)     EXAM: General: Pt appears well and is in NAD  Eyes: External eye exam normal without stare, lid lag or exophthalmos.  EOM intact.  PERRL.  Neck: General: Supple without adenopathy. Thyroid: Thyroid size normal.  No goiter or nodules appreciated. No thyroid bruit.  Lungs: Clear with good BS bilat with no rales, rhonchi, or wheezes  Heart: Auscultation: RRR.  Abdomen: Normoactive bowel sounds, soft, nontender, without masses or organomegaly palpable  Extremities:  BL LE: No pretibial edema normal ROM and strength.  Mental Status: Judgment, insight: Intact Orientation:  Oriented to time, place, and person Mood and affect: No depression, anxiety, or agitation     DATA REVIEWED: ***    ASSESSMENT/PLAN/RECOMMENDATIONS:   Hypercalcemia:    Medications :  Signed electronically by: Mack Guise, MD  [facility]  [address] [place], [place] [address] Phone: [phone] FAX: [phone]   CC: [name], MD [address] [address] Phone: [phone] Fax: [phone]   Return to Endocrinology clinic as below: Future Appointments  Date Time Provider Ashley  04/26/2022 11:15 AM Suzzanne Cloud, NP [facility] None  07/09/2022  9:00 AM [facility] M[facility]-M[facility] MJB  07/13/2022 10:00 AM Baxley, Cresenciano Lick, MD [facility] MJB

## 2022-04-10 NOTE — Patient Instructions (Signed)
Stay Hydrated  Avoid over the counter calcium tablets  Make sure you consume 2-3 servings of calcium through foods ( low fat milk, cheese, yogurt and green leafy vegetables )    24-Hour Urine Collection  You will be collecting your urine for a 24-hour period of time. Your timer starts with your first urine of the morning (For example - If you first pee at [place], your timer will start at [place]) [place] away your first urine of the morning Collect your urine every time you pee for the next 24 hours STOP your urine collection 24 hours after you started the collection (For example - You would stop at 9AM the day after you started)

## 2022-04-11 LAB — CALCIUM, IONIZED: Calcium, Ion: 5.1 mg/dL (ref 4.7–5.5)

## 2022-04-11 LAB — PARATHYROID HORMONE, INTACT (NO CA): PTH: 46 pg/mL (ref 16–77)

## 2022-04-17 ENCOUNTER — Other Ambulatory Visit (HOSPITAL_BASED_OUTPATIENT_CLINIC_OR_DEPARTMENT_OTHER): Payer: Self-pay

## 2022-04-18 ENCOUNTER — Other Ambulatory Visit (HOSPITAL_BASED_OUTPATIENT_CLINIC_OR_DEPARTMENT_OTHER): Payer: Self-pay

## 2022-04-18 MED ORDER — HYDROCODONE-ACETAMINOPHEN 10-325 MG PO TABS
1.0000 | ORAL_TABLET | ORAL | 0 refills | Status: DC | PRN
  Filled 2022-05-17: qty 180, 30d supply, fill #0

## 2022-04-18 MED ORDER — HYDROCODONE-ACETAMINOPHEN 10-325 MG PO TABS
1.0000 | ORAL_TABLET | ORAL | 0 refills | Status: DC | PRN
  Filled 2022-04-19: qty 180, 30d supply, fill #0

## 2022-04-19 ENCOUNTER — Other Ambulatory Visit (HOSPITAL_BASED_OUTPATIENT_CLINIC_OR_DEPARTMENT_OTHER): Payer: Self-pay

## 2022-04-23 ENCOUNTER — Other Ambulatory Visit: Payer: Medicare Other

## 2022-04-23 NOTE — Progress Notes (Unsigned)
Total volume 2,300. Started 04-22-22 at 11:30a.m. ended 04-23-2022 at 11:30 a.m.

## 2022-04-24 LAB — CREATININE, URINE, 24 HOUR: Creatinine, 24H Ur: 1.15 g/(24.h) (ref 0.50–2.15)

## 2022-04-24 LAB — CALCIUM, URINE, 24 HOUR: Calcium, 24H Urine: 120 mg/24 h

## 2022-04-26 ENCOUNTER — Ambulatory Visit: Payer: Medicare Other | Admitting: Neurology

## 2022-05-07 ENCOUNTER — Other Ambulatory Visit: Payer: Self-pay

## 2022-05-16 ENCOUNTER — Other Ambulatory Visit (HOSPITAL_BASED_OUTPATIENT_CLINIC_OR_DEPARTMENT_OTHER): Payer: Self-pay

## 2022-05-17 ENCOUNTER — Other Ambulatory Visit (HOSPITAL_BASED_OUTPATIENT_CLINIC_OR_DEPARTMENT_OTHER): Payer: Self-pay

## 2022-06-13 ENCOUNTER — Other Ambulatory Visit (HOSPITAL_BASED_OUTPATIENT_CLINIC_OR_DEPARTMENT_OTHER): Payer: Self-pay

## 2022-07-09 ENCOUNTER — Other Ambulatory Visit: Payer: Medicare Other

## 2022-07-09 DIAGNOSIS — I10 Essential (primary) hypertension: Secondary | ICD-10-CM

## 2022-07-09 DIAGNOSIS — R7302 Impaired glucose tolerance (oral): Secondary | ICD-10-CM

## 2022-07-09 DIAGNOSIS — E782 Mixed hyperlipidemia: Secondary | ICD-10-CM

## 2022-07-09 DIAGNOSIS — N401 Enlarged prostate with lower urinary tract symptoms: Secondary | ICD-10-CM

## 2022-07-09 NOTE — Progress Notes (Signed)
Annual Wellness Visit    Patient Care Team: Julian Askin, Cresenciano Lick, MD as PCP - General (Internal Medicine) [name], MD as PCP - Cardiology (Cardiology) Everlene Farrier Loura Back, MD as Consulting Physician (Family Medicine) Garvin Fila, MD as Consulting Physician (Neurology) Michael Boston, MD as Consulting Physician (General Surgery) Wilford Corner, MD as Consulting Physician (Gastroenterology)  Visit Date: 07/13/22   Chief Complaint  Patient presents with   Medicare Wellness   Annual Exam    Subjective:   Patient: Johnny [name], Male    DOB: April 03, 1951, 72 y.o.   MRN: [id number]  Johnny [name] is a 72 y.o. Male who presents today for his Annual Wellness Visit. He has a history of adenomatous polyps, bronchitis, coronary artery disease in native artery, chronic back pain, GERD, hyperlipidemia, hypertension, osteopenia, internal hemorrhoids, myocardial infarction, spondylosis, transient ischemic attack, ureterolithiasis, ventral hernia, Vitamin D deficiency.   Seen in ED on 10/02/21 for atypical chest pain. Chest X-ray showed no radiographic evidence of acute cardiopulmonary disease, areas of fibrosis or chronic post infectious or inflammatory scarring again noted throughout the lung bases bilaterally, similar to the prior study, emphysema, aortic atherosclerosis. EKG showed no acute ST/T changes. Treated conservatively with nitroglycerin. Discharged in stable condition.  Seen again on 10/26/21 for chest pain. Chest X-ray showed no active cardiopulmonary disease. EKG showed sinus bradycardia, possible anterior infarct. Treated conservatively with GI cocktail with some improvement. Stress test in ED showed low risk, some ST depressions during test. Chain pain improved upon discharge.  Reports feeling generally well other than the added stress from 5 grandchildren moving in with him.  History of hyperlipidemia treated with Crestor 5 mg daily. CHOL elevated at 218, HDL low at 38, LDL  at 134, TRIG at 325 on 07/09/22. Has missed some doses. Has been eating junk food.  History of hypertension treated with Bystolic 2.5 mg daily. Blood pressure normal today at 110/68.  History of chronic angina, treated with Ranexa 500 mg twice daily, nitroglycerin 0.4 mg every 5 minutes as needed for chest pain.  History of GERD treated with Carafate 1 g twice daily as needed. Stomach biopsy on 01/01/22 showed chronic inactive gastritis, no helicobacter pylori organisms seen on H and E stain.  History of depression treated with Desyrel 50 mg 3 times daily as needed between meals and at bedtime.  History of transient ischemic attack treated with Plavix 75 mg daily.  History of headaches treated with Fioricet 50-325-40 mg every 6 hours as needed.  Vitamin B12 deficiency treated with Vitamin B-12 500 mcg daily.  PSA normal at 0.63 on 07/09/22.  Colonoscopy last completed 01/01/22. Results show preparation of colon was fair, one 4 mm tubular adenoma removed, internal hemorrhoids. Recommended repeat in 2033.   Past Medical History:  Diagnosis Date   Adenomatous polyps    Bronchitis    CAD in native artery 2017   Chronic back pain    GERD (gastroesophageal reflux disease)    Hyperlipidemia    Hypertension    Internal hemorrhoids    Low testosterone    Myocardial infarction (HCC)    Osteopenia    Shortness of breath dyspnea    Spondylosis    TIA (transient ischemic attack) 2008   Ureterolithiasis    Ventral hernia    Vitamin D deficiency      Family History  Problem Relation Age of Onset   Heart disease Mother    Stroke Mother    Heart attack Mother  Emphysema Father      Social History   Social History Narrative   Lives at home with his wife.   Right-handed.   2 cups caffeine per day.      Social history: He is married.  He had a son involved in a serious motor vehicle accident which took over a year to recover.  His wife has multiple sclerosis and is disabled.  He is  a retired Agricultural consultant.  He does not smoke.  Very occasional alcohol consumption.  2 other adult children.         Review of Systems  Constitutional:  Negative for chills, fever, malaise/fatigue and weight loss.  HENT:  Negative for hearing loss, sinus pain and sore throat.   Respiratory:  Negative for cough and hemoptysis.   Cardiovascular:  Negative for chest pain, palpitations, leg swelling and PND.  Gastrointestinal:  Negative for abdominal pain, constipation, diarrhea, heartburn, nausea and vomiting.  Genitourinary:  Negative for dysuria, frequency and urgency.  Musculoskeletal:  Negative for back pain, myalgias and neck pain.  Skin:  Negative for itching and rash.  Neurological:  Negative for dizziness, tingling, seizures and headaches.  Endo/Heme/Allergies:  Negative for polydipsia.  Psychiatric/Behavioral:  Negative for depression. The patient is not nervous/anxious.       Objective:   Vitals: BP 110/68   Pulse 79   Temp 98.7 F (37.1 C) (Tympanic)   Ht '5\' 11"'$  (1.803 m)   Wt 184 lb 1.9 oz (83.5 kg)   SpO2 99%   BMI 25.68 kg/m   Physical Exam Vitals and nursing note reviewed.  Constitutional:      General: He is awake. He is not in acute distress.    Appearance: Normal appearance. He is not ill-appearing or toxic-appearing.  HENT:     Head: Normocephalic and atraumatic.     Right Ear: Tympanic membrane, ear canal and external ear normal.     Left Ear: Tympanic membrane, ear canal and external ear normal.     Mouth/Throat:     Pharynx: Oropharynx is clear.  Eyes:     Extraocular Movements: Extraocular movements intact.     Pupils: Pupils are equal, round, and reactive to light.  Neck:     Thyroid: No thyroid mass, thyromegaly or thyroid tenderness.     Vascular: No carotid bruit.  Cardiovascular:     Rate and Rhythm: Normal rate and regular rhythm. No extrasystoles are present.    Pulses: Normal pulses.          Dorsalis pedis pulses are 2+ on the right side and  2+ on the left side.       Posterior tibial pulses are 2+ on the right side and 2+ on the left side.     Heart sounds: Normal heart sounds. No murmur heard.    No friction rub. No gallop.  Pulmonary:     Effort: Pulmonary effort is normal.     Breath sounds: Normal breath sounds. No decreased breath sounds, wheezing, rhonchi or rales.  Chest:     Chest wall: No mass.  Abdominal:     Palpations: Abdomen is soft.     Tenderness: There is no abdominal tenderness.     Hernia: No hernia is present.  Genitourinary:    Prostate: Normal. Not enlarged and no nodules present.     Comments: Prostate smooth and symmetrical upon examination. Musculoskeletal:     Cervical back: Normal range of motion.     Right lower leg: No  edema.     Left lower leg: No edema.     Comments: Birthmark on left lower leg.  Lymphadenopathy:     Cervical: No cervical adenopathy.     Upper Body:     Right upper body: No supraclavicular adenopathy.     Left upper body: No supraclavicular adenopathy.  Skin:    General: Skin is warm and dry.  Neurological:     General: No focal deficit present.     Mental Status: He is alert and oriented to person, place, and time. Mental status is at baseline.     Cranial Nerves: Cranial nerves 2-12 are intact.     Sensory: Sensation is intact.     Motor: Motor function is intact.     Coordination: Coordination is intact.     Gait: Gait is intact.     Deep Tendon Reflexes: Reflexes are normal and symmetric.  Psychiatric:        Attention and Perception: Attention normal.        Mood and Affect: Mood normal.        Speech: Speech normal.        Behavior: Behavior normal. Behavior is cooperative.        Thought Content: Thought content normal.        Cognition and Memory: Cognition and memory normal.        Judgment: Judgment normal.      Most recent functional status assessment:    07/13/2022    9:51 AM  In your present state of health, do you have any difficulty  performing the following activities:  Hearing? 0  Vision? 0  Difficulty concentrating or making decisions? 1  Walking or climbing stairs? 0  Dressing or bathing? 0  Doing errands, shopping? 0  Preparing Food and eating ? N  Using the Toilet? N  In the past six months, have you accidently leaked urine? N  Do you have problems with loss of bowel control? N  Managing your Medications? N  Managing your Finances? N  Housekeeping or managing your Housekeeping? N   Most recent fall risk assessment:    07/13/2022    9:50 AM  Fall Risk   Falls in the past year? 0  Number falls in past yr: 0  Injury with Fall? 0  Risk for fall due to : No Fall Risks  Follow up Falls prevention discussed    Most recent depression screenings:    07/13/2022    9:50 AM 07/10/2021   11:13 AM  PHQ 2/9 Scores  PHQ - 2 Score 0 0   Most recent cognitive screening:    07/13/2022    9:52 AM  6CIT Screen  What Year? 0 points  What month? 0 points  What time? 0 points  Count back from 20 0 points  Months in reverse 4 points  Repeat phrase 0 points  Total Score 4 points     Results:   Studies obtained and personally reviewed by me:  Colonoscopy last completed 01/01/22. Results show preparation of colon was fair, one 4 mm tubular adenoma removed, internal hemorrhoids. Recommended repeat in 2033.   Labs:       Component Value Date/Time   NA 139 07/09/2022 0909   NA 135 07/26/2020 1207   K 4.6 07/09/2022 0909   CL 105 07/09/2022 0909   CO2 26 07/09/2022 0909   GLUCOSE 84 07/09/2022 0909   BUN 14 07/09/2022 0909   BUN 13 07/26/2020 1207  CREATININE 1.12 07/09/2022 0909   CALCIUM 10.0 07/09/2022 0909   PROT 6.9 07/09/2022 0909   PROT 6.4 01/19/2020 0808   ALBUMIN 4.5 04/10/2022 1059   ALBUMIN 4.5 01/19/2020 0808   AST 13 07/09/2022 0909   ALT 11 07/09/2022 0909   ALKPHOS 31 (L) 10/26/2021 0615   BILITOT 0.7 07/09/2022 0909   BILITOT 0.5 01/19/2020 0808   GFRNONAA >60 10/26/2021 0615    GFRNONAA 75 06/14/2020 0911   GFRAA 87 06/14/2020 0911     Lab Results  Component Value Date   WBC 6.3 07/09/2022   HGB 14.4 07/09/2022   HCT 41.3 07/09/2022   MCV 97.9 07/09/2022   PLT 204 07/09/2022    Lab Results  Component Value Date   CHOL 218 (H) 07/09/2022   HDL 38 (L) 07/09/2022   LDLCALC 134 (H) 07/09/2022   TRIG 325 (H) 07/09/2022   CHOLHDL 5.7 (H) 07/09/2022    Lab Results  Component Value Date   HGBA1C 5.1 07/09/2022     Lab Results  Component Value Date   TSH 0.418 09/07/2021     Lab Results  Component Value Date   PSA 0.63 07/09/2022   PSA 0.69 07/07/2021   PSA 0.57 06/14/2020    Assessment & Plan:   Hyperlipidemia: treated with Crestor 5 mg daily. CHOL elevated at 218, HDL low at 38, LDL at 134, TRIG at 325 on 07/09/22. Has missed some doses. Has been eating junk food. Work on eating healthy and exercising.  Hypertension: treated with Bystolic 2.5 mg daily. Blood pressure normal today at 110/68.  Chronic Angina: treated with Ranexa 500 mg twice daily, nitroglycerin 0.4 mg every 5 minutes as needed for chest pain.  GERD: treated with Carafate 1 g twice daily as needed. Stomach biopsy on 01/01/22 showed chronic inactive gastritis, no helicobacter pylori organisms seen on H and E stain.  Depression: treated with Desyrel 50 mg 3 times daily as needed between meals and at bedtime.  Transient ischemic attack: treated with Plavix 75 mg daily.  Headaches: treated with Fioricet 50-325-40 mg every 6 hours as needed.  Vitamin B12 deficiency: treated with Vitamin B-12 500 mcg daily.  Colonoscopy: last completed 01/01/22. Results show preparation of colon was fair, one 4 mm tubular adenoma removed, internal hemorrhoids. Recommended repeat in 2033.  Vaccine Counseling: Discussed shingles vaccine. Will go to pharmacy for tetanus vaccine.  Situational stress discussed. May benefit from counseling.   Return in 3 months for office visit and lipid check.       Annual wellness visit done today including the all of the following: Reviewed patient's Family Medical History Reviewed and updated list of patient's medical providers Assessment of cognitive impairment was done Assessed patient's functional ability Established a written schedule for health screening [place] Completed and Reviewed  Discussed health benefits of physical activity, and encouraged him to engage in regular exercise appropriate for his age and condition.        I,Alexander Ruley,acting as a Education administrator for [name], MD.,have documented all relevant documentation on the behalf of [name], MD,as directed by  [name], MD while in the presence of [name], MD.   I, [name], MD, have reviewed all documentation for this visit. The documentation on 07/16/22 for the exam, diagnosis, procedures, and orders are all accurate and complete.

## 2022-07-10 LAB — COMPLETE METABOLIC PANEL WITH GFR
AG Ratio: 1.8 (calc) (ref 1.0–2.5)
ALT: 11 U/L (ref 9–46)
AST: 13 U/L (ref 10–35)
Albumin: 4.4 g/dL (ref 3.6–5.1)
Alkaline phosphatase (APISO): 51 U/L (ref 35–144)
BUN: 14 mg/dL (ref 7–25)
CO2: 26 mmol/L (ref 20–32)
Calcium: 10 mg/dL (ref 8.6–10.3)
Chloride: 105 mmol/L (ref 98–110)
Creat: 1.12 mg/dL (ref 0.70–1.35)
Globulin: 2.5 g/dL (calc) (ref 1.9–3.7)
Glucose, Bld: 84 mg/dL (ref 65–99)
Potassium: 4.6 mmol/L (ref 3.5–5.3)
Sodium: 139 mmol/L (ref 135–146)
Total Bilirubin: 0.7 mg/dL (ref 0.2–1.2)
Total Protein: 6.9 g/dL (ref 6.1–8.1)
eGFR: 71 mL/min/{1.73_m2} (ref >=60)

## 2022-07-10 LAB — PSA: PSA: 0.63 ng/mL (ref <=4.00)

## 2022-07-10 LAB — CBC WITH DIFFERENTIAL/PLATELET
Absolute Monocytes: 529 cells/uL (ref 200–950)
Basophils Absolute: 38 cells/uL (ref 0–200)
Basophils Relative: 0.6 %
Eosinophils Absolute: 101 cells/uL (ref 15–500)
Eosinophils Relative: 1.6 %
HCT: 41.3 % (ref 38.5–50.0)
Hemoglobin: 14.4 g/dL (ref 13.2–17.1)
Lymphs Abs: 1663 cells/uL (ref 850–3900)
MCH: 34.1 pg — ABNORMAL HIGH (ref 27.0–33.0)
MCHC: 34.9 g/dL (ref 32.0–36.0)
MCV: 97.9 fL (ref 80.0–100.0)
MPV: 9.8 fL (ref 7.5–12.5)
Monocytes Relative: 8.4 %
Neutro Abs: 3969 cells/uL (ref 1500–7800)
Neutrophils Relative %: 63 %
Platelets: 204 10*3/uL (ref 140–400)
RBC: 4.22 10*6/uL (ref 4.20–5.80)
RDW: 12 % (ref 11.0–15.0)
Total Lymphocyte: 26.4 %
WBC: 6.3 10*3/uL (ref 3.8–10.8)

## 2022-07-10 LAB — LIPID PANEL
Cholesterol: 218 mg/dL — ABNORMAL HIGH (ref <200)
HDL: 38 mg/dL — ABNORMAL LOW (ref >=40)
LDL Cholesterol (Calc): 134 mg/dL (calc) — ABNORMAL HIGH
Non-HDL Cholesterol (Calc): 180 mg/dL (calc) — ABNORMAL HIGH (ref <130)
Total CHOL/HDL Ratio: 5.7 (calc) — ABNORMAL HIGH (ref <5.0)
Triglycerides: 325 mg/dL — ABNORMAL HIGH (ref <150)

## 2022-07-10 LAB — HEMOGLOBIN A1C
Hgb A1c MFr Bld: 5.1 % of total Hgb (ref <5.7)
Mean Plasma Glucose: 100 mg/dL
eAG (mmol/L): 5.5 mmol/L

## 2022-07-11 ENCOUNTER — Other Ambulatory Visit (HOSPITAL_COMMUNITY): Payer: Self-pay

## 2022-07-11 ENCOUNTER — Other Ambulatory Visit (HOSPITAL_BASED_OUTPATIENT_CLINIC_OR_DEPARTMENT_OTHER): Payer: Self-pay

## 2022-07-11 MED ORDER — HYDROCODONE-ACETAMINOPHEN 10-325 MG PO TABS
1.0000 | ORAL_TABLET | ORAL | 0 refills | Status: DC | PRN
  Filled 2022-07-11 – 2022-07-13 (×2): qty 180, 30d supply, fill #0

## 2022-07-12 ENCOUNTER — Encounter: Payer: Self-pay | Admitting: Pharmacist

## 2022-07-12 DIAGNOSIS — T380X5A Adverse effect of glucocorticoids and synthetic analogues, initial encounter: Secondary | ICD-10-CM

## 2022-07-12 NOTE — Progress Notes (Signed)
[facility])                                            [place] Team                                        Statin Quality Measure Assessment    07/12/2022  Johnny [name] Feb 26, 1951 [id number]    Per review of chart and payor information, patient has a diagnosis of cardiovascular disease but is not currently filling a statin prescription.  This places patient into the [facility] (Statin Use In Patients with Cardiovascular Disease) measure for CMS.    Patient has documented allergy to statin but no corresponding CPT codes that would exclude patient from [facility] measure.   Upcoming appt 07/13/22--if deemed therapeutically appropriate, please associate a statin exclusion code with the upcoming visit or consider and/or an alternative statin and increased statin dosing interval.      Component Value Date/Time   CHOL 218 (H) 07/09/2022 0909   CHOL 189 11/17/2021 0744   TRIG 325 (H) 07/09/2022 0909   HDL 38 (L) 07/09/2022 0909   HDL 46 11/17/2021 0744   CHOLHDL 5.7 (H) 07/09/2022 0909   VLDL 33 09/06/2021 1230   LDLCALC 134 (H) 07/09/2022 0909     Please consider ONE of the following recommendations:  Initiate high intensity statin Atorvastatin 54m once daily, #90, 3 refills   Rosuvastatin 235monce daily, #90, 3 refills    Initiate moderate intensity  statin with reduced frequency if prior  statin intolerance 1x weekly, #13, 3 refills   2x weekly, #26, 3 refills   3x weekly, #39, 3 refills    Code for past statin intolerance  (required annually)   Provider Requirements:  Must asociate code during an office visit or telehealth encounter   Drug Induced Myopathy G72.0   Myalgia M79.1   Myositis, unspecified M60.9   Myopathy, unspecified G72.9   Rhabdomyolysis M62.82     Plan: Route note to PCP prior to upcoming appointment.  KaElayne GuerinPharmD, BCCambrialinical Pharmacist [phone]

## 2022-07-13 ENCOUNTER — Ambulatory Visit (INDEPENDENT_AMBULATORY_CARE_PROVIDER_SITE_OTHER): Payer: Medicare Other | Admitting: Internal Medicine

## 2022-07-13 ENCOUNTER — Encounter: Payer: Self-pay | Admitting: Internal Medicine

## 2022-07-13 ENCOUNTER — Other Ambulatory Visit (HOSPITAL_COMMUNITY): Payer: Self-pay

## 2022-07-13 VITALS — BP 110/68 | HR 79 | Temp 98.7°F | Ht 71.0 in | Wt 184.1 lb

## 2022-07-13 DIAGNOSIS — K2100 Gastro-esophageal reflux disease with esophagitis, without bleeding: Secondary | ICD-10-CM | POA: Diagnosis not present

## 2022-07-13 DIAGNOSIS — I25118 Atherosclerotic heart disease of native coronary artery with other forms of angina pectoris: Secondary | ICD-10-CM

## 2022-07-13 DIAGNOSIS — E782 Mixed hyperlipidemia: Secondary | ICD-10-CM | POA: Diagnosis not present

## 2022-07-13 DIAGNOSIS — Z0000 Encounter for general adult medical examination without abnormal findings: Secondary | ICD-10-CM

## 2022-07-13 DIAGNOSIS — I252 Old myocardial infarction: Secondary | ICD-10-CM | POA: Diagnosis not present

## 2022-07-13 DIAGNOSIS — F439 Reaction to severe stress, unspecified: Secondary | ICD-10-CM

## 2022-07-13 DIAGNOSIS — Z87442 Personal history of urinary calculi: Secondary | ICD-10-CM

## 2022-07-13 DIAGNOSIS — I10 Essential (primary) hypertension: Secondary | ICD-10-CM

## 2022-07-13 LAB — POCT URINALYSIS DIPSTICK
Bilirubin, UA: NEGATIVE
Blood, UA: NEGATIVE
Glucose, UA: NEGATIVE
Ketones, UA: NEGATIVE
Leukocytes, UA: NEGATIVE
Nitrite, UA: NEGATIVE
Protein, UA: NEGATIVE
Spec Grav, UA: 1.01 (ref 1.010–1.025)
Urobilinogen, UA: 0.2 E.U./dL
pH, UA: 6 (ref 5.0–8.0)

## 2022-07-16 ENCOUNTER — Telehealth: Payer: Self-pay

## 2022-07-16 NOTE — Patient Instructions (Addendum)
Consider counseling for situational stress. Need to get on track with lipid management with hx of heart disease. Obtain tetanus vaccine at pharmacy. RTC in 3 months to follow up on lipids.

## 2022-07-16 NOTE — Telephone Encounter (Addendum)
Called patient but he was unable so his wife took the call ok per DPR,  she stated patient has not been taking  rosuvastatin (CRESTOR) 5 MG tablet  because he had doubts about taking it.  The wife also stated he has been trying to change his eating habits and plans to start taking rosuvastatin (CRESTOR) 5 MG tablet again.

## 2022-07-29 ENCOUNTER — Other Ambulatory Visit: Payer: Self-pay | Admitting: Cardiovascular Disease

## 2022-07-29 DIAGNOSIS — I10 Essential (primary) hypertension: Secondary | ICD-10-CM

## 2022-07-31 ENCOUNTER — Ambulatory Visit: Payer: Medicare Other | Admitting: Neurology

## 2022-07-31 ENCOUNTER — Telehealth: Payer: Self-pay | Admitting: Neurology

## 2022-07-31 ENCOUNTER — Encounter: Payer: Self-pay | Admitting: Neurology

## 2022-07-31 VITALS — BP 124/71 | HR 60 | Ht 71.0 in | Wt 184.0 lb

## 2022-07-31 DIAGNOSIS — G43109 Migraine with aura, not intractable, without status migrainosus: Secondary | ICD-10-CM | POA: Diagnosis not present

## 2022-07-31 DIAGNOSIS — F03A4 Unspecified dementia, mild, with anxiety: Secondary | ICD-10-CM | POA: Diagnosis not present

## 2022-07-31 MED ORDER — BUTALBITAL-APAP-CAFFEINE 50-325-40 MG PO TABS: 1.0000 | ORAL_TABLET | Freq: Four times a day (QID) | ORAL | 5 refills | Status: DC | PRN

## 2022-07-31 NOTE — Patient Instructions (Signed)
I will place a referral for audiology, [facility] We need to monitor your memory long term, very closely monitor the driving  See you back in 6 months

## 2022-07-31 NOTE — Telephone Encounter (Signed)
Referral for psychiatry fax to [facility]. Phone:[phone], Fax: [phone] 

## 2022-07-31 NOTE — Progress Notes (Signed)
Chief Complaint  Patient presents with   Follow-up    Rm 17,  2-3 migraines a month    ASSESSMENT AND PLAN  Johnny [name] is a 72 y.o. male    1.  Chronic migraine headaches 2.  Episode of worsening confusion, word finding difficulties September 06, 2021 3.  Cognitive impairment, likely dementia, MoCA 14/30 today 4.  ADD 5.  Anxiety, stress -Headaches under fairly good control, Seems his underlying attention, mood issues impacting his memory to some degree -Referral to [place] attention specialist for management and evaluation of ADD -Referral to audiology to determine if underlying hearing problem impacting memory -We discussed neuropsychological evaluation, they wish to pursue the above first -Continue Fioricet as needed for migraine headache treatment -Continue trazodone 50 mg as needed for sleep -Closely monitor memory, driving ability -Follow-up in 6 months or sooner if needed  Meds ordered this encounter  Medications   butalbital-acetaminophen-caffeine (FIORICET) 50-325-40 MG tablet    Sig: Take 1 tablet by mouth every 6 (six) hours as needed for headache.    Dispense:  12 tablet    Refill:  5   DIAGNOSTIC DATA (LABS, IMAGING, TESTING) - I reviewed patient records, labs, notes, testing and imaging myself where available.  In 2023: B12 472, normal BMP, TSH, RPR, HIV, LDL 97.  MEDICAL HISTORY:  Johnny [name] 72 year old male,, seen in request by vascular neurologist Dr. Erlinda [name], Johnny [name], for complicated migraine headaches, his primary care physician is Dr. Tedra [name]   I reviewed and summarized the referring note. PMHx. HLD HTN CAD, on plavix Chronic low back pain,  B12 deficiency.  I saw him in 2016 for migraine with aura changes, he presented with right visual field change, that is accompanied by mild lateralized headache  MRI of the brain at that time showed mild small vessel disease,  He does report long history of chronic migraine headaches, often  preceded by right visual field change, but only happens rarely  On September 06, 2021, shortly after he woke up while talking his wife, he was found to be confused, difficulty getting his words out, also has mild headaches lasting for about 2 hours, but this time he did not have his usual visual changes that overall accompanied his migraine headache  He is a retired Airline pilot, reported excessive stress at home, wife suffered MS, and there are 7 other family members living with them over the past few months, which has caused a lot of anxiety on him, he often feels overwhelmed  His mother does suffer dementia, he was noted to have slow worsening memory loss over the past few years, word finding difficulties, which has made worse by his current stress, he has difficulty sleeping, today's MoCA examination is only 16/30  He has history of chronic artery disease, taking Plavix 75 mg, will be on hold for his epidural injection for low back pain, and colonoscopy  EEG on September 06, 2021 was normal  I personally reviewed MRI of the brain September 06, 2021, no acute abnormality, generalized atrophy, mild small vessel disease, progress since 2016  Laboratory evaluations showed normal or negative B12, troponin, BMP, TSH, RPR, B1, HIV, CBC, lipid panel LDL 97  Update July 31, 2022 SS: Here with his wife. No further events of confusion or speech difficulty since April 2023. Has about 2-3 migraines a month. He takes Scientist, research (physical sciences), it works well. Takes Trazodone 50 mg as needed, makes him tired the next day. His ADHD is not treated. On Plavix  75 mg daily. Chronic pain on hydrocodone from pain management. Needs hearing evaluated. He is active, drives a car, he walks about an hour daily. Has 6 grand kids and pets staying at their home. Still under a lot of stress. During moca seems to over think testing, anxious.  [place] 14/30.  PHYSICAL EXAM:   Vitals:   07/31/22 1358  BP: 124/71  Pulse: 60  Weight: 184 lb (83.5 kg)   Height: '5\' 11"'$  (1.803 m)   Body mass index is 25.66 kg/m.  Physical Exam  General: The patient is alert and cooperative at the time of the examination.  Skin: No significant peripheral edema is noted.  Neurologic Exam  Mental status: The patient is alert, most history is provided by his wife.  He has slowed processing and slow response time.  Has some trouble with exam commands.  Cranial nerves: Facial symmetry is present. Speech is normal, no aphasia or dysarthria is noted. Extraocular movements are full. Visual fields are full.  Motor: The patient has good strength in all 4 extremities.  Sensory examination: Soft touch sensation is symmetric on the face, arms, and legs.  Coordination: The patient has good finger-nose-finger and heel-to-shin bilaterally.  There is mild apraxia.  Gait and station: The patient has a normal gait.   Reflexes: Deep tendon reflexes are symmetric.       07/31/2022    4:30 PM 10/19/2021   11:00 AM  Montreal Cognitive Assessment   Visuospatial/ Executive (0/5) 1 1  Naming (0/3) 3 3  Attention: Read list of digits (0/2) 1 2  Attention: Read list of letters (0/1) 1 0  Attention: Serial 7 subtraction starting at 100 (0/3) 1 1  Language: Repeat phrase (0/2) 2 1  Language : Fluency (0/1) 1 1  Abstraction (0/2) 0 1  Delayed Recall (0/5) 0 0  Orientation (0/6) 4 6  Total 14 16     REVIEW OF SYSTEMS:  Full 14 system review of systems performed and notable only for as above All other review of systems were negative.   ALLERGIES: Allergies  Allergen Reactions   Lipitor [Atorvastatin] Other (See Comments)    Myalgia   Praluent [Alirocumab] Other (See Comments)    Muscle pain and flu like symptoms   Crestor [Rosuvastatin] Other (See Comments)    Myalgia   Imdur [Isosorbide Nitrate] Other (See Comments)    Severe Headaches   Hydrocodone Nausea Only    Can take with food   Levaquin [Levofloxacin] Nausea And Vomiting   Metadate Cd  [Methylphenidate Hcl] Other (See Comments)    Burning, GI upset, bloating   Morphine And Related Nausea And Vomiting   Pravachol [Pravastatin] Other (See Comments)    myalgia    HOME MEDICATIONS: Current Outpatient Medications  Medication Sig Dispense Refill   butalbital-acetaminophen-caffeine (FIORICET) 50-325-40 MG tablet Take 1 tablet by mouth every 6 (six) hours as needed for headache. 12 tablet 5   clopidogrel (PLAVIX) 75 MG tablet Take 1 tablet (75 mg total) by mouth daily. 90 tablet 2   HYDROcodone-acetaminophen (NORCO) 10-325 MG tablet Take by mouth as needed for pain.     HYDROcodone-acetaminophen (NORCO) 10-325 MG tablet Take 1 tablet by mouth every 4 (four) hours as needed for pain. 180 tablet 0   HYDROcodone-acetaminophen (NORCO) 10-325 MG tablet Take 1 tablet by mouth every 4 (four) hours as needed for pain. 180 tablet 0   HYDROcodone-acetaminophen (NORCO) 10-325 MG tablet Take 1 tablet by mouth every 4 (four) hours as  needed for pain. 180 tablet 0   nebivolol (BYSTOLIC) 2.5 MG tablet TAKE 1 TABLET BY MOUTH EVERY DAY 90 tablet 3   nitroGLYCERIN (NITROSTAT) 0.4 MG SL tablet Place 1 tablet (0.4 mg total) under the tongue every 5 (five) minutes as needed for chest pain. 25 tablet 1   ranolazine (RANEXA) 500 MG 12 hr tablet TAKE 1 TABLET BY MOUTH TWICE A DAY 180 tablet 3   sucralfate (CARAFATE) 1 g tablet Take 1 g by mouth 2 (two) times daily as needed. [place]     traZODone (DESYREL) 50 MG tablet Take 50 mg by mouth 3 times/day as needed-between meals & bedtime for sleep.     vitamin B-12 (CYANOCOBALAMIN) 500 MCG tablet Take 1 tablet (500 mcg total) by mouth daily. 30 tablet 3   rosuvastatin (CRESTOR) 5 MG tablet Take 1 tablet (5 mg total) by mouth daily. 90 tablet 3   No current facility-administered medications for this visit.    PAST MEDICAL HISTORY: Past Medical History:  Diagnosis Date   Adenomatous polyps    Bronchitis    CAD in native artery 2017   Chronic back pain     GERD (gastroesophageal reflux disease)    Hyperlipidemia    Hypertension    Internal hemorrhoids    Low testosterone    Myocardial infarction ([place])    Osteopenia    Shortness of breath dyspnea    Spondylosis    TIA (transient ischemic attack) 2008   Ureterolithiasis    Ventral hernia    Vitamin D deficiency     PAST SURGICAL HISTORY: Past Surgical History:  Procedure Laterality Date   BACK SURGERY     2 surgeries   CARDIAC CATHETERIZATION N/A 09/12/2015   Procedure: Left Heart Cath and Coronary Angiography;  Surgeon: Lorretta Harp, MD;  Location: [facility];  Service: Cardiovascular;  Laterality: N/A;   CORONARY STENT INTERVENTION N/A 06/23/2019   Procedure: CORONARY STENT INTERVENTION;  Surgeon: Sherren Mocha, MD;  Location: [facility];  Service: Cardiovascular;  Laterality: N/A;   LEFT HEART CATH AND CORONARY ANGIOGRAPHY N/A 01/27/2018   Procedure: LEFT HEART CATH AND CORONARY ANGIOGRAPHY;  Surgeon: Jettie Booze, MD;  Location: [facility];  Service: Cardiovascular;  Laterality: N/A;   LEFT HEART CATH AND CORONARY ANGIOGRAPHY N/A 06/23/2019   Procedure: LEFT HEART CATH AND CORONARY ANGIOGRAPHY;  Surgeon: Sherren Mocha, MD;  Location: [facility];  Service: Cardiovascular;  Laterality: N/A;   LEFT HEART CATH AND CORONARY ANGIOGRAPHY N/A 06/24/2020   Procedure: LEFT HEART CATH AND CORONARY ANGIOGRAPHY;  Surgeon: Troy Sine, MD;  Location: [facility];  Service: Cardiovascular;  Laterality: N/A;    FAMILY HISTORY: Family History  Problem Relation Age of Onset   Heart disease Mother    Stroke Mother    Heart attack Mother    Emphysema Father     SOCIAL HISTORY: Social History   Socioeconomic History   Marital status: Married    Spouse name: Not on file   Number of children: 4   Years of education: 14   Highest education level: Not on file  Occupational History   Occupation: Geophysical data processor  Tobacco Use   Smoking  status: Former    Years: 10.00    Types: Cigarettes    Quit date: 01/04/1978    Years since quitting: 44.6   Smokeless tobacco: Never  Vaping Use   Vaping Use: Never used  Substance and Sexual  Activity   Alcohol use: No    Alcohol/week: 0.0 standard drinks of alcohol    Comment: rarely   Drug use: No   Sexual activity: Not on file  Other Topics Concern   Not on file  Social History Narrative   Lives at home with his wife.   Right-handed.   2 cups caffeine per day.      Social history: He is married.  He had a son involved in a serious motor vehicle accident which took over a year to recover.  His wife has multiple sclerosis and is disabled.  He is a retired Agricultural consultant.  He does not smoke.  Very occasional alcohol consumption.  2 other adult children.       Social Determinants of Health   Financial Resource Strain: Not on file  Food Insecurity: Not on file  Transportation Needs: Not on file  Physical Activity: Not on file  Stress: Not on file  Social Connections: Not on file  Intimate Partner Violence: Not on file    Butler Denmark, Laqueta Jean, [facility] [address], [place], [place] [address] [phone]

## 2022-08-03 ENCOUNTER — Other Ambulatory Visit: Payer: Self-pay | Admitting: Cardiovascular Disease

## 2022-08-07 ENCOUNTER — Other Ambulatory Visit (HOSPITAL_BASED_OUTPATIENT_CLINIC_OR_DEPARTMENT_OTHER): Payer: Self-pay

## 2022-08-08 ENCOUNTER — Other Ambulatory Visit (HOSPITAL_BASED_OUTPATIENT_CLINIC_OR_DEPARTMENT_OTHER): Payer: Self-pay

## 2022-08-08 MED ORDER — HYDROCODONE-ACETAMINOPHEN 10-325 MG PO TABS
1.0000 | ORAL_TABLET | ORAL | 0 refills | Status: DC | PRN
  Filled 2022-08-10: qty 180, 30d supply, fill #0

## 2022-08-08 MED ORDER — HYDROCODONE-ACETAMINOPHEN 10-325 MG PO TABS
1.0000 | ORAL_TABLET | ORAL | 0 refills | Status: DC | PRN
  Filled 2022-09-07: qty 180, 30d supply, fill #0

## 2022-08-09 ENCOUNTER — Other Ambulatory Visit (HOSPITAL_BASED_OUTPATIENT_CLINIC_OR_DEPARTMENT_OTHER): Payer: Self-pay

## 2022-08-10 ENCOUNTER — Other Ambulatory Visit (HOSPITAL_BASED_OUTPATIENT_CLINIC_OR_DEPARTMENT_OTHER): Payer: Self-pay

## 2022-08-13 ENCOUNTER — Ambulatory Visit: Payer: Medicare Other | Attending: Audiologist | Admitting: Audiologist

## 2022-08-13 DIAGNOSIS — H903 Sensorineural hearing loss, bilateral: Secondary | ICD-10-CM

## 2022-08-13 NOTE — Procedures (Signed)
  Outpatient Audiology and [place], [place]  [id number] [phone]  AUDIOLOGICAL  EVALUATION  NAME: Johnny [name]     DOB:   05/11/1951      MRN: [id number]                                                                                     DATE: 08/13/2022     REFERENT: [name], MD STATUS: Outpatient DIAGNOSIS: Sensorineural Hearing Loss Bilateral    History: Ayham was seen for an audiological evaluation. Symeon was accompanied to the appointment by his wife Nayden is receiving a hearing evaluation due to concerns for not hearing people clearly. Daschel has difficulty hearing in noisy places. This difficulty began gradually. No pain or pressure reported in either ear. 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 It is not bothersome, he has learned to cope.  Medical history positive for mild cognitive impairment which is a risk factor for hearing loss. No other relevant case history reported.   Evaluation:  Otoscopy showed no view of the tympanic membranes due to cerumen, bilaterally Tympanometry results were consistent with normal middle ear function, bilaterally, sound is bypassing the cerumen   Audiometric testing was completed using conventional audiometry with supraural transducer. Speech Recognition Thresholds were 30 dB in the right ear and 25 dB in the left ear. Word Recognition was  performed  40dB SL, scored  100% in the right ear and 100% in the left ear. Pure tone thresholds show normal hearing sloping to moderate sensorineural hearing loss in both ears.   Results:  The test results were reviewed with [facility]. Xzavier has a moderate high pitched sensorineural hearing loss bilaterally. He needs hearing aids to hear clearly. The role of hearing aids and cognitive decline discussed. Patient willing to try hearing aids. Gave patient handout on how to use [employer].      Recommendations: Amplification is necessary for both ears. Hearing aids can be purchased from a variety of locations. See provided list for locations in the [place] area.  Debrox Earwax Removal Drops are a safe and inexpensive in-home solution for wax removal. Debrox Earwax Removal Kit includes a soft rubber bulb syringe to rinse your ear after using Debrox Earwax Removal Drops.   How to use the Debrox Earwax Removal Drops Kit:  tilt head sideways. place 5 to 10 drops into ear. tip of applicator should not enter ear canal. keep drops in ear for several minutes by keeping head tilted or placing cotton in the ear. use twice daily for up to four days  gently flush ear with water, using soft rubber bulb syringe after final treatment (on 4th day)     26 minutes spent testing and counseling on results.   Alfonse Alpers  Audiologist, Au.D., CCC-A 08/13/2022  2:23 PM  Cc: [name], MD

## 2022-08-16 ENCOUNTER — Ambulatory Visit: Payer: Medicare Other | Admitting: Internal Medicine

## 2022-09-03 ENCOUNTER — Other Ambulatory Visit (HOSPITAL_BASED_OUTPATIENT_CLINIC_OR_DEPARTMENT_OTHER): Payer: Self-pay

## 2022-09-04 ENCOUNTER — Other Ambulatory Visit (HOSPITAL_BASED_OUTPATIENT_CLINIC_OR_DEPARTMENT_OTHER): Payer: Self-pay

## 2022-09-07 ENCOUNTER — Other Ambulatory Visit (HOSPITAL_BASED_OUTPATIENT_CLINIC_OR_DEPARTMENT_OTHER): Payer: Self-pay

## 2022-10-01 ENCOUNTER — Emergency Department (HOSPITAL_BASED_OUTPATIENT_CLINIC_OR_DEPARTMENT_OTHER): Payer: Medicare Other

## 2022-10-01 ENCOUNTER — Telehealth: Payer: Self-pay | Admitting: Internal Medicine

## 2022-10-01 ENCOUNTER — Other Ambulatory Visit: Payer: Self-pay

## 2022-10-01 ENCOUNTER — Emergency Department (HOSPITAL_BASED_OUTPATIENT_CLINIC_OR_DEPARTMENT_OTHER)
Admission: EM | Admit: 2022-10-01 | Discharge: 2022-10-01 | Disposition: A | Discharge status: Home / Self Care | Payer: Medicare Other | Attending: Emergency Medicine | Admitting: Emergency Medicine

## 2022-10-01 ENCOUNTER — Encounter (HOSPITAL_BASED_OUTPATIENT_CLINIC_OR_DEPARTMENT_OTHER): Payer: Self-pay

## 2022-10-01 ENCOUNTER — Other Ambulatory Visit (HOSPITAL_BASED_OUTPATIENT_CLINIC_OR_DEPARTMENT_OTHER): Payer: Self-pay

## 2022-10-01 DIAGNOSIS — S0993XA Unspecified injury of face, initial encounter: Secondary | ICD-10-CM | POA: Diagnosis present

## 2022-10-01 DIAGNOSIS — W19XXXA Unspecified fall, initial encounter: Secondary | ICD-10-CM

## 2022-10-01 DIAGNOSIS — M25511 Pain in right shoulder: Secondary | ICD-10-CM | POA: Insufficient documentation

## 2022-10-01 DIAGNOSIS — W108XXA Fall (on) (from) other stairs and steps, initial encounter: Secondary | ICD-10-CM | POA: Insufficient documentation

## 2022-10-01 DIAGNOSIS — S0083XA Contusion of other part of head, initial encounter: Secondary | ICD-10-CM

## 2022-10-01 DIAGNOSIS — Z7902 Long term (current) use of antithrombotics/antiplatelets: Secondary | ICD-10-CM | POA: Diagnosis not present

## 2022-10-01 DIAGNOSIS — S20212A Contusion of left front wall of thorax, initial encounter: Secondary | ICD-10-CM | POA: Insufficient documentation

## 2022-10-01 DIAGNOSIS — S8012XA Contusion of left lower leg, initial encounter: Secondary | ICD-10-CM | POA: Insufficient documentation

## 2022-10-01 MED ORDER — OXYCODONE-ACETAMINOPHEN 5-325 MG PO TABS
2.0000 | ORAL_TABLET | Freq: Four times a day (QID) | ORAL | Status: DC | PRN
  Administered 2022-10-01: 2 via ORAL
  Filled 2022-10-01: qty 2

## 2022-10-01 MED ORDER — HYDROCODONE-ACETAMINOPHEN 10-325 MG PO TABS
1.0000 | ORAL_TABLET | Freq: Four times a day (QID) | ORAL | 0 refills | Status: DC | PRN
  Filled 2022-10-01: qty 12, 3d supply, fill #0

## 2022-10-01 NOTE — ED Triage Notes (Signed)
Patient here POV from Home.  Endorses Falling today approximately 15 Minutes ago when he tripped over a ladder and fell down 4-6 steps. Pain to Left Knee, Left Tib/Fib, Right Shoulder, Left Rib Cage, and Head/Face.   Positive LOC. Takes Plavix.   NAD Noted during Triage. A&Ox4. GCS 15. BIB Wheelchair.

## 2022-10-01 NOTE — Telephone Encounter (Signed)
scheduled

## 2022-10-01 NOTE — ED Provider Notes (Signed)
[place] EMERGENCY DEPARTMENT AT [facility] Provider Note   CSN: [id number] Arrival date & time: 10/01/22  1932     History  Chief Complaint  Patient presents with   Johnny [name] is a 72 y.o. male presenting from home with a mechanical fall, tripping over a ladder, twisting his left leg inside the ladder, and then falling down about 4-6 steps.  He is here in the signs at bedside.  Patient is reporting pain on the left side of his chest, also contusion and injury to his forehead.  He is on Plavix.  He also is a hematoma and swelling of the left mid tibia, and pain with ambulation.  He reports pain across his back.  HPI     Home Medications Prior to Admission medications   Medication Sig Start Date End Date Taking? Authorizing Provider  HYDROcodone-acetaminophen (NORCO) 10-325 MG tablet Take 1 tablet by mouth every 6 (six) hours as needed for up to 12 doses. 10/01/22  Yes Terald Sleeper, MD  butalbital-acetaminophen-caffeine (FIORICET) [phone] MG tablet Take 1 tablet by mouth every 6 (six) hours as needed for headache. 07/31/22   Glean Salvo, NP  clopidogrel (PLAVIX) 75 MG tablet Take 1 tablet (75 mg total) by mouth daily. 01/19/22   Wendall Stade, MD  HYDROcodone-acetaminophen (NORCO) 10-325 MG tablet Take by mouth as needed for pain. 10/31/21   [provider]  HYDROcodone-acetaminophen (NORCO) 10-325 MG tablet Take 1 tablet by mouth every 4 (four) hours as needed for pain. 02/22/22     HYDROcodone-acetaminophen (NORCO) 10-325 MG tablet Take 1 tablet by mouth every 4 (four) hours as needed for pain. 04/18/22     HYDROcodone-acetaminophen (NORCO) 10-325 MG tablet Take 1 tablet by mouth every 4 (four) hours as needed for pain. 07/11/22     HYDROcodone-acetaminophen (NORCO) 10-325 MG tablet Take 1 tablet by mouth every 4 (four) hours as needed for pain. 08/10/22     nebivolol (BYSTOLIC) 2.5 MG tablet TAKE 1 TABLET BY MOUTH EVERY DAY 07/30/22   Wendall Stade, MD  nitroGLYCERIN (NITROSTAT) 0.4 MG SL tablet Place 1 tablet (0.4 mg total) under the tongue every 5 (five) minutes as needed for chest pain. 11/03/21   Tereso Newcomer T, PA-C  ranolazine (RANEXA) 500 MG 12 hr tablet TAKE 1 TABLET BY MOUTH TWICE A DAY 08/03/22   Wendall Stade, MD  rosuvastatin (CRESTOR) 5 MG tablet Take 1 tablet (5 mg total) by mouth daily. 08/29/21 11/27/21  Wendall Stade, MD  sucralfate (CARAFATE) 1 g tablet Take 1 g by mouth 2 (two) times daily as needed. [place] 03/07/22   [provider]  traZODone (DESYREL) 50 MG tablet Take 50 mg by mouth 3 times/day as needed-between meals & bedtime for sleep.    [provider]  vitamin B-12 (CYANOCOBALAMIN) 500 MCG tablet Take 1 tablet (500 mcg total) by mouth daily. 09/07/21   Elgergawy, Leana Roe, MD      Allergies    Lipitor [atorvastatin], Praluent [alirocumab], Crestor [rosuvastatin], Imdur [isosorbide nitrate], Hydrocodone, Levaquin [levofloxacin], Metadate cd [methylphenidate hcl], Morphine and related, and Pravachol [pravastatin]    Review of Systems   Review of Systems  Physical Exam Updated Vital Signs BP (!) 143/94   Pulse 61   Temp 98.7 F (37.1 C) (Oral)   Resp 18   Ht 6' (1.829 m)   Wt 82.6 kg   SpO2 100%   BMI 24.68 kg/m  Physical Exam  Constitutional:      General: He is not in acute distress. HENT:     Head: Normocephalic.      Comments: Superficial abrasion and small contusion of the mid forehead Eyes:     Conjunctiva/sclera: Conjunctivae normal.     Pupils: Pupils are equal, round, and reactive to light.  Cardiovascular:     Rate and Rhythm: Normal rate and regular rhythm.  Pulmonary:     Effort: Pulmonary effort is normal. No respiratory distress.  Abdominal:     General: There is no distension.     Tenderness: There is no abdominal tenderness. There is no guarding.  Musculoskeletal:     Comments: Cervical, thoracic, and lumbar midline tenderness and also paraspinal  tenderness.  No palpable step-offs. No pelvic pain instability or limited range of motion of the hips.  Right lower extremity and bilateral upper extremities are unremarkable.  Left lower extremity with some mild tenderness to left patella, large hematoma overlying the mid left tibia, some mild tenderness along the left lateral malleoli Left lower anterior chest rib line tenderness, no flail chest, lower sternal tenderness Limited range of motion including abduction overhead arm raise with the right arm  Skin:    General: Skin is warm and dry.  Neurological:     General: No focal deficit present.     Mental Status: He is alert. Mental status is at baseline.  Psychiatric:        Mood and Affect: Mood normal.        Behavior: Behavior normal.     ED Results / Procedures / Treatments   Labs (all labs ordered are listed, but only abnormal results are displayed) Labs Reviewed - No data to display  EKG None  Radiology DG Humerus Right  Result Date: 10/01/2022 CLINICAL DATA:  Fall.  Humeral head tenderness to palpation. EXAM: RIGHT SHOULDER - 1 VIEW; RIGHT HUMERUS - 2+ VIEW COMPARISON:  Right shoulder radiographs 01/19/2004 FINDINGS: There is diffuse decreased bone mineralization. Right shoulder: Mild glenohumeral joint space narrowing, chronic cortical irregularity, and inferior glenoid and humeral head-neck junction degenerative osteophytosis. Mild-to-moderate acromioclavicular joint space narrowing and peripheral osteophytosis. No acute fracture or dislocation. Right humerus: No acute fracture. Mild chronic enthesopathic change is seen within the distal humerus on oblique view, likely the lateral epicondyle at the common extensor tendon origin. IMPRESSION: 1. No acute fracture. 2. 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 Electronically Signed   By: Neita Garnet M.D.   On: 10/01/2022 23:16   DG Shoulder Right Portable  Result Date: 10/01/2022 CLINICAL DATA:   Fall.  Humeral head tenderness to palpation. EXAM: RIGHT SHOULDER - 1 VIEW; RIGHT HUMERUS - 2+ VIEW COMPARISON:  Right shoulder radiographs 01/19/2004 FINDINGS: There is diffuse decreased bone mineralization. Right shoulder: Mild glenohumeral joint space narrowing, chronic cortical irregularity, and inferior glenoid and humeral head-neck junction degenerative osteophytosis. Mild-to-moderate acromioclavicular joint space narrowing and peripheral osteophytosis. No acute fracture or dislocation. Right humerus: No acute fracture. Mild chronic enthesopathic change is seen within the distal humerus on oblique view, likely the lateral epicondyle at the common extensor tendon origin. IMPRESSION: 1. No acute fracture. 2. 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 Electronically Signed   By: Neita Garnet M.D.   On: 10/01/2022 23:16   CT T-SPINE NO CHARGE  Result Date: 10/01/2022 CLINICAL DATA:  Patient tripped over a ladder and fell down 4-6 steps. Back pain. EXAM: CT THORACIC AND LUMBAR SPINE WITHOUT CONTRAST TECHNIQUE: Multidetector CT imaging of the thoracic and  lumbar spine was performed without intravenous contrast. Multiplanar CT image reconstructions were also generated. RADIATION DOSE REDUCTION: This exam was performed according to the departmental dose-optimization program which includes automated exposure control, adjustment of the mA and/or kV according to patient size and/or use of iterative reconstruction technique. COMPARISON:  None Available. FINDINGS: CT CERVICAL SPINE FINDINGS Alignment: . Skull base and vertebrae: No acute fracture. No primary bone lesion or focal pathologic process. Soft tissues and spinal canal: No prevertebral fluid or swelling. No visible canal hematoma. Disc levels:  . Upper chest: Emphysematous changes of the lung apices. No pneumothorax. CT THORACIC SPINE FINDINGS Alignment: Mild upper thoracic kyphosis with a wedge-shaped compression deformity of T3  vertebral body, likely chronic process. Mild levoscoliosis centered at T9-T10 disc space. Vertebrae: No acute fracture or focal pathologic process. Paraspinal and other soft tissues: Emphysematous changes of the lung parenchyma. No pneumothorax or acute pulmonary process. Disc levels: Mild multilevel degenerate disc disease with disc height loss. No significant disc bulge, spinal canal or neural foraminal stenosis CT LUMBAR SPINE FINDINGS Segmentation: 5 lumbar type vertebrae. Alignment: Straightening of the lumbar spine. Vertebrae: Posterior spinal fusion at at L4-L5. No acute fracture or focal pathologic process. Paraspinal and other soft tissues: No acute abnormality. Aortic atherosclerotic calcifications. Sigmoid colonic diverticulosis. Disc spaces: Multilevel degenerate disc disease with disc height loss and marginal osteophytes. T12-L1: No significant disc bulge, spinal canal or neural foraminal stenosis. L1-L2: No significant disc bulge, spinal canal or neural foraminal stenosis. Mild facet joint arthropathy. L2-L3: Disc height loss and circumferential disc bulge with lateral recess stenosis bilaterally. No significant neural foraminal stenosis. L3-L4: Circumferential disc bulge with moderate bilateral recess stenosis. Ligamentum flavum hypertrophy with spinal canal stenosis. Moderate bilateral facet joint arthropathy. Mild bilateral neural foraminal stenosis. L4-L5: Postsurgical changes without significant spinal canal or neural foraminal stenosis. L5-S1: Disc height loss with disc osteophyte complex and moderate bilateral lateral recess stenosis. Moderate-to-severe bilateral facet joint arthropathy. Spinal canal stenosis. No significant neural foraminal stenosis. IMPRESSION: CT thoracic spine: 1. No acute fracture or traumatic subluxation. 2. Mild levoscoliosis centered at T9-T10 disc space and mild upper thoracic kyphosis. CT lumbar spine 1. No acute fracture or traumatic subluxation. 2. Posterior spinal  fusion at L4-L5. 3. 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 Electronically Signed   By: Larose Hires D.O.   On: 10/01/2022 21:35   CT Lumbar Spine Wo Contrast  Result Date: 10/01/2022 CLINICAL DATA:  Patient tripped over a ladder and fell down 4-6 steps. Back pain. EXAM: CT THORACIC AND LUMBAR SPINE WITHOUT CONTRAST TECHNIQUE: Multidetector CT imaging of the thoracic and lumbar spine was performed without intravenous contrast. Multiplanar CT image reconstructions were also generated. RADIATION DOSE REDUCTION: This exam was performed according to the departmental dose-optimization program which includes automated exposure control, adjustment of the mA and/or kV according to patient size and/or use of iterative reconstruction technique. COMPARISON:  None Available. FINDINGS: CT CERVICAL SPINE FINDINGS Alignment: . Skull base and vertebrae: No acute fracture. No primary bone lesion or focal pathologic process. Soft tissues and spinal canal: No prevertebral fluid or swelling. No visible canal hematoma. Disc levels:  . Upper chest: Emphysematous changes of the lung apices. No pneumothorax. CT THORACIC SPINE FINDINGS Alignment: Mild upper thoracic kyphosis with a wedge-shaped compression deformity of T3 vertebral body, likely chronic process. Mild levoscoliosis centered at T9-T10 disc space. Vertebrae: No acute fracture or focal pathologic process. Paraspinal and other soft tissues:  Emphysematous changes of the lung parenchyma. No pneumothorax or acute pulmonary process. Disc levels: Mild multilevel degenerate disc disease with disc height loss. No significant disc bulge, spinal canal or neural foraminal stenosis CT LUMBAR SPINE FINDINGS Segmentation: 5 lumbar type vertebrae. Alignment: Straightening of the lumbar spine. Vertebrae: Posterior spinal fusion at at L4-L5. No acute fracture or focal pathologic process. Paraspinal and  other soft tissues: No acute abnormality. Aortic atherosclerotic calcifications. Sigmoid colonic diverticulosis. Disc spaces: Multilevel degenerate disc disease with disc height loss and marginal osteophytes. T12-L1: No significant disc bulge, spinal canal or neural foraminal stenosis. L1-L2: No significant disc bulge, spinal canal or neural foraminal stenosis. Mild facet joint arthropathy. L2-L3: Disc height loss and circumferential disc bulge with lateral recess stenosis bilaterally. No significant neural foraminal stenosis. L3-L4: Circumferential disc bulge with moderate bilateral recess stenosis. Ligamentum flavum hypertrophy with spinal canal stenosis. Moderate bilateral facet joint arthropathy. Mild bilateral neural foraminal stenosis. L4-L5: Postsurgical changes without significant spinal canal or neural foraminal stenosis. L5-S1: Disc height loss with disc osteophyte complex and moderate bilateral lateral recess stenosis. Moderate-to-severe bilateral facet joint arthropathy. Spinal canal stenosis. No significant neural foraminal stenosis. IMPRESSION: CT thoracic spine: 1. No acute fracture or traumatic subluxation. 2. Mild levoscoliosis centered at T9-T10 disc space and mild upper thoracic kyphosis. CT lumbar spine 1. No acute fracture or traumatic subluxation. 2. Posterior spinal fusion at L4-L5. 3. 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 Electronically Signed   By: Larose Hires D.O.   On: 10/01/2022 21:35   CT Chest Wo Contrast  Result Date: 10/01/2022 CLINICAL DATA:  Chest trauma EXAM: CT CHEST WITHOUT CONTRAST TECHNIQUE: Multidetector CT imaging of the chest was performed following the standard protocol without IV contrast. RADIATION DOSE REDUCTION: This exam was performed according to the departmental dose-optimization program which includes automated exposure control, adjustment of the mA and/or kV according to  patient size and/or use of iterative reconstruction technique. COMPARISON:  Chest CT 08/02/2020.  CT abdomen and pelvis 12/30/2015. FINDINGS: Cardiovascular: No significant vascular findings. Normal heart size. No pericardial effusion. There are atherosclerotic calcifications of the aorta and coronary arteries. Mediastinum/Nodes: No enlarged mediastinal or axillary lymph nodes. Thyroid gland, trachea, and esophagus demonstrate no significant findings. Lungs/Pleura: Mild emphysematous changes are seen in the upper lungs. There are linear areas of scarring or atelectasis in both lung bases, right greater than left. There is no pleural effusion or pneumothorax. Calcified granulomas are seen throughout both lungs. Upper Abdomen: There are nonobstructing right renal calculi measuring up to 5 mm. There is a 1.9 cm cyst in the left kidney with thin peripheral calcification. Musculoskeletal: No chest wall mass or suspicious bone lesions identified. IMPRESSION: 1. No acute cardiopulmonary process. 2. Mild emphysema. 3. Old granulomatous disease. 4. Nonobstructing right renal calculi. 5. Left renal cyst.  No follow-up imaging recommended. Aortic Atherosclerosis (ICD10-I70.0) and Emphysema (ICD10-J43.9). Electronically Signed   By: Darliss Cheney M.D.   On: 10/01/2022 21:05   DG Tibia/Fibula Left  Result Date: 10/01/2022 CLINICAL DATA:  Status post fall. EXAM: LEFT TIBIA AND FIBULA - 2 VIEW COMPARISON:  None Available. FINDINGS: There is no evidence of fracture or other focal bone lesions. Soft tissues are unremarkable. IMPRESSION: Negative. Electronically Signed   By: Aram Candela M.D.   On: 10/01/2022 20:57   DG Knee Complete 4 Views Left  Result Date: 10/01/2022 CLINICAL DATA:  Status post fall. EXAM: LEFT KNEE - COMPLETE 4+ VIEW  COMPARISON:  None Available. FINDINGS: No evidence of an acute fracture, dislocation, or joint effusion. A chronic versus congenital deformity is seen along the posterior aspect of the  lower left patella. Mild patellofemoral, medial tibiofemoral and lateral tibiofemoral compartment space narrowing is seen. Soft tissues are unremarkable. IMPRESSION: Mild tricompartmental degenerative changes. Electronically Signed   By: Aram Candela M.D.   On: 10/01/2022 20:57   DG Ankle Complete Left  Result Date: 10/01/2022 CLINICAL DATA:  Twisting. Fall injury. Tripped over a ladder and fell down 4-6 steps. EXAM: LEFT ANKLE COMPLETE - 3+ VIEW COMPARISON:  None Available. FINDINGS: There is diffuse decreased bone mineralization. The ankle mortise is symmetric and intact. Minimal dorsal talonavicular degenerative spurring. No acute fracture is seen. No dislocation. IMPRESSION: No acute fracture. Electronically Signed   By: Neita Garnet M.D.   On: 10/01/2022 20:57   CT Cervical Spine Wo Contrast  Result Date: 10/01/2022 CLINICAL DATA:  Polytrauma, blunt.  Fall. EXAM: CT CERVICAL SPINE WITHOUT CONTRAST TECHNIQUE: Multidetector CT imaging of the cervical spine was performed without intravenous contrast. Multiplanar CT image reconstructions were also generated. RADIATION DOSE REDUCTION: This exam was performed according to the departmental dose-optimization program which includes automated exposure control, adjustment of the mA and/or kV according to patient size and/or use of iterative reconstruction technique. COMPARISON:  None Available. FINDINGS: Alignment: Normal Skull base and vertebrae: No acute fracture. No primary bone lesion or focal pathologic process. Soft tissues and spinal canal: No prevertebral fluid or swelling. No visible canal hematoma. Disc levels: Diffuse degenerative disc disease with disc space narrowing and spurring. No disc herniation. Mild to moderate degenerative facet disease, left greater than right. Upper chest: No acute findings. Other: None IMPRESSION: Cervical spondylosis.  No acute bony abnormality. Electronically Signed   By: Charlett Nose M.D.   On: 10/01/2022 20:49    CT Head Wo Contrast  Result Date: 10/01/2022 CLINICAL DATA:  Head trauma, moderate-severe forehead injury, on plavix. Fall. EXAM: CT HEAD WITHOUT CONTRAST TECHNIQUE: Contiguous axial images were obtained from the base of the skull through the vertex without intravenous contrast. RADIATION DOSE REDUCTION: This exam was performed according to the departmental dose-optimization program which includes automated exposure control, adjustment of the mA and/or kV according to patient size and/or use of iterative reconstruction technique. COMPARISON:  09/06/2021 FINDINGS: Brain: There is atrophy and chronic small vessel disease changes. No acute intracranial abnormality. Specifically, no hemorrhage, hydrocephalus, mass lesion, acute infarction, or significant intracranial injury. Vascular: No hyperdense vessel or unexpected calcification. Skull: No acute calvarial abnormality. Sinuses/Orbits: No acute findings Other: None IMPRESSION: Atrophy, chronic microvascular disease. No acute intracranial abnormality. Electronically Signed   By: Charlett Nose M.D.   On: 10/01/2022 20:47    Procedures Procedures    Medications Ordered in ED Medications  oxyCODONE-acetaminophen (PERCOCET/ROXICET) 5-325 MG per tablet 2 tablet (2 tablets Oral Given 10/01/22 2019)    ED Course/ Medical Decision Making/ A&P                             Medical Decision Making Amount and/or Complexity of Data Reviewed Radiology: ordered.  Risk Prescription drug management.   Patient is here with a mechanical fall today, several traumatic injuries noted on exam.  CT scans and x-rays were ordered and personally reviewed and interpreted, showing no emergent findings or acute fracture.  Supplemental history is provided by the patient's children at bedside whom he lives with.  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  Will place him in a shoulder sling at this time and have him follow-up  with the orthopedic doctor.  He is steady on his feet in the ED.  He also is in chronic pain management at a pain clinic.  They are requesting a temporary or brief refill on their pain medication, until they can see their doctor.  I did provide a few tablets of the Norco 10 mg that he takes at home.        Final Clinical Impression(s) / ED Diagnoses Final diagnoses:  Fall, initial encounter  Contusion of forehead, initial encounter  Right shoulder pain, unspecified chronicity  Contusion of left lower extremity, initial encounter  Contusion of left chest wall, initial encounter    Rx / DC Orders ED Discharge Orders          Ordered    HYDROcodone-acetaminophen (NORCO) 10-325 MG tablet  Every 6 hours PRN        10/01/22 2316              Terald Sleeper, MD 10/01/22 2332

## 2022-10-01 NOTE — Telephone Encounter (Signed)
Sheryl will CB to reschedule when she gets home

## 2022-10-01 NOTE — Discharge Instructions (Addendum)
Please follow-up with your orthopedic doctor about your right shoulder pain.  This may be an injury to your rotator cuff.  I would recommend that you use the arm sling in the daytime until then, to avoid further injuring your arm.  I would also recommend you follow-up with your pain clinic for your pain.  You have several contusions or bruises on her body.  These will likely be sore and swollen for several days.  You can apply ice as needed to your ribs into your lower leg.  Do not bind your chest.

## 2022-10-01 NOTE — ED Notes (Signed)
Wounds cleansed with sterile water. Antibiotic ointment applied.

## 2022-10-02 ENCOUNTER — Other Ambulatory Visit: Payer: Self-pay

## 2022-10-02 ENCOUNTER — Other Ambulatory Visit (HOSPITAL_BASED_OUTPATIENT_CLINIC_OR_DEPARTMENT_OTHER): Payer: Self-pay

## 2022-10-02 NOTE — ED Notes (Signed)
Pt verbalized understanding of d/c instructions, meds, and followup care. Denies questions. VSS, no distress noted. Steady gait to exit with all belongings.  ?

## 2022-10-03 ENCOUNTER — Other Ambulatory Visit (HOSPITAL_BASED_OUTPATIENT_CLINIC_OR_DEPARTMENT_OTHER): Payer: Self-pay

## 2022-10-03 MED ORDER — HYDROCODONE-ACETAMINOPHEN 10-325 MG PO TABS
1.0000 | ORAL_TABLET | ORAL | 0 refills | Status: DC | PRN
  Filled 2022-10-05: qty 180, 30d supply, fill #0

## 2022-10-03 MED ORDER — HYDROCODONE-ACETAMINOPHEN 10-325 MG PO TABS: 1.0000 | ORAL_TABLET | ORAL | 0 refills | Status: DC | PRN

## 2022-10-05 ENCOUNTER — Other Ambulatory Visit: Payer: Self-pay

## 2022-10-05 ENCOUNTER — Other Ambulatory Visit (HOSPITAL_BASED_OUTPATIENT_CLINIC_OR_DEPARTMENT_OTHER): Payer: Self-pay

## 2022-10-08 NOTE — Progress Notes (Signed)
Patient Care Team: Margaree Mackintosh, MD as PCP - General (Internal Medicine) Wendall Stade, MD as PCP - Cardiology (Cardiology) Collene Gobble, MD as Consulting Physician (Family Medicine) Micki Riley, MD as Consulting Physician (Neurology) Karie Soda, MD as Consulting Physician (General Surgery) Charlott Rakes, MD as Consulting Physician (Gastroenterology)  Visit Date: 10/23/22  Subjective:    Patient ID: Johnny [name] , Male   DOB: 11-22-1950, 72 y.o.    MRN: [id number]   72 y.o. Male presents today for a 3 month follow-up. He has a history of adenomatous polyps, bronchitis, coronary artery disease in native artery, chronic back pain, GERD, hyperlipidemia, hypertension, osteopenia, internal hemorrhoids, myocardial infarction, spondylosis, transient ischemic attack, ureterolithiasis, ventral hernia, Vitamin D deficiency.   He plans to get hearing aids soon for bilateral hearing loss.   Will be going to [facility] for adult ADD. Has taken Ritalin in the past. Did not tolerate Vyvanse.Ritalin may not be of help if patient is developing memory loss issues. I think he needs neuropsychological testing with Dr. Kieth Brightly and referral will be made.  Seen in ED on 10/01/22 for fall resulting in cervical, thoracic, lumbar midline tenderness and paraspinal tenderness, left patella tenderness, large hematoma overlying mid left tibia, left lateral malleoli tenderness, left lower anterior chest rib line tenderness, lower sternal tenderness, limited range of motion including abduction overhead arm raise with right arm. CT scans and X-rays showed no emergent findings or acute fracture. May have exacerbated chronic rotator cuff injury right arm. Given several tablets of Norco 10 mg.  Chronic musculoskeletal pain treated at pain clinic with Norco 10-325 every 4 hours as needed.  History of chronic angina, treated with Ranexa 500 mg twice daily, nitroglycerin 0.4 mg every 5  minutes as needed for chest pain.  Seen in ED on 10/02/21 for atypical chest pain. Chest X-ray showed no radiographic evidence of acute cardiopulmonary disease, areas of fibrosis or chronic post infectious or inflammatory scarring again noted throughout the lung bases bilaterally, similar to the prior study, emphysema, aortic atherosclerosis. EKG showed no acute ST/T changes. Treated conservatively with nitroglycerin. Discharged in stable condition.   Seen again on 10/26/21 for chest pain. Chest X-ray showed no active cardiopulmonary disease. EKG showed sinus bradycardia, possible anterior infarct. Treated conservatively with GI cocktail with some improvement. Stress test in ED showed low risk, some ST depressions during test. Chain pain improved upon discharge.  History of transient ischemic attack treated with Plavix 75 mg daily.    History of GERD treated with Carafate 1 g twice daily as needed. Stomach biopsy on 01/01/22 showed chronic inactive gastritis, no helicobacter pylori organisms seen on H and E stain  History of hyperlipidemia treated with rosuvastatin 2.5 mg daily. Has difficulty tolerating this. TRIG elevated at 258 on 10/18/22.  History of hypertension treated with nebivolol 2.5 mg daily. Blood pressure elevated today at 144/78.  History of osteopenia- no recent bone density study.  History of depression treated with Desyrel 50 mg 3 times daily as needed between meals and at bedtime.   Vitamin B12 deficiency treated with Vitamin B-12 500 mcg daily.   History of headaches treated with Fioricet 50-325-40 mg every 6 hours as needed.   Seen by neurologist regularly.  Denies swelling in feet.  Past Medical History:  Diagnosis Date   Adenomatous polyps    Bronchitis    CAD in native artery 2017   Chronic back pain    GERD (gastroesophageal reflux disease)  Hyperlipidemia    Hypertension    Internal hemorrhoids    Low testosterone    Myocardial infarction (HCC)     Osteopenia    Shortness of breath dyspnea    Spondylosis    TIA (transient ischemic attack) 2008   Ureterolithiasis    Ventral hernia    Vitamin D deficiency      Family History  Problem Relation Age of Onset   Heart disease Mother    Stroke Mother    Heart attack Mother    Emphysema Father     Social History   Social History Narrative   Lives at home with his wife.   Right-handed.   2 cups caffeine per day.      Social history: He is married.  He had a son involved in a serious motor vehicle accident which took over a year to recover.  His wife has multiple sclerosis and is disabled.  He is a retired Company secretary.  He does not smoke.  Very occasional alcohol consumption.  2 other adult children.          Review of Systems  Constitutional:  Negative for fever and malaise/fatigue.  HENT:  Negative for congestion.   Eyes:  Negative for blurred vision.  Respiratory:  Negative for cough and shortness of breath.   Cardiovascular:  Negative for chest pain, palpitations and leg swelling.  Gastrointestinal:  Negative for vomiting.  Musculoskeletal:  Negative for back pain.  Skin:  Negative for rash.  Neurological:  Negative for loss of consciousness and headaches.        Objective:   Vitals: BP (!) 144/78   Pulse (!) 57   Temp 98.7 F (37.1 C) (Tympanic)   Resp 16   Ht 6' (1.829 m)   Wt 185 lb (83.9 kg)   SpO2 97%   BMI 25.09 kg/m    Physical Exam Constitutional:      General: He is not in acute distress.    Appearance: Normal appearance. He is not ill-appearing.  HENT:     Head: Normocephalic and atraumatic.  Cardiovascular:     Rate and Rhythm: Normal rate and regular rhythm.     Pulses: Normal pulses.     Heart sounds: Normal heart sounds. No murmur heard.    No friction rub. No gallop.  Pulmonary:     Effort: Pulmonary effort is normal. No respiratory distress.     Breath sounds: Normal breath sounds. No wheezing or rales.  Musculoskeletal:     Right  lower leg: No edema.     Left lower leg: No edema.  Skin:    General: Skin is warm and dry.  Neurological:     Mental Status: He is alert. Mental status is at baseline. He is disoriented.  Psychiatric:        Mood and Affect: Mood normal.        Behavior: Behavior normal.        Thought Content: Thought content normal.        Judgment: Judgment normal.       Results:   Studies obtained and personally reviewed by me:   Labs:       Component Value Date/Time   NA 139 07/09/2022 0909   NA 135 07/26/2020 1207   K 4.6 07/09/2022 0909   CL 105 07/09/2022 0909   CO2 26 07/09/2022 0909   GLUCOSE 84 07/09/2022 0909   BUN 14 07/09/2022 0909   BUN 13 07/26/2020 1207  CREATININE 1.12 07/09/2022 0909   CALCIUM 10.0 07/09/2022 0909   PROT 6.9 07/09/2022 0909   PROT 6.4 01/19/2020 0808   ALBUMIN 4.5 04/10/2022 1059   ALBUMIN 4.5 01/19/2020 0808   AST 13 07/09/2022 0909   ALT 11 07/09/2022 0909   ALKPHOS 31 (L) 10/26/2021 0615   BILITOT 0.7 07/09/2022 0909   BILITOT 0.5 01/19/2020 0808   GFRNONAA >60 10/26/2021 0615   GFRNONAA 75 06/14/2020 0911   GFRAA 87 06/14/2020 0911     Lab Results  Component Value Date   WBC 6.3 07/09/2022   HGB 14.4 07/09/2022   HCT 41.3 07/09/2022   MCV 97.9 07/09/2022   PLT 204 07/09/2022    Lab Results  Component Value Date   CHOL 177 10/18/2022   HDL 42 10/18/2022   LDLCALC 98 10/18/2022   TRIG 258 (H) 10/18/2022   CHOLHDL 4.2 10/18/2022    Lab Results  Component Value Date   HGBA1C 5.1 07/09/2022     Lab Results  Component Value Date   TSH 0.418 09/07/2021     Lab Results  Component Value Date   PSA 0.63 07/09/2022   PSA 0.69 07/07/2021   PSA 0.57 06/14/2020   Assessment & Plan:   Hearing loss: plans to get hearing aids soon for bilateral hearing loss.   Adult ADD: Has taken Ritalin in the past. Did not tolerate Vyvanse.  Wife wants him to see [facility] again and thinks he needs ADD  medication.  Memory loss: having difficulty with recall today. Referral for Neuropsychologist, Dr. Robley Fries.  Musculoskeletal pain: treated at pain clinic with Norco 10-325 every 4 hours as needed.  Chronic angina: treated with Ranexa 500 mg twice daily, nitroglycerin 0.4 mg every 5 minutes as needed for chest pain.  No complaint of chest pain today.  Hx of transient ischemic attack: treated with Plavix 75 mg daily.   GERD: treated with Carafate 1 g twice daily as needed.  Hyperlipidemia: treated with rosuvastatin 2.5 mg daily. Has difficulty tolerating this. TRIG elevated at 258 on 10/18/22.  Hypertension: treated with nebivolol 2.5 mg daily. Blood pressure elevated today at 144/78.  Osteopenia: no recent bone density study. Have a repeat study   Depression: treated with Desyrel 50 mg 3 times daily as needed between meals and at bedtime.   Vitamin B12 deficiency: treated with Vitamin B-12 500 mcg daily.   Headaches: treated with Fioricet 50-325-40 mg every 6 hours as needed.   Return in 06/2023 for physical.  Stress with family issues.  I,Alexander Ruley,acting as a Neurosurgeon for Margaree Mackintosh, MD.,have documented all relevant documentation on the behalf of Margaree Mackintosh, MD,as directed by  Margaree Mackintosh, MD while in the presence of Margaree Mackintosh, MD.   I, Margaree Mackintosh, MD, have reviewed all documentation for this visit. The documentation on 10/24/22 for the exam, diagnosis, procedures, and orders are all accurate and complete.

## 2022-10-10 ENCOUNTER — Other Ambulatory Visit: Payer: Self-pay | Admitting: Cardiovascular Disease

## 2022-10-18 ENCOUNTER — Other Ambulatory Visit: Payer: Medicare Other

## 2022-10-18 DIAGNOSIS — E782 Mixed hyperlipidemia: Secondary | ICD-10-CM

## 2022-10-19 ENCOUNTER — Ambulatory Visit: Payer: Medicare Other | Admitting: Internal Medicine

## 2022-10-19 LAB — LIPID PANEL
Cholesterol: 177 mg/dL (ref <200)
HDL: 42 mg/dL (ref >=40)
LDL Cholesterol (Calc): 98 mg/dL (calc)
Non-HDL Cholesterol (Calc): 135 mg/dL (calc) — ABNORMAL HIGH (ref <130)
Total CHOL/HDL Ratio: 4.2 (calc) (ref <5.0)
Triglycerides: 258 mg/dL — ABNORMAL HIGH (ref <150)

## 2022-10-23 ENCOUNTER — Ambulatory Visit: Payer: Medicare Other | Admitting: Internal Medicine

## 2022-10-23 ENCOUNTER — Encounter: Payer: Self-pay | Admitting: Internal Medicine

## 2022-10-23 VITALS — BP 144/78 | HR 57 | Temp 98.7°F | Resp 16 | Ht 72.0 in | Wt 185.0 lb

## 2022-10-23 DIAGNOSIS — F439 Reaction to severe stress, unspecified: Secondary | ICD-10-CM

## 2022-10-23 DIAGNOSIS — R413 Other amnesia: Secondary | ICD-10-CM

## 2022-10-23 DIAGNOSIS — I25118 Atherosclerotic heart disease of native coronary artery with other forms of angina pectoris: Secondary | ICD-10-CM | POA: Diagnosis not present

## 2022-10-23 DIAGNOSIS — I252 Old myocardial infarction: Secondary | ICD-10-CM | POA: Diagnosis not present

## 2022-10-23 DIAGNOSIS — M8580 Other specified disorders of bone density and structure, unspecified site: Secondary | ICD-10-CM

## 2022-10-23 DIAGNOSIS — K2100 Gastro-esophageal reflux disease with esophagitis, without bleeding: Secondary | ICD-10-CM

## 2022-10-23 DIAGNOSIS — J439 Emphysema, unspecified: Secondary | ICD-10-CM

## 2022-10-23 DIAGNOSIS — I10 Essential (primary) hypertension: Secondary | ICD-10-CM

## 2022-10-23 DIAGNOSIS — E782 Mixed hyperlipidemia: Secondary | ICD-10-CM

## 2022-10-23 MED ORDER — CLOPIDOGREL BISULFATE 75 MG PO TABS: 75.0000 mg | ORAL_TABLET | Freq: Every day | ORAL | 1 refills | Status: DC

## 2022-10-24 NOTE — Patient Instructions (Signed)
Referral to Dr. Kieth Brightly for Neuropsychological testing with memory loss being an issue.  Bone density study ordered with history of osteopenia.  Wife says he is going back to [facility] regarding attention deficit.  He seems to have some mild memory loss.  I think he would benefit from the Neuropsychological testing.  Continues with Desyrel for depression and stress.  Continues on Plavix for history of TIA.  No complaint of chest pain.

## 2022-10-31 ENCOUNTER — Encounter: Payer: Self-pay | Admitting: Psychology

## 2022-11-15 ENCOUNTER — Ambulatory Visit: Payer: Medicare Other | Admitting: Neurology

## 2022-12-17 ENCOUNTER — Other Ambulatory Visit: Payer: Self-pay

## 2022-12-17 ENCOUNTER — Emergency Department (HOSPITAL_COMMUNITY): Payer: Medicare Other

## 2022-12-17 ENCOUNTER — Encounter (HOSPITAL_BASED_OUTPATIENT_CLINIC_OR_DEPARTMENT_OTHER): Payer: Self-pay | Admitting: Emergency Medicine

## 2022-12-17 ENCOUNTER — Emergency Department (HOSPITAL_BASED_OUTPATIENT_CLINIC_OR_DEPARTMENT_OTHER): Payer: Medicare Other

## 2022-12-17 ENCOUNTER — Emergency Department (HOSPITAL_BASED_OUTPATIENT_CLINIC_OR_DEPARTMENT_OTHER)
Admission: EM | Admit: 2022-12-17 | Discharge: 2022-12-18 | Disposition: A | Discharge status: Home / Self Care | Payer: Medicare Other | Attending: Emergency Medicine | Admitting: Emergency Medicine

## 2022-12-17 DIAGNOSIS — I6782 Cerebral ischemia: Secondary | ICD-10-CM | POA: Diagnosis not present

## 2022-12-17 DIAGNOSIS — I10 Essential (primary) hypertension: Secondary | ICD-10-CM | POA: Insufficient documentation

## 2022-12-17 DIAGNOSIS — F03A4 Unspecified dementia, mild, with anxiety: Secondary | ICD-10-CM | POA: Diagnosis present

## 2022-12-17 DIAGNOSIS — I2510 Atherosclerotic heart disease of native coronary artery without angina pectoris: Secondary | ICD-10-CM | POA: Insufficient documentation

## 2022-12-17 DIAGNOSIS — R441 Visual hallucinations: Secondary | ICD-10-CM | POA: Diagnosis not present

## 2022-12-17 DIAGNOSIS — R4689 Other symptoms and signs involving appearance and behavior: Secondary | ICD-10-CM

## 2022-12-17 LAB — COMPREHENSIVE METABOLIC PANEL
ALT: 11 U/L (ref 0–44)
AST: 12 U/L — ABNORMAL LOW (ref 15–41)
Albumin: 4 g/dL (ref 3.5–5.0)
Alkaline Phosphatase: 42 U/L (ref 38–126)
Anion gap: 7 (ref 5–15)
BUN: 17 mg/dL (ref 8–23)
CO2: 27 mmol/L (ref 22–32)
Calcium: 9.5 mg/dL (ref 8.9–10.3)
Chloride: 106 mmol/L (ref 98–111)
Creatinine, Ser: 1.12 mg/dL (ref 0.61–1.24)
GFR, Estimated: >60 mL/min (ref >60)
Glucose, Bld: 84 mg/dL (ref 70–99)
Potassium: 3.8 mmol/L (ref 3.5–5.1)
Sodium: 140 mmol/L (ref 135–145)
Total Bilirubin: 0.7 mg/dL (ref 0.3–1.2)
Total Protein: 6.4 g/dL — ABNORMAL LOW (ref 6.5–8.1)

## 2022-12-17 LAB — CBC WITH DIFFERENTIAL/PLATELET
Abs Immature Granulocytes: 0.01 10*3/uL (ref 0.00–0.07)
Basophils Absolute: 0.1 10*3/uL (ref 0.0–0.1)
Basophils Relative: 1 %
Eosinophils Absolute: 0.1 10*3/uL (ref 0.0–0.5)
Eosinophils Relative: 2 %
HCT: 37.4 % — ABNORMAL LOW (ref 39.0–52.0)
Hemoglobin: 12.6 g/dL — ABNORMAL LOW (ref 13.0–17.0)
Immature Granulocytes: 0 %
Lymphocytes Relative: 33 %
Lymphs Abs: 2.2 10*3/uL (ref 0.7–4.0)
MCH: 34.1 pg — ABNORMAL HIGH (ref 26.0–34.0)
MCHC: 33.7 g/dL (ref 30.0–36.0)
MCV: 101.4 fL — ABNORMAL HIGH (ref 80.0–100.0)
Monocytes Absolute: 0.7 10*3/uL (ref 0.1–1.0)
Monocytes Relative: 10 %
Neutro Abs: 3.8 10*3/uL (ref 1.7–7.7)
Neutrophils Relative %: 54 %
Platelets: 201 10*3/uL (ref 150–400)
RBC: 3.69 MIL/uL — ABNORMAL LOW (ref 4.22–5.81)
RDW: 11.9 % (ref 11.5–15.5)
WBC: 6.9 10*3/uL (ref 4.0–10.5)
nRBC: 0 % (ref 0.0–0.2)

## 2022-12-17 LAB — URINALYSIS, ROUTINE W REFLEX MICROSCOPIC
Bacteria, UA: NONE SEEN
Bilirubin Urine: NEGATIVE
Glucose, UA: NEGATIVE mg/dL
Ketones, ur: NEGATIVE mg/dL
Leukocytes,Ua: NEGATIVE
Nitrite: NEGATIVE
Protein, ur: NEGATIVE mg/dL
Specific Gravity, Urine: 1.024 (ref 1.005–1.030)
pH: 6 (ref 5.0–8.0)

## 2022-12-17 LAB — LIPASE, BLOOD: Lipase: 25 U/L (ref 11–51)

## 2022-12-17 MED ORDER — NEBIVOLOL HCL 2.5 MG PO TABS
2.5000 mg | ORAL_TABLET | Freq: Every day | ORAL | Status: DC
  Filled 2022-12-17: qty 1

## 2022-12-17 MED ORDER — RANOLAZINE ER 500 MG PO TB12
500.0000 mg | ORAL_TABLET | Freq: Two times a day (BID) | ORAL | Status: DC
  Filled 2022-12-17: qty 1

## 2022-12-17 MED ORDER — CLOPIDOGREL BISULFATE 75 MG PO TABS
75.0000 mg | ORAL_TABLET | Freq: Every day | ORAL | Status: DC
  Filled 2022-12-17: qty 1

## 2022-12-17 MED ORDER — TRAZODONE HCL 100 MG PO TABS: 50.0000 mg | ORAL_TABLET | Freq: Two times a day (BID) | ORAL | Status: DC | PRN

## 2022-12-17 MED ORDER — ROSUVASTATIN CALCIUM 5 MG PO TABS: 5.0000 mg | ORAL_TABLET | Freq: Every day | ORAL | Status: DC

## 2022-12-17 NOTE — BH Assessment (Addendum)
A TTS order has been placed for the patient. Beverly Milch, NP, from the [facility] team, has discussed the case with Dr. Virgina Norfolk. After reviewing the triage note, it was determined that a comprehensive neurological evaluation is needed to rule out any underlying medical issues. Chinwendu Onuoha, NP, noted that the patient is prescribed 180 Percocets per month, as recorded in the PDMP, which might impact his balance and depth perception if used continuously. It is therefore advised that the patient be referred to [facility] for medical clearance. Once medical clearance is obtained, the TTS consult can be rescheduled, along with the MRI and TOC, as social work placement may be required. Pricilla Loveless and Dr. Virgina Norfolk have reviewed and discussed this plan.

## 2022-12-17 NOTE — ED Provider Notes (Signed)
[name] EMERGENCY DEPARTMENT AT [facility] Provider Note   CSN: [id number] Arrival date & time: 12/17/22  1819     History  Chief Complaint  Patient presents with   Hallucinations    Johnny [name] is a 72 y.o. male.  Patient here with hallucinations for the last few weeks, paranoid thoughts.  Having some aggressive behavior episodes.  Family here as they do not feel safe with him at home.  There are young children in the house.  He has been having some balance issues.  He has been taking chronic pain meds, trazodone for sleep now the last few days with not much improvement.  Not sleeping not eating well.  He denies any suicidal homicidal ideation.  No weakness or numbness or speech changes or vision changes or obvious stroke symptoms otherwise.  This post to follow-up with neuropsychiatry seen.  Denies any fever or chills or cough or sputum production.  The history is provided by the patient.       Home Medications Prior to Admission medications   Medication Sig Start Date End Date Taking? Authorizing Provider  butalbital-acetaminophen-caffeine (FIORICET) 50-325-40 MG tablet Take 1 tablet by mouth every 6 (six) hours as needed for headache. 07/31/22   Glean Salvo, NP  clopidogrel (PLAVIX) 75 MG tablet Take 1 tablet (75 mg total) by mouth daily. 10/23/22   Margaree Mackintosh, MD  HYDROcodone-acetaminophen (NORCO) 10-325 MG tablet Take 1 tablet by mouth every 6 (six) hours as needed for up to 12 doses. 10/01/22   Terald Sleeper, MD  HYDROcodone-acetaminophen (NORCO) 10-325 MG tablet Take 1 tablet by mouth every 4 (four) hours as needed for pain. 10/05/22     HYDROcodone-acetaminophen (NORCO) 10-325 MG tablet Take 1 tablet by mouth every 4 hours as needed for pain. 11/02/22     nebivolol (BYSTOLIC) 2.5 MG tablet TAKE 1 TABLET BY MOUTH EVERY DAY 07/30/22   Wendall Stade, MD  nitroGLYCERIN (NITROSTAT) 0.4 MG SL tablet Place 1 tablet (0.4 mg total) under the tongue every 5  (five) minutes as needed for chest pain. 11/03/21   Tereso Newcomer T, PA-C  ranolazine (RANEXA) 500 MG 12 hr tablet TAKE 1 TABLET BY MOUTH TWICE A DAY 08/03/22   Wendall Stade, MD  rosuvastatin (CRESTOR) 5 MG tablet Take 1 tablet (5 mg total) by mouth daily. 08/29/21 11/27/21  Wendall Stade, MD  sucralfate (CARAFATE) 1 g tablet Take 1 g by mouth 2 (two) times daily as needed. [place] 03/07/22   [provider]  traZODone (DESYREL) 50 MG tablet Take 50 mg by mouth 3 times/day as needed-between meals & bedtime for sleep.    [provider]  vitamin B-12 (CYANOCOBALAMIN) 500 MCG tablet Take 1 tablet (500 mcg total) by mouth daily. 09/07/21   Elgergawy, Leana Roe, MD      Allergies    Lipitor [atorvastatin], Praluent [alirocumab], Crestor [rosuvastatin], Imdur [isosorbide nitrate], Hydrocodone, Levaquin [levofloxacin], Metadate cd [methylphenidate hcl], Morphine and codeine, and Pravachol [pravastatin]    Review of Systems   Review of Systems  Physical Exam Updated Vital Signs BP (!) 155/93   Pulse (!) 48   Temp 98.1 F (36.7 C) (Oral)   Resp 15   SpO2 100%  Physical Exam Vitals and nursing note reviewed.  Constitutional:      General: He is not in acute distress.    Appearance: He is well-developed. He is not ill-appearing.  HENT:     Head: Normocephalic and atraumatic.  Nose: Nose normal.     Mouth/Throat:     Mouth: Mucous membranes are moist.  Eyes:     Extraocular Movements: Extraocular movements intact.     Conjunctiva/sclera: Conjunctivae normal.     Pupils: Pupils are equal, round, and reactive to light.  Cardiovascular:     Rate and Rhythm: Normal rate and regular rhythm.     Pulses: Normal pulses.     Heart sounds: Normal heart sounds. No murmur heard. Pulmonary:     Effort: Pulmonary effort is normal. No respiratory distress.     Breath sounds: Normal breath sounds.  Abdominal:     Palpations: Abdomen is soft.     Tenderness: There is no  abdominal tenderness.  Musculoskeletal:        General: No swelling.     Cervical back: Normal range of motion and neck supple.  Skin:    General: Skin is warm and dry.     Capillary Refill: Capillary refill takes less than 2 seconds.  Neurological:     General: No focal deficit present.     Mental Status: He is alert and oriented to person, place, and time.     Cranial Nerves: No cranial nerve deficit.     Sensory: No sensory deficit.     Motor: No weakness.     Coordination: Coordination normal.     Comments: 5+ out of 5 strength throughout, normal sensation, no drift, normal speech, normal visual fields  Psychiatric:        Mood and Affect: Mood normal.     ED Results / Procedures / Treatments   Labs (all labs ordered are listed, but only abnormal results are displayed) Labs Reviewed  CBC WITH DIFFERENTIAL/PLATELET - Abnormal; Notable for the following components:      Result Value   RBC 3.69 (*)    Hemoglobin 12.6 (*)    HCT 37.4 (*)    MCV 101.4 (*)    MCH 34.1 (*)    All other components within normal limits  COMPREHENSIVE METABOLIC PANEL - Abnormal; Notable for the following components:   Total Protein 6.4 (*)    AST 12 (*)    All other components within normal limits  URINALYSIS, ROUTINE W REFLEX MICROSCOPIC - Abnormal; Notable for the following components:   Hgb urine dipstick TRACE (*)    All other components within normal limits  LIPASE, BLOOD    EKG EKG Interpretation Date/Time:  Monday December 17 2022 18:40:52 EDT Ventricular Rate:  58 PR Interval:  182 QRS Duration:  93 QT Interval:  396 QTC Calculation: 389 R Axis:   86  Text Interpretation: Sinus rhythm Confirmed by Virgina Norfolk [phone]) on 12/17/2022 7:10:19 PM  Radiology CT Head Wo Contrast  Result Date: 12/17/2022 CLINICAL DATA:  Altered mental status. EXAM: CT HEAD WITHOUT CONTRAST TECHNIQUE: Contiguous axial images were obtained from the base of the skull through the vertex without  intravenous contrast. RADIATION DOSE REDUCTION: This exam was performed according to the departmental dose-optimization program which includes automated exposure control, adjustment of the mA and/or kV according to patient size and/or use of iterative reconstruction technique. COMPARISON:  Oct 01, 2022 FINDINGS: Brain: There is mild cerebral atrophy with widening of the extra-axial spaces and ventricular dilatation. There are areas of decreased attenuation within the white matter tracts of the supratentorial brain, consistent with microvascular disease changes. Vascular: No hyperdense vessel or unexpected calcification. Skull: Normal. Negative for fracture or focal lesion. Sinuses/Orbits: No acute finding. Other: None.  IMPRESSION: 1. Generalized cerebral atrophy with chronic white matter small vessel ischemic changes. 2. No acute intracranial abnormality. Electronically Signed   By: Aram Candela M.D.   On: 12/17/2022 20:45   DG Chest Portable 1 View  Result Date: 12/17/2022 CLINICAL DATA:  Altered mental status. EXAM: PORTABLE CHEST 1 VIEW COMPARISON:  Chest radiograph dated 10/26/2021 and CT dated 10/01/2022. FINDINGS: Minimal right lung base atelectasis. No focal consolidation, pleural effusion, or pneumothorax. The cardiac silhouette is within normal limits. No acute osseous pathology. IMPRESSION: No active disease. Electronically Signed   By: Elgie Collard M.D.   On: 12/17/2022 20:27    Procedures Procedures    Medications Ordered in ED Medications  clopidogrel (PLAVIX) tablet 75 mg (has no administration in time range)  nebivolol (BYSTOLIC) tablet 2.5 mg (has no administration in time range)  ranolazine (RANEXA) 12 hr tablet 500 mg (has no administration in time range)  rosuvastatin (CRESTOR) tablet 5 mg (has no administration in time range)  traZODone (DESYREL) tablet 50 mg (has no administration in time range)    ED Course/ Medical Decision Making/ A&P                              Medical Decision Making Amount and/or Complexity of Data Reviewed Labs: ordered. Radiology: ordered.  Risk Prescription drug management.   Johnny [name] is here with hallucinations, aggressive behavior.  Unremarkable vitals.  No fever.  Sinus rhythm on EKG.  Neurologically appears to be intact.  He has memory loss issues and now dealing maybe with some neuropsychiatry issues.  They have an outpatient follow-up with this provider but not anytime soon.  He has been having ongoing hallucinations last few weeks with aggressive behavior and outburst.  Right now he seems to be doing well.  He can tell me his name and where he is.  He neurologically appears intact.  Does not seem to have any signs of parkinsonism or major memory issues on exam.  His vital signs are reassuring.  He has no infectious symptoms.  He has no neck pain or headache.  Differential diagnosis could be some organic process like dehydration or electrolyte abnormality but have low suspicion for stroke or other acute neurologic process.  I do suspect that this is likely behavioral/dementia related.  CBC, CMP, urinalysis, chest x-ray, head CT was obtained and per my review interpretation unremarkable.  There is no brain bleed or head mass.  There is no pneumonia or pneumothorax on x-ray.  There is no significant anemia, electrolyte abnormality kidney injury.  There is no urinary tract infection.  Overall workup is unremarkable.  I talked with psychiatry for them to do an evaluation but they prefer that he get an MRI to further rule out neurological processes.  I think that is reasonable.  I talked with Dr. Gwenlyn Fudge at [facility] and we will transfer him there to get MRI and to have TTS and TOC workup.  Family does not feel safe taking him home.  Patient has been on trazodone the last few nights which may be has helped his sleep a little bit.  But overall he is not sleeping well and he is hallucinating during the day at times and having  aggressive behavior.  No history of the same otherwise.  Patient hemodynamically stable throughout my care.  This chart was dictated using voice recognition software.  Despite best efforts to proofread,  errors can occur which can  change the documentation meaning.         Final Clinical Impression(s) / ED Diagnoses Final diagnoses:  Aggressive behavior    Rx / DC Orders ED Discharge Orders     None         Virgina Norfolk, DO 12/17/22 2323

## 2022-12-17 NOTE — ED Triage Notes (Addendum)
Ambulatory to room 16. NAD.   Comes in to the ER for reported hallucinations x3 weeks. Has become more frequent. Visual and auditory hallucinations- seeing 'numerous people.' Paranoid thoughts. This recent worsening seem to have precipitated after a very stressful life event. Difficulty sleeping.Memory loss at baseline. HX small vessel disease no formal dementia diagnoses-though seen in EPIC HX. Family also reports worsening balance, dropping items, poor depth perception x2 weeks. Pt is pleasant and cooperative in triage.

## 2022-12-18 ENCOUNTER — Other Ambulatory Visit (HOSPITAL_BASED_OUTPATIENT_CLINIC_OR_DEPARTMENT_OTHER): Payer: Self-pay

## 2022-12-18 DIAGNOSIS — F03A4 Unspecified dementia, mild, with anxiety: Secondary | ICD-10-CM

## 2022-12-18 DIAGNOSIS — R441 Visual hallucinations: Secondary | ICD-10-CM | POA: Diagnosis present

## 2022-12-18 LAB — VITAMIN B12: Vitamin B-12: 255 pg/mL (ref 180–914)

## 2022-12-18 LAB — TSH: TSH: 0.348 u[IU]/mL — ABNORMAL LOW (ref 0.350–4.500)

## 2022-12-18 LAB — FOLATE: Folate: 9.2 ng/mL (ref >5.9)

## 2022-12-18 MED ORDER — HALOPERIDOL 1 MG PO TABS
2.0000 mg | ORAL_TABLET | Freq: Two times a day (BID) | ORAL | Status: DC | PRN
  Administered 2022-12-18: 2 mg via ORAL
  Filled 2022-12-18: qty 2

## 2022-12-18 MED ORDER — HALOPERIDOL 2 MG PO TABS
2.0000 mg | ORAL_TABLET | Freq: Two times a day (BID) | ORAL | 1 refills | Status: DC | PRN
  Filled 2022-12-18: qty 30, 15d supply, fill #0

## 2022-12-18 NOTE — Progress Notes (Signed)
TTS consult pending for evaluation.

## 2022-12-18 NOTE — Discharge Instructions (Addendum)
Prescription sent to the pharmacy.  Follow up as an outpatient.  MRI unchanged since 2023

## 2022-12-18 NOTE — ED Notes (Signed)
Patient received in my care from [facility]

## 2022-12-18 NOTE — ED Provider Notes (Signed)
MRI without acute changes.  Patient has been cleared by psychiatry.  Spoke with patient's wife and family member and they are comfortable getting outpatient follow-up.  He is going to start Haldol.   Gwyneth Sprout, MD 12/18/22 1339

## 2022-12-18 NOTE — Consult Note (Signed)
[facility] ED Discharge   Reason for Consult:  Aggressive Behaviors and Hallucinations Referring Physician:  EDP  Patient Identification: Johnny [name] MRN:  [id number] ED Chief Complaint: Mild dementia with anxiety [facility])  Diagnosis:  Principal Problem:   Mild dementia with anxiety (HCC) Active Problems:   Hallucinations, visual   ED Assessment Time Calculation: Start Time: 1105 Stop Time: 1200 Total Time in Minutes (Assessment Completion): 55   Subjective:   Johnny [name] is a 72 y.o. male patient admitted with visual hallucinations and paranoid thoughts. Family brought him in due to aggressive behavior episodes, and feels it is no longer safe with him in the home. He has been married for 46 years, father of [phone]), and has (7) grand kids. 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 Wife and (1) son Luella Cook. Are present at bedside, consent is obtained to speak with them being present. Son reports there are (2) adult children and (5) grandchildren living in the home. All of whom are invading his space and increasing stimulation this has caused his symptoms to increase.   HPI:  Patient admitted with worsening cognitive impairment, psychotic behaviors (hallucinations, paranoia, and delusions), and decrease in mental status.  Patient presented as a walk-in to [facility]  spouse and his son. Patient does admit to some decline in memory impairment however does not feel as though he needs to be in the hospital(family agrees).  Patient does state that his family is concerned about him so he agreed to come to the hospital and be evaluated. Patient is a poor historian and has great difficulty finding words, most of the interview comes from his wife. At times he will agree by saying "  I was going to say that. You couldn't have said it better."  Wife reports no previous psychiatric diagnosis(except ADD), suicide attempts, and or  inpatient admission.  Wife reports that at times he gets up at night to look for the people (hallucinations/delusions), and this keeps his up all night. She describes his sleep as poor. She is afraid that he is becoming more threatening. When assessing for losses she reports that he has a loss of identity and self since he has started to decline. She reports some "bipolar episodes", he is an easy going person and then will switch, develop this stare and begin to look away and become very accusatory.  Denies history of substances use, illicit substances or alcohol. On evaluation he also denies current suicidal ideation, suicidal thoughts, and or suicidal gestures.   On evaluation patient is alert and oriented, calm and cooperative and actively participates in the assessment.  Patient does show signs of memory impairment as evident by proper orientation, memory recall, and insight. 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 Son confirms that he is a retired IT sales professional from [employer]. Discussed with family current findings that are consistent with early stages of dementia, and he is agreeable for follow-up outside of the hospital.  Also reviewed patient medical evaluation, and appears he has been medically cleared by the emergency room physician to include a CT of the head, MRI of the brain, routine labs, chest x-ray, and urine studies.  Only abnormal finding was MRI of the brain and abnormal CBC, TSH.   He is observed talking to staff, and conversation appears to be appropriate.  His mood is euthymic and his affect  is congruent and appropriate. Writer was able to gain his attention and he is easily redirected.  Patient appears to improving from when he originally presented to the ER. Patient may benefit from neuropsychological evaluation and ongoing treatment with neurology.  Will psych clear at this time, it is encouraged that we continue current medications and  safety measures. Through means of shared decision making, we were able to agree on Haldol vs Seroquel for management of agitation, psychosis and hallucinations. Family decided on Haldol after we reviewed side effect profile. Patient is able to take care of self, however will need more social interaction and person centered care to help with worsening memory impairment as disease progresses. Family verbalized understanding. Writer made all attempts to answer questions and address all concerns with both of them. They verbalize understanding.  Past Psychiatric History: ADD, history of taking Methylphenidate. Pt denies ever been hospitalized for mental health concerns in the past. Denies any previous history of suicidal thoughts, suicidal ideations, and or non suicidal self injurious behaviors. Pt denies history of aggression, agitation, violent behavior, and or history of homicidal ideations/thoughts.  Patient further denies any current, previous legal charges.  Patient further denies access to guns, weapons, or any engagement with the legal system.  Patient denies history of illicit substances to include synthetic substances, any cannabidiol, supplemental herbs.    Risk to Self or Others: Is the patient at risk to self? No Has the patient been a risk to self in the past 6 months? No Has the patient been a risk to self within the distant past? No Is the patient a risk to others? Yes Has the patient been a risk to others in the past 6 months? No Has the patient been a risk to others within the distant past? No  [place] Scale:  Flowsheet Row ED from 12/17/2022 in [facility] Emergency Department at [facility] ED from 10/01/2022 in [facility] Emergency Department at [facility] ED from 10/26/2021 in [facility] Emergency Department at [facility]  C-SSRS RISK CATEGORY No Risk No Risk No Risk       AIMS:  , , ,  ,   ASAM:    Substance Abuse:     Past Medical History:  Past  Medical History:  Diagnosis Date   Adenomatous polyps    Bronchitis    CAD in native artery 2017   Chronic back pain    GERD (gastroesophageal reflux disease)    Hyperlipidemia    Hypertension    Internal hemorrhoids    Low testosterone    Myocardial infarction (HCC)    Osteopenia    Shortness of breath dyspnea    Spondylosis    TIA (transient ischemic attack) 2008   Ureterolithiasis    Ventral hernia    Vitamin D deficiency     Past Surgical History:  Procedure Laterality Date   BACK SURGERY     2 surgeries   CARDIAC CATHETERIZATION N/A 09/12/2015   Procedure: Left Heart Cath and Coronary Angiography;  Surgeon: Runell Gess, MD;  Location: [facility] INVASIVE CV LAB;  Service: Cardiovascular;  Laterality: N/A;   CORONARY STENT INTERVENTION N/A 06/23/2019   Procedure: CORONARY STENT INTERVENTION;  Surgeon: Tonny Bollman, MD;  Location: [facility] INVASIVE CV LAB;  Service: Cardiovascular;  Laterality: N/A;   LEFT HEART CATH AND CORONARY ANGIOGRAPHY N/A 01/27/2018   Procedure: LEFT HEART CATH AND CORONARY ANGIOGRAPHY;  Surgeon: Corky Crafts, MD;  Location: [facility] INVASIVE CV LAB;  Service:  Cardiovascular;  Laterality: N/A;   LEFT HEART CATH AND CORONARY ANGIOGRAPHY N/A 06/23/2019   Procedure: LEFT HEART CATH AND CORONARY ANGIOGRAPHY;  Surgeon: Tonny Bollman, MD;  Location: [facility] INVASIVE CV LAB;  Service: Cardiovascular;  Laterality: N/A;   LEFT HEART CATH AND CORONARY ANGIOGRAPHY N/A 06/24/2020   Procedure: LEFT HEART CATH AND CORONARY ANGIOGRAPHY;  Surgeon: Lennette Bihari, MD;  Location: [facility] INVASIVE CV LAB;  Service: Cardiovascular;  Laterality: N/A;   Family History:  Family History  Problem Relation Age of Onset   Heart disease Mother    Stroke Mother    Heart attack Mother    Emphysema Father    Family Psychiatric  History: Maternal GF, Mother, Celine Ahr, Sister, brother diagnosed dementia.  Social History:  Social History   Substance and Sexual Activity  Alcohol Use No    Alcohol/week: 0.0 standard drinks of alcohol   Comment: rarely     Social History   Substance and Sexual Activity  Drug Use No    Social History   Socioeconomic History   Marital status: Married    Spouse name: Not on file   Number of children: 4   Years of education: 14   Highest education level: Not on file  Occupational History   Occupation: Radio broadcast assistant  Tobacco Use   Smoking status: Former    Current packs/day: 0.00    Types: Cigarettes    Start date: 01/05/1968    Quit date: 01/04/1978    Years since quitting: 44.9   Smokeless tobacco: Never  Vaping Use   Vaping status: Never Used  Substance and Sexual Activity   Alcohol use: No    Alcohol/week: 0.0 standard drinks of alcohol    Comment: rarely   Drug use: No   Sexual activity: Not on file  Other Topics Concern   Not on file  Social History Narrative   Lives at home with his wife.   Right-handed.   2 cups caffeine per day.      Social history: He is married.  He had a son involved in a serious motor vehicle accident which took over a year to recover.  His wife has multiple sclerosis and is disabled.  He is a retired Company secretary.  He does not smoke.  Very occasional alcohol consumption.  2 other adult children.       Social Determinants of Health   Financial Resource Strain: Not on file  Food Insecurity: Not on file  Transportation Needs: Not on file  Physical Activity: Not on file  Stress: Not on file  Social Connections: Unknown (10/03/2021)   Received from [facility]   Social Network    Social Network: Not on file   Additional Social History:    Allergies:   Allergies  Allergen Reactions   Lipitor [Atorvastatin] Other (See Comments)    Myalgia   Praluent [Alirocumab] Other (See Comments)    Muscle pain and flu like symptoms   Crestor [Rosuvastatin] Other (See Comments)    Myalgia   Imdur [Isosorbide Nitrate] Other (See Comments)    Severe Headaches   Hydrocodone Nausea Only     Can take with food   Levaquin [Levofloxacin] Nausea And Vomiting   Metadate Cd [Methylphenidate Hcl] Other (See Comments)    Burning, GI upset, bloating   Morphine And Codeine Nausea And Vomiting   Pravachol [Pravastatin] Other (See Comments)    myalgia    Labs:  Results for orders placed or performed during the hospital  encounter of 12/17/22 (from the past 48 hour(s))  CBC with Differential     Status: Abnormal   Collection Time: 12/17/22  6:50 PM  Result Value Ref Range   WBC 6.9 4.0 - 10.5 K/uL   RBC 3.69 (L) 4.22 - 5.81 MIL/uL   Hemoglobin 12.6 (L) 13.0 - 17.0 g/dL   HCT 13.0 (L) 86.5 - 78.4 %   MCV 101.4 (H) 80.0 - 100.0 fL   MCH 34.1 (H) 26.0 - 34.0 pg   MCHC 33.7 30.0 - 36.0 g/dL   RDW 69.6 29.5 - 28.4 %   Platelets 201 150 - 400 K/uL   nRBC 0.0 0.0 - 0.2 %   Neutrophils Relative % 54 %   Neutro Abs 3.8 1.7 - 7.7 K/uL   Lymphocytes Relative 33 %   Lymphs Abs 2.2 0.7 - 4.0 K/uL   Monocytes Relative 10 %   Monocytes Absolute 0.7 0.1 - 1.0 K/uL   Eosinophils Relative 2 %   Eosinophils Absolute 0.1 0.0 - 0.5 K/uL   Basophils Relative 1 %   Basophils Absolute 0.1 0.0 - 0.1 K/uL   Immature Granulocytes 0 %   Abs Immature Granulocytes 0.01 0.00 - 0.07 K/uL    Comment: Performed at [facility], [address], [place], [place] [address]  Comprehensive metabolic panel     Status: Abnormal   Collection Time: 12/17/22  6:50 PM  Result Value Ref Range   Sodium 140 135 - 145 mmol/L   Potassium 3.8 3.5 - 5.1 mmol/L   Chloride 106 98 - 111 mmol/L   CO2 27 22 - 32 mmol/L   Glucose, Bld 84 70 - 99 mg/dL    Comment: Glucose reference range applies only to samples taken after fasting for at least 8 hours.   BUN 17 8 - 23 mg/dL   Creatinine, Ser 0.10 0.61 - 1.24 mg/dL   Calcium 9.5 8.9 - 27.2 mg/dL   Total Protein 6.4 (L) 6.5 - 8.1 g/dL   Albumin 4.0 3.5 - 5.0 g/dL   AST 12 (L) 15 - 41 U/L   ALT 11 0 - 44 U/L   Alkaline Phosphatase 42 38 - 126 U/L    Total Bilirubin 0.7 0.3 - 1.2 mg/dL   GFR, Estimated >53 >66 mL/min    Comment: (NOTE) Calculated using the CKD-EPI Creatinine Equation (2021)    Anion gap 7 5 - 15    Comment: Performed at [facility], [address], [place], [place] [address]  Lipase, blood     Status: None   Collection Time: 12/17/22  6:50 PM  Result Value Ref Range   Lipase 25 11 - 51 U/L    Comment: Performed at [facility], [address] [place], [place] [address]  Urinalysis, Routine w reflex microscopic -Urine, Clean Catch     Status: Abnormal   Collection Time: 12/17/22  6:50 PM  Result Value Ref Range   Color, Urine YELLOW YELLOW   APPearance CLEAR CLEAR   Specific Gravity, Urine 1.024 1.005 - 1.030   pH 6.0 5.0 - 8.0   Glucose, UA NEGATIVE NEGATIVE mg/dL   Hgb urine dipstick TRACE (A) NEGATIVE   Bilirubin Urine NEGATIVE NEGATIVE   Ketones, ur NEGATIVE NEGATIVE mg/dL   Protein, ur NEGATIVE NEGATIVE mg/dL   Nitrite NEGATIVE NEGATIVE   Leukocytes,Ua NEGATIVE NEGATIVE   RBC / HPF 0-5 0 - 5 RBC/hpf   WBC, UA 0-5 0 - 5 WBC/hpf   Bacteria, UA NONE  SEEN NONE SEEN   Squamous Epithelial / HPF 0-5 0 - 5 /HPF   Mucus PRESENT     Comment: Performed at [facility], [address], [place], [place] [address]  TSH     Status: Abnormal   Collection Time: 12/18/22 10:58 AM  Result Value Ref Range   TSH 0.348 (L) 0.350 - 4.500 uIU/mL    Comment: Performed by a 3rd Generation assay with a functional sensitivity of <=0.01 uIU/mL. Performed at [facility], [address] [place], [place] [address]     Current Facility-Administered Medications  Medication Dose Route Frequency Provider Last Rate Last Admin   clopidogrel (PLAVIX) tablet 75 mg  75 mg Oral Daily Curatolo, Adam, DO       haloperidol (HALDOL) tablet 2 mg  2 mg Oral BID PRN Starkes-Perry, Juel Burrow, FNP       nebivolol (BYSTOLIC) tablet 2.5 mg  2.5 mg Oral Daily Curatolo,  Adam, DO       ranolazine (RANEXA) 12 hr tablet 500 mg  500 mg Oral BID Curatolo, Adam, DO       rosuvastatin (CRESTOR) tablet 5 mg  5 mg Oral Daily Curatolo, Adam, DO       traZODone (DESYREL) tablet 50 mg  50 mg Oral BID BM & HS PRN Virgina Norfolk, DO       Current Outpatient Medications  Medication Sig Dispense Refill   butalbital-acetaminophen-caffeine (FIORICET) 50-325-40 MG tablet Take 1 tablet by mouth every 6 (six) hours as needed for headache. 12 tablet 5   clopidogrel (PLAVIX) 75 MG tablet Take 1 tablet (75 mg total) by mouth daily. 90 tablet 1   haloperidol (HALDOL) 2 MG tablet Take 1 tablet (2 mg total) by mouth 2 (two) times daily as needed for agitation (agitation, hallucinations). 30 tablet 1   HYDROcodone-acetaminophen (NORCO) 10-325 MG tablet Take 1 tablet by mouth every 6 (six) hours as needed for up to 12 doses. 12 tablet 0   HYDROcodone-acetaminophen (NORCO) 10-325 MG tablet Take 1 tablet by mouth every 4 (four) hours as needed for pain. 180 tablet 0   HYDROcodone-acetaminophen (NORCO) 10-325 MG tablet Take 1 tablet by mouth every 4 hours as needed for pain. 180 tablet 0   nebivolol (BYSTOLIC) 2.5 MG tablet TAKE 1 TABLET BY MOUTH EVERY DAY 90 tablet 3   nitroGLYCERIN (NITROSTAT) 0.4 MG SL tablet Place 1 tablet (0.4 mg total) under the tongue every 5 (five) minutes as needed for chest pain. 25 tablet 1   ranolazine (RANEXA) 500 MG 12 hr tablet TAKE 1 TABLET BY MOUTH TWICE A DAY 180 tablet 1   rosuvastatin (CRESTOR) 5 MG tablet Take 1 tablet (5 mg total) by mouth daily. 90 tablet 3   sucralfate (CARAFATE) 1 g tablet Take 1 g by mouth 2 (two) times daily as needed. [place]     traZODone (DESYREL) 50 MG tablet Take 50 mg by mouth 3 times/day as needed-between meals & bedtime for sleep.     vitamin B-12 (CYANOCOBALAMIN) 500 MCG tablet Take 1 tablet (500 mcg total) by mouth daily. 30 tablet 3    Musculoskeletal: Strength & Muscle Tone: within normal limits Gait & Station:  normal Patient leans: N/A   Psychiatric Specialty Exam: Presentation  General Appearance:  Appropriate for Environment; Casual  Eye Contact: Good  Speech: Clear and Coherent; Normal Rate  Speech Volume: Normal  Handedness: Right   Mood and Affect  Mood: Euthymic  Affect: Appropriate; Congruent   Thought  Process  Thought Processes: Coherent; Linear  Descriptions of Associations:Intact  Orientation:Full (Time, Place and Person)  Thought Content:Logical  History of Schizophrenia/Schizoaffective disorder:No data recorded Duration of Psychotic Symptoms:No data recorded Hallucinations:Hallucinations: None  Ideas of Reference:None  Suicidal Thoughts:Suicidal Thoughts: No  Homicidal Thoughts:Homicidal Thoughts: No   Sensorium  Memory: Remote Fair; Recent Poor; Immediate Poor  Judgment: Poor  Insight: Shallow   Executive Functions  Concentration: Fair  Attention Span: Fair  Recall: Poor  Fund of Knowledge: Fair  Language: Fair   Psychomotor Activity  Psychomotor Activity: Psychomotor Activity: Normal   Assets  Assets: Communication Skills; Desire for Improvement; Physical Health; Resilience; Social Support; Tax adviser; Talents/Skills; Housing; Health and safety inspector; Leisure Time    Sleep  Sleep: Sleep: Poor   Physical Exam: Physical Exam Vitals and nursing note reviewed.  Constitutional:      Appearance: Normal appearance. He is normal weight.  Skin:    Capillary Refill: Capillary refill takes less than 2 seconds.  Neurological:     General: No focal deficit present.     Mental Status: He is alert and oriented to person, place, and time. Mental status is at baseline.  Psychiatric:        Attention and Perception: Attention and perception normal.        Mood and Affect: Mood and affect normal.        Speech: Speech is delayed.        Behavior: Behavior normal. Behavior is  cooperative.        Thought Content: Thought content normal.        Cognition and Memory: Cognition is impaired. Memory is impaired. He exhibits impaired recent memory and impaired remote memory.        Judgment: Judgment is inappropriate.     Comments: None observed during my evaluation    ROS Blood pressure (!) 146/82, pulse 65, temperature 98.1 F (36.7 C), temperature source Oral, resp. rate 16, height 6\' 1"  (1.854 m), weight 85.3 kg, SpO2 98%. Body mass index is 24.8 kg/m.  Medical Decision Making: Patient came to the emergency room with worsenign memory impairment, agitation and hallucinations suspected to dementia. MRI abnormal, small vessel ischemia, volume loss, and chronic petechiae in the right occipital lobe. Family is interested in medication to help manage symptoms, inpatient psych was offered for psychiatric evaluation (Dementia vs MDD), however family declined and feels comfortable returning him home. Son plans to relocate some of the family members and the RV, to help decrease stimulation. Will trial Haldol 2mg  po for management of agitation and demented behaviors. He denies any suicidal thoughts, feelings, intentions or plans. There are no psychotic symptoms observed while providing care to the patient. He has been referred to outpatient psychiatry and psychotherapy for follow-up.    Problem 1: Mild Cognitive impairment -Haldol 2mg  po BID prn, rx has been sent to the pharmacy.  -AVS updated to reflect neuropsychologist, current appt with [facility] is 08/2023.  -Follow up with [facility] for dementia medications. -Offer family support groups and resources for caregivers for dementia patients. -Advised against use of CBD products at this time.  Problem 2:Hallucinations -EKG QTc 389 -Side effect profile reviewed, decided on Haldol. WIll administered one dose while in the ED monitor for side effects and DC home. Family has agreed with this plan.   Problem 3:   Disposition: No  evidence of imminent risk to self or others at present.   Patient does not meet criteria for psychiatric inpatient admission. Supportive therapy provided about  ongoing stressors. Discussed crisis plan, support from social network, calling 911, coming to the Emergency Department, and calling Suicide Hotline.  Maryagnes Amos, FNP 12/18/2022 12:10 PM

## 2022-12-18 NOTE — ED Provider Notes (Signed)
Patient transferred here from [facility] for [facility].  See prior note for full H&P.  Briefly, 72 year old male presenting to the ED with change in behavior, paranoia, and some hallucinations.  Case had been discussed with psychiatry, however requested MRI for full medical clearance prior to evaluating him.  MRI has been obtained here and is negative for any acute findings, does have some chronic findings but nothing that should be contributing to behavior today.  Repeat TTS consult has been placed for formal evaluation.  6:40 AM TTS not done overnight.  Day team to follow-up on recommendations once completed.  Results for orders placed or performed during the hospital encounter of 12/17/22  CBC with Differential  Result Value Ref Range   WBC 6.9 4.0 - 10.5 K/uL   RBC 3.69 (L) 4.22 - 5.81 MIL/uL   Hemoglobin 12.6 (L) 13.0 - 17.0 g/dL   HCT 84.1 (L) 32.4 - 40.1 %   MCV 101.4 (H) 80.0 - 100.0 fL   MCH 34.1 (H) 26.0 - 34.0 pg   MCHC 33.7 30.0 - 36.0 g/dL   RDW 02.7 25.3 - 66.4 %   Platelets 201 150 - 400 K/uL   nRBC 0.0 0.0 - 0.2 %   Neutrophils Relative % 54 %   Neutro Abs 3.8 1.7 - 7.7 K/uL   Lymphocytes Relative 33 %   Lymphs Abs 2.2 0.7 - 4.0 K/uL   Monocytes Relative 10 %   Monocytes Absolute 0.7 0.1 - 1.0 K/uL   Eosinophils Relative 2 %   Eosinophils Absolute 0.1 0.0 - 0.5 K/uL   Basophils Relative 1 %   Basophils Absolute 0.1 0.0 - 0.1 K/uL   Immature Granulocytes 0 %   Abs Immature Granulocytes 0.01 0.00 - 0.07 K/uL  Comprehensive metabolic panel  Result Value Ref Range   Sodium 140 135 - 145 mmol/L   Potassium 3.8 3.5 - 5.1 mmol/L   Chloride 106 98 - 111 mmol/L   CO2 27 22 - 32 mmol/L   Glucose, Bld 84 70 - 99 mg/dL   BUN 17 8 - 23 mg/dL   Creatinine, Ser 4.03 0.61 - 1.24 mg/dL   Calcium 9.5 8.9 - 47.4 mg/dL   Total Protein 6.4 (L) 6.5 - 8.1 g/dL   Albumin 4.0 3.5 - 5.0 g/dL   AST 12 (L) 15 - 41 U/L   ALT 11 0 - 44 U/L   Alkaline Phosphatase 42 38 - 126 U/L   Total  Bilirubin 0.7 0.3 - 1.2 mg/dL   GFR, Estimated >25 >95 mL/min   Anion gap 7 5 - 15  Lipase, blood  Result Value Ref Range   Lipase 25 11 - 51 U/L  Urinalysis, Routine w reflex microscopic -Urine, Clean Catch  Result Value Ref Range   Color, Urine YELLOW YELLOW   APPearance CLEAR CLEAR   Specific Gravity, Urine 1.024 1.005 - 1.030   pH 6.0 5.0 - 8.0   Glucose, UA NEGATIVE NEGATIVE mg/dL   Hgb urine dipstick TRACE (A) NEGATIVE   Bilirubin Urine NEGATIVE NEGATIVE   Ketones, ur NEGATIVE NEGATIVE mg/dL   Protein, ur NEGATIVE NEGATIVE mg/dL   Nitrite NEGATIVE NEGATIVE   Leukocytes,Ua NEGATIVE NEGATIVE   RBC / HPF 0-5 0 - 5 RBC/hpf   WBC, UA 0-5 0 - 5 WBC/hpf   Bacteria, UA NONE SEEN NONE SEEN   Squamous Epithelial / HPF 0-5 0 - 5 /HPF   Mucus PRESENT    MR BRAIN WO CONTRAST  Result Date: 12/18/2022 CLINICAL  DATA:  Acute neurologic deficit EXAM: MRI HEAD WITHOUT CONTRAST TECHNIQUE: Multiplanar, multiecho pulse sequences of the brain and surrounding structures were obtained without intravenous contrast. COMPARISON:  09/06/2021 FINDINGS: Brain: No acute infarct, mass effect or extra-axial collection. Chronic petechiae in the right occipital lobe, unchanged. There is multifocal hyperintense T2-weighted signal within the white matter. Generalized volume loss. The midline structures are normal. Vascular: Major flow voids are preserved. Skull and upper cervical spine: Normal calvarium and skull base. Visualized upper cervical spine and soft tissues are normal. Sinuses/Orbits:No paranasal sinus fluid levels or advanced mucosal thickening. No mastoid or middle ear effusion. Normal orbits. IMPRESSION: 1. No acute intracranial abnormality. 2. Chronic small vessel ischemia and volume loss. 3. Chronic petechiae in the right occipital lobe, unchanged. Electronically Signed   By: Deatra Robinson M.D.   On: 12/18/2022 00:33   CT Head Wo Contrast  Result Date: 12/17/2022 CLINICAL DATA:  Altered mental status.  EXAM: CT HEAD WITHOUT CONTRAST TECHNIQUE: Contiguous axial images were obtained from the base of the skull through the vertex without intravenous contrast. RADIATION DOSE REDUCTION: This exam was performed according to the departmental dose-optimization program which includes automated exposure control, adjustment of the mA and/or kV according to patient size and/or use of iterative reconstruction technique. COMPARISON:  Oct 01, 2022 FINDINGS: Brain: There is mild cerebral atrophy with widening of the extra-axial spaces and ventricular dilatation. There are areas of decreased attenuation within the white matter tracts of the supratentorial brain, consistent with microvascular disease changes. Vascular: No hyperdense vessel or unexpected calcification. Skull: Normal. Negative for fracture or focal lesion. Sinuses/Orbits: No acute finding. Other: None. IMPRESSION: 1. Generalized cerebral atrophy with chronic white matter small vessel ischemic changes. 2. No acute intracranial abnormality. Electronically Signed   By: Aram Candela M.D.   On: 12/17/2022 20:45   DG Chest Portable 1 View  Result Date: 12/17/2022 CLINICAL DATA:  Altered mental status. EXAM: PORTABLE CHEST 1 VIEW COMPARISON:  Chest radiograph dated 10/26/2021 and CT dated 10/01/2022. FINDINGS: Minimal right lung base atelectasis. No focal consolidation, pleural effusion, or pneumothorax. The cardiac silhouette is within normal limits. No acute osseous pathology. IMPRESSION: No active disease. Electronically Signed   By: Elgie Collard M.D.   On: 12/17/2022 20:27      Garlon Hatchet, PA-C 12/18/22 [id number]    Glynn Octave, MD 12/18/22 [phone]

## 2022-12-24 ENCOUNTER — Ambulatory Visit: Payer: Medicare Other | Admitting: Internal Medicine

## 2022-12-24 ENCOUNTER — Telehealth: Payer: Self-pay

## 2022-12-24 NOTE — Telephone Encounter (Signed)
Patients wife called. States they cannot get into [facility] until [place]. They have tried haldol which was bad and seroquil in the hospital which is worse. She doesn't know what to do. She left the hospital because they wanted to keep him strapped with the seroquil and she didn't want that. She states that she can call his current neurologist Dr Debarah Crape but that you had told her to see Dr. Shelva Majestic for testing. He has advanced front temporal dementia and she doesn't know what to do. Please advise.

## 2022-12-24 NOTE — Telephone Encounter (Signed)
Results have been relayed to the patient/authorized caretaker. The patient/authorized caretaker verbalized understanding. No questions at this time.   

## 2022-12-27 ENCOUNTER — Telehealth: Payer: Self-pay | Admitting: Neurology

## 2022-12-27 ENCOUNTER — Encounter: Payer: Self-pay | Admitting: Psychology

## 2022-12-27 ENCOUNTER — Encounter: Payer: Medicare Other | Attending: Psychology | Admitting: Psychology

## 2022-12-27 DIAGNOSIS — R41840 Attention and concentration deficit: Secondary | ICD-10-CM

## 2022-12-27 DIAGNOSIS — R4189 Other symptoms and signs involving cognitive functions and awareness: Secondary | ICD-10-CM

## 2022-12-27 DIAGNOSIS — F0390 Unspecified dementia without behavioral disturbance: Secondary | ICD-10-CM

## 2022-12-27 DIAGNOSIS — R441 Visual hallucinations: Secondary | ICD-10-CM

## 2022-12-27 NOTE — Telephone Encounter (Signed)
Patient came in to the lobby and asking for something to be prescribed to help him sleep. His wife is advising that Dr. Olean Ree diagnosed the patient with lewy bodies disease. Since he diagnosis and gives therapy he is asking the neurologist to prescribe. Patient needs sleep help with rest and hallucinations.

## 2022-12-27 NOTE — Progress Notes (Signed)
Neuropsychological Consultation   Patient:   Johnny [name]   DOB:   20-Dec-1950  MR Number:  [id number]  Location:  [facility] FOR PAIN AND [facility] MEDICINE [facility] PHYSICAL MEDICINE & REHABILITATION [address] [place], [place] [address] [place] [address] Dept: [phone]           Date of Service:   12/27/2022  Location of Service and Individuals present: Today's visit was conducted in my outpatient clinic office with the patient, his wife and his son present along with myself.  Start Time:   10 AM End Time:   12 PM  1 hour and 15 minutes was spent in face-to-face clinical interview and the other 45 minutes was spent with record review, report writing and setting up testing protocols.  Patient Consent and Confidentiality: Limits of confidentiality were reviewed including the request for neuropsychological evaluation and report being provided to the patient's neurology practice as well as his PCP Johnny [name]. Johnny Fellers, MD.  Consent for Evaluation and Treatment:  Signed:  Yes Explanation of Privacy Policies:  Signed:  Yes Discussion of Confidentiality Limits:  Yes  Provider/Observer:  Arley Phenix, Psy.D.       Clinical Neuropsychologist       Billing Code/Service: [id number]  Chief Complaint:     Chief Complaint  Patient presents with   Memory Loss   Agitation   Gait Problem   Anxiety   Delusional   Hallucinations   Paranoid   Sleeping Problem   Tremors    Reason for Service:    Johnny [name] is a 72 year old male referred for neuropsychological evaluation by his PCP Johnny [name]. Baxley, MD along with the patient also being followed by [facility] and is most recently seen by Johnny Ege, NP with previous diagnoses of mild dementia unspecified type and complicated migraines.  The patient has had multiple episodes of acute worsening of his status with altered mental status and has recently presented to both [facility] for agitation  and aggressive behaviors as well as presenting to Johnny [name] for altered mental status at the end of July/beginning of August.  The patient had been given Haldol while at [facility] and had adverse response including worsening of his symptoms that persisted for several hours.  He Leander Rams presented a couple of days later to [facility] system because of acute worsening and altered mental status.  While these acute symptoms have improved he did not respond well to various medication efforts.  The patient's family reports that they for started noticing difficulties sometime around 2021.  These difficulties were mostly around memory changes without significant changes in language.  He began having more motor changes that are exacerbated during times of acute change.  Visual hallucinations started shortly after that and initially were simply the patient being people around him not developing delusional belief systems around it.  However, with progressive cognitive decline I was initially felt to be a normal aging his visual hallucination of seeing people progressively worsened until he has seen a lot of people and other visual disturbance and hallucinations and the development of constructed memories and delusions etc.  There has been a lot of stress in the household with 2 adults and 5 grandchildren living in the house along with the patient and his wife and a lot of drama with stress and anxiety.  Has stress and anxiety worsened his symptoms also tend to worsen.  The patient has had severe disturbance of  sleep.  Changes in energy and motivation, agitation, tics and other motor changes are all noted.  Adverse response to Haldol including worsening agitation rather than sedation.  The patient has used trazodone in the past which is help with sleep but it is being less effective now.  When higher doses of trazodone are used the patient remains sedated into the day.  Previous attempts with Seroquel  and/or Haldol previously exacerbated his agitation.  The patient has past medical history reviewed below including previous diagnosis of ADHD.  Patient is also very hard of hearing.  Patient had TIA/migraine event in 2008 and back surgery in 2010 and a heart attack in 2017.  2022 there were stents placed.  2023 there was a migraine with aphasia symptoms developing and again in 2024.  Memory decline, cognitive processing and comprehension changes, visual hallucinations and delusional patterns, weakness, mobility issues and vision impairments are all progressively worsening.  There is a positive family history for progressive dementia's with both the patient's brother and sister beginning to set her signs of cognitive loss and dementia prior to their death.  The patient's mother also developed progressive cognitive decline.  Medical History:   Past Medical History:  Diagnosis Date   Adenomatous polyps    Bronchitis    CAD in native artery 2017   Chronic back pain    GERD (gastroesophageal reflux disease)    Hyperlipidemia    Hypertension    Internal hemorrhoids    Low testosterone    Myocardial infarction (HCC)    Osteopenia    Shortness of breath dyspnea    Spondylosis    TIA (transient ischemic attack) 2008   Ureterolithiasis    Ventral hernia    Vitamin D deficiency          Patient Active Problem List   Diagnosis Date Noted   Hallucinations, visual 12/18/2022   Aortic atherosclerosis (HCC) 11/02/2021   Lumbar radiculopathy 10/19/2021   Cerebral vascular disease 10/19/2021   Complicated migraine 10/19/2021   Cognitive impairment 10/19/2021   Mild dementia with anxiety (HCC) 10/19/2021   Aphasia 09/06/2021   Headache 09/06/2021   Statin myopathy 04/05/2021   Atelectasis of both lungs 01/26/2018   Renal mass 01/26/2018   TIA (transient ischemic attack) 01/25/2018   Exertional dyspnea 09/23/2015   CAP (community acquired pneumonia) 09/23/2015   Lung nodule 09/23/2015    Cough 09/23/2015   Pneumonitis    Coronary artery disease of native artery of native heart with stable angina pectoris (HCC)    NSTEMI (non-ST elevated myocardial infarction) (HCC)    Chest pain 09/10/2015   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   Osteopenia 01/20/2012   Hx of adenomatous colonic polyps 01/20/2012   GE reflux 01/20/2012   Vitamin D deficiency 01/20/2012   Low serum testosterone level 01/20/2012   Erectile dysfunction 01/20/2012   Impaired glucose tolerance 01/20/2012   Chronic back pain 01/20/2012   Depression 02/01/2011   ATTENTION OR CONCENTRATION DEFICIT 01/11/2010   Hyperlipidemia LDL goal <70 11/30/2008   Essential hypertension 11/30/2008   DEGENERATIVE JOINT DISEASE 11/30/2008    Onset and Duration of Symptoms: Symptoms first noticed by family around 2021 initially with memory and gait changes and soon followed by the beginning of visual hallucinations that have progressively worsened to significant almost constant visual hallucinations with the development of constructive memories, delusions and paranoia.  Progressively worsening difficulties with sleep with difficulties falling asleep and staying asleep.  Additional Tests and Measures from other records:  Neuroimaging Results: Patient  had a brain MRI conducted on 12/17/2022 with comparison to previous MRI done on 09/06/2021.  There was no indication of acute process.  Chronic petechiane and right occipital lobe noted that was unchanged.  There are multifocal hyperintense T2 weighted signal within the white matter.  Generalized volume loss was also noted.  Impression included chronic small vessel ischemia with volume loss.  Laboratory Tests: HIV and syphilis screening were nonreactive.  Patient has had measures of reduced vitamin D.  Other laboratory blood work readings were all generally within normal limits.  Current Typical Mood State:  Anxious, Suspicious, and Irritable  Sleep: Sleep severely impaired  Behavioral  Observation/Mental Status:   CHRISS BUSSMAN  presents as a 72 y.o.-year-old Right handed Caucasian Male who appeared his stated age. his dress was Appropriate and he was Well Groomed and his manners were Appropriate to the situation.  his participation was indicative of Inattentive behaviors.  There were physical disabilities noted.  he displayed an inappropriate level of cooperation and motivation.    Interactions:    Minimal Inattentive  Attention:   abnormal and attention span appeared shorter than expected for age  Memory:   abnormal; global memory impairment noted  Visuo-spatial:   abnormal  Speech (Volume):  low  Speech:   rapid; no clear word finding deficits but the patient was actively having visual hallucinations and significant confusion noted today but family reports that today was a rather good day relatively speaking.  Thought Process:  Circumstantial and Tangential  Disorganized, Delusional, Loose association, and Preoccupied  Though Content:  Delusions; not suicidal and not homicidal  Orientation:   person  Judgment:   Poor  Planning:   Poor  Affect:    Anxious  Mood:    Anxious  Insight:   Shallow  Intelligence:   normal  Marital Status/Living:  Patient was born and raised in [place] along with 2 siblings.  The patient has been married for 46 years and continues to live with his wife.  He had 1 previous marriage of 3 years.  The patient has 3 adult children age 52, 19 and 72.  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  Current Occupation:    Patient is retired.  Work History:   Patient spent 30 years working for the city of [place].  Hobbies and Interests: Music/golf  Impact of Symptoms on Work or School:  Patient is essentially reduced all social activity  other than spending time with family.   Psychiatric History:  Patient had a psychiatric diagnosis of ADD that was diagnosed during adulthood and children with diagnosis of ADD.  No other psychiatric history noted.   Family Med/Psych History:  Family History  Problem Relation Age of Onset   Heart disease Mother    Stroke Mother    Heart attack Mother    Emphysema Father     Impression/DX:   JADON MITTER is a 72 year old male referred for neuropsychological evaluation by his PCP Johnny [name]. Baxley, MD along with the patient also being followed by [facility] and is most recently seen by Johnny Ege, NP with previous diagnoses of mild dementia unspecified type and complicated migraines.  The patient has had multiple episodes of acute worsening of his status with altered mental status and has recently presented to both [facility] for agitation and aggressive behaviors as well as presenting to [facility] for altered mental status at the end of July/beginning of August.  The patient had been given Haldol while at [facility] and had adverse response including worsening of his symptoms that persisted for several hours.  He Leander Rams presented a couple of days later to [facility] system because of acute worsening and altered mental status.  While these acute symptoms have improved he did not respond well to various medication efforts.  The patient's family reports that they for started noticing difficulties sometime around 2021.  These difficulties were mostly around memory changes without significant changes in language.  He began having more motor changes that are exacerbated during times of acute change.  Visual hallucinations started shortly after that and initially were simply the patient being people around him not developing delusional belief systems around it.  However, with progressive cognitive decline I was initially felt to be a normal aging his  visual hallucination of seeing people progressively worsened until he has seen a lot of people and other visual disturbance and hallucinations and the development of constructed memories and delusions etc.  There has been a lot of stress in the household with 2 adults and 5 grandchildren living in the house along with the patient and his wife and a lot of drama with stress and anxiety.  Has stress and anxiety worsened his symptoms also tend to worsen.  The patient has had severe disturbance of sleep.  Changes in energy and motivation, agitation, tics and other motor changes are all noted.  Adverse response to Haldol including worsening agitation rather than sedation.  The patient has used trazodone in the past which is help with sleep but it is being less effective now.  When higher doses of trazodone are used the patient remains sedated into the day.  Previous attempts with Seroquel and/or Haldol previously exacerbated his agitation.  The patient has past medical history reviewed below including previous diagnosis of ADHD.  Patient is also very hard of hearing.  Patient had TIA/migraine event in 2008 and back surgery in 2010 and a heart attack in 2017.  2022 there were stents placed.  2023 there was a migraine with aphasia symptoms developing and again in 2024.  Memory decline, cognitive processing and comprehension changes, visual hallucinations and delusional patterns, weakness, mobility issues and vision impairments are all progressively worsening.  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  The patient has progressively worsened and has had acute worsening.  Sustained sleep deprivation, stress within the house with a lot of  psychosocial changes going on are all likely contributing to acute exacerbations.  I do not think formal neuropsychological testing is worth the stress that it would have on the patient and at this point I am unsure how much useful data it would supply.  I think the presentation is quite clear.  I have referred the patient back to his neurology practice but even that I am unsure how much they are going to be able to help manage her cope with the symptoms.  I did spend a good bit of time with the patient's wife and son talking about expected progression.  They had already been suspecting that he was  dealing with Lewy body dementia as it had been suggested previously and their research said that his symptoms and progression were consistent with that diagnosis.  Questions about potential medications to at least manage his behaviors were addressed.  Patient has responded well previously to trazodone for sleep but they are at the point where the amount of trazodone needed to help with sleep leaves him rather groggy and sleepy during the day.  However, previous attempts to medicate acute exacerbations and agitations with medications including Haldol and Seroquel actually exacerbated and made his agitation worse.  This is going to be a very complicated medical management and he is likely to continue to deteriorate.  We also addressed issues of what may be needed to do regarding managing his likely progressive changes.  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   Electronically Signed   _______________________ Arley Phenix, Psy.D. Clinical Neuropsychologist

## 2022-12-27 NOTE — Telephone Encounter (Signed)
I spoke with his wife. Having issues with insomnia, hallucination. Saw Dr. Robley Fries today, likely LBD. Major cognitive decline in July.   Will try trazodone 100 mg at bedtime for insomnia. Apparently Haldol, Seroquel worsened his mental status "like he was on LSD".   They have appointment with psych 01/03/23. I have attached d/c report below for Dr. Terrace Arabia to review.  Chart review, admission 12/20/2022 for AMS: #Altered mental status Despite having a known cognitive impairment, for at least, the last year, there has been a marked decline in cognitive function within the last three weeks that is concerning for an acute process. MRI scan from outside hospital on 7/30 shows chronic small vessel ischemia and volume loss consistent with vascular dementia, as well as chronic petechiae in the right occipital lobe (unchanged). However, vascular dementia does not explain findings of hallucinations or paranoia. CT scan obtained on 12/20/22 shows no acute intracranial abnormality. Possible differential is broad and includes Lewy body dementia, frontal-temporal dementia, autoimmune encephalitis, Creutzfeldt-Jakob disease, normal pressure hydrocephalus, and syphilis. 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 He's not currently on any medication whose use or withdrawal would explain the symptoms. Blood toxicology negative for alcohol, however, can consider alcohol use disorder as a cause in the setting of wernicke's encephalopathy. Overall, needs a workup for organic cause of AMS but would also benefit from psych eval. - Consider EEG - Consider lumbar puncture  [ ]  consult neurology [ ]  consult psychiatry

## 2023-02-04 ENCOUNTER — Telehealth: Payer: Self-pay | Admitting: Neurology

## 2023-02-04 NOTE — Telephone Encounter (Signed)
Pt's Johnny [name], Want to confirm appt is still needed. Does he need to see someone else, has a new diagnosed for dementia. Would like a call back.

## 2023-02-04 NOTE — Telephone Encounter (Signed)
Return call to sheryl who stated that Dr. Shelva Majestic diagnosed pt with Dementia with Lewy bodies and does he really need to see someone here or have them manage or what is the process. I told them to keep appt. Routing to slack np

## 2023-02-06 ENCOUNTER — Ambulatory Visit: Payer: Medicare Other | Admitting: Neurology

## 2023-02-06 ENCOUNTER — Encounter: Payer: Self-pay | Admitting: Neurology

## 2023-02-06 ENCOUNTER — Other Ambulatory Visit (HOSPITAL_BASED_OUTPATIENT_CLINIC_OR_DEPARTMENT_OTHER): Payer: Self-pay

## 2023-02-06 VITALS — BP 166/97 | HR 60 | Ht 72.0 in | Wt 174.5 lb

## 2023-02-06 DIAGNOSIS — F03A4 Unspecified dementia, mild, with anxiety: Secondary | ICD-10-CM | POA: Diagnosis not present

## 2023-02-06 DIAGNOSIS — G3183 Dementia with Lewy bodies: Secondary | ICD-10-CM

## 2023-02-06 DIAGNOSIS — F02B11 Dementia in other diseases classified elsewhere, moderate, with agitation: Secondary | ICD-10-CM | POA: Diagnosis not present

## 2023-02-06 MED ORDER — GABAPENTIN 300 MG PO CAPS: 300.0000 mg | ORAL_CAPSULE | Freq: Every day | ORAL | 5 refills | Status: DC

## 2023-02-06 NOTE — Progress Notes (Signed)
Chief Complaint  Patient presents with   Memory Loss    RM12, WIFE Johnny PRESENT, MEMORY LOSS:MOCA WAS UNABLE TO COMPLETE VIA WIFE SAYS MAKES HIM NERVOUS, RECENTLY DIAGNOSED W/LEWY BODY DEMENTIA, HAS HALLUCINATIONS, WORD FINDING DIFFICULTY, LINE OF VISION PROBLEM BEING IN 2 PLACES AT ONE TIME, DEPTH PERCEPTION ISSUES, SEEING MULTIPLES   ASSESSMENT AND PLAN  Johnny [name] is a 72 y.o. male    1.  Lewy body dementia 2.  ADD 3.  Anxiety, stress 4.  Chronic migraine headaches 5.  Episode of worsening confusion, word finding difficulties September 06, 2021  -Consult with Johnny [name] in August 2024 felt consistent with Lewy body dementia -Vitamin D low at 14, vitamin B12 low normal, recheck today will supplement if necessary -Continue seeing psychiatry, they have recommended Aricept, currently on hold, wife feels like things are under control for now -Previously tried and failed: Trazodone, Seroquel, Haldol -Continue Fioricet as needed for acute migraine -I refilled gabapentin 300 mg at bedtime to help with sleep, also back pain -Follow-up in 6 months or sooner if needed  DIAGNOSTIC DATA (LABS, IMAGING, TESTING) - I reviewed patient records, labs, notes, testing and imaging myself where available.  In 2023: B12 472, normal BMP, TSH, RPR, HIV, LDL 97.  MEDICAL HISTORY:  Johnny [name] 72 year old male,, seen in request by vascular neurologist Johnny [name], Scheryl [name], for complicated migraine headaches, his primary care physician is Dr. Marlan [name]   I reviewed and summarized the referring note. PMHx. HLD HTN CAD, on plavix Chronic low back pain,  B12 deficiency.  I saw him in 2016 for migraine with aura changes, he presented with right visual field change, that is accompanied by mild lateralized headache  MRI of the brain at that time showed mild small vessel disease,  He does report long history of chronic migraine headaches, often preceded by right visual field change, but  only happens rarely  On September 06, 2021, shortly after he woke up while talking his wife, he was found to be confused, difficulty getting his words out, also has mild headaches lasting for about 2 hours, but this time he did not have his usual visual changes that overall accompanied his migraine headache  He is a retired IT sales professional, reported excessive stress at home, wife suffered MS, and there are 7 other family members living with them over the past few months, which has caused a lot of anxiety on him, he often feels overwhelmed  His mother does suffer dementia, he was noted to have slow worsening memory loss over the past few years, word finding difficulties, which has made worse by his current stress, he has difficulty sleeping, today's MoCA examination is only 16/30  He has history of chronic artery disease, taking Plavix 75 mg, will be on hold for his epidural injection for low back pain, and colonoscopy  EEG on September 06, 2021 was normal  I personally reviewed MRI of the brain September 06, 2021, no acute abnormality, generalized atrophy, mild small vessel disease, progress since 2016  Laboratory evaluations showed normal or negative B12, troponin, BMP, TSH, RPR, B1, HIV, CBC, lipid panel LDL 97  Update July 31, 2022 SS: Here with his wife. No further events of confusion or speech difficulty since April 2023. Has about 2-3 migraines a month. He takes Geophysicist/field seismologist, it works well. Takes Trazodone 50 mg as needed, makes him tired the next day. His ADHD is not treated. On Plavix 75 mg daily. Chronic pain on hydrocodone from pain  management. Needs hearing evaluated. He is active, drives a car, he walks about an hour daily. Has 6 grand kids and pets staying at their home. Still under a lot of stress. During moca seems to over think testing, anxious.  MOCA 14/30.  Update February 06, 2023 SS: In the ER July 2024 for paranoia, aggressive behavior episodes.  Cleared by psychiatry, started Haldol.  MRI brain  was stable with chronic small vessel ischemia and volume loss, chronic petechiae in the right occipital lobe.  B12 255, TSH 0.348, Vitamin D 14, few days later admitted at [facility] for AMS, hallucinations, agitation.  Placed on Seroquel.  UDS positive for opiates, THC. [facility] felt consistent with FTD. On 8/8 had neuropsychological consult with Johnny [name].  Felt consistent with Lewy body dementia.  Sustained sleep deprivation, stress at home likely contributing to acute exacerbation. Is going to psychiatry, recommended Aricept, is sensitive to medications. Right now has things under control. Continues with visual hallucinations, but he is aware not real. Sleeping is pretty good, sleep is key. Pain management felt need to continue Norco. He takes CBD gummy to health with agitation, along with melatonin. Is off trazodone. Calming music helps. Able to do ADLs. Stay with their son right now, a lot of family members at home. When he get a good night's sleep he has clarity. Sleep is more restful, he does dream, has about 2-3 migraines a month, has Fioricet, but has been afraid to try tell.  Has hearing aids now. His voice is soft. Is not driving,   PHYSICAL EXAM:   Vitals:   02/06/23 1402  BP: (!) 157/83  Pulse: 60  Weight: 174 lb 8 oz (79.2 kg)  Height: 6' (1.829 m)    Body mass index is 23.67 kg/m.  Physical Exam  General: The patient is alert and cooperative at the time of the examination.  Well-dressed, groomed.  He is fidgety.  Tired looking.  Skin: No significant peripheral edema is noted.  Neurologic Exam  Mental status: The patient is alert, most history is provided by his wife.  He has slowed processing and slow response time.  Has some trouble with exam commands.    Cranial nerves: Facial symmetry is present. Speech is soft.  Extraocular movements are full. Visual fields are full.  Motor: The patient has good strength in all 4 extremities.  Sensory examination: Soft touch sensation is  symmetric on the face, arms, and legs.  Coordination: The patient has good finger-nose-finger and heel-to-shin bilaterally.  There is moderate apraxia with exam commands.  Gait and station: The patient has a normal gait, forward leaning but independent.  Reflexes: Deep tendon reflexes are symmetric.     07/31/2022    4:30 PM 10/19/2021   11:00 AM  Montreal Cognitive Assessment   Visuospatial/ Executive (0/5) 1 1  Naming (0/3) 3 3  Attention: Read list of digits (0/2) 1 2  Attention: Read list of letters (0/1) 1 0  Attention: Serial 7 subtraction starting at 100 (0/3) 1 1  Language: Repeat phrase (0/2) 2 1  Language : Fluency (0/1) 1 1  Abstraction (0/2) 0 1  Delayed Recall (0/5) 0 0  Orientation (0/6) 4 6  Total 14 16     REVIEW OF SYSTEMS:  Full 14 system review of systems performed and notable only for as above All other review of systems were negative.   ALLERGIES: Allergies  Allergen Reactions   Lipitor [Atorvastatin] Other (See Comments)    Myalgia  Praluent [Alirocumab] Other (See Comments)    Muscle pain and flu like symptoms   Crestor [Rosuvastatin] Other (See Comments)    Myalgia   Imdur [Isosorbide Nitrate] Other (See Comments)    Severe Headaches   Morphine     Other Reaction(s): Vomiting   Hydrocodone Nausea Only    Can take with food   Levaquin [Levofloxacin] Nausea And Vomiting   Metadate Cd [Methylphenidate Hcl] Other (See Comments)    Burning, GI upset, bloating   Morphine And Codeine Nausea And Vomiting   Pravachol [Pravastatin] Other (See Comments)    myalgia    HOME MEDICATIONS: Current Outpatient Medications  Medication Sig Dispense Refill   butalbital-acetaminophen-caffeine (FIORICET) 50-325-40 MG tablet Take 1 tablet by mouth every 6 (six) hours as needed for headache. 12 tablet 5   gabapentin (NEURONTIN) 300 MG capsule Take 300 mg by mouth at bedtime.     HYDROcodone-acetaminophen (NORCO) 10-325 MG tablet Take 1 tablet by mouth every 4  hours as needed for pain. 180 tablet 0   Melatonin 5 MG CAPS Take 4 capsules by mouth at bedtime.     nitroGLYCERIN (NITROSTAT) 0.4 MG SL tablet Place 1 tablet (0.4 mg total) under the tongue every 5 (five) minutes as needed for chest pain. 25 tablet 1   sucralfate (CARAFATE) 1 g tablet Take 1 g by mouth 2 (two) times daily as needed. Stomache     vitamin B-12 (CYANOCOBALAMIN) 500 MCG tablet Take 1 tablet (500 mcg total) by mouth daily. 30 tablet 3   No current facility-administered medications for this visit.    PAST MEDICAL HISTORY: Past Medical History:  Diagnosis Date   Adenomatous polyps    Bronchitis    CAD in native artery 2017   Chronic back pain    GERD (gastroesophageal reflux disease)    Hyperlipidemia    Hypertension    Internal hemorrhoids    Low testosterone    Myocardial infarction (HCC)    Osteopenia    Shortness of breath dyspnea    Spondylosis    TIA (transient ischemic attack) 2008   Ureterolithiasis    Ventral hernia    Vitamin D deficiency     PAST SURGICAL HISTORY: Past Surgical History:  Procedure Laterality Date   BACK SURGERY     2 surgeries   CARDIAC CATHETERIZATION N/A 09/12/2015   Procedure: Left Heart Cath and Coronary Angiography;  Surgeon: Runell Gess, MD;  Location: [facility] INVASIVE CV LAB;  Service: Cardiovascular;  Laterality: N/A;   CORONARY STENT INTERVENTION N/A 06/23/2019   Procedure: CORONARY STENT INTERVENTION;  Surgeon: Tonny Bollman, MD;  Location: [facility] INVASIVE CV LAB;  Service: Cardiovascular;  Laterality: N/A;   LEFT HEART CATH AND CORONARY ANGIOGRAPHY N/A 01/27/2018   Procedure: LEFT HEART CATH AND CORONARY ANGIOGRAPHY;  Surgeon: Corky Crafts, MD;  Location: [facility] INVASIVE CV LAB;  Service: Cardiovascular;  Laterality: N/A;   LEFT HEART CATH AND CORONARY ANGIOGRAPHY N/A 06/23/2019   Procedure: LEFT HEART CATH AND CORONARY ANGIOGRAPHY;  Surgeon: Tonny Bollman, MD;  Location: [facility] INVASIVE CV LAB;  Service: Cardiovascular;   Laterality: N/A;   LEFT HEART CATH AND CORONARY ANGIOGRAPHY N/A 06/24/2020   Procedure: LEFT HEART CATH AND CORONARY ANGIOGRAPHY;  Surgeon: Lennette Bihari, MD;  Location: [facility] INVASIVE CV LAB;  Service: Cardiovascular;  Laterality: N/A;    FAMILY HISTORY: Family History  Problem Relation Age of Onset   Heart disease Mother    Stroke Mother    Heart attack Mother  Emphysema Father     SOCIAL HISTORY: Social History   Socioeconomic History   Marital status: Married    Spouse name: Not on file   Number of children: 4   Years of education: 14   Highest education level: Not on file  Occupational History   Occupation: Transfer driver  Tobacco Use   Smoking status: Former    Current packs/day: 0.00    Types: Cigarettes    Start date: 01/05/1968    Quit date: 01/04/1978    Years since quitting: 45.1   Smokeless tobacco: Never  Vaping Use   Vaping status: Never Used  Substance and Sexual Activity   Alcohol use: No    Alcohol/week: 0.0 standard drinks of alcohol    Comment: rarely   Drug use: No   Sexual activity: Not on file  Other Topics Concern   Not on file  Social History Narrative   Lives at home with his wife.   Right-handed.   2 cups caffeine per day.      Social history: He is married.  He had a son involved in a serious motor vehicle accident which took over a year to recover.  His wife has multiple sclerosis and is disabled.  He is a retired Company secretary.  He does not smoke.  Very occasional alcohol consumption.  2 other adult children.       Social Determinants of Health   Financial Resource Strain: Low Risk  (12/21/2022)   Received from [facility]   Overall Financial Resource Strain (CARDIA)    Difficulty of Paying Living Expenses: Not hard at all  Food Insecurity: No Food Insecurity (12/21/2022)   Received from [facility]   Hunger Vital Sign    Worried About Running Out of Food in the Last Year: Never true    Ran Out of Food  in the Last Year: Never true  Transportation Needs: No Transportation Needs (12/21/2022)   Received from [facility] - Transportation    In the past 12 months, has lack of transportation kept you from medical appointments or from getting medications?: No    Lack of Transportation (Non-Medical): No  Physical Activity: Not on file  Stress: Not on file  Social Connections: Unknown (10/03/2021)   Received from [facility]   Social Network    Social Network: Not on file  Intimate Partner Violence: Unknown (08/25/2021)   Received from [facility]   HITS    Physically Hurt: Not on file    Insult or Talk Down To: Not on file    Threaten Physical Harm: Not on file    Scream or Curse: Not on file    Otila Kluver, DNP  [facility] [address] [place], [place] [address] [phone]

## 2023-02-06 NOTE — Patient Instructions (Signed)
Will check vit d , b 12 Continue to see psychiatry   Meds ordered this encounter  Medications   gabapentin (NEURONTIN) 300 MG capsule    Sig: Take 1 capsule (300 mg total) by mouth at bedtime.    Dispense:  30 capsule    Refill:  5

## 2023-02-07 LAB — VITAMIN D 25 HYDROXY (VIT D DEFICIENCY, FRACTURES): Vit D, 25-Hydroxy: 24.7 ng/mL — ABNORMAL LOW (ref 30.0–100.0)

## 2023-02-07 LAB — VITAMIN B12: Vitamin B-12: 403 pg/mL (ref 232–1245)

## 2023-05-02 ENCOUNTER — Telehealth: Payer: Self-pay | Admitting: Internal Medicine

## 2023-05-02 NOTE — Telephone Encounter (Signed)
Called and spoke with Johnny [name] to let her know we need an appointment before the end of the year for a blood pressure check. She is going to check schedule and send a message thru MyChart.

## 2023-05-06 NOTE — Telephone Encounter (Signed)
LVM to CB and schedule an appointment before the end of the year for a blood pressure check.

## 2023-05-07 NOTE — Telephone Encounter (Addendum)
LVM to CB and schedule office visit for Blood pressure check. Last time at [facility] blood pressure was high. This needs to be done before the end of the year.

## 2023-05-07 NOTE — Telephone Encounter (Signed)
Wife called back and schedule an appointment

## 2023-05-10 NOTE — Progress Notes (Shared)
Patient Care Team: Margaree Mackintosh, MD as PCP - General (Internal Medicine) Wendall Stade, MD as PCP - Cardiology (Cardiology) Collene Gobble, MD as Consulting Physician (Family Medicine) Micki Riley, MD as Consulting Physician (Neurology) Karie Soda, MD as Consulting Physician (General Surgery) Charlott Rakes, MD as Consulting Physician (Gastroenterology)  Visit Date: 05/10/23  Subjective:    Patient ID: Johnny [name] , Male   DOB: 1951-03-25, 72 y.o.    MRN: [id number]   72 y.o. Male presents today for elevated blood pressure. History of hypertension and currently not taking any medication. Reports blood pressure measured at home    Past Medical History:  Diagnosis Date   Adenomatous polyps    Bronchitis    CAD in native artery 2017   Chronic back pain    GERD (gastroesophageal reflux disease)    Hyperlipidemia    Hypertension    Internal hemorrhoids    Low testosterone    Myocardial infarction (HCC)    Osteopenia    Shortness of breath dyspnea    Spondylosis    TIA (transient ischemic attack) 2008   Ureterolithiasis    Ventral hernia    Vitamin D deficiency      Family History  Problem Relation Age of Onset   Heart disease Mother    Stroke Mother    Heart attack Mother    Emphysema Father     Social History   Social History Narrative   Lives at home with his wife.   Right-handed.   2 cups caffeine per day.      Social history: He is married.  He had a son involved in a serious motor vehicle accident which took over a year to recover.  His wife has multiple sclerosis and is disabled.  He is a retired Company secretary.  He does not smoke.  Very occasional alcohol consumption.  2 other adult children.          ROS      Objective:   Vitals: There were no vitals taken for this visit.   Physical Exam    Results:   Studies obtained and personally reviewed by me:  Imaging, colonoscopy, mammogram, bone density scan, echocardiogram, heart  cath, stress test, CT calcium score, etc. ***   Labs:       Component Value Date/Time   NA 140 12/17/2022 1850   NA 135 07/26/2020 1207   K 3.8 12/17/2022 1850   CL 106 12/17/2022 1850   CO2 27 12/17/2022 1850   GLUCOSE 84 12/17/2022 1850   BUN 17 12/17/2022 1850   BUN 13 07/26/2020 1207   CREATININE 1.12 12/17/2022 1850   CREATININE 1.12 07/09/2022 0909   CALCIUM 9.5 12/17/2022 1850   PROT 6.4 (L) 12/17/2022 1850   PROT 6.4 01/19/2020 0808   ALBUMIN 4.0 12/17/2022 1850   ALBUMIN 4.5 01/19/2020 0808   AST 12 (L) 12/17/2022 1850   ALT 11 12/17/2022 1850   ALKPHOS 42 12/17/2022 1850   BILITOT 0.7 12/17/2022 1850   BILITOT 0.5 01/19/2020 0808   GFRNONAA >60 12/17/2022 1850   GFRNONAA 75 06/14/2020 0911   GFRAA 87 06/14/2020 0911     Lab Results  Component Value Date   WBC 6.9 12/17/2022   HGB 12.6 (L) 12/17/2022   HCT 37.4 (L) 12/17/2022   MCV 101.4 (H) 12/17/2022   PLT 201 12/17/2022    Lab Results  Component Value Date   CHOL 177 10/18/2022   HDL 42 10/18/2022  LDLCALC 98 10/18/2022   TRIG 258 (H) 10/18/2022   CHOLHDL 4.2 10/18/2022    Lab Results  Component Value Date   HGBA1C 5.1 07/09/2022     Lab Results  Component Value Date   TSH 0.348 (L) 12/18/2022     Lab Results  Component Value Date   PSA 0.63 07/09/2022   PSA 0.69 07/07/2021   PSA 0.57 06/14/2020   *** delete for male pts  ***    Assessment & Plan:   ***    I,Alexander Ruley,acting as a scribe for Margaree Mackintosh, MD.,have documented all relevant documentation on the behalf of Margaree Mackintosh, MD,as directed by  Margaree Mackintosh, MD while in the presence of Margaree Mackintosh, MD.   ***

## 2023-05-11 ENCOUNTER — Encounter (HOSPITAL_COMMUNITY): Payer: Self-pay

## 2023-05-11 ENCOUNTER — Other Ambulatory Visit: Payer: Self-pay

## 2023-05-11 ENCOUNTER — Emergency Department (HOSPITAL_COMMUNITY)
Admission: EM | Admit: 2023-05-11 | Discharge: 2023-05-12 | Disposition: A | Discharge status: Home / Self Care | Payer: Medicare Other | Attending: Emergency Medicine | Admitting: Emergency Medicine

## 2023-05-11 DIAGNOSIS — E785 Hyperlipidemia, unspecified: Secondary | ICD-10-CM | POA: Diagnosis not present

## 2023-05-11 DIAGNOSIS — F22 Delusional disorders: Secondary | ICD-10-CM | POA: Diagnosis not present

## 2023-05-11 DIAGNOSIS — Z9181 History of falling: Secondary | ICD-10-CM | POA: Insufficient documentation

## 2023-05-11 DIAGNOSIS — I10 Essential (primary) hypertension: Secondary | ICD-10-CM | POA: Insufficient documentation

## 2023-05-11 DIAGNOSIS — F02811 Dementia in other diseases classified elsewhere, unspecified severity, with agitation: Secondary | ICD-10-CM | POA: Insufficient documentation

## 2023-05-11 DIAGNOSIS — Z79899 Other long term (current) drug therapy: Secondary | ICD-10-CM | POA: Insufficient documentation

## 2023-05-11 DIAGNOSIS — R4182 Altered mental status, unspecified: Secondary | ICD-10-CM | POA: Diagnosis not present

## 2023-05-11 DIAGNOSIS — F03911 Unspecified dementia, unspecified severity, with agitation: Secondary | ICD-10-CM | POA: Insufficient documentation

## 2023-05-11 DIAGNOSIS — R451 Restlessness and agitation: Secondary | ICD-10-CM | POA: Diagnosis present

## 2023-05-11 DIAGNOSIS — G3183 Dementia with Lewy bodies: Secondary | ICD-10-CM | POA: Insufficient documentation

## 2023-05-11 NOTE — ED Triage Notes (Addendum)
Wife reports the patient has major thoughts of confusion, agitation, and hallucinations. Wife states he has been taking his medication on a regular basis and it is getting worse. Patient is not saying much in triage. Patient appears confused in triage. Wife reports a fall a few weeks ago. He was not evaluated after the fall. No blood thinners. Hx of Lewy Body dementia.

## 2023-05-12 ENCOUNTER — Emergency Department (HOSPITAL_COMMUNITY): Payer: Medicare Other

## 2023-05-12 ENCOUNTER — Encounter (HOSPITAL_COMMUNITY): Payer: Self-pay | Admitting: Emergency Medicine

## 2023-05-12 ENCOUNTER — Emergency Department (HOSPITAL_COMMUNITY)
Admission: EM | Admit: 2023-05-12 | Discharge: 2023-05-13 | Disposition: A | Discharge status: Home / Self Care | Payer: Medicare Other | Attending: Emergency Medicine | Admitting: Emergency Medicine

## 2023-05-12 DIAGNOSIS — F03918 Unspecified dementia, unspecified severity, with other behavioral disturbance: Secondary | ICD-10-CM | POA: Insufficient documentation

## 2023-05-12 DIAGNOSIS — I10 Essential (primary) hypertension: Secondary | ICD-10-CM | POA: Insufficient documentation

## 2023-05-12 LAB — CBC
HCT: 43.8 % (ref 39.0–52.0)
Hemoglobin: 14.5 g/dL (ref 13.0–17.0)
MCH: 33 pg (ref 26.0–34.0)
MCHC: 33.1 g/dL (ref 30.0–36.0)
MCV: 99.8 fL (ref 80.0–100.0)
Platelets: 220 10*3/uL (ref 150–400)
RBC: 4.39 MIL/uL (ref 4.22–5.81)
RDW: 11.7 % (ref 11.5–15.5)
WBC: 8.3 10*3/uL (ref 4.0–10.5)
nRBC: 0 % (ref 0.0–0.2)

## 2023-05-12 LAB — CBC WITH DIFFERENTIAL/PLATELET
Abs Immature Granulocytes: 0.02 10*3/uL (ref 0.00–0.07)
Basophils Absolute: 0 10*3/uL (ref 0.0–0.1)
Basophils Relative: 1 %
Eosinophils Absolute: 0 10*3/uL (ref 0.0–0.5)
Eosinophils Relative: 1 %
HCT: 44.2 % (ref 39.0–52.0)
Hemoglobin: 14.9 g/dL (ref 13.0–17.0)
Immature Granulocytes: 0 %
Lymphocytes Relative: 22 %
Lymphs Abs: 1.8 10*3/uL (ref 0.7–4.0)
MCH: 33.6 pg (ref 26.0–34.0)
MCHC: 33.7 g/dL (ref 30.0–36.0)
MCV: 99.5 fL (ref 80.0–100.0)
Monocytes Absolute: 0.6 10*3/uL (ref 0.1–1.0)
Monocytes Relative: 8 %
Neutro Abs: 5.5 10*3/uL (ref 1.7–7.7)
Neutrophils Relative %: 68 %
Platelets: 216 10*3/uL (ref 150–400)
RBC: 4.44 MIL/uL (ref 4.22–5.81)
RDW: 11.9 % (ref 11.5–15.5)
WBC: 8 10*3/uL (ref 4.0–10.5)
nRBC: 0 % (ref 0.0–0.2)

## 2023-05-12 LAB — COMPREHENSIVE METABOLIC PANEL
ALT: 10 U/L (ref 0–44)
ALT: 13 U/L (ref 0–44)
AST: 14 U/L — ABNORMAL LOW (ref 15–41)
AST: 15 U/L (ref 15–41)
Albumin: 4.1 g/dL (ref 3.5–5.0)
Albumin: 4.2 g/dL (ref 3.5–5.0)
Alkaline Phosphatase: 49 U/L (ref 38–126)
Alkaline Phosphatase: 52 U/L (ref 38–126)
Anion gap: 10 (ref 5–15)
Anion gap: 7 (ref 5–15)
BUN: 13 mg/dL (ref 8–23)
BUN: 16 mg/dL (ref 8–23)
CO2: 22 mmol/L (ref 22–32)
CO2: 26 mmol/L (ref 22–32)
Calcium: 10.1 mg/dL (ref 8.9–10.3)
Calcium: 10.2 mg/dL (ref 8.9–10.3)
Chloride: 104 mmol/L (ref 98–111)
Chloride: 105 mmol/L (ref 98–111)
Creatinine, Ser: 0.94 mg/dL (ref 0.61–1.24)
Creatinine, Ser: 1.02 mg/dL (ref 0.61–1.24)
GFR, Estimated: >60 mL/min (ref >60)
GFR, Estimated: >60 mL/min (ref >60)
Glucose, Bld: 87 mg/dL (ref 70–99)
Glucose, Bld: 88 mg/dL (ref 70–99)
Potassium: 3.7 mmol/L (ref 3.5–5.1)
Potassium: 4.3 mmol/L (ref 3.5–5.1)
Sodium: 137 mmol/L (ref 135–145)
Sodium: 137 mmol/L (ref 135–145)
Total Bilirubin: 1.3 mg/dL — ABNORMAL HIGH (ref <1.2)
Total Bilirubin: 1.7 mg/dL — ABNORMAL HIGH (ref <1.2)
Total Protein: 7.1 g/dL (ref 6.5–8.1)
Total Protein: 7.2 g/dL (ref 6.5–8.1)

## 2023-05-12 LAB — RAPID URINE DRUG SCREEN, HOSP PERFORMED
Amphetamines: NOT DETECTED
Barbiturates: POSITIVE — AB
Benzodiazepines: NOT DETECTED
Cocaine: NOT DETECTED
Opiates: POSITIVE — AB
Tetrahydrocannabinol: POSITIVE — AB

## 2023-05-12 LAB — URINALYSIS, ROUTINE W REFLEX MICROSCOPIC
Bilirubin Urine: NEGATIVE
Glucose, UA: NEGATIVE mg/dL
Ketones, ur: NEGATIVE mg/dL
Leukocytes,Ua: NEGATIVE
Nitrite: NEGATIVE
Protein, ur: NEGATIVE mg/dL
Specific Gravity, Urine: 1.023 (ref 1.005–1.030)
pH: 5 (ref 5.0–8.0)

## 2023-05-12 LAB — SALICYLATE LEVEL: Salicylate Lvl: <7 mg/dL — ABNORMAL LOW (ref 7.0–30.0)

## 2023-05-12 LAB — ETHANOL
Alcohol, Ethyl (B): <10 mg/dL (ref <10)
Alcohol, Ethyl (B): <10 mg/dL (ref <10)

## 2023-05-12 LAB — ACETAMINOPHEN LEVEL: Acetaminophen (Tylenol), Serum: 10 ug/mL (ref 10–30)

## 2023-05-12 MED ORDER — GABAPENTIN 300 MG PO CAPS
300.0000 mg | ORAL_CAPSULE | Freq: Every day | ORAL | Status: DC
  Administered 2023-05-12: 300 mg via ORAL
  Filled 2023-05-12: qty 1

## 2023-05-12 MED ORDER — HYDROXYZINE HCL 50 MG PO TABS
50.0000 mg | ORAL_TABLET | Freq: Every day | ORAL | Status: DC
  Administered 2023-05-12: 50 mg via ORAL
  Filled 2023-05-12 (×2): qty 1

## 2023-05-12 MED ORDER — QUETIAPINE FUMARATE 25 MG PO TABS: 12.5000 mg | ORAL_TABLET | Freq: Two times a day (BID) | ORAL | Status: DC | PRN

## 2023-05-12 MED ORDER — RISPERIDONE 1 MG PO TABS
0.5000 mg | ORAL_TABLET | Freq: Two times a day (BID) | ORAL | Status: DC | PRN
  Administered 2023-05-13: 0.5 mg via ORAL
  Filled 2023-05-12 (×2): qty 1

## 2023-05-12 MED ORDER — HYDROCODONE-ACETAMINOPHEN 10-325 MG PO TABS
1.0000 | ORAL_TABLET | ORAL | Status: DC | PRN
  Filled 2023-05-12 (×2): qty 1

## 2023-05-12 MED ORDER — BUTALBITAL-APAP-CAFFEINE 50-325-40 MG PO TABS: 1.0000 | ORAL_TABLET | Freq: Four times a day (QID) | ORAL | Status: DC | PRN

## 2023-05-12 MED ORDER — VITAMIN B-12 1000 MCG PO TABS
500.0000 ug | ORAL_TABLET | Freq: Every day | ORAL | Status: DC
  Administered 2023-05-12: 500 ug via ORAL
  Filled 2023-05-12: qty 1

## 2023-05-12 MED ORDER — MELATONIN 5 MG PO TABS
20.0000 mg | ORAL_TABLET | Freq: Every day | ORAL | Status: DC
  Administered 2023-05-12: 20 mg via ORAL
  Filled 2023-05-12: qty 4

## 2023-05-12 NOTE — ED Provider Notes (Signed)
[place] EMERGENCY DEPARTMENT AT [facility] Provider Note   CSN: [id number] Arrival date & time: 05/11/23  2310     History  Chief Complaint  Patient presents with   Hallucinations    Johnny [name] is a 72 y.o. male.  72 year old male with a history of hypertension, hyperlipidemia, Lewy body dementia presents to the emergency department for evaluation.  Much of the history is provided by the patient's wife, at bedside.  She states that he has been having increasing paranoia and agitation since Thanksgiving.  He did have his hydroxyzine increased to twice daily 2 days ago, but this has not significantly changed the patient's symptoms.  Reports that the patient vividly can recall aspects of an alternate reality, visualizes "doppelgangers" of her which can trigger increased agitation.  Wife reports a fall a few weeks ago; patient is not on chronic anticoagulants.  No recent fevers or infectious symptoms.  She states no long term goals of care have been discussed with her by the patient's existing providers/specialists.  The history is provided by the patient and the spouse. No language interpreter was used.       Home Medications Prior to Admission medications   Medication Sig Start Date End Date Taking? Authorizing Provider  butalbital-acetaminophen-caffeine (FIORICET) 50-325-40 MG tablet Take 1 tablet by mouth every 6 (six) hours as needed for headache. 07/31/22  Yes Glean Salvo, NP  gabapentin (NEURONTIN) 300 MG capsule Take 1 capsule (300 mg total) by mouth at bedtime. 02/06/23  Yes Glean Salvo, NP  HYDROcodone-acetaminophen (NORCO) 10-325 MG tablet Take 1 tablet by mouth every 4 hours as needed for pain. 11/02/22  Yes   hydrOXYzine (VISTARIL) 25 MG capsule Take 25 mg by mouth 2 (two) times daily as needed for anxiety. 05/01/23  Yes [provider]  Melatonin 5 MG CAPS Take 4 capsules by mouth at bedtime.   Yes [provider]  nitroGLYCERIN  (NITROSTAT) 0.4 MG SL tablet Place 1 tablet (0.4 mg total) under the tongue every 5 (five) minutes as needed for chest pain. 11/03/21  Yes Weaver, Scott T, PA-C  sucralfate (CARAFATE) 1 g tablet Take 1 g by mouth 2 (two) times daily as needed. [place] 03/07/22  Yes [provider]  vitamin B-12 (CYANOCOBALAMIN) 500 MCG tablet Take 1 tablet (500 mcg total) by mouth daily. 09/07/21  Yes Elgergawy, Leana Roe, MD      Allergies    Lipitor [atorvastatin], Praluent [alirocumab], Crestor [rosuvastatin], Imdur [isosorbide nitrate], Morphine, Hydrocodone, Levaquin [levofloxacin], Metadate cd [methylphenidate hcl], Morphine and codeine, and Pravachol [pravastatin]    Review of Systems   Review of Systems Ten systems reviewed and are negative for acute change, except as noted in the HPI.    Physical Exam Updated Vital Signs BP (!) 143/97 (BP Location: Left Arm)   Pulse 63   Temp 98.4 F (36.9 C) (Oral)   Resp 15   SpO2 98%   Physical Exam Vitals and nursing note reviewed.  Constitutional:      General: He is not in acute distress.    Appearance: He is well-developed. He is not diaphoretic.     Comments: Nontoxic appearing and in NAD  HENT:     Head: Normocephalic and atraumatic.  Eyes:     General: No scleral icterus.    Conjunctiva/sclera: Conjunctivae normal.  Pulmonary:     Effort: Pulmonary effort is normal. No respiratory distress.  Musculoskeletal:        General: Normal range  of motion.     Cervical back: Normal range of motion.  Skin:    General: Skin is warm and dry.     Coloration: Skin is not pale.     Findings: No erythema or rash.  Neurological:     Mental Status: He is alert and oriented to person, place, and time.     Comments: Moving all extremities spontaneously.  Psychiatric:        Attention and Perception: He is inattentive.        Mood and Affect: Affect is flat.        Speech: Speech is delayed.        Behavior: Behavior is withdrawn.     ED  Results / Procedures / Treatments   Labs (all labs ordered are listed, but only abnormal results are displayed) Labs Reviewed  COMPREHENSIVE METABOLIC PANEL - Abnormal; Notable for the following components:      Result Value   Total Bilirubin 1.3 (*)    All other components within normal limits  SALICYLATE LEVEL - Abnormal; Notable for the following components:   Salicylate Lvl <7.0 (*)    All other components within normal limits  URINALYSIS, ROUTINE W REFLEX MICROSCOPIC - Abnormal; Notable for the following components:   Hgb urine dipstick SMALL (*)    Bacteria, UA RARE (*)    All other components within normal limits  ETHANOL  ACETAMINOPHEN LEVEL  CBC  RAPID URINE DRUG SCREEN, HOSP PERFORMED    EKG None  Radiology CT HEAD WO CONTRAST ( ) Result Date: 05/12/2023 CLINICAL DATA:  Mental status change, unknown cause. Hallucinations. EXAM: CT HEAD WITHOUT CONTRAST TECHNIQUE: Contiguous axial images were obtained from the base of the skull through the vertex without intravenous contrast. RADIATION DOSE REDUCTION: This exam was performed according to the departmental dose-optimization program which includes automated exposure control, adjustment of the mA and/or kV according to patient size and/or use of iterative reconstruction technique. COMPARISON:  12/17/2022 FINDINGS: Brain: There is atrophy and chronic small vessel disease changes. No acute intracranial abnormality. Specifically, no hemorrhage, hydrocephalus, mass lesion, acute infarction, or significant intracranial injury. Vascular: No hyperdense vessel or unexpected calcification. Skull: No acute calvarial abnormality. Sinuses/Orbits: No acute findings Other: None IMPRESSION: Atrophy, chronic microvascular disease. No acute intracranial abnormality. Electronically Signed   By: Charlett Nose M.D.   On: 05/12/2023 00:44    Procedures Procedures    Medications Ordered in ED Medications - No data to display  ED Course/ Medical  Decision Making/ A&P Clinical Course as of 05/12/23 0359  Sun May 12, 2023  0100 Labs and head CT reassuring, stable. UA pending. Despite this, consultation placed to TTS for evaluation. [KH]  0216 Per TTS NP, Johnny [name] has cleared this patient from needing further Psych treatment and recommends that he follow up with his neurologist and PCP [KH]  [phone] Order placed for home health/face-to-face. Wife with no home support, no plan for cognitive decline associated with patient's dementia.  [KH]  0344 UA negative.  Had lengthy discussion with wife and son about next steps. Reiterated face-to-face order which has been placed. Offered to circumvent TTS and proceed with psychiatric assessment for medication recommendations ([facility] telehealth). Family is comfortable referring to their prior psychiatric providers for any medication adjustments. Also offered TOC consultation in the AM, but did stress that this could result in patient boarding in the department through the holiday which could serve to worsen his agitation and paranoia. Family comfortable with discharge and attempted outpatient  approach. Did reassure family that patient could return for repeat evaluation should circumstances change pending established plan. [KH]    Clinical Course User Index [KH] Antony Madura, PA-C                                 Medical Decision Making Amount and/or Complexity of Data Reviewed Labs: ordered. Radiology: ordered.   This patient presents to the ED for concern of agitation and paranoia, this involves an extensive number of treatment options, and is a complaint that carries with it a high risk of complications and morbidity.  The differential diagnosis includes dementia progression vs schizophrenia vs medication reaction vs toxicity vs ICH/CVA.   Co morbidities that complicate the patient evaluation  HTN HLD LBD   Additional history obtained:  Additional history obtained from wife and son, at  bedside External records from outside source obtained and reviewed including neuropsychological consultation on 12/27/22.   Lab Tests:  I Ordered, and personally interpreted labs.  The pertinent results include:  CBC, CMP, and UA reassuring; WNL. Negative ASA and APAP levels.   Imaging Studies ordered:  I ordered imaging studies including CT head  I independently visualized and interpreted imaging which showed no acute change; stable chronic atrophy I agree with the radiologist interpretation   Cardiac Monitoring:  The patient was maintained on a cardiac monitor.  I personally viewed and interpreted the cardiac monitored which showed an underlying rhythm of: NSR   Medicines ordered and prescription drug management:  I have reviewed the patients home medicines and have made adjustments as needed   Test Considered:  MRI brain   Consultations Obtained:  I requested consultation with TTS and discussed lab and imaging findings as well as pertinent plan - they recommend: continued outpatient follow up with primary care and neurology   Problem List / ED Course:  As above   Reevaluation:  After the interventions noted above, I reevaluated the patient and found that they have :stayed the same   Social Determinants of Health:  Dependent upon spouse Good social support   Dispostion:  After consideration of the diagnostic results and the patients response to treatment, I feel that the patent would benefit from continued f/u with outpatient providers. Encouraged adherence to current prescribed medications. Low threshold for return for worsening symptoms. Return precautions discussed and provided. Patient discharged in stable condition with no unaddressed concerns.          Final Clinical Impression(s) / ED Diagnoses Final diagnoses:  Agitation due to dementia [facility])    Rx / DC Orders ED Discharge Orders     None         Antony Madura, PA-C 05/12/23  0404    Gilda Crease, MD 05/12/23 [phone]

## 2023-05-12 NOTE — ED Provider Triage Note (Signed)
Emergency Medicine Provider Triage Evaluation Note  Johnny [name] , a 72 y.o. male  was evaluated in triage.  Pt complains of dementia.  Worsening dementia now becoming aggressive towards wife and is paranoid.  Was seen yesterday for same.  Likely need Geri psych  Review of Systems  Positive: As above Negative: As above  Physical Exam  BP (!) 171/107   Pulse 89   Temp 97.8 F (36.6 C) (Oral)   Ht 6' (1.829 m)   Wt 79.2 kg   SpO2 98%   BMI 23.68 kg/m  Gen:   Awake, no distress   Resp:  Normal effort  MSK:   Moves extremities without difficulty  Other:    Medical Decision Making  Medically screening exam initiated at 1:29 PM.  Appropriate orders placed.  [place] was informed that the remainder of the evaluation will be completed by another provider, this initial triage assessment does not replace that evaluation, and the importance of remaining in the ED until their evaluation is complete.     Fayrene Helper, PA-C 05/12/23 1330

## 2023-05-12 NOTE — Evaluation (Addendum)
Occupational Therapy Evaluation Patient Details Name: Johnny [name] MRN: [id number] DOB: 01-11-1951 Today's Date: 05/12/2023   History of Present Illness 72 y/o M presenting to ED on 12/22 with worsening agitation/confusion/hallucinations, noted to have a fall a few weeks ago. Seen in ED yesterday for similar and was advised to return if pt behavior worsened. CT head without acute abnormality. PMH includes HTN, HLD, LBD, lewy body dementia   Clinical Impression   PTA, pt using RW for mobility and having assist for ADLs per spouse. Pt lives with spouse at home to assist, but spouse reports it has become more difficult to manage/assist at home. Pt currently needing up to mod A for ADLs, CGA for bed mobility and CGA for transfers with RW. Pt needing mod cues throughout session for safety (pt letting go of RW) and for basic mobility/ADL tasks. Pt presenting with impairments listed below, will follow acutely. Patient will benefit from continued inpatient follow up therapy, <3 hours/day to maximize safety/ ind with ADL/functional mobility, however family is in the process of arranging in -home care, pt could return home with [facility] services if this is arranged and/or family feels comfortable/is able to provide level of assist needed at this time.        If plan is discharge home, recommend the following: A little help with walking and/or transfers;A lot of help with bathing/dressing/bathroom;Direct supervision/assist for medications management;Direct supervision/assist for financial management;Assist for transportation;Help with stairs or ramp for entrance    Functional Status Assessment  Patient has had a recent decline in their functional status and demonstrates the ability to make significant improvements in function in a reasonable and predictable amount of time.  Equipment Recommendations  Other (comment) (defer)    Recommendations for Other Services PT consult     Precautions /  Restrictions Precautions Precautions: Fall Restrictions Weight Bearing Restrictions Per Provider Order: No      Mobility Bed Mobility Overal bed mobility: Needs Assistance Bed Mobility: Supine to Sit, Sit to Supine     Supine to sit: Contact guard Sit to supine: Contact guard assist        Transfers Overall transfer level: Needs assistance Equipment used: Rolling walker (2 wheels) Transfers: Sit to/from Stand Sit to Stand: Contact guard assist                  Balance Overall balance assessment: Needs assistance Sitting-balance support: Feet supported Sitting balance-Leahy Scale: Good     Standing balance support: During functional activity, Reliant on assistive device for balance Standing balance-Leahy Scale: Poor                             ADL either performed or assessed with clinical judgement   ADL Overall ADL's : Needs assistance/impaired Eating/Feeding: Set up   Grooming: Minimal assistance   Upper Body Bathing: Minimal assistance   Lower Body Bathing: Minimal assistance   Upper Body Dressing : Minimal assistance   Lower Body Dressing: Moderate assistance Lower Body Dressing Details (indicate cue type and reason): when given demo, cued Toilet Transfer: Ambulation;Rolling walker (2 wheels);Contact guard assist   Toileting- Clothing Manipulation and Hygiene: Minimal assistance       Functional mobility during ADLs: Rolling walker (2 wheels);Contact guard assist       Vision   Vision Assessment?: No apparent visual deficits     Perception Perception: Not tested       Praxis Praxis: Not tested  Pertinent Vitals/Pain Pain Assessment Pain Assessment: Faces Pain Score: 3  Faces Pain Scale: Hurts little more Pain Location: pt reports "from chest down" Pain Descriptors / Indicators: Discomfort Pain Intervention(s): Limited activity within patient's tolerance, Monitored during session, Repositioned      Extremity/Trunk Assessment Upper Extremity Assessment Upper Extremity Assessment: Generalized weakness   Lower Extremity Assessment Lower Extremity Assessment: Generalized weakness   Cervical / Trunk Assessment Cervical / Trunk Assessment: Normal   Communication Communication Communication: No apparent difficulties   Cognition Arousal: Alert Behavior During Therapy: WFL for tasks assessed/performed Overall Cognitive Status: History of cognitive impairments - at baseline                                 General Comments: hx of lewy body dementia, needs step by step cues to perform basic mobility and ADL tasks, cues for safety throughout, oriented to self     General Comments  VSS    Exercises     Shoulder Instructions      Home Living Family/patient expects to be discharged to:: Private residence Living Arrangements: Spouse/significant other Available Help at Discharge: Family;Available 24 hours/day Type of Home: House Home Access: Ramped entrance     Home Layout: Two level     Bathroom Shower/Tub: Tub/shower unit         Home Equipment: Agricultural consultant (2 wheels);Hospital bed;BSC/3in1;Shower seat          Prior Functioning/Environment Prior Level of Function : Needs assist             Mobility Comments: RW ADLs Comments: assist for all ADLs        OT Problem List: Decreased strength;Decreased range of motion;Decreased activity tolerance;Impaired balance (sitting and/or standing);Decreased coordination;Decreased cognition;Decreased safety awareness      OT Treatment/Interventions: Self-care/ADL training;Therapeutic exercise;Energy conservation;DME and/or AE instruction;Therapeutic activities;Patient/family education;Balance training    OT Goals(Current goals can be found in the care plan section) Acute Rehab OT Goals Patient Stated Goal: none stated OT Goal Formulation: With patient Time For Goal Achievement: 05/26/23 Potential  to Achieve Goals: Good ADL Goals Pt Will Perform Upper Body Dressing: with modified independence;sitting Pt Will Perform Lower Body Dressing: with modified independence;sitting/lateral leans;sit to/from stand Pt Will Transfer to Toilet: with modified independence;ambulating;regular height toilet Pt Will Perform Tub/Shower Transfer: Tub transfer;Shower transfer;with modified independence;ambulating Additional ADL Goal #1: pt will follow 2 step command with min cues in prep for ADL  OT Frequency: Min 1X/week    Co-evaluation              AM-PAC OT "6 Clicks" Daily Activity     Outcome Measure Help from another person eating meals?: None Help from another person taking care of personal grooming?: A Little Help from another person toileting, which includes using toliet, bedpan, or urinal?: A Little Help from another person bathing (including washing, rinsing, drying)?: A Little Help from another person to put on and taking off regular upper body clothing?: A Little Help from another person to put on and taking off regular lower body clothing?: A Little 6 Click Score: 19   End of Session Equipment Utilized During Treatment: Gait belt;Rolling walker (2 wheels) Nurse Communication: Mobility status  Activity Tolerance: Patient tolerated treatment well Patient left: in bed;with call bell/phone within reach;with family/visitor present  OT Visit Diagnosis: Unsteadiness on feet (R26.81);Other abnormalities of gait and mobility (R26.89);Muscle weakness (generalized) (M62.81);History of falling (Z91.81);Other symptoms and signs involving  cognitive function                Time: 0272-5366 OT Time Calculation (min): 39 min Charges:  OT General Charges $OT Visit: 1 Visit OT Evaluation $OT Eval Moderate Complexity: 1 Mod OT Treatments $Self Care/Home Management : 8-22 mins $Therapeutic Activity: 8-22 mins  Carver Fila, OTD, OTR/L SecureChat Preferred Acute Rehab [phone]   Dalphine Handing 05/12/2023, 5:13 PM

## 2023-05-12 NOTE — BH Assessment (Signed)
Comprehensive Clinical Assessment (CCA) Note  05/12/2023 Johnny [name] [id number]  Chief Complaint:  Chief Complaint  Patient presents with   Hallucinations   Visit Diagnosis: Agitation due to dementia [facility])   DISPOSITION: Gave clinical report to [employer] who reports that pt is psych cleared and recommends that the patient follow up with Neurology and PCP.  ED Care Team - Dr. Jaci Carrel, PA-C Johnny [name] and Johnny Rondo, RN were notified of the disposition.   The patient demonstrates the following risk factors for suicide: Chronic risk factors for suicide include: N/A. Acute risk factors for suicide include: social withdrawal/isolation and Lewy Body Dementia Dx . Protective factors for this patient include: positive social support. Considering these factors, the overall suicide risk at this point appears to be low. Patient is appropriate for outpatient follow up.   72 year old male patient brought into the [facility] ED by his wife, Johnny [name] [phone] voluntarily. Wife completed the assessment due to pt having Lewy Body Dementia with increased confusion and hallucinations. Per Wife, the patient was diagnosed with Lewy body dementia in August 2024, which has been progressing/advancing rapidly. Per wife, pt is endorsing AVH, seeing people/ objects and hearing things. Pt is also endorsing paranoid delusions and seeing multiple objects at once, including multiple of her. Per wife, pt is oriented to self, has been accusatory and agitated when confused. Wife denied patient being a danger to self/others. Wife reports that pt is not physically aggressive towards himself or towards her. Wife denied alcohol/illicit substance abuse. Wife acknowledged that the pt has poor sleep and a fair appetite. Wife denied past MH diagnosis and Past Behavioral health hospitalizations.  Per Wife, the pt has a Neurology provider through [facility]- Johnny [name]. Wife reports that pt was last seen  in Sept/Oct. Per wife, pt is also seen by Johnny [name]. [place] through the [facility] health system for Primary Care and a referral sent by Neurology provider to [facility] Attention Specialist for psychiatry.   Pt is casually dressed and pleasantly confused. Pt is alert, oriented to self. Pt did not speak during this assessment, laughed and smiled. Eye contact was avoided. Thought process is delusional. Pt does not have good insight or judgement; both are impaired due to diagnosis of dementia. Pt is currently responding to internal stimuli and experiencing delusional thought content. Pt was pleasant but did not engage in the assessment. Wife would like the patient to undergo further testing to find out the cause of the rapid advancement. Per wife, ED care team completed scans, but results had not come back and wife also under the impression that ED care team was in the process of testing for infections.     CCA Screening, Triage and Referral (STR)  Patient Reported Information How did you hear about [place]? Family/Friend  What Is the Reason for Your Visit/Call Today? 72 year old male patient brought into the [facility] ED by his wife, Johnny [name] [phone] voluntarily. Wife completed the assessment due to pt having Lewey Body Dementia with increased confusion and hallucinations. Per Wife, the patient was diagnoses with Lewey body dementia in August 2024, which has been progressing/advancing. Per wife, pt is endorsing AVH, seeing people/ objects and hearing things. Pt is also endorsing paranoid delusions and seeing multiple objects at once, including multiple of her. Per wife, pt is oriented to self , has been accusatory and agitated when confused. Wife declined patient being a danger to self/others. Wife reports that pt is not physically agressive towards himself  or towards her. Wife denied alcohol/illicit substance abuse. Wife acknowledged that the pt has poor sleep and a fair appetitie. Wife declined past MH diagnosis and  Past Behavioral health hospitalizations.  How Long Has This Been Causing You Problems? 1-6 months  What Do You Feel Would Help You the Most Today? Medication(s) (Wife would like scans/test ran to learn if there are any further infections.)   Have You Recently Had Any Thoughts About Hurting Yourself? No  Are You Planning to Commit Suicide/Harm Yourself At This time? No   Flowsheet Row ED from 05/11/2023 in [facility] Emergency Department at [facility] ED from 12/17/2022 in [facility] Emergency Department at [facility] ED from 10/01/2022 in [facility] Emergency Department at [facility]  C-SSRS RISK CATEGORY No Risk No Risk No Risk       Have you Recently Had Thoughts About Hurting Someone Johnny [name]? No  Are You Planning to Harm Someone at This Time? No  Explanation: Pt unable to complete assessment. Assessment complete by his wife, Johnny [name] [phone]   Have You Used Any Alcohol or Drugs in the Past 24 Hours? NA What Did You Use and How Much? NA  Do You Currently Have a Therapist/Psychiatrist? yes Name of Therapist/Psychiatrist: Name of Therapist/Psychiatrist: [place] [facility]   Have You Been Recently Discharged From Any Office Practice or Programs? No  Explanation of Discharge From Practice/Program: N/a     CCA Screening Triage Referral Assessment Type of Contact: Tele-Assessment  Telemedicine Service Delivery: Telemedicine service delivery: This service was provided via telemedicine using a 2-way, interactive audio and video technology  Is this Initial or Reassessment? Is this Initial or Reassessment?: Initial Assessment  Date Telepsych consult ordered in [facility]:  Date Telepsych consult ordered in [facility]: 05/12/23  Time Telepsych consult ordered in [facility]:  Time Telepsych consult ordered in [facility]: 0101  Location of Assessment: [facility] ED  Provider Location: [facility] Assessment Services   Collateral Involvement: Saif Deso  [phone]   Does Patient Have a Court Appointed Legal Guardian? No  Legal Guardian Contact Information: N/a  Copy of Legal Guardianship Form: -- (N/a)  Legal Guardian Notified of Arrival: -- (N/a)  Legal Guardian Notified of Pending Discharge: -- (N/a)  If Minor and Not Living with Parent(s), Who has Custody? N/a  Is CPS involved or ever been involved? Never  Is APS involved or ever been involved? Never   Patient Determined To Be At Risk for Harm To Self or Others Based on Review of Patient Reported Information or Presenting Complaint? No  Method: No Plan  Availability of Means: No access or NA  Intent: Vague intent or NA  Notification Required: No need or identified person  Additional Information for Danger to Others Potential: -- (N/a)  Additional Comments for Danger to Others Potential: N/a  Are There Guns or Other Weapons in Your Home? No  Types of Guns/Weapons: Per Wife, there are no Guns/ Freight forwarder?                            -- (Per Wife, there are no Materials engineer)  Who Could Verify You Are Able To Have These Secured: Per Wife, there are no Guns/ Weapons  Do You Have any Outstanding Charges, Pending Court Dates, Parole/Probation? Wife denied.  Contacted To Inform of Risk of Harm To Self or Others: -- (N/a)    Does Patient Present under Involuntary Commitment?  No    [place] of Residence: [place]   Patient Currently Receiving the Following Services: Medication Management (Nuerology, Primary Care and Psychiatry)   Determination of Need: Emergent (2 hours)   Options For Referral: Medication Management; Geropsychiatric Facility; Other: Comment     CCA Biopsychosocial Patient Reported Schizophrenia/Schizoaffective Diagnosis in Past: No   Strengths: Pt is pleasant throughout the assessment.   Mental Health Symptoms Depression:  None   Duration of Depressive symptoms:    Mania:  None   Anxiety:    Restlessness; Irritability   Psychosis:  Hallucinations (Pt has a dx of Lewey Body Dementia)   Duration of Psychotic symptoms: Duration of Psychotic Symptoms: Less than six months (Pt has a dx of  Lewey Body Dementia)   Trauma:  None   Obsessions:  None   Compulsions:  None   Inattention:  Disorganized; Forgetful   Hyperactivity/Impulsivity:  Feeling of restlessness   Oppositional/Defiant Behaviors:  None   Emotional Irregularity:  None   Other Mood/Personality Symptoms:  NA    Mental Status Exam Appearance and self-care  Stature:  Average   Weight:  Average weight   Clothing:  Casual   Grooming:  Normal   Cosmetic use:  None   Posture/gait:  Slumped   Motor activity:  Not Remarkable   Sensorium  Attention:  Confused   Concentration:  Focuses on irrelevancies   Orientation:  Person   Recall/memory:  Defective in Immediate; Defective in Short-term; Defective in Recent; Defective in Remote   Affect and Mood  Affect:  Labile; Anxious   Mood:  Anxious   Relating  Eye contact:  Avoided   Facial expression:  Constricted   Attitude toward examiner:  Silly (pt observed laughing and smiling)   Thought and Language  Speech flow: Mute   Thought content:  Delusions   Preoccupation:  None   Hallucinations:  Auditory; Visual   Organization:  Disorganized   Company secretary of Knowledge:  Impoverished by (Comment) (Pt has a dx of Arboriculturist Dementia)   Intelligence:  Average (Pt has a dx of Lewey Body Dementia)   Abstraction:  Concrete (Pt has a dx of Lewey Body Dementia)   Judgement:  Impaired   Reality Testing:  Unaware   Insight:  None/zero insight; Poor   Decision Making:  Confused   Social Functioning  Social Maturity:  Impulsive (Pt has a dx of Arboriculturist Dementia)   Social Judgement:  -- (Pt has a dx of Lewey Body Dementia)   Stress  Stressors:  -- (Pt has a dx of Lewey Body Dementia)   Coping Ability:  Overwhelmed;  Exhausted   Skill Deficits:  Activities of daily living; Communication; Decision making; Self-care; Self-control   Supports:  Family     Religion: Religion/Spirituality Are You A Religious Person?: Yes What is Your Religious Affiliation?: Christian How Might This Affect Treatment?: N/a  Leisure/Recreation: Leisure / Recreation Do You Have Hobbies?: No  Exercise/Diet: Exercise/Diet Do You Exercise?: Yes What Type of Exercise Do You Do?:  (per wife, pt is mobile, but limited due to pain/weakness.) How Many Times a Week Do You Exercise?: Daily Have You Gained or Lost A Significant Amount of Weight in the Past Six Months?: No Do You Follow a Special Diet?: No Do You Have Any Trouble Sleeping?: Yes Explanation of Sleeping Difficulties: Wife reports lack of sleep - sundowning at early hours in the mornings.   CCA Employment/Education Employment/Work Situation: Employment / Work Situation Employment Situation: On  disability Why is Patient on Disability: Dementia and multiple past medical issues How Long has Patient Been on Disability: UTA Patient's Job has Been Impacted by Current Illness: No Has Patient ever Been in the Military?: No  Education: Education Is Patient Currently Attending School?: No Last Grade Completed: 12 Did You Attend College?: Yes What Type of College Degree Do you Have?: Technical College Did You Have An Individualized Education Program (IIEP): No Did You Have Any Difficulty At School?: No Patient's Education Has Been Impacted by Current Illness: No   CCA Family/Childhood History Family and Relationship History: Family history Marital status: Married Number of Years Married: 46 What types of issues is patient dealing with in the relationship?: none Additional relationship information: none Does patient have children?: Yes How many children?: 3 How is patient's relationship with their children?: Pt and wife live with their son, good relationship  with their adult children  Childhood History:  Childhood History By whom was/is the patient raised?: Other (Comment) (UTA - Lewey Body Dementia) Did patient suffer any verbal/emotional/physical/sexual abuse as a child?:  (uta - Lewey Body Dementia) Did patient suffer from severe childhood neglect?:  (uta - Lewey Body Dementia) Has patient ever been sexually abused/assaulted/raped as an adolescent or adult?:  (uta - Lewey Body Dementia) Was the patient ever a victim of a crime or a disaster?:  (uta - Lewey Body Dementia) Witnessed domestic violence?:  (uta - Lewey Body Dementia) Has patient been affected by domestic violence as an adult?:  (uta - Lewey Body Dementia)       CCA Substance Use Alcohol/Drug Use: Alcohol / Drug Use Pain Medications: SEE MAR Prescriptions: SEE MAR Over the Counter: SEE MAR History of alcohol / drug use?: No history of alcohol / drug abuse Longest period of sobriety (when/how long): N/A Negative Consequences of Use:  (N/A) Withdrawal Symptoms: None                         ASAM's:  Six Dimensions of Multidimensional Assessment  Dimension 1:  Acute Intoxication and/or Withdrawal Potential:      Dimension 2:  Biomedical Conditions and Complications:      Dimension 3:  Emotional, Behavioral, or Cognitive Conditions and Complications:     Dimension 4:  Readiness to Change:     Dimension 5:  Relapse, Continued use, or Continued Problem Potential:     Dimension 6:  Recovery/Living Environment:     ASAM Severity Score:    ASAM Recommended Level of Treatment:     Substance use Disorder (SUD)    Recommendations for Services/Supports/Treatments:    Disposition Recommendation per psychiatric provider: There are no psychiatric contraindications to discharge at this time   DSM5 Diagnoses: Patient Active Problem List   Diagnosis Date Noted   Hallucinations, visual 12/18/2022   Aortic atherosclerosis (HCC) 11/02/2021   Lumbar radiculopathy  10/19/2021   Cerebral vascular disease 10/19/2021   Complicated migraine 10/19/2021   Cognitive impairment 10/19/2021   Mild dementia with anxiety (HCC) 10/19/2021   Aphasia 09/06/2021   Headache 09/06/2021   Statin myopathy 04/05/2021   Atelectasis of both lungs 01/26/2018   Renal mass 01/26/2018   TIA (transient ischemic attack) 01/25/2018   Exertional dyspnea 09/23/2015   CAP (community acquired pneumonia) 09/23/2015   Lung nodule 09/23/2015   Cough 09/23/2015   Pneumonitis    Coronary artery disease of native artery of native heart with stable angina pectoris (HCC)    NSTEMI (non-ST elevated myocardial  infarction) (HCC)    Chest pain 09/10/2015   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   Osteopenia 01/20/2012   Hx of adenomatous colonic polyps 01/20/2012   GE reflux 01/20/2012   Vitamin D deficiency 01/20/2012   Low serum testosterone level 01/20/2012   Erectile dysfunction 01/20/2012   Impaired glucose tolerance 01/20/2012   Chronic back pain 01/20/2012   Depression 02/01/2011   ATTENTION OR CONCENTRATION DEFICIT 01/11/2010   Hyperlipidemia LDL goal <70 11/30/2008   Essential hypertension 11/30/2008   Osteoarthritis 11/30/2008     Referrals to Alternative Service(s): Referred to Alternative Service(s):   Place:   Date:   Time:    Referred to Alternative Service(s):   Place:   Date:   Time:    Referred to Alternative Service(s):   Place:   Date:   Time:    Referred to Alternative Service(s):   Place:   Date:   Time:     Audree Camel

## 2023-05-12 NOTE — Discharge Instructions (Signed)
Continue your prescribed medications and follow up with your psychiatric providers for any recommendations on changes to this regimen. Our social work and care management team should be reaching out to you to discuss options for additional support and/or placement. Follow up with your primary care doctor in the interim as needed. Return should symptoms progress or worsen.

## 2023-05-12 NOTE — BH Assessment (Signed)
TTS clinician sent a message to the ED care team at [phone] inquiring if the patient is able to participate in a TTS tele assessment at this time. Awaiting Response.

## 2023-05-12 NOTE — ED Provider Notes (Signed)
[place] EMERGENCY DEPARTMENT AT [facility] Provider Note   CSN: [id number] Arrival date & time: 05/12/23  1256     History {Add pertinent medical, surgical, social history, OB history to HPI:1} Chief Complaint  Patient presents with   Dementia    EVALD VALASEK is a 72 y.o. male.  72 year old male with history of hypertension, hyperlipidemia, and Lewy body dementia who presents to the emergency department with agitation.  Patient was here yesterday after his wife reported that he has been starting to become agitated and paranoid.  He had a CT of the head, urinalysis, CBC, CMP, Tylenol and salicylate levels that were unremarkable.  Was seen by psychiatry and was cleared.  This was thought to be due to Lewy body dementia.  Has had hydroxyzine increased recently for his agitation.  Wife reports since going home he tried to leave and then has also been agitated and she is now concern for her safety.  Says that he does not take any medicines aside from the hydroxyzine for his behavior and would like to start him on something.  Has some concerns about placing him in a memory care unit due to cost.       Home Medications Prior to Admission medications   Medication Sig Start Date End Date Taking? Authorizing Provider  butalbital-acetaminophen-caffeine (FIORICET) 50-325-40 MG tablet Take 1 tablet by mouth every 6 (six) hours as needed for headache. 07/31/22   Glean Salvo, NP  gabapentin (NEURONTIN) 300 MG capsule Take 1 capsule (300 mg total) by mouth at bedtime. 02/06/23   Glean Salvo, NP  HYDROcodone-acetaminophen (NORCO) 10-325 MG tablet Take 1 tablet by mouth every 4 hours as needed for pain. 11/02/22     hydrOXYzine (VISTARIL) 25 MG capsule Take 25 mg by mouth 2 (two) times daily as needed for anxiety. 05/01/23   [provider]  Melatonin 5 MG CAPS Take 4 capsules by mouth at bedtime.    [provider]  nitroGLYCERIN (NITROSTAT) 0.4 MG SL tablet  Place 1 tablet (0.4 mg total) under the tongue every 5 (five) minutes as needed for chest pain. 11/03/21   Tereso Newcomer T, PA-C  sucralfate (CARAFATE) 1 g tablet Take 1 g by mouth 2 (two) times daily as needed. [place] 03/07/22   [provider]  vitamin B-12 (CYANOCOBALAMIN) 500 MCG tablet Take 1 tablet (500 mcg total) by mouth daily. 09/07/21   Elgergawy, Leana Roe, MD      Allergies    Lipitor [atorvastatin], Praluent [alirocumab], Crestor [rosuvastatin], Imdur [isosorbide nitrate], Morphine, Hydrocodone, Levaquin [levofloxacin], Metadate cd [methylphenidate hcl], Morphine and codeine, and Pravachol [pravastatin]    Review of Systems   Review of Systems  Physical Exam Updated Vital Signs BP (!) 171/107   Pulse 89   Temp 97.8 F (36.6 C) (Oral)   Resp 16   Ht 6' (1.829 m)   Wt 79.2 kg   SpO2 98%   BMI 23.68 kg/m  Physical Exam Vitals and nursing note reviewed.  Constitutional:      General: He is not in acute distress.    Appearance: He is well-developed.  HENT:     Head: Normocephalic and atraumatic.     Right Ear: External ear normal.     Left Ear: External ear normal.     Nose: Nose normal.  Eyes:     Extraocular Movements: Extraocular movements intact.     Conjunctiva/sclera: Conjunctivae normal.     Pupils: Pupils are equal, round,  and reactive to light.  Pulmonary:     Effort: Pulmonary effort is normal. No respiratory distress.  Musculoskeletal:     Cervical back: Normal range of motion and neck supple.     Right lower leg: No edema.     Left lower leg: No edema.  Skin:    General: Skin is warm and dry.  Neurological:     General: No focal deficit present.     Mental Status: He is alert. Mental status is at baseline.     Motor: No weakness.  Psychiatric:        Mood and Affect: Mood normal.        Behavior: Behavior normal.     ED Results / Procedures / Treatments   Labs (all labs ordered are listed, but only abnormal results are  displayed) Labs Reviewed  COMPREHENSIVE METABOLIC PANEL - Abnormal; Notable for the following components:      Result Value   AST 14 (*)    Total Bilirubin 1.7 (*)    All other components within normal limits  CBC WITH DIFFERENTIAL/PLATELET  ETHANOL  RAPID URINE DRUG SCREEN, HOSP PERFORMED    EKG None  Radiology CT HEAD WO CONTRAST ( ) Result Date: 05/12/2023 CLINICAL DATA:  Mental status change, unknown cause. Hallucinations. EXAM: CT HEAD WITHOUT CONTRAST TECHNIQUE: Contiguous axial images were obtained from the base of the skull through the vertex without intravenous contrast. RADIATION DOSE REDUCTION: This exam was performed according to the departmental dose-optimization program which includes automated exposure control, adjustment of the mA and/or kV according to patient size and/or use of iterative reconstruction technique. COMPARISON:  12/17/2022 FINDINGS: Brain: There is atrophy and chronic small vessel disease changes. No acute intracranial abnormality. Specifically, no hemorrhage, hydrocephalus, mass lesion, acute infarction, or significant intracranial injury. Vascular: No hyperdense vessel or unexpected calcification. Skull: No acute calvarial abnormality. Sinuses/Orbits: No acute findings Other: None IMPRESSION: Atrophy, chronic microvascular disease. No acute intracranial abnormality. Electronically Signed   By: Charlett Nose M.D.   On: 05/12/2023 00:44    Procedures Procedures  {Document cardiac monitor, telemetry assessment procedure when appropriate:1}  Medications Ordered in ED Medications - No data to display  ED Course/ Medical Decision Making/ A&P   {   Click here for ABCD2, HEART and other calculatorsREFRESH Note before signing :1}                              Medical Decision Making  ***  {Document critical care time when appropriate:1} {Document review of labs and clinical decision tools ie heart score, Chads2Vasc2 etc:1}  {Document your independent  review of radiology images, and any outside records:1} {Document your discussion with family members, caretakers, and with consultants:1} {Document social determinants of health affecting pt's care:1} {Document your decision making why or why not admission, treatments were needed:1} Final Clinical Impression(s) / ED Diagnoses Final diagnoses:  None    Rx / DC Orders ED Discharge Orders     None

## 2023-05-12 NOTE — ED Triage Notes (Signed)
Pt BIB son. Pt has dementia and is becoming aggressive toward wife at home. Pt thinking people are breaking into house. Family is concerned for safety of wife. Pt was seen in ER yesterday and family was advised to bring him back if worsening behavior. Pt alert to self and place. Put unsure of situation or date.

## 2023-05-13 MED ORDER — RISPERIDONE 0.5 MG PO TBDP: 0.5000 mg | ORAL_TABLET | Freq: Two times a day (BID) | ORAL | 2 refills | Status: DC | PRN

## 2023-05-13 NOTE — ED Notes (Signed)
Family member at bedside.

## 2023-05-13 NOTE — Evaluation (Signed)
Physical Therapy Evaluation Patient Details Name: Johnny [name] MRN: [id number] DOB: 23-Nov-1950 Today's Date: 05/13/2023  History of Present Illness  72 y.o. male presents to [facility] hospital on 05/12/23 with worsening agitation/ confusion/ hallucinations. Had a fall several weeks ago. CT head without acute abnormality.  PMH includes HTN, HLD, CAD s/p MI, migraines, sciatica, Lewy body dementia.  Clinical Impression  Pt admitted with above diagnosis. Pt from home with wife and has been combative and progressively more difficult to handle. Pt and wife had been living with son and wife reports that was better and plan will be to go back there. That being said, wife has a h/o back issues as well as MS and son has had back surgery and it awaiting another. Pt needs tactile cues to moblilize, follows verbal cues intermittently, often confused by tasks. At times has difficulty engaging musculature for function and at other times shows great one time strength which is a problem when pt becomes combative at home. Pt ambulated 100' with rollator and min A to direct. Recommend HHPT and OT was recommended as well.  Pt currently with functional limitations due to the deficits listed below (see PT Problem List). Pt will benefit from acute skilled PT to increase their independence and safety with mobility to allow discharge.           If plan is discharge home, recommend the following: A little help with walking and/or transfers;A little help with bathing/dressing/bathroom;Assistance with cooking/housework;Direct supervision/assist for medications management;Direct supervision/assist for financial management;Assist for transportation;Help with stairs or ramp for entrance;Supervision due to cognitive status   Can travel by private vehicle        Equipment Recommendations None recommended by PT  Recommendations for Other Services  OT consult    Functional Status Assessment Patient has had a recent decline in  their functional status and demonstrates the ability to make significant improvements in function in a reasonable and predictable amount of time.     Precautions / Restrictions Precautions Precautions: Fall Restrictions Weight Bearing Restrictions Per Provider Order: No      Mobility  Bed Mobility Overal bed mobility: Needs Assistance Bed Mobility: Supine to Sit, Sit to Supine     Supine to sit: Min assist Sit to supine: Min assist   General bed mobility comments: pt able to get in and out of bed but needs tactile cues to lie down to the correct side of the bed and to come up to EOB.    Transfers Overall transfer level: Needs assistance Equipment used: Rollator (4 wheels) Transfers: Sit to/from Stand Sit to Stand: Contact guard assist           General transfer comment: tactile cues for sit>stand    Ambulation/Gait Ambulation/Gait assistance: Min assist Gait Distance (Feet): 100 Feet Assistive device: Rollator (4 wheels) Gait Pattern/deviations: Step-through pattern, Trunk flexed Gait velocity: decreased Gait velocity interpretation: 1.31 - 2.62 ft/sec, indicative of limited community ambulator   General Gait Details: needed manual guiding of rollator at times for obstacle navigation and changing directions. Pt occasionally seeing things on floor (that aren't there) and stepping over them or around. Easily distracted decreasing his safety  Stairs            Wheelchair Mobility     Tilt Bed    Modified Rankin (Stroke Patients Only)       Balance Overall balance assessment: Needs assistance Sitting-balance support: Feet supported Sitting balance-Leahy Scale: Good     Standing balance support: During  functional activity, Reliant on assistive device for balance Standing balance-Leahy Scale: Poor Standing balance comment: has fallen at home, decreased attention to task. Needs close guarding for safety                              Pertinent Vitals/Pain Pain Assessment Pain Assessment: Faces Faces Pain Scale: Hurts little more Pain Location: back Pain Descriptors / Indicators: Discomfort Pain Intervention(s): Limited activity within patient's tolerance, Monitored during session    Home Living Family/patient expects to be discharged to:: Private residence Living Arrangements: Spouse/significant other;Children Available Help at Discharge: Family;Available 24 hours/day Type of Home: House Home Access: Ramped entrance     Alternate Level Stairs-Number of Steps: flight Home Layout: Two level Home Equipment: Rolling Walker (2 wheels);Hospital bed;BSC/3in1;Shower seat;Wheelchair - Physiological scientist (4 wheels) Additional Comments: was living with son and then went back home and wife reports things have been worse there. Plan to go back to son's    Prior Function Prior Level of Function : Needs assist             Mobility Comments: ambulates with rollator out of house, often no AD in home ADLs Comments: assist for all ADLs     Extremity/Trunk Assessment   Upper Extremity Assessment Upper Extremity Assessment: Generalized weakness (at times pt with generalized weakness due to decreased ability to recruit musculature as desired but at times pt has great one time strength which makes things dangerous for his wife)    Lower Extremity Assessment Lower Extremity Assessment: Generalized weakness (due to chronic low back pain)    Cervical / Trunk Assessment Cervical / Trunk Assessment: Kyphotic  Communication   Communication Communication: No apparent difficulties Cueing Techniques: Verbal cues;Gestural cues  Cognition Arousal: Alert Behavior During Therapy: WFL for tasks assessed/performed Overall Cognitive Status: History of cognitive impairments - at baseline                                 General Comments: hx of lewy body dementia, needs step by step cues to perform basic mobility.  Not combative today but has been at home, esp towards wife. Hallucinations have been getting worse.        General Comments General comments (skin integrity, edema, etc.): wife needs increased support in the home. Pt does well working with therapies. VSS.    Exercises     Assessment/Plan    PT Assessment Patient needs continued PT services  PT Problem List Decreased activity tolerance;Decreased balance;Decreased mobility;Decreased coordination;Decreased cognition;Decreased knowledge of use of DME;Decreased safety awareness;Decreased knowledge of precautions;Pain       PT Treatment Interventions DME instruction;Gait training;Stair training;Functional mobility training;Therapeutic activities;Therapeutic exercise;Balance training;Neuromuscular re-education;Cognitive remediation;Patient/family education    PT Goals (Current goals can be found in the Care Plan section)  Acute Rehab PT Goals Patient Stated Goal: return home, get more help in the home PT Goal Formulation: With family Time For Goal Achievement: 05/27/23 Potential to Achieve Goals: Fair    Frequency Min 1X/week     Co-evaluation               AM-PAC PT "6 Clicks" Mobility  Outcome Measure Help needed turning from your back to your side while in a flat bed without using bedrails?: A Little Help needed moving from lying on your back to sitting on the side of a flat bed without using bedrails?: A Little  Help needed moving to and from a bed to a chair (including a wheelchair)?: A Little Help needed standing up from a chair using your arms (e.g., wheelchair or bedside chair)?: A Little Help needed to walk in hospital room?: A Little Help needed climbing 3-5 steps with a railing? : A Little 6 Click Score: 18    End of Session   Activity Tolerance: Patient tolerated treatment well Patient left: in bed;with call bell/phone within reach;with family/visitor present Nurse Communication: Mobility status PT Visit  Diagnosis: Unsteadiness on feet (R26.81);History of falling (Z91.81);Muscle weakness (generalized) (M62.81);Pain Pain - part of body:  (back)    Time: 4132-4401 PT Time Calculation (min) (ACUTE ONLY): 22 min   Charges:   PT Evaluation $PT Eval Moderate Complexity: 1 Mod   PT General Charges $$ ACUTE PT VISIT: 1 Visit         Lyanne Co, PT  Acute Rehab Services Secure chat preferred Office [phone]   Lawana Chambers Janelli Welling 05/13/2023, 1:02 PM

## 2023-05-13 NOTE — Progress Notes (Signed)
OT Cancellation Note  Patient Details Name: Johnny [name] MRN: [id number] DOB: 10/19/1950   Cancelled Treatment:    Reason Eval/Treat Not Completed: Other (comment) (New OT evaluation order recieved, pt evaluated 12/22. Please see OT note from 12/22 for further details)  Carver Fila, OTD, OTR/L SecureChat Preferred Acute Rehab [phone]   Dalphine Handing 05/13/2023, 2:37 PM

## 2023-05-13 NOTE — ED Notes (Addendum)
PT at BS.

## 2023-05-13 NOTE — Care Management (Signed)
ED RNCM consulted concerning HHPT recommendations.  Spoke with patient's son Jencarlos Ply. who is agreeable.  Discussed HH servies and limitations with companies due to holiday.  No preferences mentioned. [facility] accepted the referral. Updated son on the time frame for opening the case may not be until the weekend, and he is agreeable. Information placed on AVS.

## 2023-05-13 NOTE — ED Notes (Addendum)
Attempted PO meds for agitation and pain, unable

## 2023-05-13 NOTE — ED Notes (Signed)
Pt agitated, restless, fidgety, worried about BP, pacing b/w bed, recliner and out of room multiple times, family arriving to visit.

## 2023-05-13 NOTE — ED Notes (Signed)
Pt/family d/w CM/SW [facility] resources. AVS updated/ given.

## 2023-05-13 NOTE — ED Provider Notes (Addendum)
Emergency Medicine Observation Re-evaluation Note  Johnny [name] is a 72 y.o. male, seen on rounds today.  Pt initially presented to the ED for complaints of Dementia Currently, the patient is sitting in bed.  Physical Exam  BP (!) 145/89   Pulse 65   Temp 97.6 F (36.4 C) (Oral)   Resp 18   Ht 6' (1.829 m)   Wt 79.2 kg   SpO2 98%   BMI 23.68 kg/m  Physical Exam General: Awake, alert, nondistressed Cardiac: Extremities well-perfused Lungs: Breathing is unlabored Psych: No agitation  ED Course / MDM  EKG:   I have reviewed the labs performed to date as well as medications administered while in observation.  Recent changes in the last 24 hours include presentation to the emergency department yesterday for paranoia and aggressive behavior to family.  He was seen the previous night for the same and was psych cleared at that time.  TOC has been consulted.  Patient's family stated that they would like to take him home.  Social work was made aware.  Plan  Current plan is for discharge.    Gloris Manchester, MD 05/13/23 Theodis Blaze    Gloris Manchester, MD 05/13/23 [phone]

## 2023-05-13 NOTE — ED Notes (Signed)
Pt received breakfast tray 

## 2023-05-13 NOTE — ED Notes (Addendum)
Wife and son at [facility]. Pt working with PT, ambulating out of room in common area with minimal assist. Walking with and w/o walker.

## 2023-05-13 NOTE — Progress Notes (Signed)
CSW spoke with Annabelle Harman, RN regarding patient. Annabelle Harman states she spoke with patient's son Luella Cook who is requesting patient be discharged home with family.  CSW spoke with Luella Cook who is requesting his dad be discharged home with his family. Gala Romney did not express any additional concerns or needs at this time.  CSW notified Dr. Durwin Nora of information.  Edwin Dada, MSW, LCSW Transitions of Care  Clinical Social Worker II [phone]

## 2023-05-13 NOTE — ED Notes (Signed)
Pt remains restless, flatly agitated w/o agression, fidgety, wife at [facility].

## 2023-05-13 NOTE — Discharge Instructions (Addendum)
Continue home medications as prescribed.  Follow-up with your neurologist and PCP.  Return to the emergency department for any new or worsening symptoms of concern.

## 2023-05-17 ENCOUNTER — Ambulatory Visit: Payer: Medicare Other | Admitting: Internal Medicine

## 2023-06-22 DEATH — deceased

## 2023-07-15 ENCOUNTER — Other Ambulatory Visit: Payer: Medicare Other

## 2023-07-16 ENCOUNTER — Other Ambulatory Visit: Payer: Medicare Other

## 2023-07-18 ENCOUNTER — Ambulatory Visit: Payer: Medicare Other | Admitting: Internal Medicine

## 2023-08-13 ENCOUNTER — Ambulatory Visit: Payer: Medicare Other | Admitting: Neurology

## 2023-09-09 ENCOUNTER — Encounter: Payer: Medicare Other | Admitting: Psychology

## 2024-04-09 ENCOUNTER — Other Ambulatory Visit (HOSPITAL_COMMUNITY): Payer: Self-pay

## 2024-04-21 ENCOUNTER — Other Ambulatory Visit: Payer: Self-pay

## 2024-04-27 ENCOUNTER — Other Ambulatory Visit: Payer: Self-pay

## 2024-06-15 ENCOUNTER — Other Ambulatory Visit (HOSPITAL_COMMUNITY): Payer: Self-pay
